# Patient Record
Sex: Female | Born: 1955 | Race: Black or African American | Hispanic: No | Marital: Married | Smoking: Current some day smoker
Health system: Southern US, Community
[De-identification: ages and names within clinical notes are randomized; demographics above are authoritative.]

## PROBLEM LIST (undated history)

## (undated) DIAGNOSIS — C801 Malignant (primary) neoplasm, unspecified: Secondary | ICD-10-CM

## (undated) DIAGNOSIS — C3490 Malignant neoplasm of unspecified part of unspecified bronchus or lung: Secondary | ICD-10-CM

## (undated) HISTORY — PX: ECTOPIC PREGNANCY SURGERY: SHX613

---

## 2004-07-10 ENCOUNTER — Emergency Department (HOSPITAL_COMMUNITY): Admission: EM | Admit: 2004-07-10 | Discharge: 2004-07-10 | Payer: Self-pay | Admitting: Emergency Medicine
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
## 2008-10-11 ENCOUNTER — Emergency Department (HOSPITAL_COMMUNITY): Admission: EM | Admit: 2008-10-11 | Discharge: 2008-10-11 | Payer: Self-pay | Admitting: Emergency Medicine

## 2008-10-13 ENCOUNTER — Emergency Department (HOSPITAL_COMMUNITY): Admission: EM | Admit: 2008-10-13 | Discharge: 2008-10-13 | Payer: Self-pay | Admitting: Emergency Medicine

## 2008-10-14 ENCOUNTER — Emergency Department (HOSPITAL_COMMUNITY): Admission: EM | Admit: 2008-10-14 | Discharge: 2008-10-14 | Payer: Self-pay | Admitting: Emergency Medicine

## 2010-03-28 ENCOUNTER — Emergency Department (HOSPITAL_COMMUNITY)
Admission: EM | Admit: 2010-03-28 | Discharge: 2010-03-28 | Payer: Self-pay | Source: Home / Self Care | Admitting: Emergency Medicine

## 2010-03-28 IMAGING — CR DG SHOULDER 2+V*L*
3 series · 3 of 3 positions shown · non-contrast
Comparison: None.

CLINICAL DATA: Left shoulder pain, fall

LEFT SHOULDER - 2+ VIEW

[view not recorded (1 of 3)]
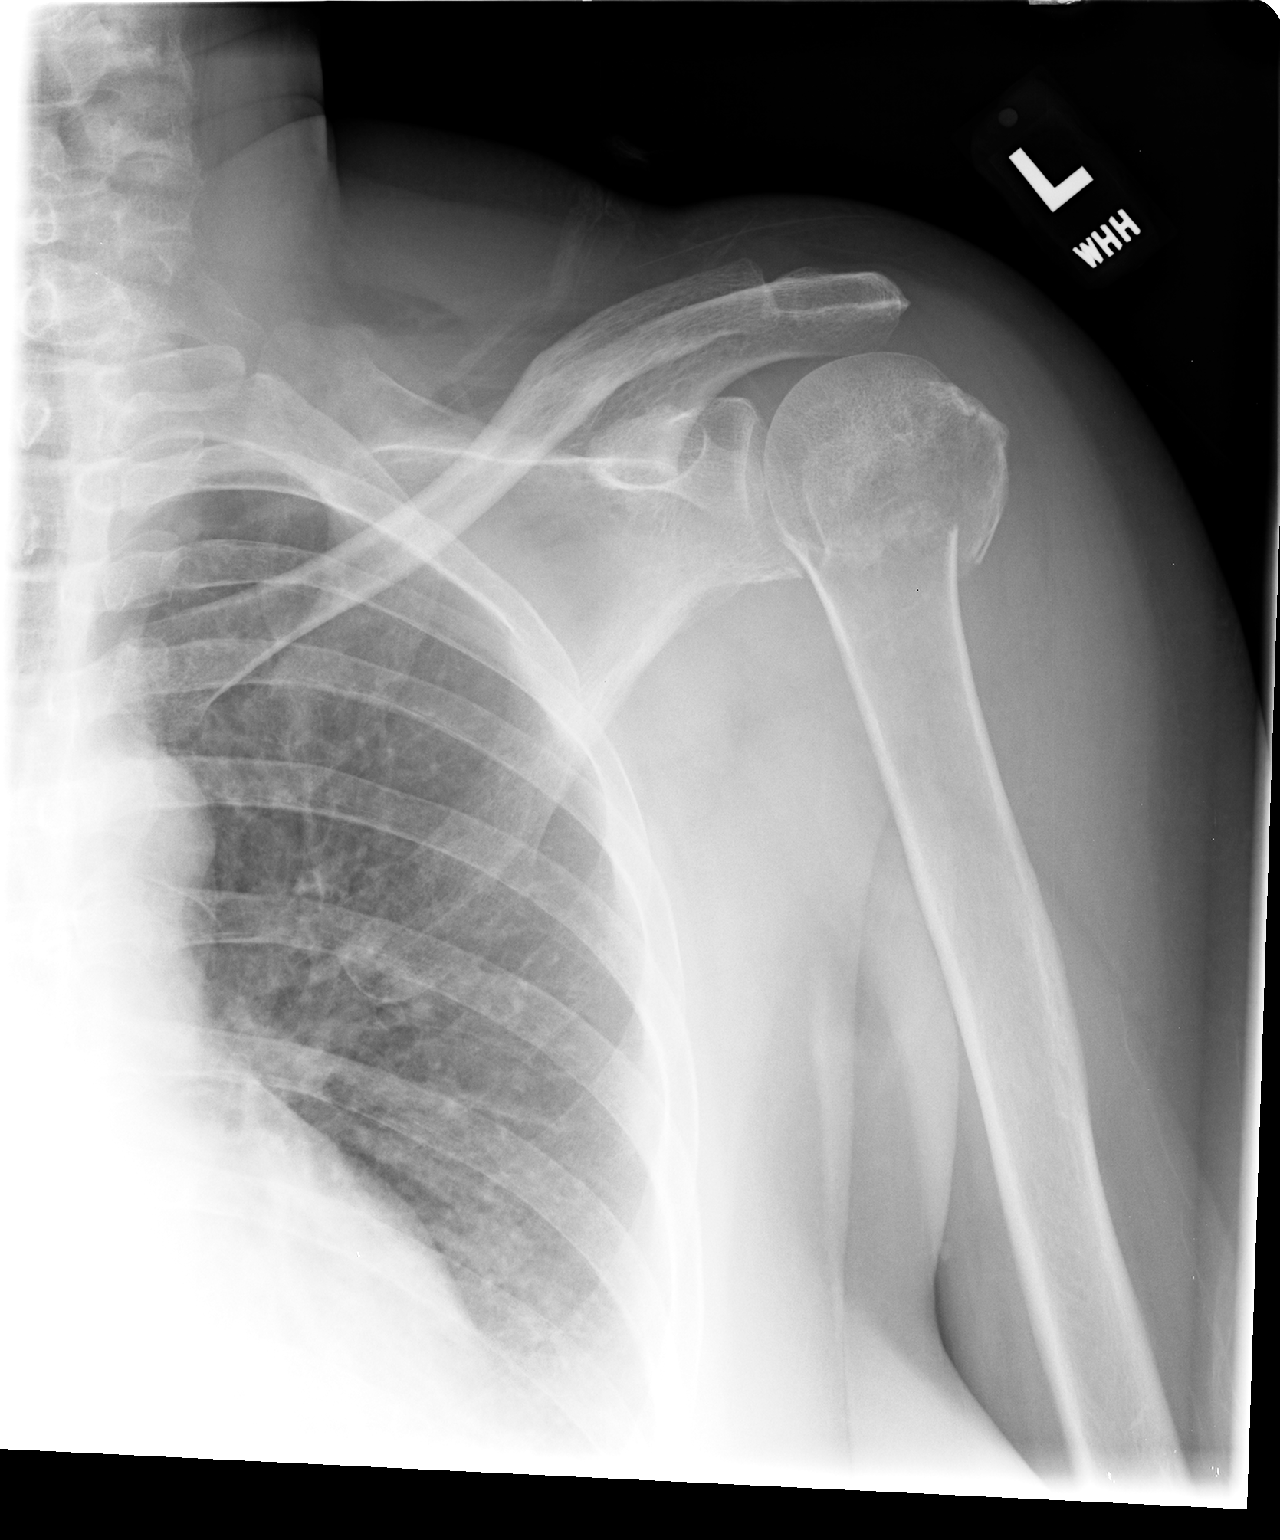

[view not recorded (2 of 3)]
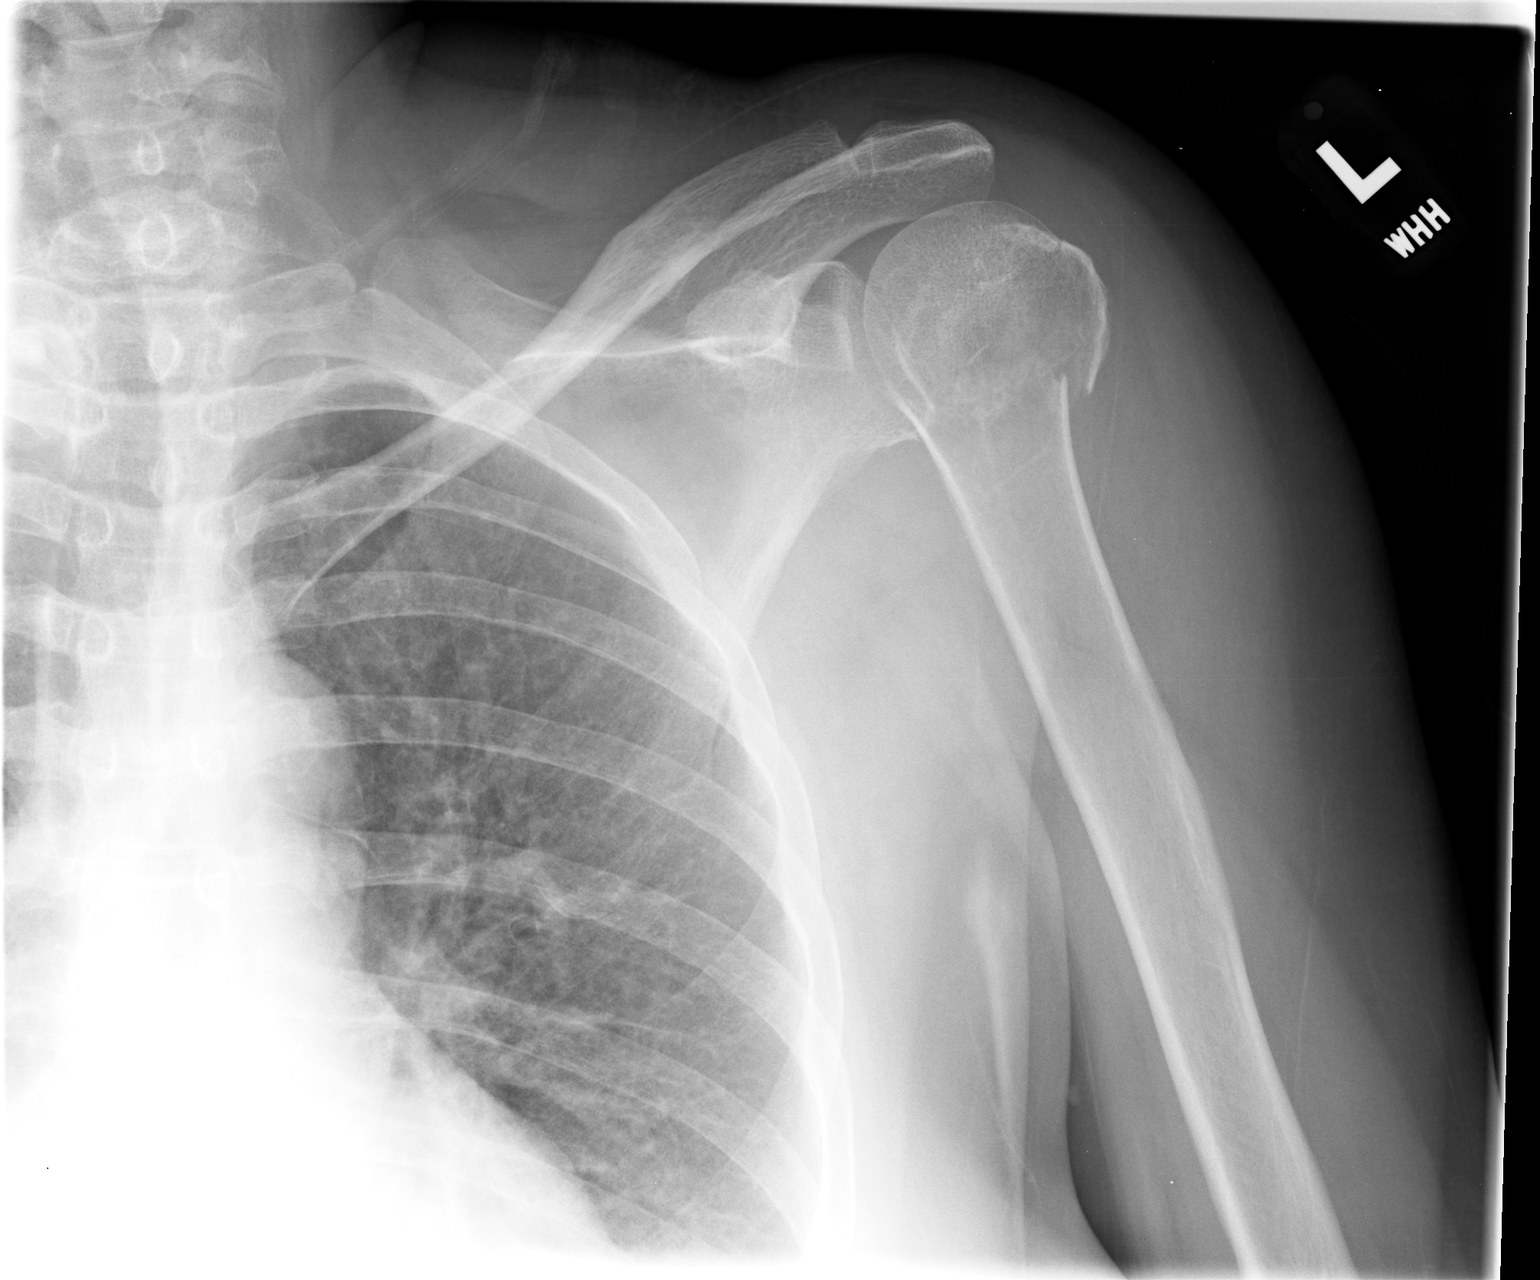

[view not recorded (3 of 3)]
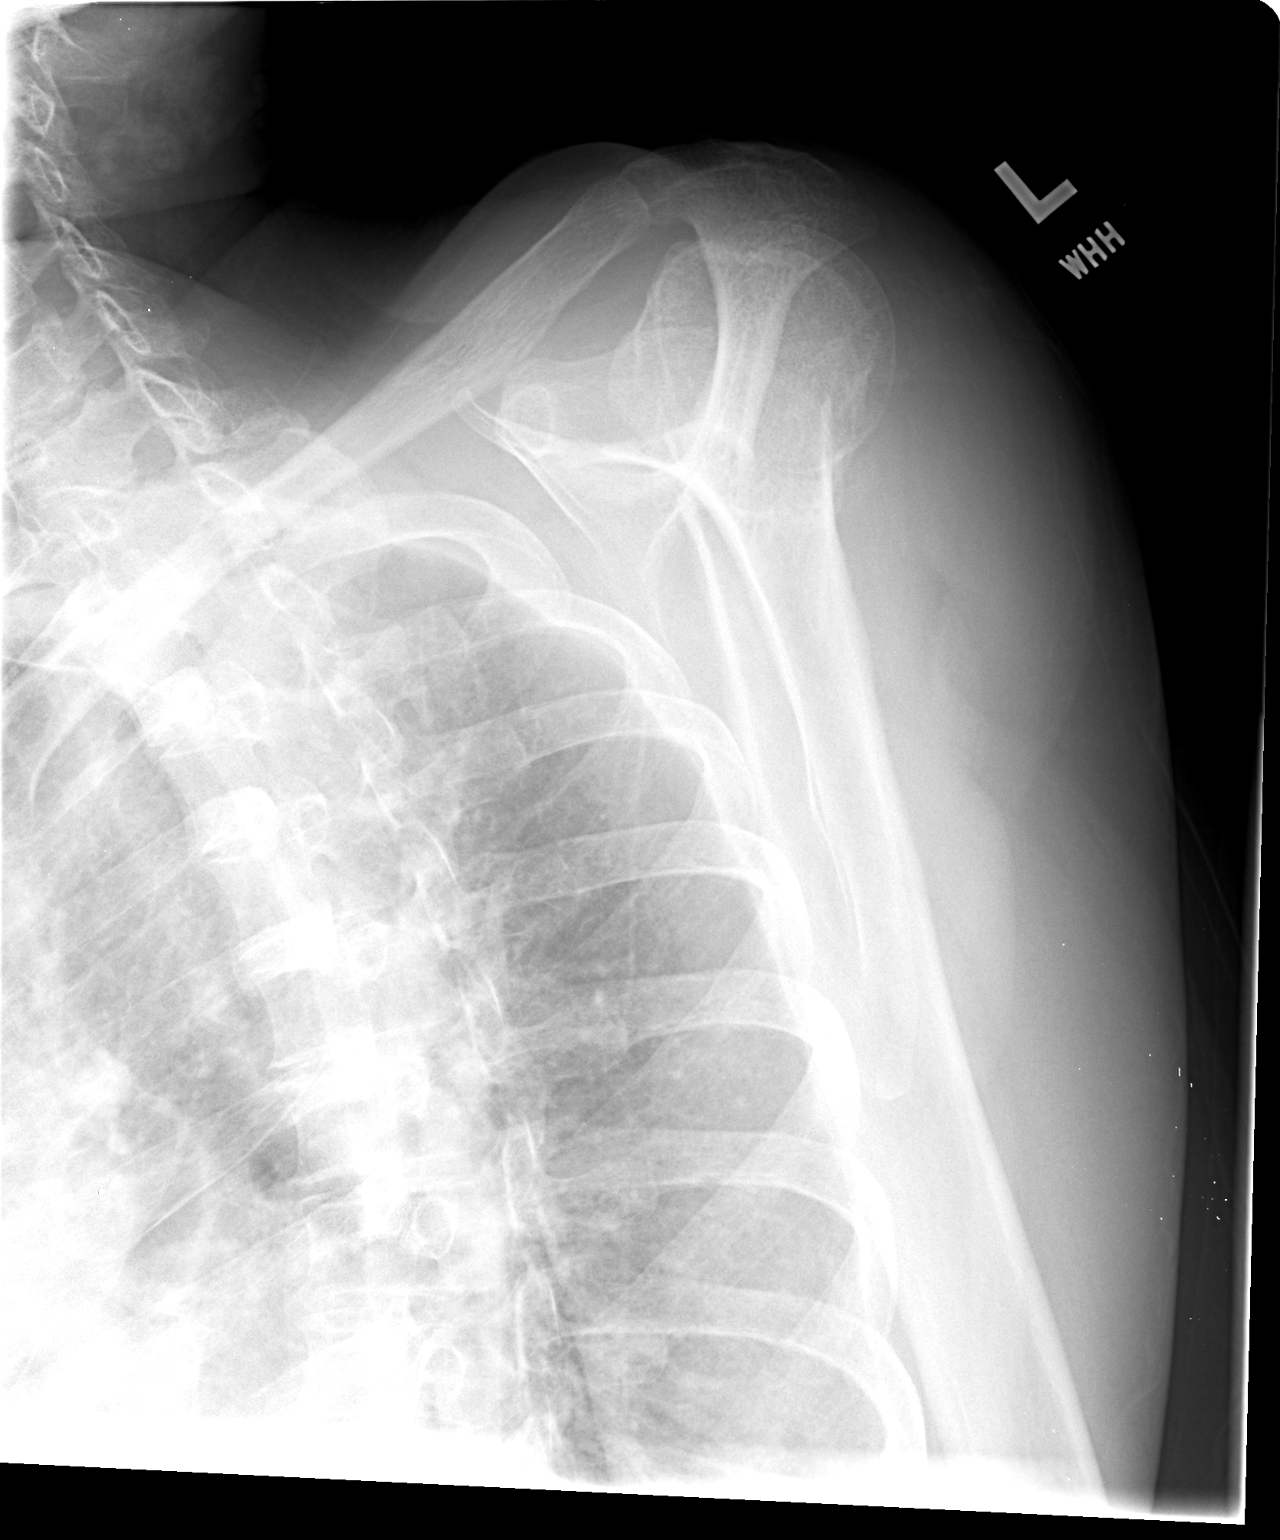

[3 of 3 positions shown; findings below may reference images not displayed]

FINDINGS: Left humeral neck fracture is noted without dislocation
or appreciable angulation.  Left AC joint distance is normal.
IMPRESSION: Left humeral neck fracture.

## 2010-03-30 ENCOUNTER — Emergency Department (HOSPITAL_COMMUNITY)
Admission: EM | Admit: 2010-03-30 | Discharge: 2010-03-30 | Payer: Self-pay | Source: Home / Self Care | Admitting: Emergency Medicine

## 2010-04-01 ENCOUNTER — Encounter: Payer: Self-pay | Admitting: Orthopedic Surgery

## 2010-04-01 ENCOUNTER — Ambulatory Visit
Admission: RE | Admit: 2010-04-01 | Discharge: 2010-04-01 | Payer: Self-pay | Source: Home / Self Care | Attending: Orthopedic Surgery | Admitting: Orthopedic Surgery

## 2010-04-01 DIAGNOSIS — S42309A Unspecified fracture of shaft of humerus, unspecified arm, initial encounter for closed fracture: Secondary | ICD-10-CM | POA: Insufficient documentation

## 2010-04-07 ENCOUNTER — Telehealth: Payer: Self-pay | Admitting: Orthopedic Surgery

## 2010-04-21 ENCOUNTER — Ambulatory Visit: Admit: 2010-04-21 | Payer: Self-pay | Admitting: Orthopedic Surgery

## 2010-04-28 ENCOUNTER — Telehealth: Payer: Self-pay | Admitting: Orthopedic Surgery

## 2010-04-29 ENCOUNTER — Other Ambulatory Visit: Payer: Self-pay | Admitting: Orthopedic Surgery

## 2010-04-29 ENCOUNTER — Encounter: Payer: Self-pay | Admitting: Orthopedic Surgery

## 2010-04-29 DIAGNOSIS — S42309A Unspecified fracture of shaft of humerus, unspecified arm, initial encounter for closed fracture: Secondary | ICD-10-CM

## 2010-04-30 NOTE — Progress Notes (Signed)
Summary: wants stronger pain med  Phone Note Call from Patient   Summary of Call: Sandra Horne ([id number]) says the Oxycodone 5/325 is not helping her pain.  She sometimes takes 2-3  and it relaxes her but does not completely stop the pain.  Wants you to prescribe something stronger.  Uses [facility] in [place] Her # [phone] Initial call taken by: Jacklynn Ganong,  April 07, 2010 11:46 AM  Follow-up for Phone Call        it can be changed but not increased  Follow-up by: Fuller Canada MD,  April 07, 2010 12:05 PM  Additional Follow-up for Phone Call Additional follow up Details #1::        She said she would continue taking what she has  if she cannot get something stronger Additional Follow-up by: Jacklynn Ganong,  April 07, 2010 12:23 PM
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
## 2010-04-30 NOTE — Letter (Signed)
Summary: History form  History form   Imported By: Jacklynn Ganong 04/02/2010 16:28:35  _____________________________________________________________________  External Attachment:    Type:   Image     Comment:   External Document

## 2010-05-06 NOTE — Miscellaneous (Signed)
Summary: PT order  Clinical Lists Changes  Orders: Added new Referral order of Physical Therapy Referral (PT) - Signed

## 2010-05-06 NOTE — Miscellaneous (Signed)
  Clinical Lists Changes  post frx week 4   left prox hum fracture   xrays 2 views left shoulder   Separate and Identifiable X-Ray report       healed proximal humerus fracture    Start PT   return in a month    [id number] value> Orders: Added new Service order of Post-Op Check [phone]) - Signed Added new Service order of Shoulder x-ray,  minimum 2 views (19147) - Signed

## 2010-05-06 NOTE — Progress Notes (Signed)
Summary: patient is re-scheduled for appt+approved for [facility] discount   Phone Note Call from Patient   Caller: Patient Summary of Call: Patient called and stopped in with letter from [facility] stating that she has been issued the 100% [place] discount per [facility] counseling dept, per Marisa Hua.  She requests re-schedule of her 04/01/10 appt, for which she did not come due to financial and transportation issues; had been advised to re-schedule asap as needs to fol/up and have new Xrays. She is re-scheduled for appointment;  letter scanned into chart. Initial call taken by: Cammie Sickle,  April 28, 2010 3:01 PM

## 2010-06-02 ENCOUNTER — Other Ambulatory Visit: Payer: Self-pay | Admitting: Orthopedic Surgery

## 2010-07-07 ENCOUNTER — Ambulatory Visit: Payer: Self-pay | Admitting: Orthopedic Surgery

## 2010-07-07 ENCOUNTER — Encounter: Payer: Self-pay | Admitting: Orthopedic Surgery

## 2010-08-03 ENCOUNTER — Emergency Department (HOSPITAL_COMMUNITY)
Admission: EM | Admit: 2010-08-03 | Discharge: 2010-08-03 | Disposition: A | Discharge status: Home / Self Care | Payer: Self-pay | Attending: Emergency Medicine | Admitting: Emergency Medicine

## 2010-08-03 ENCOUNTER — Emergency Department (HOSPITAL_COMMUNITY): Payer: Self-pay

## 2010-08-03 DIAGNOSIS — S42253A Displaced fracture of greater tuberosity of unspecified humerus, initial encounter for closed fracture: Secondary | ICD-10-CM | POA: Insufficient documentation

## 2010-08-03 DIAGNOSIS — Y93K1 Activity, walking an animal: Secondary | ICD-10-CM | POA: Insufficient documentation

## 2010-08-03 DIAGNOSIS — W010XXA Fall on same level from slipping, tripping and stumbling without subsequent striking against object, initial encounter: Secondary | ICD-10-CM | POA: Insufficient documentation

## 2012-04-23 ENCOUNTER — Emergency Department (HOSPITAL_COMMUNITY): Payer: No Typology Code available for payment source

## 2012-04-23 ENCOUNTER — Encounter (HOSPITAL_COMMUNITY): Payer: Self-pay | Admitting: *Deleted

## 2012-04-23 ENCOUNTER — Emergency Department (HOSPITAL_COMMUNITY)
Admission: EM | Admit: 2012-04-23 | Discharge: 2012-04-23 | Disposition: A | Discharge status: Home / Self Care | Payer: No Typology Code available for payment source | Attending: Emergency Medicine | Admitting: Emergency Medicine

## 2012-04-23 DIAGNOSIS — R55 Syncope and collapse: Secondary | ICD-10-CM | POA: Insufficient documentation

## 2012-04-23 DIAGNOSIS — S0003XA Contusion of scalp, initial encounter: Secondary | ICD-10-CM | POA: Insufficient documentation

## 2012-04-23 DIAGNOSIS — S00531A Contusion of lip, initial encounter: Secondary | ICD-10-CM

## 2012-04-23 DIAGNOSIS — S058X9A Other injuries of unspecified eye and orbit, initial encounter: Secondary | ICD-10-CM | POA: Insufficient documentation

## 2012-04-23 DIAGNOSIS — S0500XA Injury of conjunctiva and corneal abrasion without foreign body, unspecified eye, initial encounter: Secondary | ICD-10-CM

## 2012-04-23 DIAGNOSIS — Y9389 Activity, other specified: Secondary | ICD-10-CM | POA: Insufficient documentation

## 2012-04-23 DIAGNOSIS — R079 Chest pain, unspecified: Secondary | ICD-10-CM | POA: Insufficient documentation

## 2012-04-23 LAB — BASIC METABOLIC PANEL
BUN: 10 mg/dL (ref 6–23)
Calcium: 9.4 mg/dL (ref 8.4–10.5)
GFR calc Af Amer: >90 mL/min (ref >90)
GFR calc non Af Amer: >90 mL/min (ref >90)
Glucose, Bld: 108 mg/dL — ABNORMAL HIGH (ref 70–99)
Potassium: 3.9 mEq/L (ref 3.5–5.1)
Sodium: 137 mEq/L (ref 135–145)

## 2012-04-23 LAB — CBC WITH DIFFERENTIAL/PLATELET
Basophils Relative: 1 % (ref 0–1)
Eosinophils Absolute: 0.2 10*3/uL (ref 0.0–0.7)
Eosinophils Relative: 3 % (ref 0–5)
MCH: 30.2 pg (ref 26.0–34.0)
MCHC: 33.6 g/dL (ref 30.0–36.0)
MCV: 90 fL (ref 78.0–100.0)
Neutrophils Relative %: 48 % (ref 43–77)
Platelets: 372 10*3/uL (ref 150–400)
RDW: 14.5 % (ref 11.5–15.5)

## 2012-04-23 LAB — GLUCOSE, CAPILLARY: Glucose-Capillary: 105 mg/dL — ABNORMAL HIGH (ref 70–99)
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
## 2012-04-23 MED ORDER — FLUORESCEIN SODIUM 1 MG OP STRP
ORAL_STRIP | OPHTHALMIC | Status: AC
  Administered 2012-04-23: 20:00:00
  Filled 2012-04-23: qty 1

## 2012-04-23 MED ORDER — CIPROFLOXACIN HCL 0.3 % OP SOLN
2.0000 [drp] | OPHTHALMIC | Status: DC
  Filled 2012-04-23: qty 2.5

## 2012-04-23 MED ORDER — SODIUM CHLORIDE 0.9 % IV BOLUS (SEPSIS)
1000.0000 mL | Freq: Once | INTRAVENOUS | Status: AC
  Administered 2012-04-23: 1000 mL via INTRAVENOUS

## 2012-04-23 MED ORDER — TETRACAINE HCL 0.5 % OP SOLN
OPHTHALMIC | Status: AC
  Administered 2012-04-23: 2 [drp] via OPHTHALMIC
  Filled 2012-04-23: qty 2
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
## 2012-04-23 NOTE — ED Notes (Addendum)
Dr. Bernette Mayers walked to patient's room to talk to patient about ct results, patient not in room and no belongings in room. Patient had disconnected iv fluid tubing, but not iv was found in the room. Also looked in trash can in bathroom beside room and did not find iv. [place] PD notified per department policy, given patient's address and other necessary information on patient.

## 2012-04-23 NOTE — ED Notes (Signed)
Patient removing hospital gown and putting clothes on. Informed patient that tests results were not back yet, and that patient needed to give urine sample as ordered by doctor. Patient stated she had a sister at home to take care of and needed to leave soon. Verbalized understanding of tests results not back yet.

## 2012-08-09 ENCOUNTER — Emergency Department (HOSPITAL_COMMUNITY)
Admission: EM | Admit: 2012-08-09 | Discharge: 2012-08-09 | Disposition: A | Discharge status: Home / Self Care | Payer: Self-pay | Attending: Emergency Medicine | Admitting: Emergency Medicine

## 2012-08-09 ENCOUNTER — Encounter (HOSPITAL_COMMUNITY): Payer: Self-pay

## 2012-08-09 DIAGNOSIS — K089 Disorder of teeth and supporting structures, unspecified: Secondary | ICD-10-CM | POA: Insufficient documentation

## 2012-08-09 DIAGNOSIS — F17200 Nicotine dependence, unspecified, uncomplicated: Secondary | ICD-10-CM | POA: Insufficient documentation

## 2012-08-09 DIAGNOSIS — K029 Dental caries, unspecified: Secondary | ICD-10-CM | POA: Insufficient documentation

## 2012-08-09 MED ORDER — IBUPROFEN 800 MG PO TABS
800.0000 mg | ORAL_TABLET | Freq: Once | ORAL | Status: AC
  Administered 2012-08-09: 800 mg via ORAL
  Filled 2012-08-09: qty 1

## 2012-08-09 MED ORDER — ONDANSETRON HCL 4 MG PO TABS
4.0000 mg | ORAL_TABLET | Freq: Once | ORAL | Status: AC
  Administered 2012-08-09: 4 mg via ORAL
  Filled 2012-08-09: qty 1

## 2012-08-09 MED ORDER — ACETAMINOPHEN-CODEINE #3 300-30 MG PO TABS: 1.0000 | ORAL_TABLET | Freq: Four times a day (QID) | ORAL | Status: DC | PRN

## 2012-08-09 MED ORDER — AMOXICILLIN 500 MG PO CAPS: 500.0000 mg | ORAL_CAPSULE | Freq: Three times a day (TID) | ORAL | Status: DC

## 2012-08-09 MED ORDER — CLINDAMYCIN HCL 150 MG PO CAPS
300.0000 mg | ORAL_CAPSULE | Freq: Once | ORAL | Status: AC
  Administered 2012-08-09: 300 mg via ORAL
  Filled 2012-08-09: qty 2

## 2012-08-09 NOTE — ED Provider Notes (Signed)
History     CSN: [id number]  Arrival date & time 08/09/12  1429   First MD Initiated Contact with Patient 08/09/12 1519      Chief Complaint  Patient presents with  . Dental Pain    (Consider location/radiation/quality/duration/timing/severity/associated sxs/prior treatment) Patient is a 57 y.o. female presenting with tooth pain. The history is provided by the patient.  Dental PainThe primary symptoms include mouth pain. Primary symptoms do not include shortness of breath or cough. The symptoms began 3 to 5 days ago. The symptoms are worsening. The symptoms occur constantly.  Additional symptoms include: gum swelling and jaw pain. Additional symptoms do not include: trouble swallowing and nosebleeds. Medical issues include: smoking.    History reviewed. No pertinent past medical history.  Past Surgical History  Procedure Laterality Date  . Ectopic pregnancy surgery      Family History  Problem Relation Age of Onset  . Cancer      FH     History  Substance Use Topics  . Smoking status: Current Every Day Smoker -- 1.00 packs/day    Types: Cigarettes  . Smokeless tobacco: Not on file  . Alcohol Use: No    OB History   Grav Para Term Preterm Abortions TAB SAB Ect Mult Living                  Review of Systems  Constitutional: Negative for activity change.       All ROS Neg except as noted in HPI  HENT: Positive for dental problem. Negative for nosebleeds, trouble swallowing and neck pain.   Eyes: Negative for photophobia and discharge.  Respiratory: Negative for cough, shortness of breath and wheezing.   Cardiovascular: Negative for chest pain and palpitations.  Gastrointestinal: Negative for abdominal pain and blood in stool.  Genitourinary: Negative for dysuria, frequency and hematuria.  Musculoskeletal: Negative for back pain and arthralgias.  Skin: Negative.   Neurological: Negative for dizziness, seizures and speech difficulty.  Psychiatric/Behavioral:  Negative for hallucinations and confusion.    Allergies  Review of patient's allergies indicates no known allergies.  Home Medications   Current Outpatient Rx  Name  Route  Sig  Dispense  Refill  . acetaminophen (TYLENOL) 500 MG tablet   Oral   Take 1,000 mg by mouth every 6 (six) hours as needed for pain (toothiache).         . Ibuprofen (ADVIL PO)   Oral   Take 2 tablets by mouth daily as needed (toothache).         Marland Kitchen acetaminophen-codeine (TYLENOL #3) 300-30 MG per tablet   Oral   Take 1-2 tablets by mouth every 6 (six) hours as needed.   24 tablet   0   . amoxicillin (AMOXIL) 500 MG capsule   Oral   Take 1 capsule (500 mg total) by mouth 3 (three) times daily.   21 capsule   0     BP 119/76  Pulse 87  Temp(Src) 98.1 F (36.7 C) (Oral)  Resp 18  Ht 5\' 4"  (1.626 m)  Wt 176 lb (79.833 kg)  BMI 30.2 kg/m2  SpO2 99%  Physical Exam  Nursing note and vitals reviewed. Constitutional: She is oriented to person, place, and time. She appears well-developed and well-nourished.  Non-toxic appearance.  HENT:  Head: Normocephalic.  Right Ear: Tympanic membrane and external ear normal.  Left Ear: Tympanic membrane and external ear normal.  Patient has multiple dental caries on the right and left jaws.  There are some of the decaying areas that have decayed down to the gum line. There is mild-to-moderate swelling of the gums. The airway is patent. There is no swelling under the tongue.  Eyes: EOM and lids are normal. Pupils are equal, round, and reactive to light.  Neck: Normal range of motion. Neck supple. Carotid bruit is not present.  Cardiovascular: Normal rate, regular rhythm, normal heart sounds, intact distal pulses and normal pulses.   Pulmonary/Chest: Breath sounds normal. No respiratory distress.  Abdominal: Soft. Bowel sounds are normal. There is no tenderness. There is no guarding.  Musculoskeletal: Normal range of motion.  Lymphadenopathy:       Head (right  side): No submandibular adenopathy present.       Head (left side): No submandibular adenopathy present.    She has no cervical adenopathy.  Neurological: She is alert and oriented to person, place, and time. She has normal strength. No cranial nerve deficit or sensory deficit.  Skin: Skin is warm and dry.  Psychiatric: She has a normal mood and affect. Her speech is normal.    ED Course  Procedures (including critical care time)  Labs Reviewed - No data to display No results found.   1. Pain due to dental caries       MDM  I have reviewed nursing notes, vital signs, and all appropriate lab and imaging results for this patient. Patient has multiple dental caries. She has pain on the right lower jaw with significant swelling around the gum. There is no visible abscess noted. Vital signs are essentially stable. The plan at this time is for the patient to see a dentist systems possible. Prescription for Tylenol with codeine #3 one or 2 every 6 hours as needed for pain and 500 mg 3 times daily been given to the patient.       Kathie Dike, PA-C 08/09/12 1623

## 2012-08-09 NOTE — ED Notes (Signed)
Pt reports right lower toothache for 3 days, now having swelling

## 2012-08-10 NOTE — ED Provider Notes (Signed)
Medical screening examination/treatment/procedure(s) were performed by non-physician practitioner and as supervising physician I was immediately available for consultation/collaboration.  Mariko Nowakowski, MD 08/10/12 1714 

## 2013-07-11 ENCOUNTER — Emergency Department (HOSPITAL_COMMUNITY)
Admission: EM | Admit: 2013-07-11 | Discharge: 2013-07-11 | Disposition: A | Discharge status: Home / Self Care | Payer: Self-pay | Attending: Emergency Medicine | Admitting: Emergency Medicine

## 2013-07-11 ENCOUNTER — Emergency Department (HOSPITAL_COMMUNITY): Payer: Self-pay

## 2013-07-11 ENCOUNTER — Encounter (HOSPITAL_COMMUNITY): Payer: Self-pay | Admitting: Emergency Medicine

## 2013-07-11 DIAGNOSIS — M1712 Unilateral primary osteoarthritis, left knee: Secondary | ICD-10-CM

## 2013-07-11 DIAGNOSIS — IMO0002 Reserved for concepts with insufficient information to code with codable children: Secondary | ICD-10-CM | POA: Insufficient documentation

## 2013-07-11 DIAGNOSIS — F17200 Nicotine dependence, unspecified, uncomplicated: Secondary | ICD-10-CM | POA: Insufficient documentation

## 2013-07-11 DIAGNOSIS — M1710 Unilateral primary osteoarthritis, unspecified knee: Secondary | ICD-10-CM | POA: Insufficient documentation

## 2013-07-11 IMAGING — CR DG KNEE COMPLETE 4+V*L*
4 series · 4 of 4 positions shown · non-contrast
Comparison: None.

CLINICAL DATA: Pain

EXAM:
LEFT KNEE - COMPLETE 4+ VIEW

[view not recorded (1 of 4)]
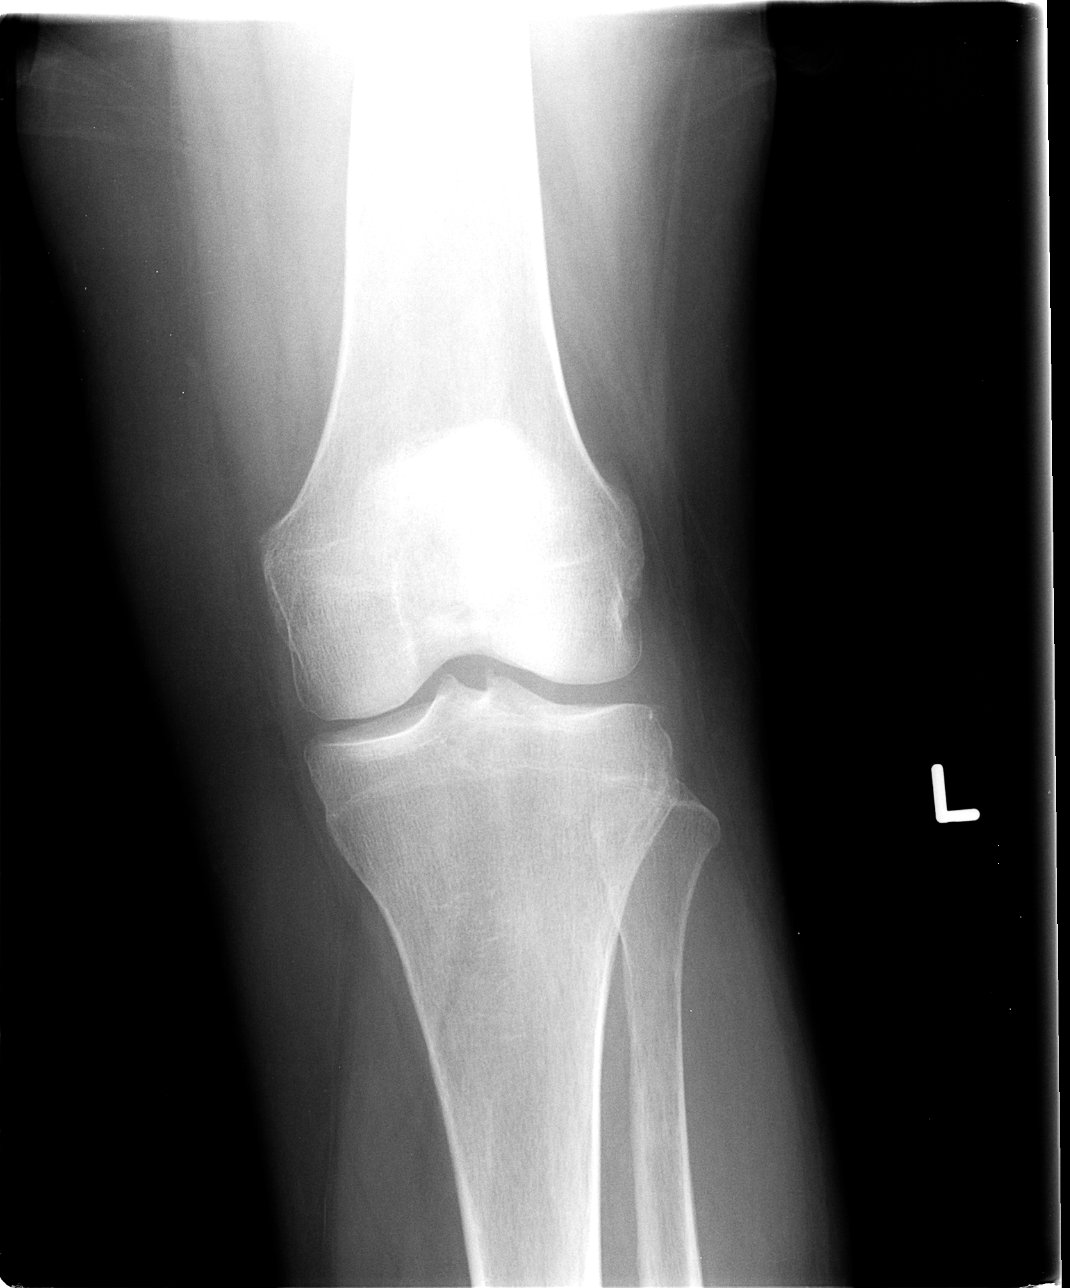

[view not recorded (2 of 4)]
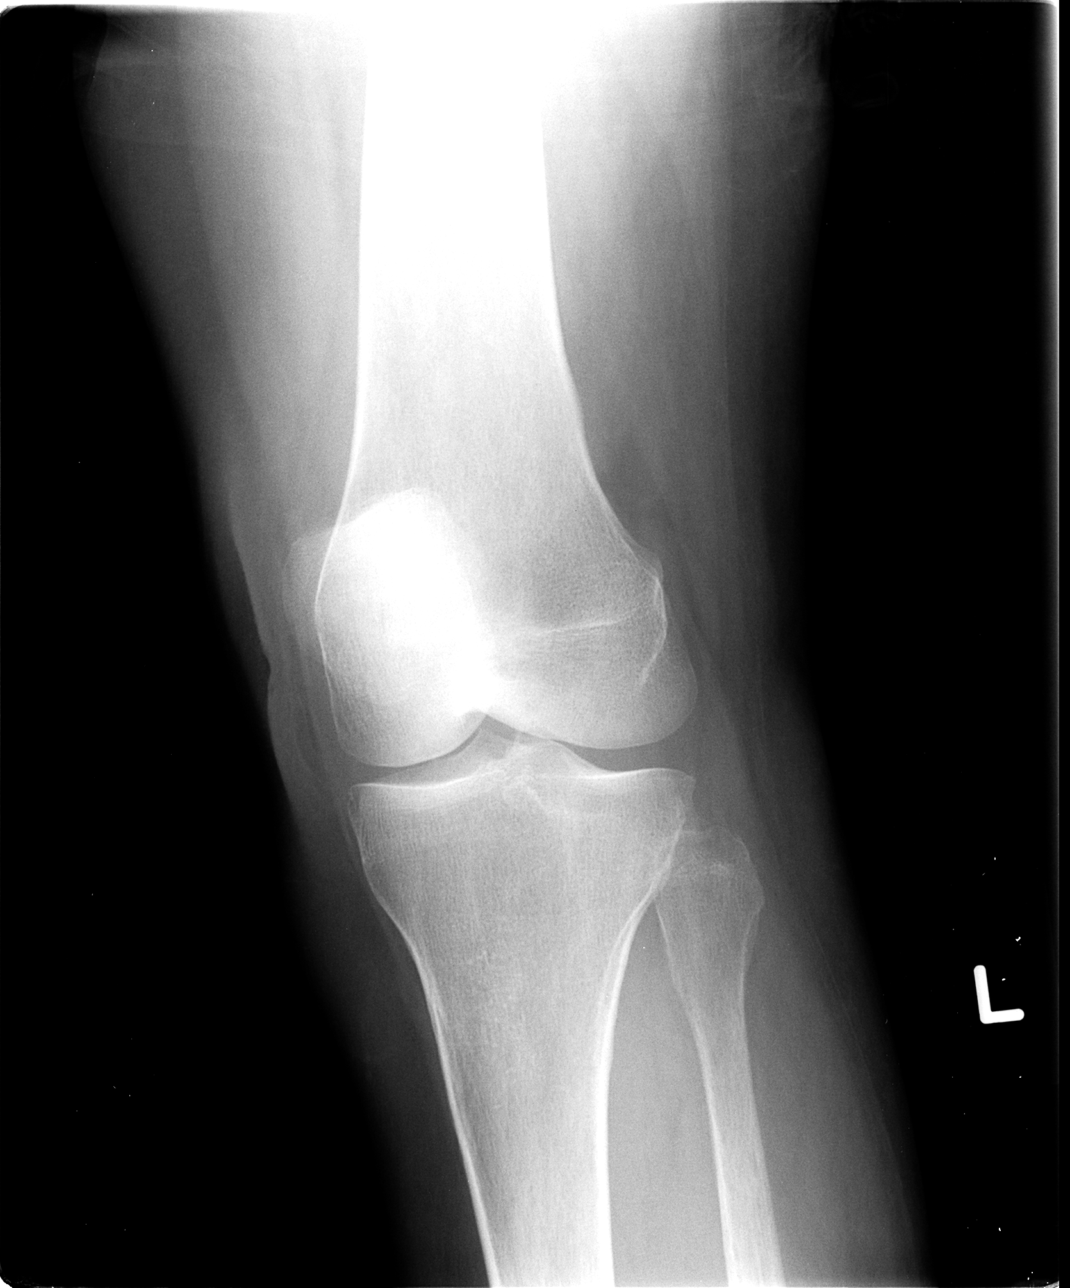

[view not recorded (3 of 4)]
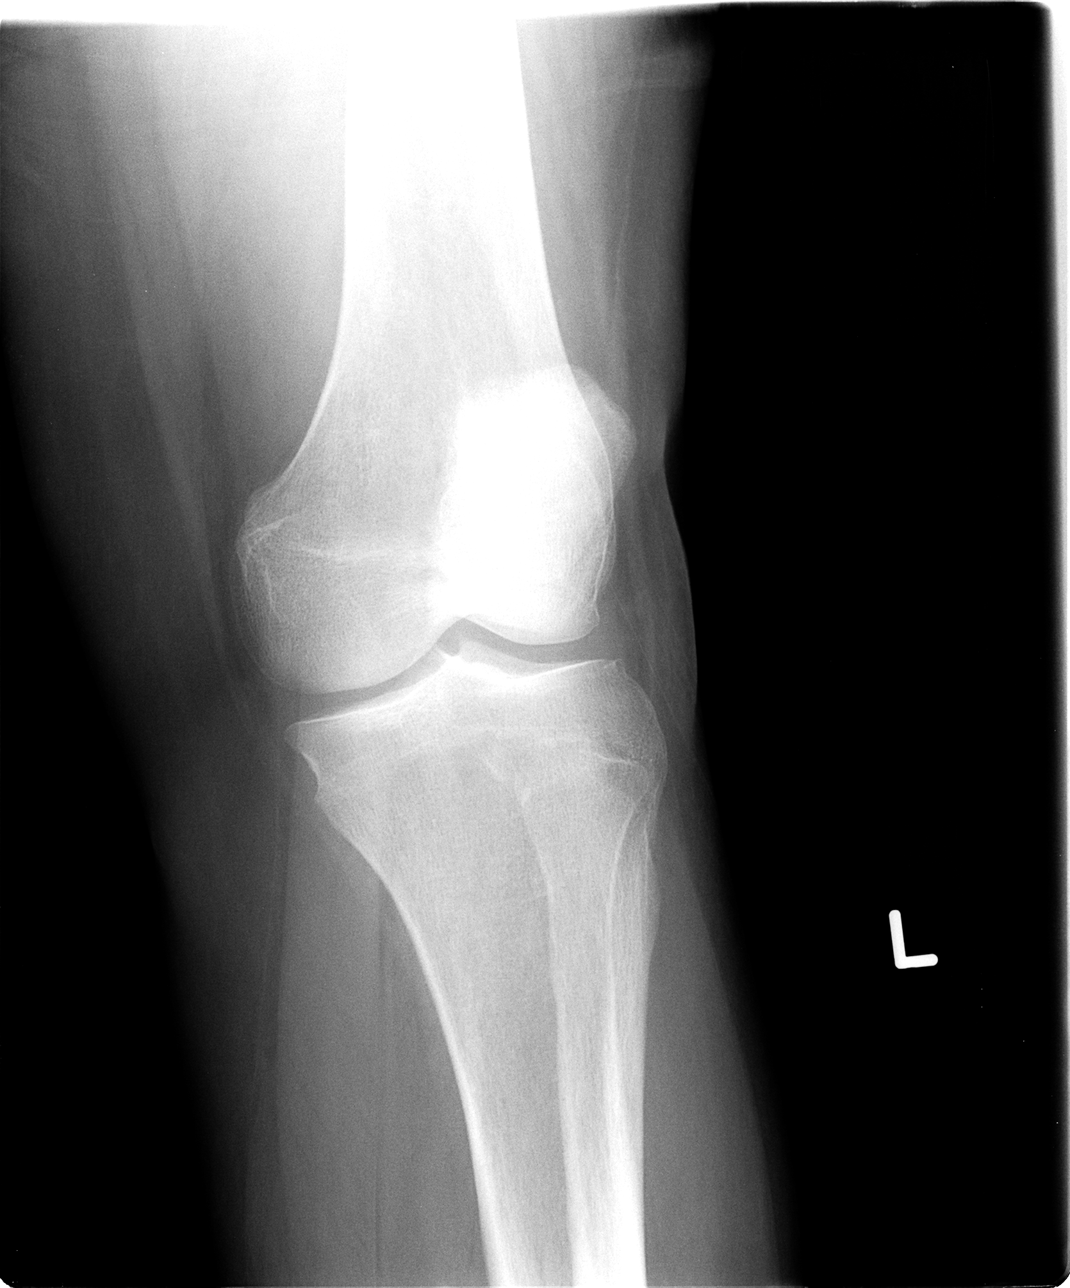

[view not recorded (4 of 4)]
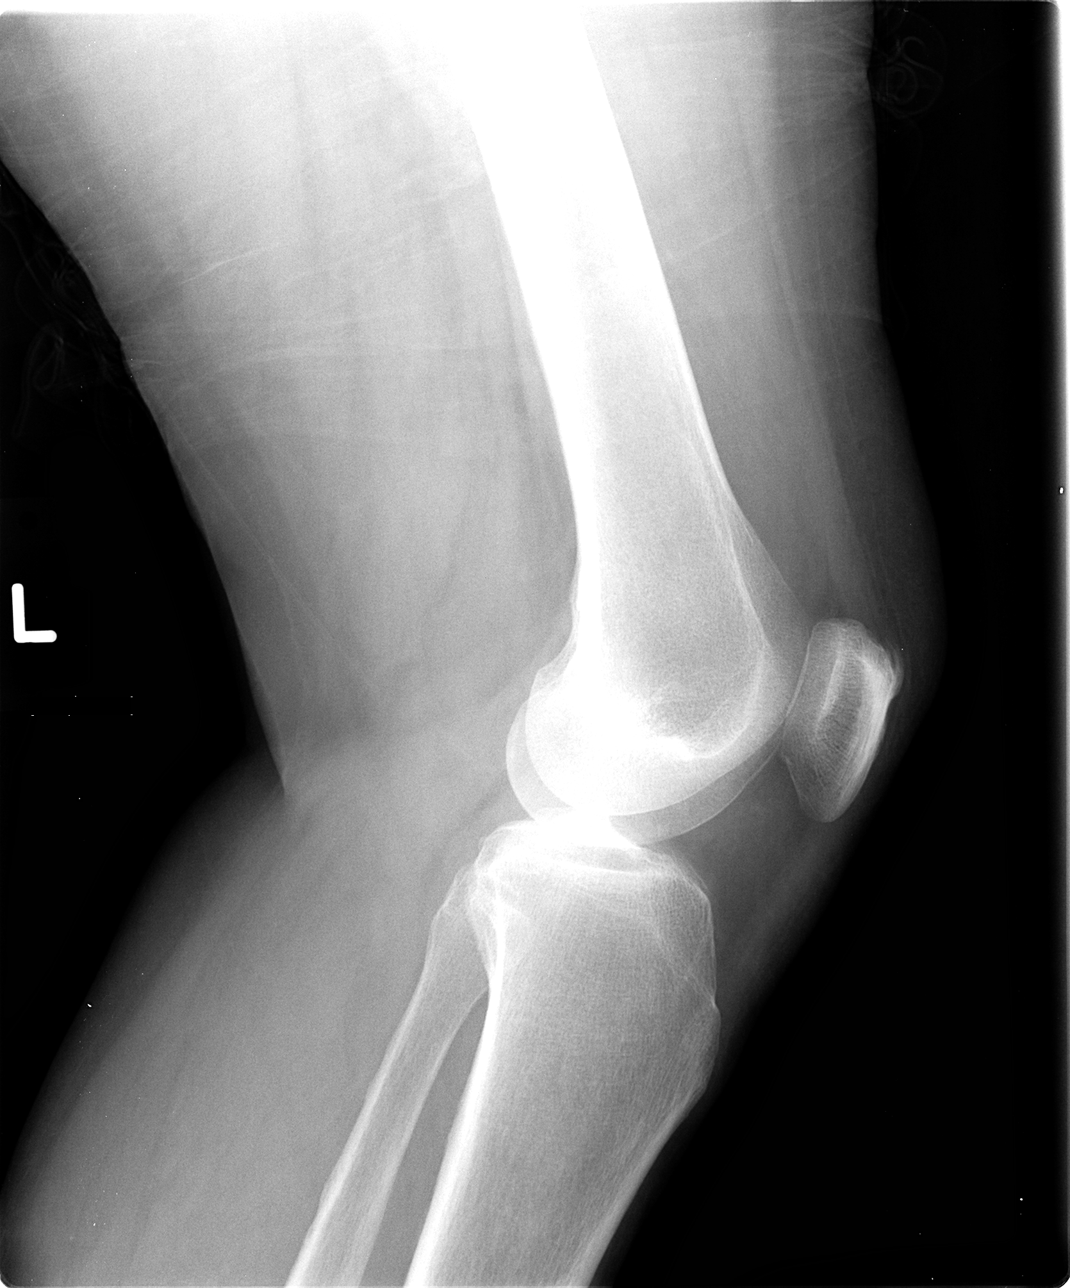

[4 of 4 positions shown; findings below may reference images not displayed]

FINDINGS: Frontal, lateral, and bilateral oblique views were obtained. There
is no fracture, dislocation, or effusion. Joint spaces appear
intact. There is a small spur arising from the anterior superior
patella. No erosive change.
IMPRESSION: Small anterior superior patellar spur.  No fracture or effusion.

## 2013-07-11 MED ORDER — PREDNISONE 10 MG PO TABS
60.0000 mg | ORAL_TABLET | Freq: Once | ORAL | Status: AC
  Administered 2013-07-11: 60 mg via ORAL
  Filled 2013-07-11 (×2): qty 1

## 2013-07-11 MED ORDER — ONDANSETRON HCL 4 MG PO TABS
4.0000 mg | ORAL_TABLET | Freq: Once | ORAL | Status: AC
  Administered 2013-07-11: 4 mg via ORAL
  Filled 2013-07-11: qty 1

## 2013-07-11 MED ORDER — DICLOFENAC SODIUM 75 MG PO TBEC: 75.0000 mg | DELAYED_RELEASE_TABLET | Freq: Two times a day (BID) | ORAL | Status: DC

## 2013-07-11 MED ORDER — DEXAMETHASONE 4 MG PO TABS: ORAL_TABLET | ORAL | Status: DC

## 2013-07-11 MED ORDER — KETOROLAC TROMETHAMINE 10 MG PO TABS
10.0000 mg | ORAL_TABLET | Freq: Once | ORAL | Status: AC
  Administered 2013-07-11: 10 mg via ORAL
  Filled 2013-07-11: qty 1

## 2013-07-11 NOTE — ED Provider Notes (Signed)
Medical screening examination/treatment/procedure(s) were conducted as a shared visit with non-physician practitioner(s) and myself.  I personally evaluated the patient during the encounter.   EKG Interpretation None     X-ray left knee shows no acute findings.  Nat Christen, MD 07/11/13 [phone]

## 2013-07-11 NOTE — Discharge Instructions (Signed)
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 Please use the knee immobilizer for the next 7 or 8 days. You do not have to sleep in this device. Please see the orthopedic specialist listed above, or the orthopedist of your choice for additional followup and management of this problem. Please use Decadron and diclofenac 2 times daily with food.  Osteoarthritis Osteoarthritis is a disease that causes soreness and swelling (inflammation) of a joint. It occurs when the cartilage at the affected joint wears down. Cartilage acts as a cushion, covering the ends of bones where they meet to form a joint. Osteoarthritis is the most common form of arthritis. It often occurs in older people. The joints affected most often by this condition include those in the:  Ends of the fingers.  Thumbs.  Neck.  Lower back.  Knees.  Hips. CAUSES  Over time, the cartilage that covers the ends of bones begins to wear away. This causes bone to rub on bone, producing pain and stiffness in the affected joints.  RISK FACTORS Certain factors can increase your chances of having osteoarthritis, including:  Older age.  Excessive body weight.  Overuse of joints. SIGNS AND SYMPTOMS   Pain, swelling, and stiffness in the joint.  Over time, the joint may lose its normal shape.  Small deposits of bone (osteophytes) may grow on the edges of the joint.  Bits of bone or cartilage can break off and float inside the joint space. This may cause more pain and damage. DIAGNOSIS  Your health care provider will do a physical exam and ask about your symptoms. Various tests may be ordered, such as:  X-rays of the affected joint.  An MRI scan.  Blood tests to rule out other types of arthritis.  Joint fluid tests. This involves using a needle to draw fluid from the joint and examining the fluid under a microscope. TREATMENT  Goals of treatment are to control pain and improve joint function. Treatment plans may  include:  A prescribed exercise program that allows for rest and joint relief.  A weight control plan.  Pain relief techniques, such as:  Properly applied heat and cold.  Electric pulses delivered to nerve endings under the skin (transcutaneous electrical nerve stimulation, TENS).  Massage.  Certain nutritional supplements.  Medicines to control pain, such as:  Acetaminophen.  Nonsteroidal anti-inflammatory drugs (NSAIDs), such as naproxen.  Narcotic or central-acting agents, such as tramadol.  Corticosteroids. These can be given orally or as an injection.  Surgery to reposition the bones and relieve pain (osteotomy) or to remove loose pieces of bone and cartilage. Joint replacement may be needed in advanced states of osteoarthritis. HOME CARE INSTRUCTIONS   Only take over-the-counter or prescription medicines as directed by your health care provider. Take all medicines exactly as instructed.  Maintain a healthy weight. Follow your health care provider's instructions for weight control. This may include dietary instructions.  Exercise as directed. Your health care provider can recommend specific types of exercise. These may include:  Strengthening exercises These are done to strengthen the muscles that support joints affected by arthritis. They can be performed with weights or with exercise bands to add resistance.  Aerobic activities These are exercises, such as brisk walking or low-impact aerobics, that get your heart pumping.  Range-of-motion activities These keep your joints limber.  Balance and agility exercises These help you maintain daily living skills.  Rest your affected joints as directed by your health care provider.  Follow up with your  health care provider as directed. SEEK MEDICAL CARE IF:   Your skin turns red.  You develop a rash in addition to your joint pain.  You have worsening joint pain. SEEK IMMEDIATE MEDICAL CARE IF:  You have a  significant loss of weight or appetite.  You have a fever along with joint or muscle aches.  You have night sweats. [place] of Arthritis and Musculoskeletal and Skin Diseases: www.niams.SouthExposed.es [employer] on Aging: http://kim-miller.com/ American College of Rheumatology: www.rheumatology.org Document Released: 03/15/2005 Document Revised: 01/03/2013 Document Reviewed: 11/20/2012 [facility] Patient Information 2014 [place], [place].

## 2013-07-11 NOTE — ED Notes (Signed)
Pt states lt knee just started hurting at 0100, denies injury, unable to tolerate weight per pt.

## 2013-07-11 NOTE — ED Provider Notes (Signed)
CSN: [id number]     Arrival date & time 07/11/13  1025 History   First MD Initiated Contact with Patient 07/11/13 0845     Chief Complaint  Patient presents with  . Knee Pain    left     (Consider location/radiation/quality/duration/timing/severity/associated sxs/prior Treatment) HPI Comments: Patient is a 58 year old female who presents to the emergency department with left knee pain. This problem started early this morning, about 1 AM. 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 She also does a lot of lifting pushing and pulling. She has not had previous injury or operation or procedure involving the knees. She has not noted a great deal of swelling, but did notice pain when applying weight to her left knee. She tried Tylenol to assist with her discomfort, but this was unsuccessful.  The history is provided by the patient.    History reviewed. No pertinent past medical history. Past Surgical History  Procedure Laterality Date  . Ectopic pregnancy surgery     Family History  Problem Relation Age of Onset  . Cancer      FH    History  Substance Use Topics  . Smoking status: Current Every Day Smoker -- 1.00 packs/day    Types: Cigarettes  . Smokeless tobacco: Not on file  . Alcohol Use: No   OB History   Grav Para Term Preterm Abortions TAB SAB Ect Mult Living                 Review of Systems  Constitutional: Negative for activity change.       All ROS Neg except as noted in HPI  HENT: Negative for nosebleeds.   Eyes: Negative for photophobia and discharge.  Respiratory: Negative for cough, shortness of breath and wheezing.   Cardiovascular: Negative for chest pain and palpitations.  Gastrointestinal: Negative for abdominal pain and blood in stool.  Genitourinary: Negative for dysuria, frequency and hematuria.  Musculoskeletal: Positive for arthralgias. Negative for back pain and neck pain.  Skin: Negative.    Neurological: Negative for dizziness, seizures and speech difficulty.  Psychiatric/Behavioral: Negative for hallucinations and confusion.      Allergies  Review of patient's allergies indicates no known allergies.  Home Medications   Prior to Admission medications   Medication Sig Start Date End Date Taking? Authorizing Provider  acetaminophen (TYLENOL) 500 MG tablet Take 1,000 mg by mouth every 6 (six) hours as needed. pain   Yes Historical Provider, MD   BP 138/74  Pulse 88  Temp(Src) 97.9 F (36.6 C) (Oral)  Resp 18  SpO2 99% Physical Exam  Nursing note and vitals reviewed. Constitutional: She is oriented to person, place, and time. She appears well-developed and well-nourished.  Non-toxic appearance.  HENT:  Head: Normocephalic.  Right Ear: Tympanic membrane and external ear normal.  Left Ear: Tympanic membrane and external ear normal.  Eyes: EOM and lids are normal. Pupils are equal, round, and reactive to light.  Neck: Normal range of motion. Neck supple. Carotid bruit is not present.  Cardiovascular: Normal rate, regular rhythm, normal heart sounds, intact distal pulses and normal pulses.   Pulmonary/Chest: Breath sounds normal. No respiratory distress.  Abdominal: Soft. Bowel sounds are normal. There is no tenderness. There is no guarding.  Musculoskeletal: Normal range of motion.  There is full range of motion of the left hip. There is mild crepitus with range of motion of the left knee. There is no  effusion present. No posterior mass appreciated. There is no pain in the left calf, or swelling in the left calf. The Achilles tendon is intact. Dorsalis pedis and posterior tibial pulses are 2+ bilaterally.  Lymphadenopathy:       Head (right side): No submandibular adenopathy present.       Head (left side): No submandibular adenopathy present.    She has no cervical adenopathy.  Neurological: She is alert and oriented to person, place, and time. She has normal  strength. No cranial nerve deficit or sensory deficit.  Skin: Skin is warm and dry.  Psychiatric: She has a normal mood and affect. Her speech is normal.    ED Course  Procedures (including critical care time) Labs Review Labs Reviewed - No data to display  Imaging Review Dg Knee Complete 4 Views Left  07/11/2013   CLINICAL DATA:  Pain  EXAM: LEFT KNEE - COMPLETE 4+ VIEW  COMPARISON:  None.  FINDINGS: Frontal, lateral, and bilateral oblique views were obtained. There is no fracture, dislocation, or effusion. Joint spaces appear intact. There is a small spur arising from the anterior superior patella. No erosive change.  IMPRESSION: Small anterior superior patellar spur.  No fracture or effusion.   Electronically Signed   By: Lowella Grip M.D.   On: 07/11/2013 10:35     EKG Interpretation None      MDM X-ray of the left knee is negative for fracture, or dislocation. There is evidence of arthritis including an anterior superior patella spur being present. These findings have been presented to the patient in terms which he understands.  The patient is fitted with a knee immobilizer. Prescription for Decadron and an ALT parent given to the patient. Patient is referred to orthopedics for additional evaluation and management.    Final diagnoses:  None    **I have reviewed nursing notes, vital signs, and all appropriate lab and imaging results for this patient.    Lenox Ahr, PA-C 07/11/13 1119

## 2019-12-13 ENCOUNTER — Other Ambulatory Visit: Payer: Self-pay

## 2019-12-13 ENCOUNTER — Emergency Department (HOSPITAL_COMMUNITY)
Admission: EM | Admit: 2019-12-13 | Discharge: 2019-12-13 | Disposition: A | Discharge status: Home / Self Care | Payer: Self-pay | Attending: Emergency Medicine | Admitting: Emergency Medicine

## 2019-12-13 ENCOUNTER — Encounter (HOSPITAL_COMMUNITY): Payer: Self-pay | Admitting: Emergency Medicine

## 2019-12-13 ENCOUNTER — Emergency Department (HOSPITAL_COMMUNITY): Payer: Self-pay

## 2019-12-13 DIAGNOSIS — F17210 Nicotine dependence, cigarettes, uncomplicated: Secondary | ICD-10-CM | POA: Insufficient documentation

## 2019-12-13 DIAGNOSIS — C3490 Malignant neoplasm of unspecified part of unspecified bronchus or lung: Secondary | ICD-10-CM | POA: Insufficient documentation

## 2019-12-13 DIAGNOSIS — M545 Low back pain: Secondary | ICD-10-CM | POA: Insufficient documentation

## 2019-12-13 DIAGNOSIS — Z720 Tobacco use: Secondary | ICD-10-CM

## 2019-12-13 DIAGNOSIS — M549 Dorsalgia, unspecified: Secondary | ICD-10-CM

## 2019-12-13 LAB — CBC WITH DIFFERENTIAL/PLATELET
Abs Immature Granulocytes: 0.05 10*3/uL (ref 0.00–0.07)
Basophils Absolute: 0.1 10*3/uL (ref 0.0–0.1)
Basophils Relative: 1 %
Eosinophils Absolute: 0.3 10*3/uL (ref 0.0–0.5)
Eosinophils Relative: 3 %
HCT: 40.3 % (ref 36.0–46.0)
Hemoglobin: 12.5 g/dL (ref 12.0–15.0)
Immature Granulocytes: 1 %
Lymphocytes Relative: 21 %
Lymphs Abs: 1.9 10*3/uL (ref 0.7–4.0)
MCH: 28.7 pg (ref 26.0–34.0)
MCHC: 31 g/dL (ref 30.0–36.0)
MCV: 92.6 fL (ref 80.0–100.0)
Monocytes Absolute: 1 10*3/uL (ref 0.1–1.0)
Monocytes Relative: 10 %
Neutro Abs: 6 10*3/uL (ref 1.7–7.7)
Neutrophils Relative %: 64 %
Platelets: 394 10*3/uL (ref 150–400)
RBC: 4.35 MIL/uL (ref 3.87–5.11)
RDW: 14.7 % (ref 11.5–15.5)
WBC: 9.3 10*3/uL (ref 4.0–10.5)
nRBC: 0 % (ref 0.0–0.2)

## 2019-12-13 LAB — URINALYSIS, ROUTINE W REFLEX MICROSCOPIC
Bilirubin Urine: NEGATIVE
Glucose, UA: NEGATIVE mg/dL
Ketones, ur: NEGATIVE mg/dL
Leukocytes,Ua: NEGATIVE
Nitrite: NEGATIVE
Protein, ur: NEGATIVE mg/dL
Specific Gravity, Urine: 1.008 (ref 1.005–1.030)
pH: 6 (ref 5.0–8.0)

## 2019-12-13 LAB — COMPREHENSIVE METABOLIC PANEL
ALT: 22 U/L (ref 0–44)
AST: 19 U/L (ref 15–41)
Albumin: 3.8 g/dL (ref 3.5–5.0)
Alkaline Phosphatase: 86 U/L (ref 38–126)
Anion gap: 8 (ref 5–15)
BUN: 9 mg/dL (ref 8–23)
CO2: 25 mmol/L (ref 22–32)
Calcium: 9 mg/dL (ref 8.9–10.3)
Chloride: 106 mmol/L (ref 98–111)
Creatinine, Ser: 0.6 mg/dL (ref 0.44–1.00)
GFR calc Af Amer: >60 mL/min (ref >60)
GFR calc non Af Amer: >60 mL/min (ref >60)
Glucose, Bld: 117 mg/dL — ABNORMAL HIGH (ref 70–99)
Potassium: 3.8 mmol/L (ref 3.5–5.1)
Sodium: 139 mmol/L (ref 135–145)
Total Bilirubin: 0.9 mg/dL (ref 0.3–1.2)
Total Protein: 8.2 g/dL — ABNORMAL HIGH (ref 6.5–8.1)

## 2019-12-13 LAB — LIPASE, BLOOD: Lipase: 24 U/L (ref 11–51)
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
## 2019-12-13 MED ORDER — ONDANSETRON 8 MG PO TBDP
8.0000 mg | ORAL_TABLET | Freq: Once | ORAL | Status: AC
  Administered 2019-12-13: 8 mg via ORAL
  Filled 2019-12-13: qty 1

## 2019-12-13 MED ORDER — FENTANYL CITRATE (PF) 100 MCG/2ML IJ SOLN
50.0000 ug | Freq: Once | INTRAMUSCULAR | Status: AC
  Administered 2019-12-13: 50 ug via INTRAMUSCULAR
  Filled 2019-12-13: qty 2

## 2019-12-13 MED ORDER — KETOROLAC TROMETHAMINE 60 MG/2ML IM SOLN
30.0000 mg | Freq: Once | INTRAMUSCULAR | Status: AC
  Administered 2019-12-13: 30 mg via INTRAMUSCULAR
  Filled 2019-12-13: qty 2

## 2019-12-13 MED ORDER — IOHEXOL 300 MG/ML  SOLN
75.0000 mL | Freq: Once | INTRAMUSCULAR | Status: AC | PRN
  Administered 2019-12-13: 75 mL via INTRAVENOUS

## 2019-12-13 NOTE — ED Notes (Signed)
Dr Eulis Foster in to see pt.

## 2019-12-13 NOTE — ED Provider Notes (Signed)
7:30 AM-checkout from Dr. Lowella Dell to evaluate chest CT for evaluation of infrahilar mass, suspected to be cancer.  He has already told the patient that she may have a lung cancer.  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  At this time the patient's vital signs are normal.  There is no indication for further evaluation and treatment in ED setting.  Patient understands the findings and the importance of follow-up with oncology for initiation of further diagnostic evaluations and ultimately treatment.  She is encouraged to stop smoking.   Daleen Bo, MD 12/13/19 [phone]

## 2019-12-13 NOTE — Discharge Instructions (Addendum)
The cause of your back pain is not clear but it has improved without specific treatment.  It is okay to take things like Tylenol or Motrin for ongoing pain.  Try to stop smoking.  Follow-up with the oncologist for further evaluation and treatment, for suspected lung cancer.

## 2019-12-13 NOTE — ED Triage Notes (Signed)
Pt reports bilateral lower back pain x 2 days. Denies urinary symptoms. Denies fevers.
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
## 2020-01-10 ENCOUNTER — Ambulatory Visit: Payer: Self-pay | Admitting: Physician Assistant

## 2020-01-10 ENCOUNTER — Encounter: Payer: Self-pay | Admitting: Physician Assistant

## 2020-01-10 VITALS — BP 130/72 | HR 92 | Temp 97.5°F | Ht 65.5 in | Wt 159.5 lb

## 2020-01-10 DIAGNOSIS — R918 Other nonspecific abnormal finding of lung field: Secondary | ICD-10-CM

## 2020-01-10 DIAGNOSIS — Z7689 Persons encountering health services in other specified circumstances: Secondary | ICD-10-CM

## 2020-01-10 DIAGNOSIS — E278 Other specified disorders of adrenal gland: Secondary | ICD-10-CM

## 2020-01-10 DIAGNOSIS — E041 Nontoxic single thyroid nodule: Secondary | ICD-10-CM

## 2020-01-10 DIAGNOSIS — F17200 Nicotine dependence, unspecified, uncomplicated: Secondary | ICD-10-CM

## 2020-01-10 NOTE — Patient Instructions (Addendum)
[place] - 4th Floor Monday Jan 14, 2020 8AM Can bring one visitor 22 year or older Call Amy if you need to cancel or reschedule [phone]

## 2020-01-10 NOTE — Progress Notes (Signed)
BP 130/72   Pulse 92   Temp (!) 97.5 F (36.4 C)   Ht 5' 5.5" (1.664 m)   Wt 159 lb 8 oz (72.3 kg)   SpO2 95%   BMI 26.14 kg/m    Subjective:    Patient ID: Sandra Horne, female    DOB: [birth year], 64 y.o.   MRN: [id number]  HPI: DALEISA HALPERIN is a 64 y.o. female presenting on 01/10/2020 for New Patient (Initial Visit)   HPI   Pt had a negative covid 19 screening questionnaire.    Pt is a 51yoF who presents to establish care.    Pt went to ER 1 month ago and got CT for suspected kidney stone and lung cancer was diagnosed with suspected metastases.  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  Pt went to [facility] for PAP just prior to covid.  She has never had a mammogram.  She says she Feels pretty good.  She has good supportive family and friends.  She is understandably worried about her health.  She did not make appointment with oncology as recommended by ER because she was working on trying to get the financial aspect in hand first.    She is trying to stop smoking and has cut back.     Relevant past medical, surgical, family and social history reviewed and updated as indicated. Interim medical history since our last visit reviewed. Allergies and medications reviewed and updated.  No current outpatient medications on file.    Review of Systems  Per HPI unless specifically indicated above     Objective:    BP 130/72   Pulse 92   Temp (!) 97.5 F (36.4 C)   Ht 5' 5.5" (1.664 m)   Wt 159 lb 8 oz (72.3 kg)   SpO2 95%   BMI 26.14 kg/m   Wt Readings from Last 3 Encounters:  01/10/20 159 lb 8 oz (72.3 kg)  12/13/19 168 lb 11.2 oz (76.5 kg)  08/09/12 176 lb (79.8 kg)    Physical Exam Vitals reviewed.  Constitutional:      General: She is not in acute distress.    Appearance: Normal appearance. She is well-developed. She is not ill-appearing.  HENT:     Head: Normocephalic and atraumatic.  Eyes:      Conjunctiva/sclera: Conjunctivae normal.     Pupils: Pupils are equal, round, and reactive to light.  Neck:     Thyroid: No thyromegaly.  Cardiovascular:     Rate and Rhythm: Normal rate and regular rhythm.  Pulmonary:     Effort: Pulmonary effort is normal.     Breath sounds: Normal breath sounds.  Abdominal:     General: Bowel sounds are normal.     Palpations: Abdomen is soft. There is no mass.     Tenderness: There is no abdominal tenderness.  Musculoskeletal:     Cervical back: Neck supple.     Right lower leg: No edema.     Left lower leg: No edema.  Lymphadenopathy:     Cervical: No cervical adenopathy.  Skin:    General: Skin is warm and dry.  Neurological:     Mental Status: She is alert and oriented to person, place, and time.     Gait: Gait normal.  Psychiatric:        Attention and Perception: Attention normal.        Mood and Affect: Affect is not inappropriate.  Speech: Speech normal.        Behavior: Behavior normal. Behavior is cooperative.          Assessment & Plan:    Encounter Diagnoses  Name Primary?  . Encounter to establish care Yes  . Hilar mass   . Thyroid nodule   . Adrenal mass ([place])   . Tobacco use disorder      Appointment is arranged for pt with oncologist for Monday morning.  Discussed with pt that she needs to get started with appointments and not wait until all the financial things are arranged.    Record request sent to [facility] for most recent PAP.  Discussed with pt that we can arrange a screening mammogram for her but she can discuss with oncologist to see if that is recommended or not before we schedule that for her.  Pt will have virtual follow up in 1 month to make sure things are moving along.  She is to contact office sooner if she needs anything prior to that time.

## 2020-01-11 ENCOUNTER — Encounter (HOSPITAL_COMMUNITY): Payer: Self-pay

## 2020-01-11 NOTE — Progress Notes (Signed)
I have attempted to place an introductory phone call to this patient this morning. Unable to reach patient at this time and the voicemail box is full and I was unable to leave a message at this time.

## 2020-01-14 ENCOUNTER — Inpatient Hospital Stay (HOSPITAL_COMMUNITY): Payer: Self-pay | Attending: Hematology | Admitting: Hematology

## 2020-01-14 ENCOUNTER — Other Ambulatory Visit: Payer: Self-pay

## 2020-01-14 VITALS — BP 124/67 | HR 79 | Resp 16 | Ht 66.0 in | Wt 163.4 lb

## 2020-01-14 DIAGNOSIS — Z809 Family history of malignant neoplasm, unspecified: Secondary | ICD-10-CM | POA: Insufficient documentation

## 2020-01-14 DIAGNOSIS — F17210 Nicotine dependence, cigarettes, uncomplicated: Secondary | ICD-10-CM | POA: Insufficient documentation

## 2020-01-14 DIAGNOSIS — C3402 Malignant neoplasm of left main bronchus: Secondary | ICD-10-CM

## 2020-01-14 DIAGNOSIS — R918 Other nonspecific abnormal finding of lung field: Secondary | ICD-10-CM | POA: Insufficient documentation

## 2020-01-14 DIAGNOSIS — C3432 Malignant neoplasm of lower lobe, left bronchus or lung: Secondary | ICD-10-CM | POA: Insufficient documentation

## 2020-01-14 NOTE — Progress Notes (Signed)
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 64 y.o. 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 Daleen Bo from [facility]. She went to [facility] on 9/16 for bilateral lower back pain radiating to groin and a lung mass was discovered on CT scan.  Today she reports feeling good. Her lower back does not hurt any longer. She gets hot flashes but denies F/C, cough, hemoptysis, or unexplained weight loss. She did have 1 episode of hemoptysis when she came to [place], but no more. She denies CP, headaches, vision changes or leg swelling.  She continues smoking 1/2 PPD, peaking at 1 PPD, for 28 years; she is trying to cut down. She used to be a Glass blower/designer for 20 years. She lives with her father. Her sister had cancer; paternal uncle had cancer.  MEDICAL HISTORY:  No past medical history on file.  SURGICAL HISTORY: Past Surgical History:  Procedure Laterality Date  . ECTOPIC PREGNANCY SURGERY      SOCIAL HISTORY: Social History   Socioeconomic History  . Marital status: Widowed    Spouse name: Not on file  . Number of children: Not on file  . Years of education: Not on file  . Highest education level: Not on file  Occupational History  . Not on file  Tobacco Use  . Smoking status: Current Every Day Smoker    Packs/day: 0.25    Years: 40.00    Pack years: 10.00    Types: Cigarettes  . Smokeless tobacco: Never Used  Vaping Use  . Vaping Use: Never used  Substance and Sexual Activity  . Alcohol use: Yes    Comment: drinks beer rarely   . Drug use: Yes    Types: Marijuana    Comment: last marijuana use mid 11/19/2019  . Sexual activity: Yes    Birth  control/protection: None  Other Topics Concern  . Not on file  Social History Narrative  . Not on file   Social Determinants of Health   Financial Resource Strain: Low Risk   . Difficulty of Paying Living Expenses: Not hard at all  Food Insecurity: No Food Insecurity  . Worried About Charity fundraiser in the Last Year: Never true  . Ran Out of Food in the Last Year: Never true  Transportation Needs: No Transportation Needs  . Lack of Transportation (Medical): No  . Lack of Transportation (Non-Medical): No  Physical Activity: Inactive  . Days of Exercise per Week: 0 days  . Minutes of Exercise per Session: 0 min  Stress: No Stress Concern Present  . Feeling of Stress : Not at all  Social Connections: Socially Isolated  . Frequency of Communication with Friends and Family: More than three times a week  . Frequency of Social Gatherings with Friends and Family: Never  . Attends Religious Services: Never  . Active Member of Clubs or Organizations: No  . Attends Archivist Meetings: Never  . Marital Status: Widowed  Intimate Partner Violence: Not At Risk  . Fear of Current or Ex-Partner: No  . Emotionally Abused: No  . Physically Abused: No  . Sexually Abused: No    FAMILY HISTORY: Family  History  Problem Relation Age of Onset  . Hypertension Mother   . Stroke Mother   . Hypertension Father   . Cancer Sister   . Cancer Paternal Uncle     ALLERGIES:  has No Known Allergies.  MEDICATIONS:  No current outpatient medications on file.   No current facility-administered medications for this visit.    REVIEW OF SYSTEMS:   Review of Systems  Constitutional: Negative for appetite change, chills, fatigue, fever and unexpected weight change.  Eyes: Negative for eye problems.  Respiratory: Negative for cough and hemoptysis.   Cardiovascular: Negative for chest pain.  Endocrine: Positive for hot flashes.  Musculoskeletal: Negative for back pain.  Neurological:  Negative for headaches.      PHYSICAL EXAMINATION: ECOG PERFORMANCE STATUS: 1 - Symptomatic but completely ambulatory  Vitals:   01/14/20 0825  BP: 124/67  Pulse: 79  Resp: 16  SpO2: 100%   Filed Weights   01/14/20 0825  Weight: 163 lb 6.4 oz (74.1 kg)   Physical Exam Vitals reviewed.  Constitutional:      Appearance: Normal appearance.  Neck:     Thyroid: Thyromegaly (R side) present.  Cardiovascular:     Rate and Rhythm: Normal rate and regular rhythm.     Pulses: Normal pulses.     Heart sounds: Normal heart sounds.  Pulmonary:     Effort: Pulmonary effort is normal.     Breath sounds: Normal breath sounds.  Abdominal:     Palpations: Abdomen is soft. There is no hepatomegaly, splenomegaly or mass.     Tenderness: There is no abdominal tenderness.     Hernia: No hernia is present.  Musculoskeletal:     Right lower leg: No edema.     Left lower leg: No edema.     Comments: Nail clubbing bilaterally  Lymphadenopathy:     Upper Body:     Right upper body: No axillary or pectoral adenopathy.     Left upper body: No axillary or pectoral adenopathy.     Lower Body: No right inguinal adenopathy. No left inguinal adenopathy.  Neurological:     General: No focal deficit present.     Mental Status: She is alert and oriented to person, place, and time.  Psychiatric:        Mood and Affect: Mood normal.        Behavior: Behavior normal.      LABORATORY DATA:  I have reviewed the data as listed CBC Latest Ref Rng & Units 12/13/2019 04/23/2012  WBC 4.0 - 10.5 K/uL 9.3 5.9  Hemoglobin 12.0 - 15.0 g/dL 12.5 13.6  Hematocrit 36 - 46 % 40.3 40.5  Platelets 150 - 400 K/uL 394 372   CMP Latest Ref Rng & Units 12/13/2019 04/23/2012  Glucose 70 - 99 mg/dL 117(H) 108(H)  BUN 8 - 23 mg/dL 9 10  Creatinine 0.44 - 1.00 mg/dL 0.60 0.79  Sodium 135 - 145 mmol/L 139 137  Potassium 3.5 - 5.1 mmol/L 3.8 3.9  Chloride 98 - 111 mmol/L 106 101  CO2 22 - 32 mmol/L 25 27  Calcium 8.9  - 10.3 mg/dL 9.0 9.4  Total Protein 6.5 - 8.1 g/dL 8.2(H) -  Total Bilirubin 0.3 - 1.2 mg/dL 0.9 -  Alkaline Phos 38 - 126 U/L 86 -  AST 15 - 41 U/L 19 -  ALT 0 - 44 U/L 22 -    RADIOGRAPHIC STUDIES: I have personally reviewed the radiological images as listed and agreed with  the findings in the report.  ASSESSMENT:  1.  Left lung mass with adenopathy: -Current active smoker, half pack per day for 32 years. -Presentation to the ER on 12/13/2019 with low back pain. -CT renal study on 12/13/2019 showed left infrahilar mass.  No abdominal or pelvic adenopathy or masses. -CT chest on 12/13/2019 with contrast showed bulky left hilar and prevascular adenopathy with left infrahilar mass measuring 3.1 x 2.7 cm.  Large heterogeneous right thyroid nodule measuring 3 x 2.0 cm.  7 mm hyperenhancing nodule in the left adrenal gland is nonspecific. -1 episode of hemoptysis on 12/13/2019.  No headaches or vision changes.  2.  Social/family history: -Half pack per day for 32 years smoker.  Worked in Charity fundraiser for 20 years. -Lives with her father. -Sister and paternal uncle died of cancers.  Patient does not know the types.   PLAN:  1.  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 -I have recommended PET CT scan for further staging and locating area for biopsy. -We will also order MRI of the brain with and without contrast to complete staging work-up. -We will review PET scan and arrange biopsy.    All questions were answered. The patient knows to call the clinic with any problems, questions or concerns.  Derek Jack, MD, 01/14/20 8:59 AM  [place] [phone]   I, Milinda Antis, am acting as a scribe for Dr. Sanda Linger.  I, Derek Jack MD, have reviewed the above documentation for accuracy and completeness, and I agree with the above.

## 2020-01-14 NOTE — Patient Instructions (Addendum)
[facility] at [facility] Discharge Instructions  You were seen and examined today by Dr. Delton Coombes. Dr. Delton Coombes is a medical oncologist, meaning he specializes in the medical management of cancer. Dr. Delton Coombes discussed your past medical history and the events that led to you being here today.  On your recent CT scan, there is an mass located in your left lung concerning for primary lung cancer. There is also an enlarged lymph node in your chest, concerning for spread to the lymph node. Dr. Delton Coombes has recommended you get a PET scan. A PET scan is a scan that illuminates where cancer is present in your body. We will also do an MRI of your brain to ensure that there is no cancer present in your brain.  We will see you back after the scans, these scans will allow Dr. Delton Coombes to recommend the best biopsy.   Thank you for choosing [place] at [facility] to provide your oncology and hematology care.  To afford each patient quality time with our provider, please arrive at least 15 minutes before your scheduled appointment time.   If you have a lab appointment with the [facility] please come in thru the Main Entrance and check in at the main information desk.  You need to re-schedule your appointment should you arrive 10 or more minutes late.  We strive to give you quality time with our providers, and arriving late affects you and other patients whose appointments are after yours.  Also, if you no show three or more times for appointments you may be dismissed from the clinic at the providers discretion.     Again, thank you for choosing [facility].  Our hope is that these requests will decrease the amount of time that you wait before being seen by our physicians.       _____________________________________________________________  Should you have questions after your visit to [facility], please contact our office  at [phone] and follow the prompts.  Our office hours are 8:00 a.m. and 4:30 p.m. Monday - Friday.  Please note that voicemails left after 4:00 p.m. may not be returned until the following business day.  We are closed weekends and major holidays.  You do have access to a nurse 24-7, just call the main number to the clinic [phone] and do not press any options, hold on the line and a nurse will answer the phone.    For prescription refill requests, have your pharmacy contact our office and allow 72 hours.    Due to Covid, you will need to wear a mask upon entering the hospital. If you do not have a mask, a mask will be given to you at the Main Entrance upon arrival. For doctor visits, patients may have 1 support person age 24 or older with them. For treatment visits, patients can not have anyone with them due to social distancing guidelines and our immunocompromised population.

## 2020-01-17 ENCOUNTER — Ambulatory Visit (HOSPITAL_COMMUNITY)
Admission: RE | Admit: 2020-01-17 | Discharge: 2020-01-17 | Disposition: A | Discharge status: Home / Self Care | Payer: Self-pay | Source: Ambulatory Visit | Attending: Hematology | Admitting: Hematology

## 2020-01-17 ENCOUNTER — Other Ambulatory Visit: Payer: Self-pay

## 2020-01-17 DIAGNOSIS — R918 Other nonspecific abnormal finding of lung field: Secondary | ICD-10-CM | POA: Insufficient documentation

## 2020-01-17 DIAGNOSIS — C3402 Malignant neoplasm of left main bronchus: Secondary | ICD-10-CM | POA: Insufficient documentation

## 2020-01-17 MED ORDER — GADOBUTROL 1 MMOL/ML IV SOLN
7.0000 mL | Freq: Once | INTRAVENOUS | Status: AC | PRN
  Administered 2020-01-17: 7 mL via INTRAVENOUS

## 2020-01-21 ENCOUNTER — Ambulatory Visit (HOSPITAL_COMMUNITY): Admission: RE | Admit: 2020-01-21 | Payer: Self-pay | Source: Ambulatory Visit

## 2020-01-22 ENCOUNTER — Inpatient Hospital Stay (HOSPITAL_COMMUNITY): Payer: Self-pay | Admitting: Hematology

## 2020-02-04 ENCOUNTER — Ambulatory Visit (HOSPITAL_COMMUNITY)
Admission: RE | Admit: 2020-02-04 | Discharge: 2020-02-04 | Disposition: A | Discharge status: Home / Self Care | Payer: Self-pay | Source: Ambulatory Visit | Attending: Hematology | Admitting: Hematology

## 2020-02-04 ENCOUNTER — Other Ambulatory Visit: Payer: Self-pay

## 2020-02-04 DIAGNOSIS — C3402 Malignant neoplasm of left main bronchus: Secondary | ICD-10-CM | POA: Insufficient documentation

## 2020-02-04 DIAGNOSIS — R918 Other nonspecific abnormal finding of lung field: Secondary | ICD-10-CM | POA: Insufficient documentation

## 2020-02-04 IMAGING — PT NM PET TUM IMG INITIAL (PI) SKULL BASE T - THIGH
6 series · 25 of 25 positions shown · non-contrast
Comparison: None.

CLINICAL DATA: Initial treatment strategy for pulmonary nodule.

EXAM:
NUCLEAR MEDICINE PET SKULL BASE TO THIGH
TECHNIQUE: 9.8 mCi F-18 FDG was injected intravenously. Full-ring PET imaging
was performed from the skull base to thigh after the radiotracer. CT
data was obtained and used for attenuation correction and anatomic
localization.
Fasting blood glucose: 90 mg/dl

[Series 3: ct wb fusion · axial · 5.0mm · 0.98mm/px · z∈[-1482,-607]mm · 6 of 348 slices shown]
[im 1/348]
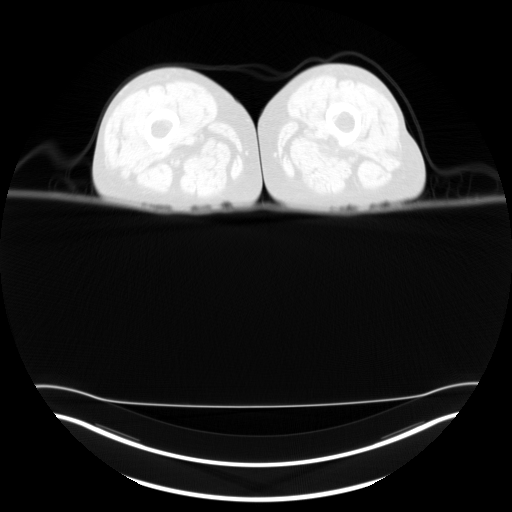
[im 70/348]
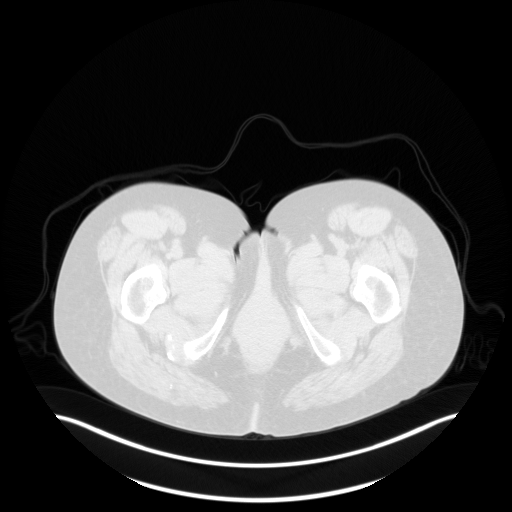
[im 139/348]
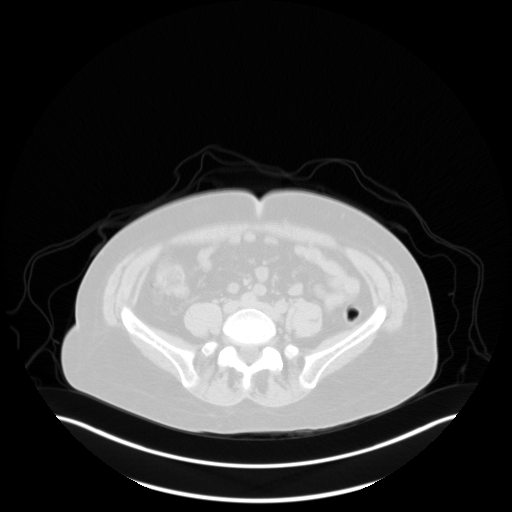
[im 209/348]
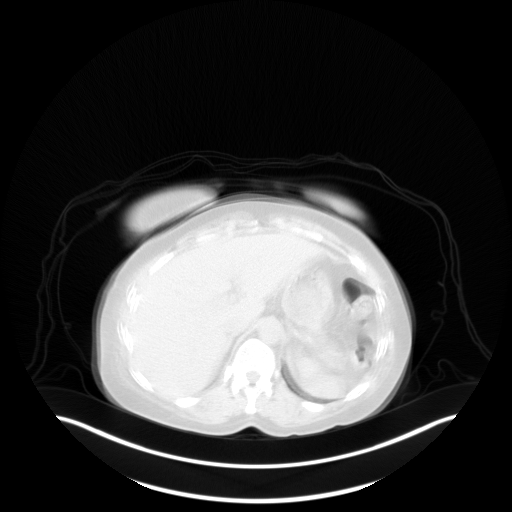
[im 278/348]
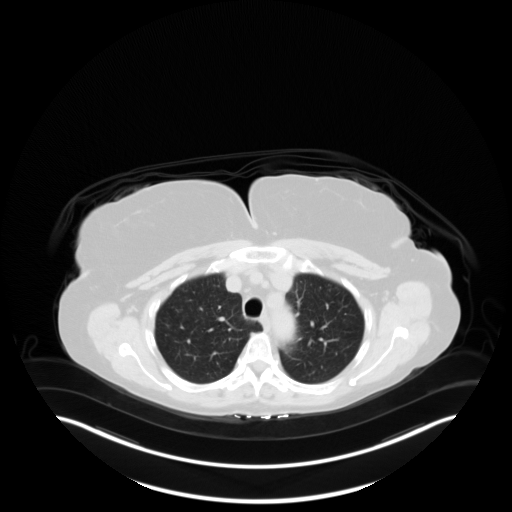
[im 348/348  brain]
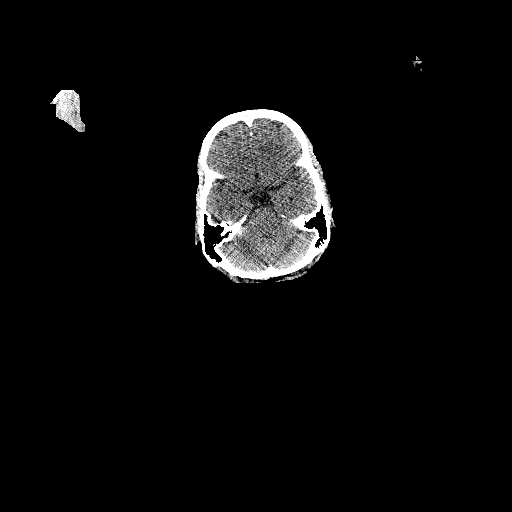

[Series 4: pet wb · axial · 5.0mm · 4.11mm/px · z∈[-1482,-607]mm · 7 of 351 slices shown]
[im 1/351]
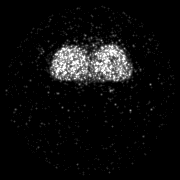
[im 59/351]
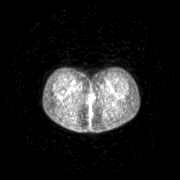
[im 117/351]
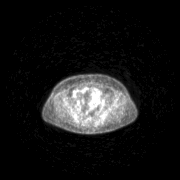
[im 176/351]
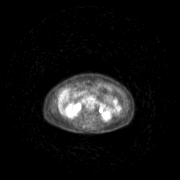
[im 234/351]
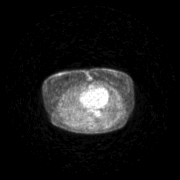
[im 292/351]
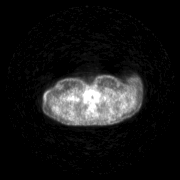
[im 351/351]
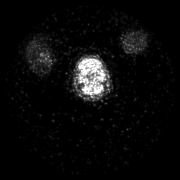

[Series 5: pet wb uncorrected · axial · 5.0mm · 4.11mm/px · z∈[-1482,-607]mm · 7 of 351 slices shown]
[im 1/351]
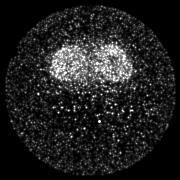
[im 59/351]
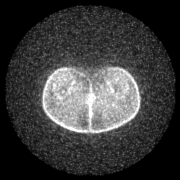
[im 117/351]
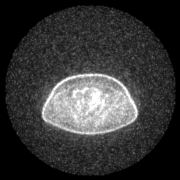
[im 176/351]
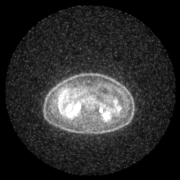
[im 234/351]
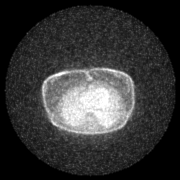
[im 292/351]
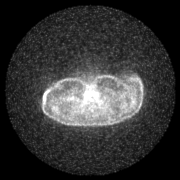
[im 351/351]
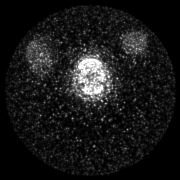

[axial ct wb fusion · 3 of 175 slices shown]
[im 1/175]
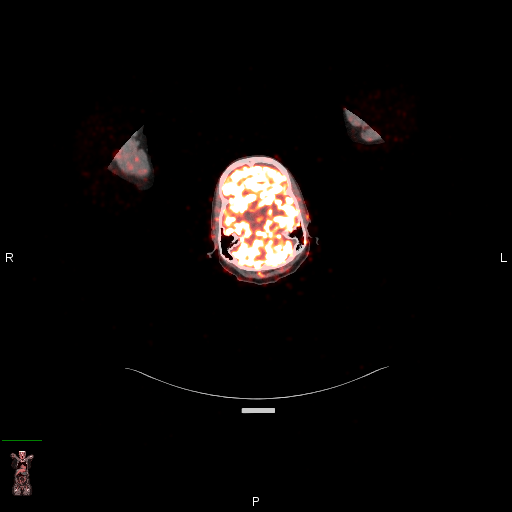
[im 88/175]
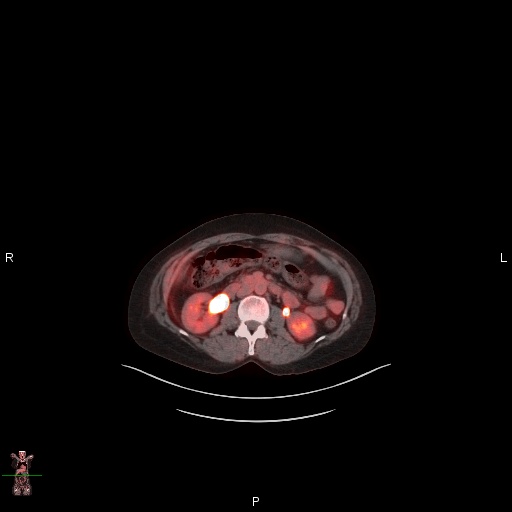
[im 175/175]
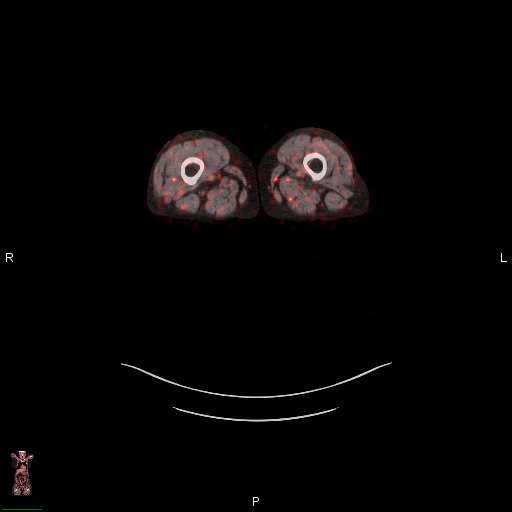

[coronal ct wb fusion · 1 of 42 slices shown]
[im 1/42]
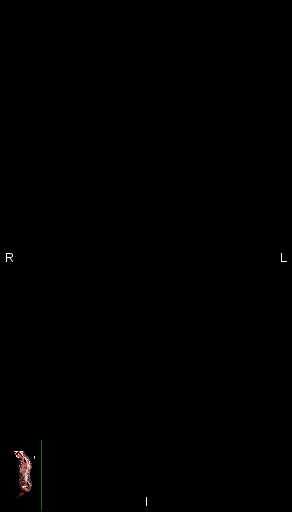

[mip · 1 of 48 slices shown]
[im 1/48]
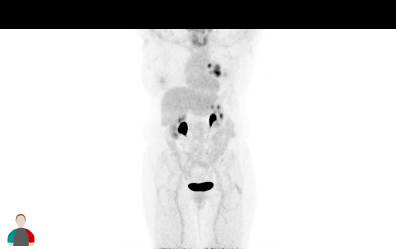

[25 of 25 positions shown; findings below may reference images not displayed]

FINDINGS: Mediastinal blood pool activity: SUV max

Liver activity: SUV max NA

NECK: There is enlargement of the RIGHT lobe of the thyroid gland.
There is uniform increased activity within the thyroid gland which
is typically suggest thyroiditis. No focality.

Incidental CT findings: none

CHEST: Multilobular hypermetabolic mass at the LEFT hilum with SUV
max equal 10.5. Approximately 3 discrete enlarged node like lesions
which are difficult to separate on noncontrast exam but measured 3
cm and 2 cm on the diagnostic CT [DATE]. In total LEFT hilar
enlargement measures 4.7 x 3.2 cm.

No contralateral hypermetabolic nodes. No supraclavicular
hypermetabolic nodes.

No enlarged or hypermetabolic pulmonary nodules.

Incidental CT findings: none

ABDOMEN/PELVIS: No abnormal metabolic activity in the liver. No
abnormal activity associated with the LEFT adrenal gland. Small
hyperenhancing nodule was present on comparison CT within LEFT
adrenal gland.

No hypermetabolic abdominopelvic lymph nodes.

Incidental CT findings: Normal GI tract.  Normal uterus and ovaries.

SKELETON: No focal hypermetabolic activity to suggest skeletal
metastasis.

Incidental CT findings: none
IMPRESSION: 1. Hypermetabolic LEFT hilar masslike nodal enlargement is
consistent with malignancy. Differential include primary malignancy
versus metastatic disease. Favor bronchogenic carcinoma. No lung
parenchymal primary identified. Recommend tissue sampling of the
LEFT hilum.
2. No evidence of metastatic disease in the abdomen pelvis.
3. No metabolic activity within the LEFT adrenal gland. See
comparison CT [DATE] for recommendations for future follow-up a
small enhancing nodule.
4. Uniform hypermetabolic activity within an enlarged thyroid gland
typically indicates a thyroiditis.

## 2020-02-04 MED ORDER — FLUDEOXYGLUCOSE F - 18 (FDG) INJECTION
9.8000 | Freq: Once | INTRAVENOUS | Status: AC | PRN
  Administered 2020-02-04: 9.8 via INTRAVENOUS

## 2020-02-05 ENCOUNTER — Encounter: Payer: Self-pay | Admitting: Physician Assistant

## 2020-02-05 ENCOUNTER — Ambulatory Visit: Payer: Self-pay | Admitting: Physician Assistant

## 2020-02-05 VITALS — HR 98 | Temp 97.9°F

## 2020-02-05 DIAGNOSIS — F17200 Nicotine dependence, unspecified, uncomplicated: Secondary | ICD-10-CM

## 2020-02-05 DIAGNOSIS — C3492 Malignant neoplasm of unspecified part of left bronchus or lung: Secondary | ICD-10-CM

## 2020-02-05 NOTE — Progress Notes (Signed)
Pt presented to office (she forgot she was scheduled for virtual appointment).  Nurse was starting her check-in process when pt got a phone call and had to leave right away.  She said she would call to reschedule.

## 2020-02-06 ENCOUNTER — Inpatient Hospital Stay (HOSPITAL_COMMUNITY): Payer: Self-pay | Attending: Hematology | Admitting: Hematology

## 2020-02-18 ENCOUNTER — Encounter (HOSPITAL_COMMUNITY): Payer: Self-pay

## 2020-02-18 NOTE — Progress Notes (Signed)
Unable to reach the patient to reschedule follow-up appt. VM box is full and unable to leave a message at this time.

## 2020-02-19 ENCOUNTER — Encounter (HOSPITAL_COMMUNITY): Payer: Self-pay

## 2020-02-19 NOTE — Progress Notes (Signed)
Attempted again to reach patient to schedule f/u. Unable to reach patient at this time and no VM available.

## 2020-02-20 ENCOUNTER — Encounter (HOSPITAL_COMMUNITY): Payer: Self-pay

## 2020-02-20 NOTE — Progress Notes (Signed)
Unable to reach patient regarding follow-up appt. Letter sent to patient's home address.

## 2021-03-05 ENCOUNTER — Encounter (HOSPITAL_COMMUNITY): Payer: Self-pay

## 2021-03-05 ENCOUNTER — Other Ambulatory Visit: Payer: Self-pay

## 2021-03-05 ENCOUNTER — Emergency Department (HOSPITAL_COMMUNITY): Payer: Medicaid Other

## 2021-03-05 ENCOUNTER — Emergency Department (HOSPITAL_COMMUNITY)
Admission: EM | Admit: 2021-03-05 | Discharge: 2021-03-05 | Disposition: A | Discharge status: Home / Self Care | Payer: Medicaid Other | Attending: Emergency Medicine | Admitting: Emergency Medicine

## 2021-03-05 DIAGNOSIS — U071 COVID-19: Secondary | ICD-10-CM | POA: Diagnosis not present

## 2021-03-05 DIAGNOSIS — F17210 Nicotine dependence, cigarettes, uncomplicated: Secondary | ICD-10-CM | POA: Diagnosis not present

## 2021-03-05 DIAGNOSIS — K5900 Constipation, unspecified: Secondary | ICD-10-CM | POA: Diagnosis present

## 2021-03-05 LAB — RESP PANEL BY RT-PCR (FLU A&B, COVID) ARPGX2
Influenza A by PCR: NEGATIVE
Influenza B by PCR: NEGATIVE
SARS Coronavirus 2 by RT PCR: POSITIVE — AB

## 2021-03-05 IMAGING — DX DG ABDOMEN 1V
1 series · 1 of 1 positions shown · non-contrast
Comparison: None.

CLINICAL DATA: Abdominal pain, constipation.

EXAM:
ABDOMEN - 1 VIEW

[abdomen supine]
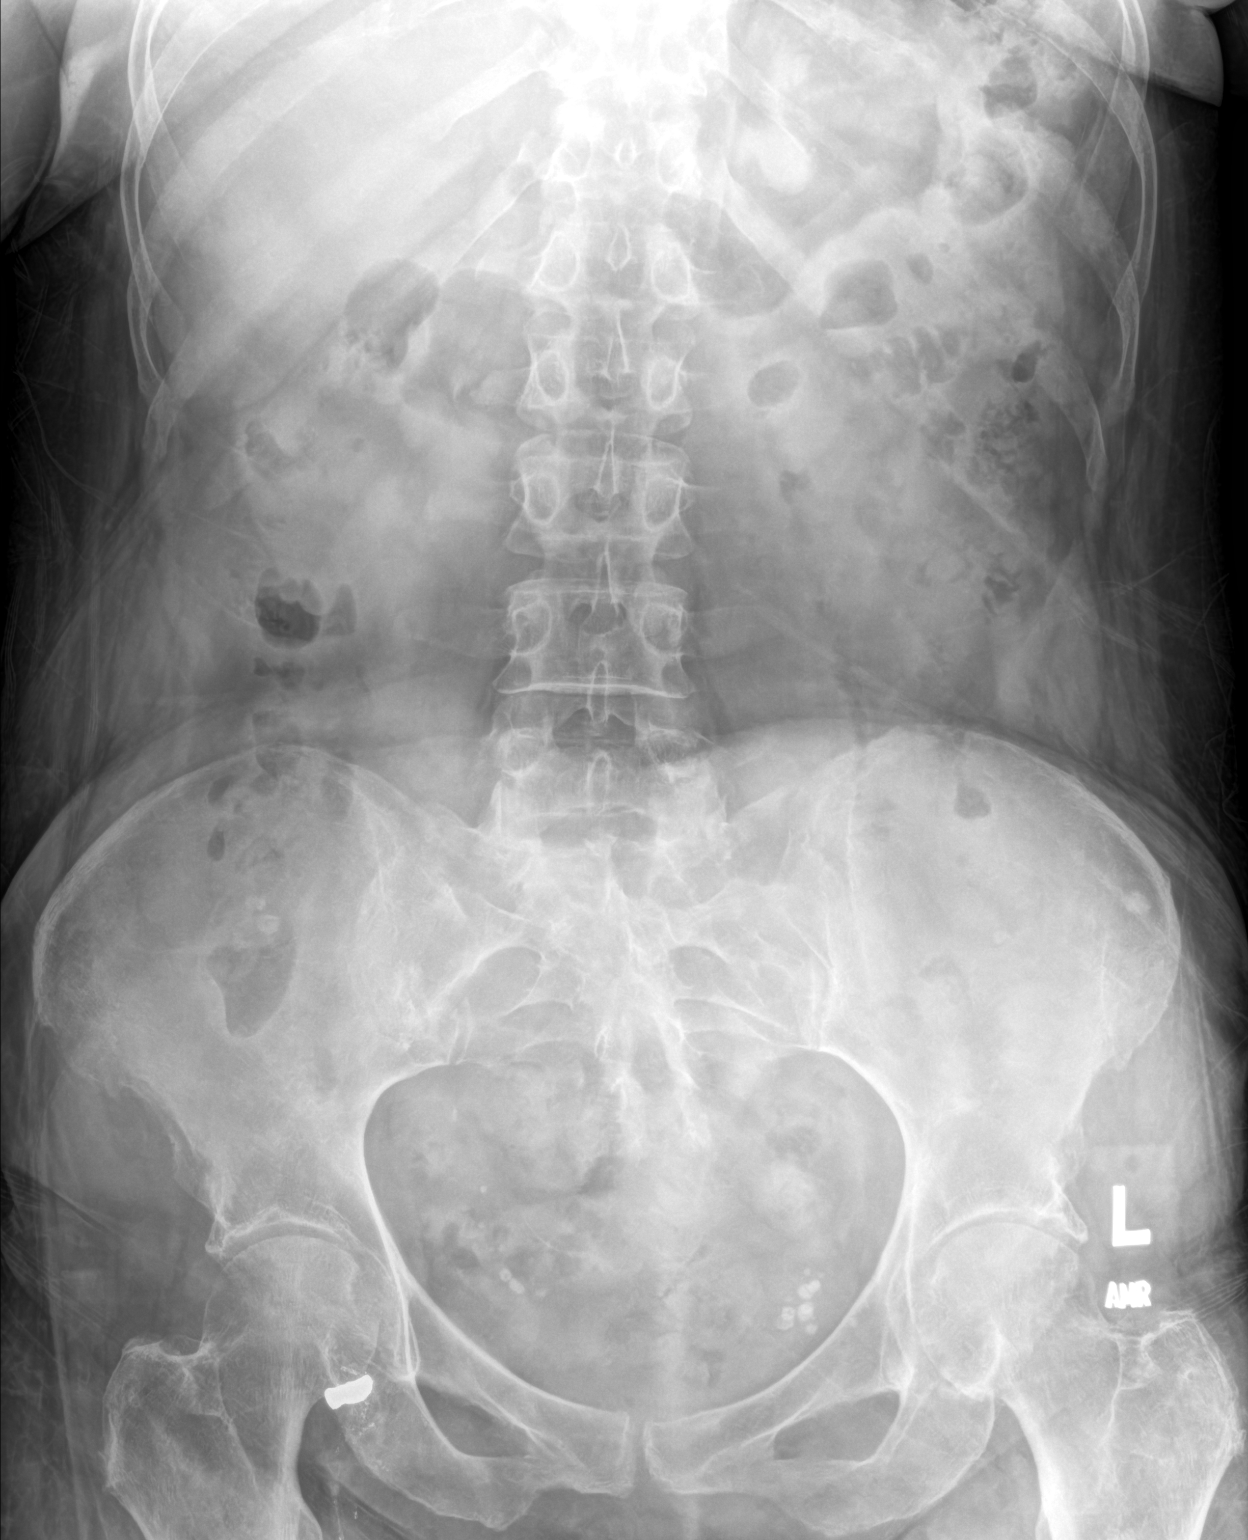

[1 of 1 positions shown; findings below may reference images not displayed]

FINDINGS: The bowel gas pattern is normal. No definite nephrolithiasis is
noted. Phleboliths are noted in the pelvis.
IMPRESSION: No abnormal bowel dilatation is noted.

## 2021-03-05 MED ORDER — NIRMATRELVIR/RITONAVIR (PAXLOVID) TABLET (RENAL DOSING): 2.0000 | ORAL_TABLET | Freq: Two times a day (BID) | ORAL | 0 refills | Status: AC

## 2021-03-05 MED ORDER — POLYETHYLENE GLYCOL 3350 17 G PO PACK
17.0000 g | PACK | Freq: Every day | ORAL | 0 refills | Status: DC
Start: 2021-03-05 — End: 2021-06-02

## 2021-03-05 MED ORDER — MAGNESIUM HYDROXIDE 400 MG/5ML PO SUSP
30.0000 mL | Freq: Once | ORAL | Status: AC
  Administered 2021-03-05: 30 mL via ORAL
  Filled 2021-03-05: qty 30

## 2021-03-05 NOTE — ED Provider Notes (Signed)
[facility] Provider Note   CSN: [id number] Arrival date & time: 03/05/21  0741     History Chief Complaint  Patient presents with   Constipation    Sandra Horne is a 65 y.o. female.  This is a 65 y.o. female with significant medical history as below, including prior ectopic pregnancy who presents to the ED with complaint of constipation.  Patient ports she is have been having reduced bowel movements over the past week.  She is still passing small amount of stool and flatus.  No vomiting.  No pain with defecation, no melena or bright red blood per rectum.  No change to urinary function.  No fevers or chills.  No acute abdominal pain.  No recent medication or dietary changes.  She has not attempted to take any laxatives or stool softeners over-the-counter.    No emesis or hematochezia, no hematemesis.  The history is provided by the patient. No language interpreter was used.  Constipation Associated symptoms: no abdominal pain, no back pain, no dysuria, no fever and no nausea       History reviewed. No pertinent past medical history.  Patient Active Problem List   Diagnosis Date Noted   Mass of left lung 01/14/2020   CLOSED FRACTURE OF UNSPECIFIED PART OF HUMERUS 04/01/2010    Past Surgical History:  Procedure Laterality Date   ECTOPIC PREGNANCY SURGERY       OB History   No obstetric history on file.     Family History  Problem Relation Age of Onset   Hypertension Mother    Stroke Mother    Hypertension Father    Cancer Sister    Cancer Paternal Uncle     Social History   Tobacco Use   Smoking status: Some Days    Packs/day: 0.25    Years: 40.00    Pack years: 10.00    Types: Cigarettes   Smokeless tobacco: Never  Vaping Use   Vaping Use: Never used  Substance Use Topics   Alcohol use: Yes    Comment: drinks beer rarely    Drug use: Not Currently    Types: Marijuana    Comment: last marijuana use mid 11/19/2019    Home  Medications Prior to Admission medications   Medication Sig Start Date End Date Taking? Authorizing Provider  nirmatrelvir/ritonavir EUA, renal dosing, (PAXLOVID) 10 x 150 MG & 10 x 100MG  TABS Take 2 tablets by mouth 2 (two) times daily for 5 days. Take nirmatrelvir (150 mg) one tablet twice daily for 5 days and ritonavir (100 mg) one tablet twice daily for 5 days. 03/05/21 03/10/21 Yes Wynona Dove A, DO  polyethylene glycol (MIRALAX) 17 g packet Take 17 g by mouth daily. 03/05/21  Yes Jeanell Sparrow, DO    Allergies    Patient has no known allergies.  Review of Systems   Review of Systems  Constitutional:  Negative for activity change and fever.  HENT:  Negative for facial swelling and trouble swallowing.   Eyes:  Negative for discharge and redness.  Respiratory:  Negative for cough and shortness of breath.   Cardiovascular:  Negative for chest pain and palpitations.  Gastrointestinal:  Positive for constipation. Negative for abdominal pain and nausea.  Genitourinary:  Negative for dysuria and flank pain.  Musculoskeletal:  Negative for back pain and gait problem.  Skin:  Negative for pallor and rash.  Neurological:  Negative for syncope and headaches.   Physical Exam Updated Vital Signs  BP (!) 142/68   Pulse 84   Temp 98.3 F (36.8 C) (Oral)   Resp 16   Ht 5\' 5"  (1.651 m)   Wt 65.8 kg   SpO2 94%   BMI 24.13 kg/m   Physical Exam Vitals and nursing note reviewed.  Constitutional:      General: She is not in acute distress.    Appearance: Normal appearance.  HENT:     Head: Normocephalic and atraumatic.     Right Ear: External ear normal.     Left Ear: External ear normal.     Nose: Nose normal.     Mouth/Throat:     Mouth: Mucous membranes are moist.  Eyes:     General: No scleral icterus.       Right eye: No discharge.        Left eye: No discharge.  Cardiovascular:     Rate and Rhythm: Normal rate and regular rhythm.     Pulses: Normal pulses.     Heart sounds:  Normal heart sounds.  Pulmonary:     Effort: Pulmonary effort is normal. No respiratory distress.     Breath sounds: Normal breath sounds.  Abdominal:     General: Abdomen is flat. There is no distension.     Palpations: Abdomen is soft.     Tenderness: There is no abdominal tenderness.  Musculoskeletal:        General: Normal range of motion.     Cervical back: Normal range of motion.     Right lower leg: No edema.     Left lower leg: No edema.  Skin:    General: Skin is warm and dry.     Capillary Refill: Capillary refill takes less than 2 seconds.  Neurological:     Mental Status: She is alert.  Psychiatric:        Mood and Affect: Mood normal.        Behavior: Behavior normal.    ED Results / Procedures / Treatments   Labs (all labs ordered are listed, but only abnormal results are displayed) Labs Reviewed  RESP PANEL BY RT-PCR (FLU A&B, COVID) ARPGX2 - Abnormal; Notable for the following components:      Result Value   SARS Coronavirus 2 by RT PCR POSITIVE (*)    All other components within normal limits    EKG None  Radiology DG Abdomen 1 View  Result Date: 03/05/2021 CLINICAL DATA:  Abdominal pain, constipation. EXAM: ABDOMEN - 1 VIEW COMPARISON:  None. FINDINGS: The bowel gas pattern is normal. No definite nephrolithiasis is noted. Phleboliths are noted in the pelvis. IMPRESSION: No abnormal bowel dilatation is noted. Electronically Signed   By: Marijo Conception M.D.   On: 03/05/2021 08:59    Procedures Procedures   Medications Ordered in ED Medications  magnesium hydroxide (MILK OF MAGNESIA) suspension 30 mL (has no administration in time range)    ED Course  I have reviewed the triage vital signs and the nursing notes.  Pertinent labs & imaging results that were available during my care of the patient were reviewed by me and considered in my medical decision making (see chart for details).    MDM Rules/Calculators/A&P                           CC:  constipation  This patient complains of constipation; this involves an extensive number of treatment options and is a complaint that carries  with it a high risk of complications and morbidity. Vital signs were reviewed. Serious etiologies considered.  Suspicion for obstruction is low however will obtain KUB  Record review:  Previous records obtained and reviewed    Work up as above, notable for:   imaging results that were available during my care of the patient were reviewed by me and considered in my medical decision making.   I ordered imaging studies which included kub and I independently visualized and interpreted imaging which showed no abnormal dilation  Management: Patient given milk of magnesia to take home as mag citrate is recalled  Patient found to be positive COVID-19, she is not vaccinated.  Discussed Paxlovid, she is willing to take Paxlovid at this time.  Prior renal function is stable.  Also discussed supportive care regarding COVID-19 symptoms.  Strict return precautions.  She has no respiratory complaints at this time.  Patient also with constipation, discussed abortive care regarding constipation at home, dietary changes, advised to follow PCP regarding constipation  The patient improved significantly and was discharged in stable condition. Detailed discussions were had with the patient regarding current findings, and need for close f/u with PCP or on call doctor. The patient has been instructed to return immediately if the symptoms worsen in any way for re-evaluation. Patient verbalized understanding and is in agreement with current care plan. All questions answered prior to discharge.          This chart was dictated using voice recognition software.  Despite best efforts to proofread,  errors can occur which can change the documentation meaning.  Final Clinical Impression(s) / ED Diagnoses Final diagnoses:  COVID-19  Constipation, unspecified constipation  type    Rx / DC Orders ED Discharge Orders          Ordered    nirmatrelvir/ritonavir EUA, renal dosing, (PAXLOVID) 10 x 150 MG & 10 x 100MG  TABS  2 times daily        03/05/21 1032    polyethylene glycol (MIRALAX) 17 g packet  Daily        03/05/21 1034             Wynona Dove A, DO 03/05/21 1038

## 2021-03-05 NOTE — Discharge Instructions (Addendum)
Please return to ED if you develop vomiting, unable to tolerate oral medications. Severe abdominal pain.   Follow up with your pcp regarding constipation.

## 2021-03-05 NOTE — ED Triage Notes (Signed)
Patient is a little confusing due to reporting diarrhea like a week or two ago and now not having BM x 1 week but hasnt taken anything otc which was recommended to her.  Also complaining of intermittent headache. Denies abd pain n/v

## 2021-03-15 ENCOUNTER — Emergency Department (HOSPITAL_COMMUNITY)
Admission: EM | Admit: 2021-03-15 | Discharge: 2021-03-15 | Disposition: A | Discharge status: Home / Self Care | Payer: Medicaid Other | Attending: Emergency Medicine | Admitting: Emergency Medicine

## 2021-03-15 ENCOUNTER — Emergency Department (HOSPITAL_COMMUNITY): Payer: Medicaid Other

## 2021-03-15 ENCOUNTER — Encounter (HOSPITAL_COMMUNITY): Payer: Self-pay | Admitting: *Deleted

## 2021-03-15 DIAGNOSIS — R630 Anorexia: Secondary | ICD-10-CM | POA: Insufficient documentation

## 2021-03-15 DIAGNOSIS — Z8616 Personal history of COVID-19: Secondary | ICD-10-CM | POA: Diagnosis not present

## 2021-03-15 DIAGNOSIS — R519 Headache, unspecified: Secondary | ICD-10-CM | POA: Insufficient documentation

## 2021-03-15 DIAGNOSIS — R0602 Shortness of breath: Secondary | ICD-10-CM | POA: Diagnosis not present

## 2021-03-15 DIAGNOSIS — F17210 Nicotine dependence, cigarettes, uncomplicated: Secondary | ICD-10-CM | POA: Insufficient documentation

## 2021-03-15 DIAGNOSIS — R531 Weakness: Secondary | ICD-10-CM | POA: Diagnosis not present

## 2021-03-15 DIAGNOSIS — G9389 Other specified disorders of brain: Secondary | ICD-10-CM

## 2021-03-15 DIAGNOSIS — N3001 Acute cystitis with hematuria: Secondary | ICD-10-CM

## 2021-03-15 DIAGNOSIS — R918 Other nonspecific abnormal finding of lung field: Secondary | ICD-10-CM

## 2021-03-15 LAB — URINALYSIS, ROUTINE W REFLEX MICROSCOPIC
Bilirubin Urine: NEGATIVE
Glucose, UA: NEGATIVE mg/dL
Ketones, ur: 5 mg/dL — AB
Leukocytes,Ua: NEGATIVE
Nitrite: NEGATIVE
Protein, ur: NEGATIVE mg/dL
Specific Gravity, Urine: 1.013 (ref 1.005–1.030)
pH: 6 (ref 5.0–8.0)

## 2021-03-15 LAB — CBC WITH DIFFERENTIAL/PLATELET
Abs Immature Granulocytes: 0.01 K/uL (ref 0.00–0.07)
Basophils Absolute: 0.1 K/uL (ref 0.0–0.1)
Basophils Relative: 1 %
Eosinophils Absolute: 0.1 K/uL (ref 0.0–0.5)
Eosinophils Relative: 2 %
HCT: 41.6 % (ref 36.0–46.0)
Hemoglobin: 13.4 g/dL (ref 12.0–15.0)
Immature Granulocytes: 0 %
Lymphocytes Relative: 27 %
Lymphs Abs: 1.7 K/uL (ref 0.7–4.0)
MCH: 28.5 pg (ref 26.0–34.0)
MCHC: 32.2 g/dL (ref 30.0–36.0)
MCV: 88.3 fL (ref 80.0–100.0)
Monocytes Absolute: 0.9 K/uL (ref 0.1–1.0)
Monocytes Relative: 14 %
Neutro Abs: 3.4 K/uL (ref 1.7–7.7)
Neutrophils Relative %: 56 %
Platelets: 459 K/uL — ABNORMAL HIGH (ref 150–400)
RBC: 4.71 MIL/uL (ref 3.87–5.11)
RDW: 13.5 % (ref 11.5–15.5)
WBC: 6.1 K/uL (ref 4.0–10.5)
nRBC: 0 % (ref 0.0–0.2)

## 2021-03-15 LAB — BASIC METABOLIC PANEL WITH GFR
Anion gap: 10 (ref 5–15)
BUN: 11 mg/dL (ref 8–23)
CO2: 26 mmol/L (ref 22–32)
Calcium: 9.4 mg/dL (ref 8.9–10.3)
Chloride: 97 mmol/L — ABNORMAL LOW (ref 98–111)
Creatinine, Ser: 0.73 mg/dL (ref 0.44–1.00)
GFR, Estimated: >60 mL/min
Glucose, Bld: 119 mg/dL — ABNORMAL HIGH (ref 70–99)
Potassium: 4.1 mmol/L (ref 3.5–5.1)
Sodium: 133 mmol/L — ABNORMAL LOW (ref 135–145)

## 2021-03-15 IMAGING — CT CT HEAD W/O CM
3 of 5 series · 15 of 47 positions shown, 18 images · non-contrast
Comparison: CT head [DATE].  MRI brain [DATE].

CLINICAL DATA: Headache. COVID 2 weeks ago. History of lung cancer.

EXAM:
CT HEAD WITHOUT CONTRAST
TECHNIQUE: Contiguous axial images were obtained from the base of the skull
through the vertex without intravenous contrast.

[Series 3: head w o · axial · 0.43mm/px · z∈[+222,+367]mm · 9 of 33 slices shown, 12 images]
[im 2/33  brain]
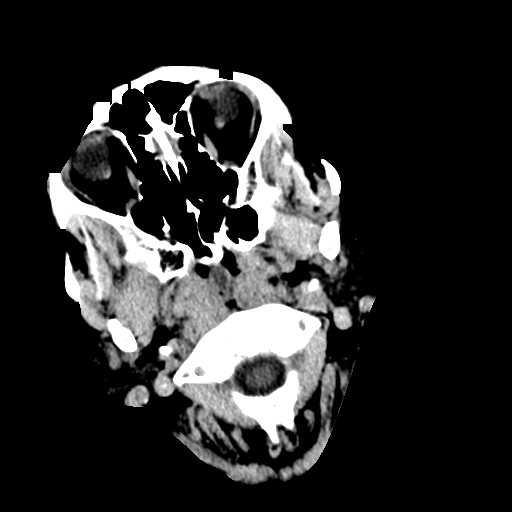
[im 2/33  bone]
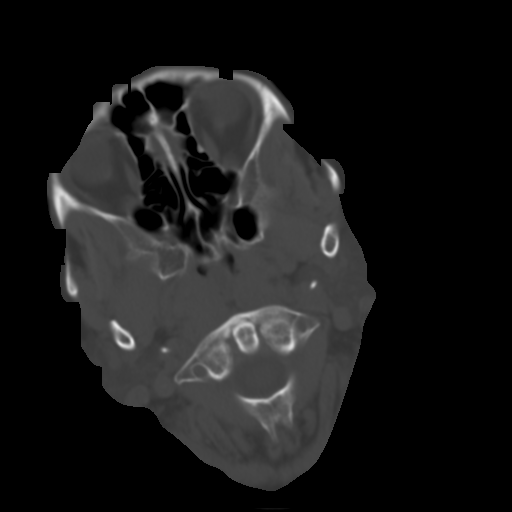
[im 6/33  brain]
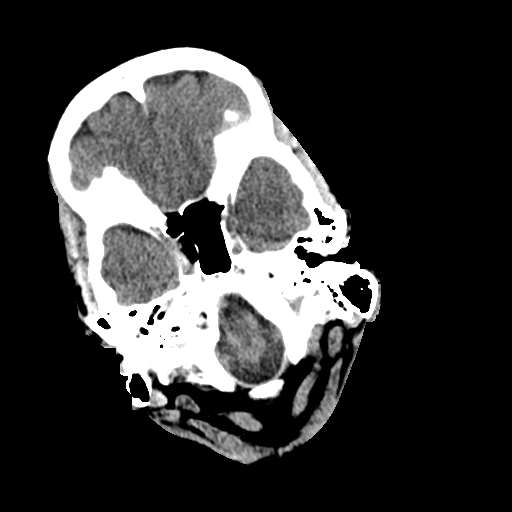
[im 9/33  brain]
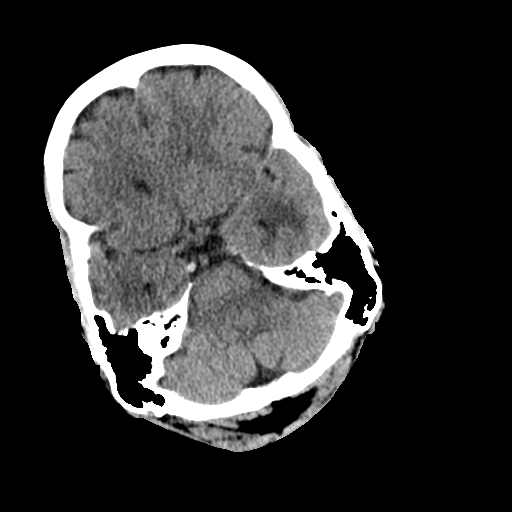
[im 13/33  brain]
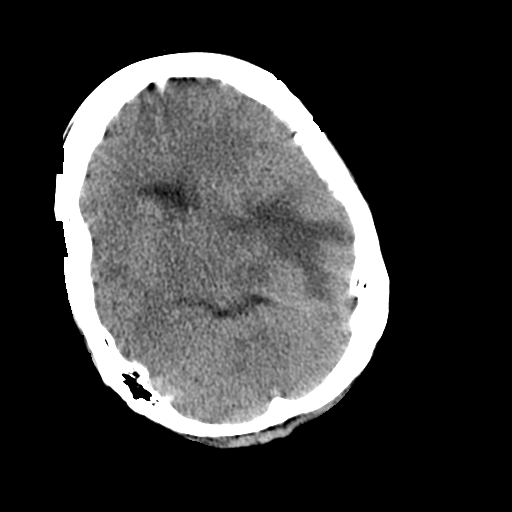
[im 17/33  brain]
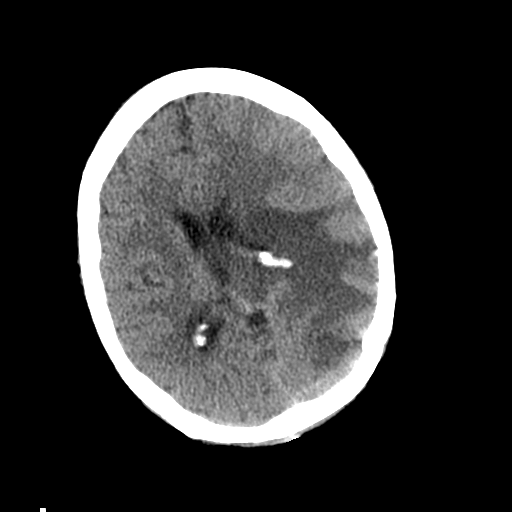
[im 17/33  bone]
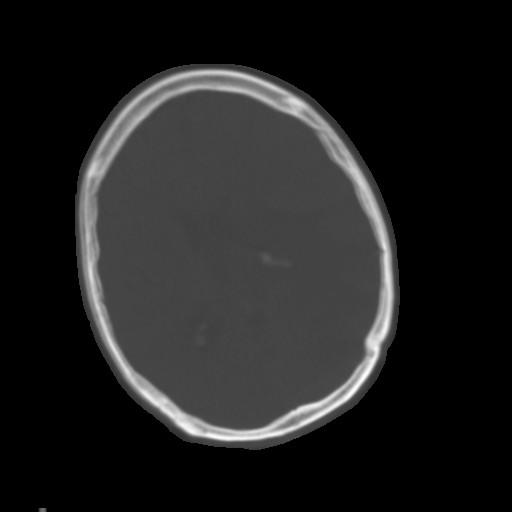
[im 20/33  brain]
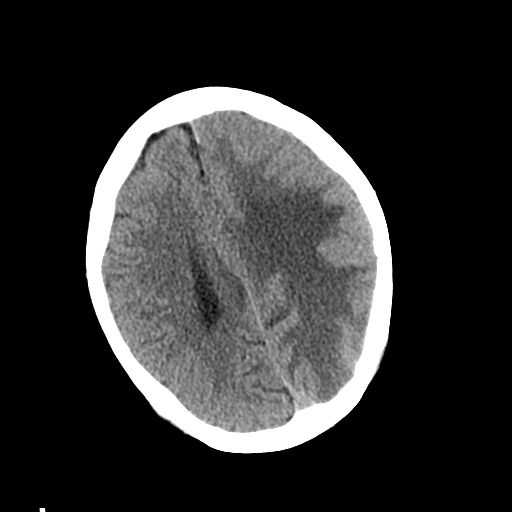
[im 24/33  brain]
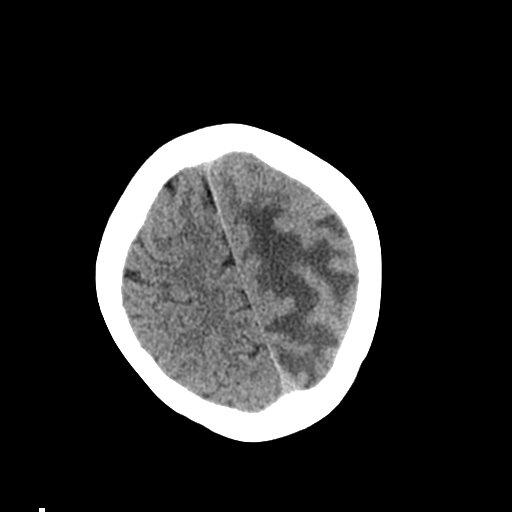
[im 27/33  brain]
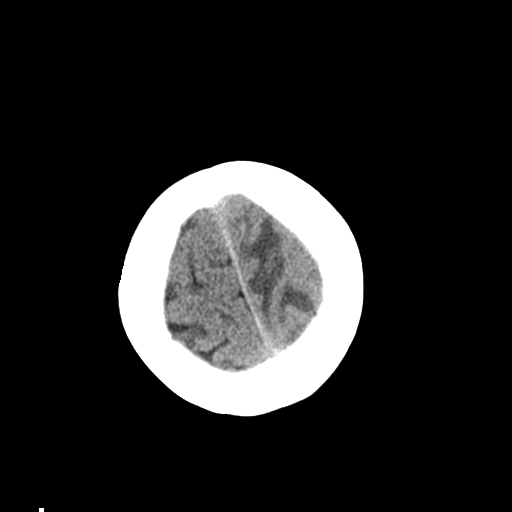
[im 31/33  brain]
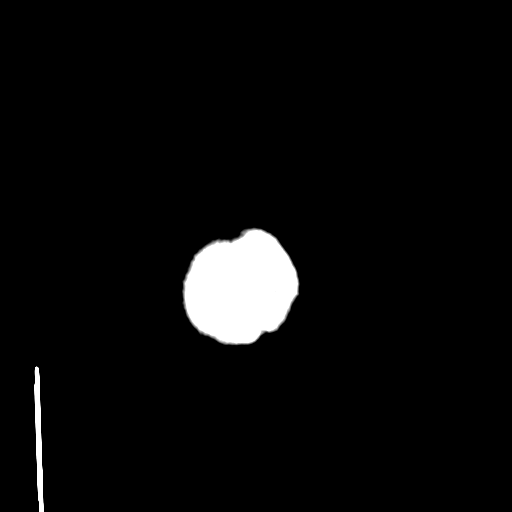
[im 31/33  bone]
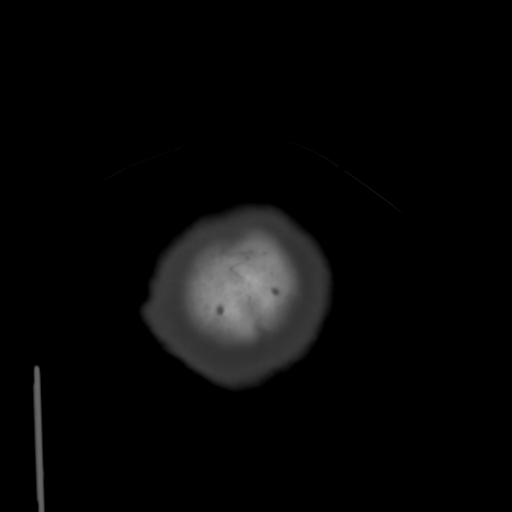

[Series 9: coronal soft · coronal · 0.30mm/px · 3 of 73 slices shown]
[im 25/73  brain]
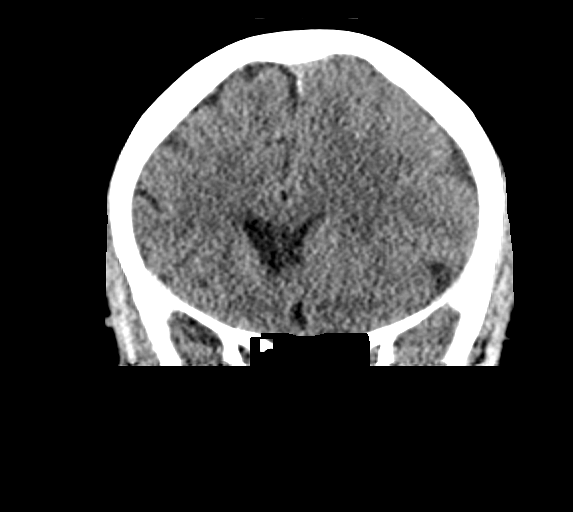
[im 33/73  brain]
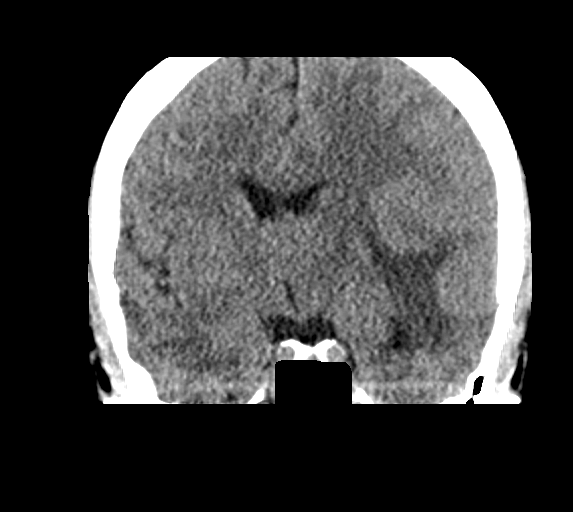
[im 41/73  brain]
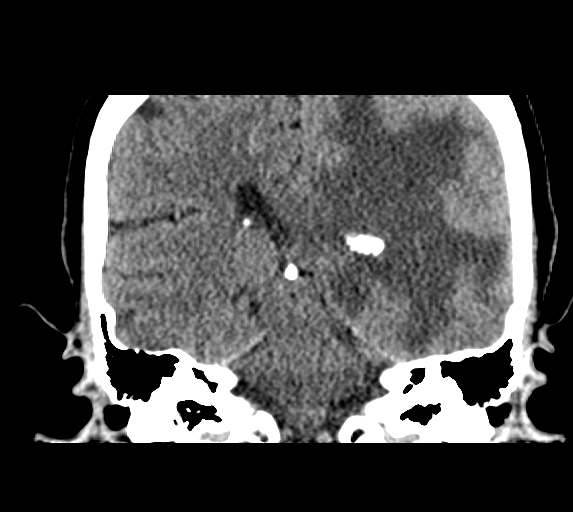

[Series 10: sagittal soft · sagittal · 0.32mm/px · 3 of 62 slices shown]
[im 16/62  brain]
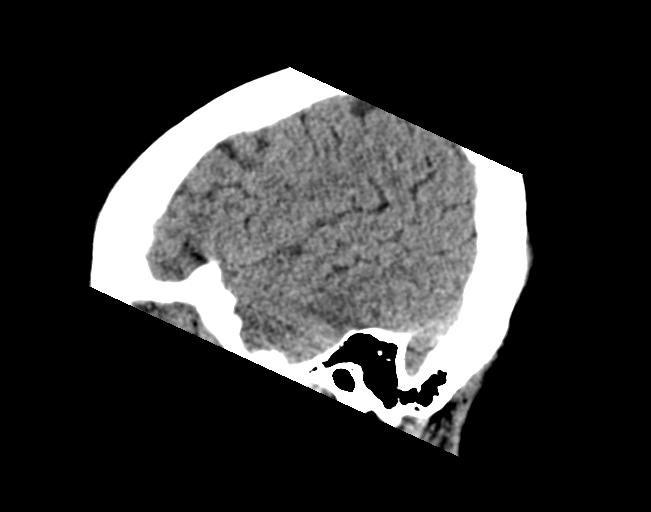
[im 31/62  brain]
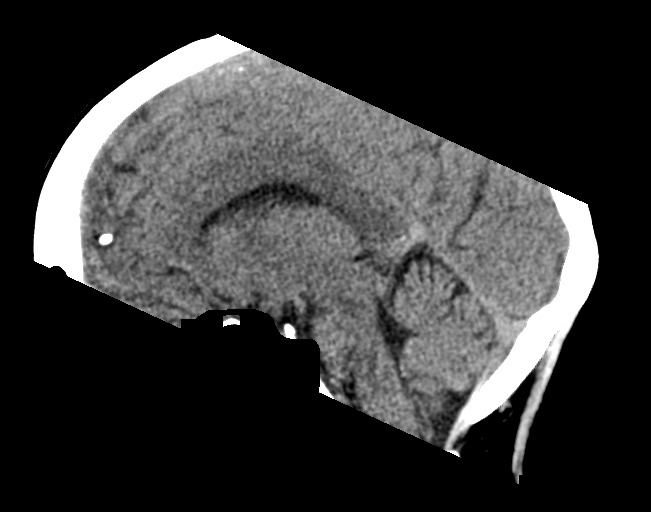
[im 46/62  brain]
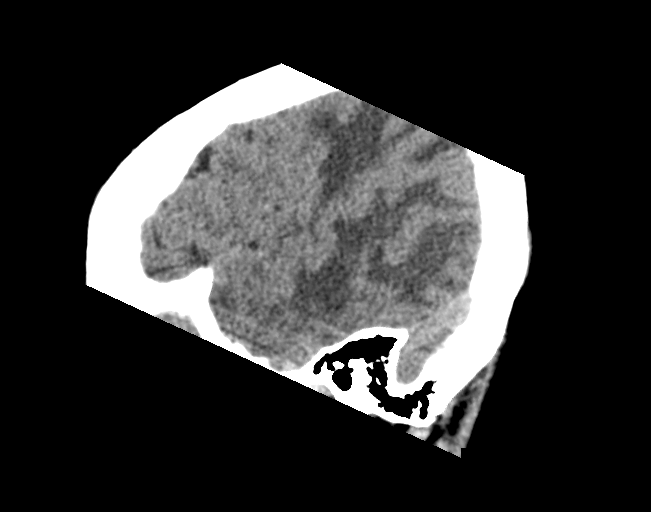

[15 of 47 positions shown; findings below may reference images not displayed]

FINDINGS: Brain: There is white matter hypodensity throughout the left frontal
parietal and temporal lobes with preservation of gray-white matter
distinction concerning for vasogenic edema. There is associated
sulcal effacement and mass effect with 6 mm of midline shift to the
right. There is mild compression of the left lateral ventricle. The
ventricles are normal in size. There is no acute intracranial
hemorrhage or extra-axial fluid collection.

Vascular: No hyperdense vessel or unexpected calcification.

Skull: Normal. Negative for fracture or focal lesion.

Sinuses/Orbits: No acute finding.

Other: None.
IMPRESSION: 1. Large area of hypodensity in the left frontal, parietal and
temporal lobe worrisome for vasogenic edema. There is mass effect
with 6 mm of midline shift to the right. Recommend further
evaluation with MRI with and without contrast to evaluate for
metastatic disease or other underlying brain lesion.
2. No acute intracranial hemorrhage.

## 2021-03-15 IMAGING — DX DG CHEST 1V PORT
1 series · 1 of 1 positions shown · non-contrast
Comparison: Chest x-ray [DATE].

CLINICAL DATA: Cough and weakness.

EXAM:
PORTABLE CHEST 1 VIEW

[chest ap]
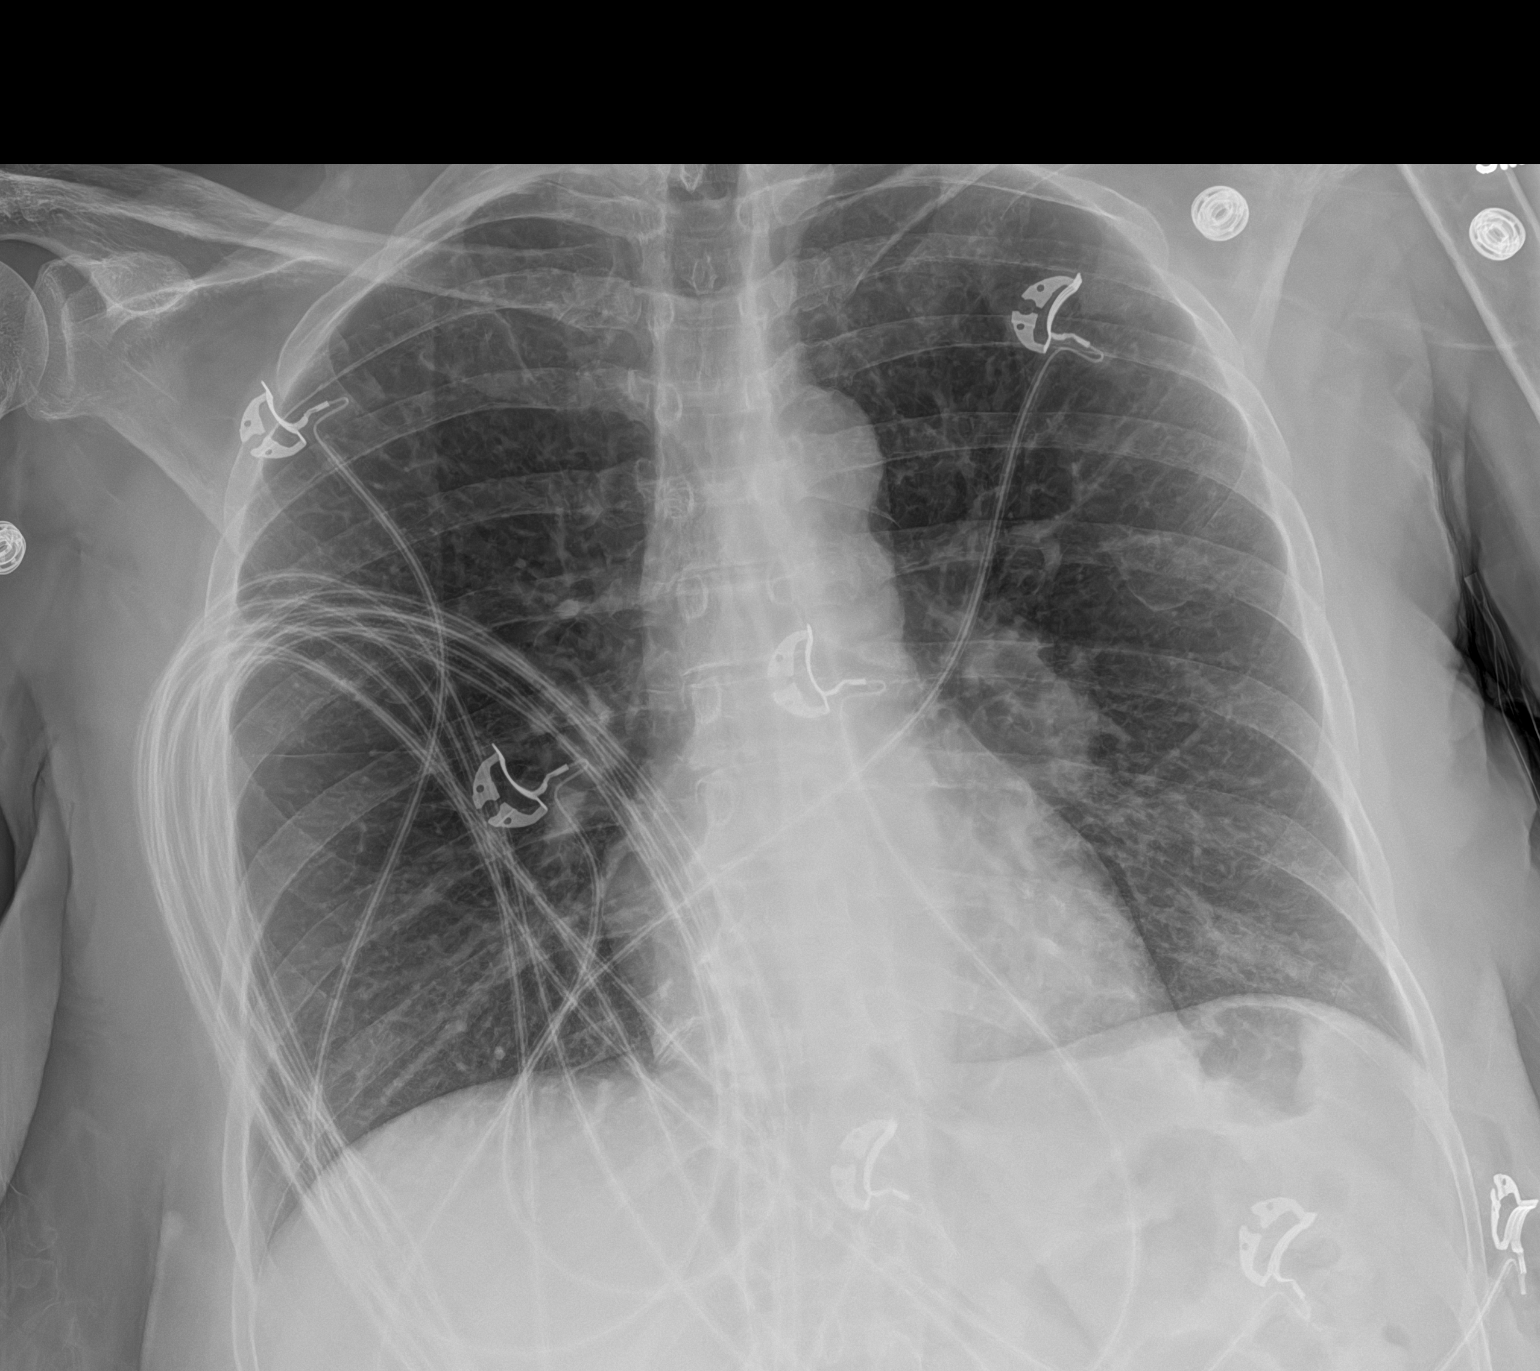

[1 of 1 positions shown; findings below may reference images not displayed]

FINDINGS: There are minimal strandy opacities in the left lung base favored is
atelectasis. The lungs are otherwise clear. There is no pleural
effusion or pneumothorax. The cardiomediastinal silhouette is within
normal limits. No acute fractures.
IMPRESSION: 1. Left basilar atelectasis.

## 2021-03-15 IMAGING — CT CT CHEST W/ CM
2 of 3 series · 15 of 36 positions shown, 18 images · IV contrast (omnipaque)
Comparison: Chest x-ray from earlier in the same day, CT from
[DATE].

CLINICAL DATA: History of recent COVID diagnosis 2 weeks ago with
increased weakness

EXAM:
CT CHEST WITH CONTRAST
TECHNIQUE: Multidetector CT imaging of the chest was performed during
intravenous contrast administration.
CONTRAST:  75mL OMNIPAQUE IOHEXOL 300 MG/ML  SOLN

[Series 3: routine chest with · axial · 0.75mm/px · z∈[-156,+102]mm · 12 of 153 slices shown, 15 images]
[im 12/153  mediastinal]
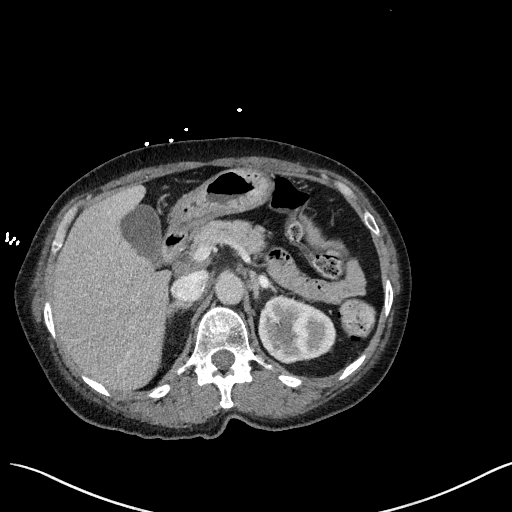
[im 12/153  lung]
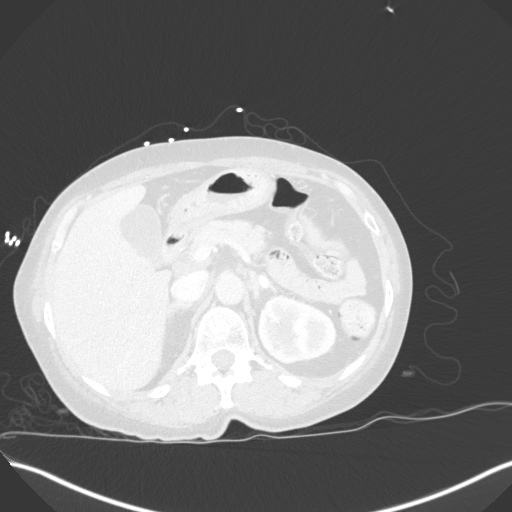
[im 23/153  lung]
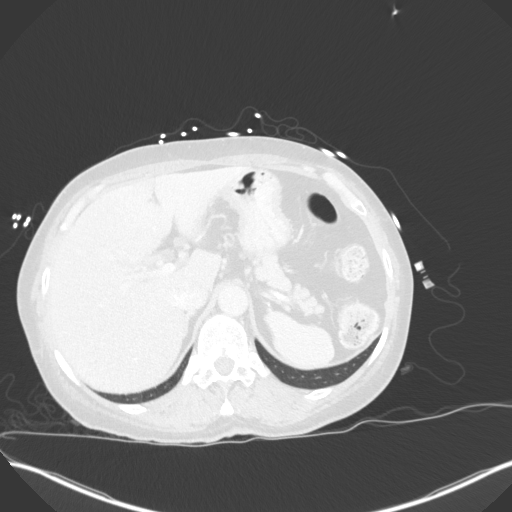
[im 34/153  lung]
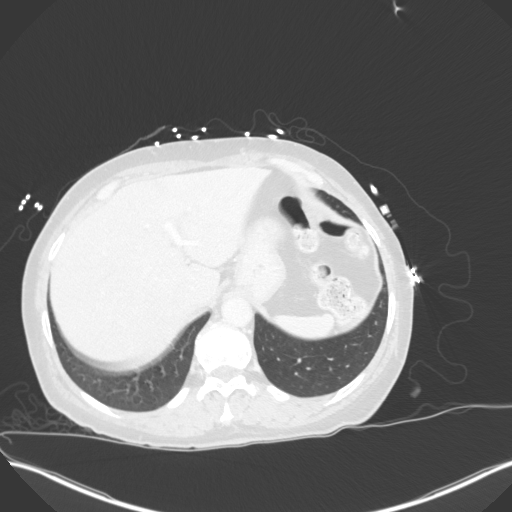
[im 46/153  lung]
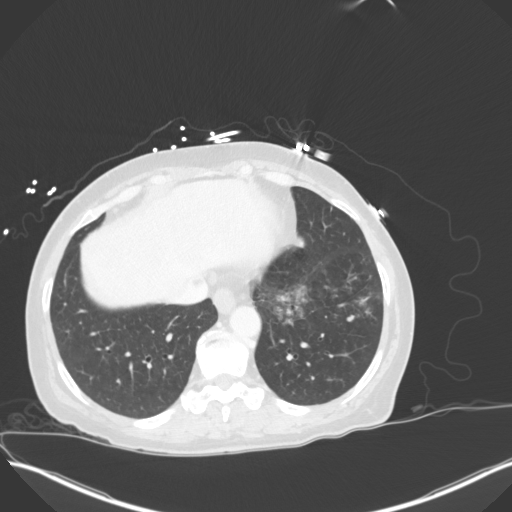
[im 57/153  mediastinal]
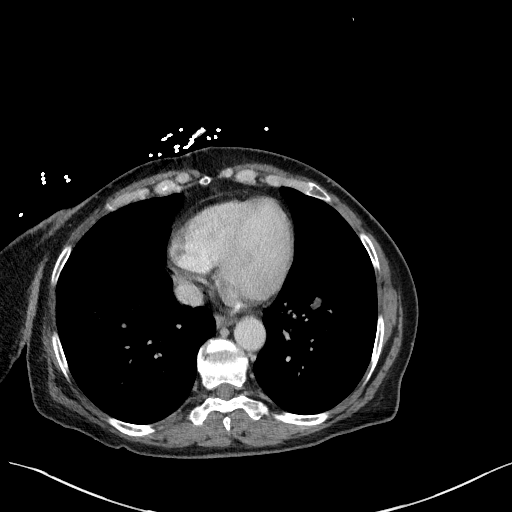
[im 57/153  lung]
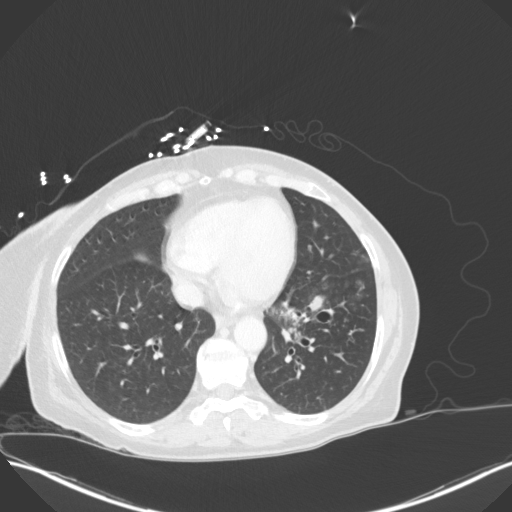
[im 68/153  lung]
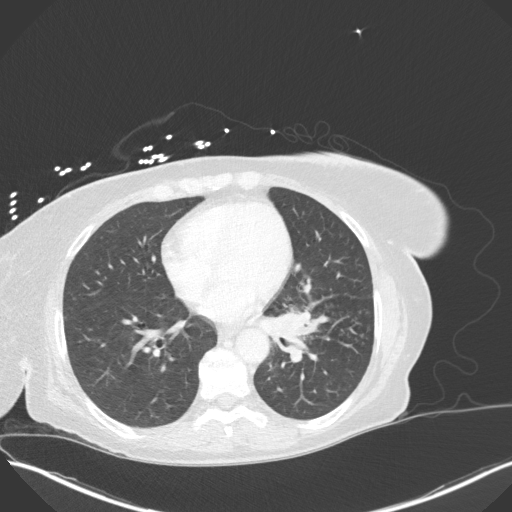
[im 85/153  lung]
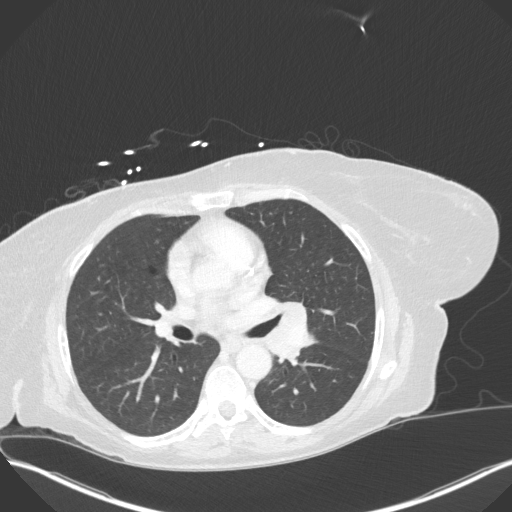
[im 96/153  lung]
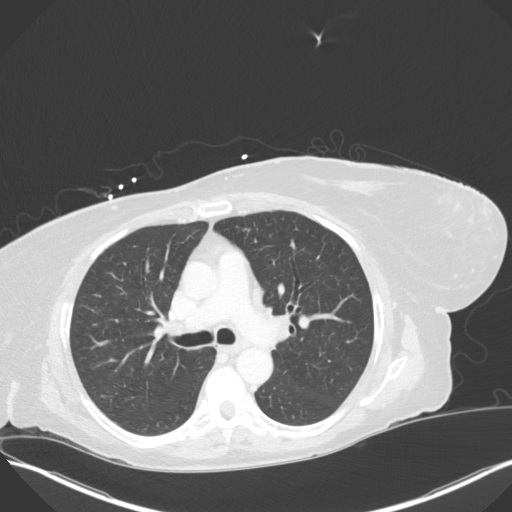
[im 107/153  mediastinal]
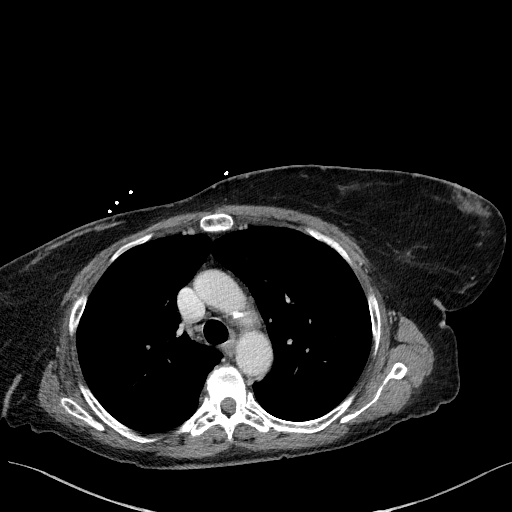
[im 107/153  lung]
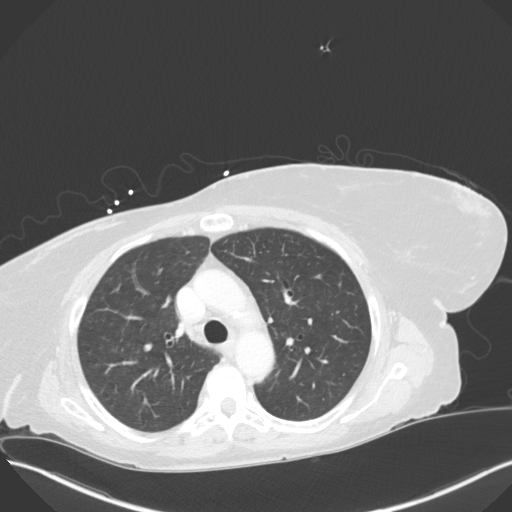
[im 119/153  lung]
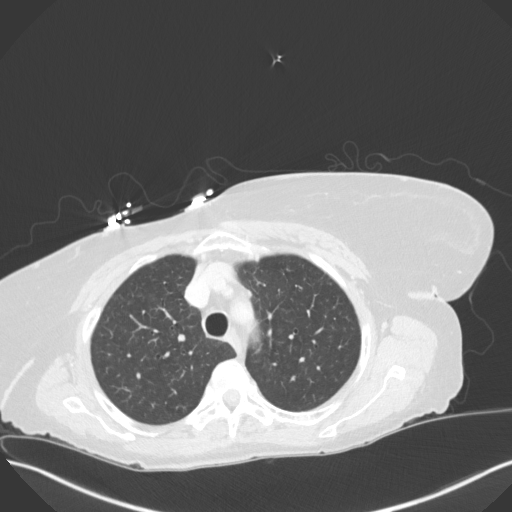
[im 130/153  lung]
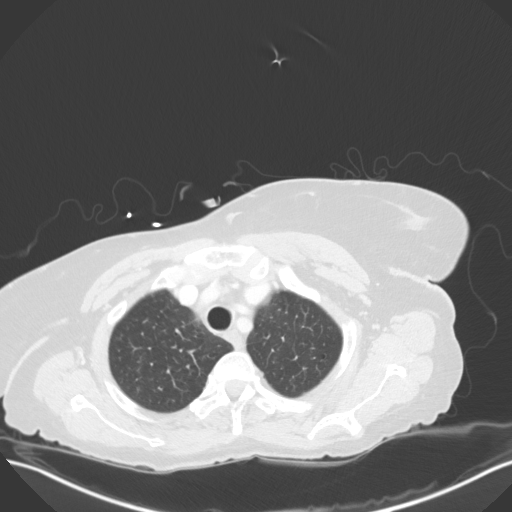
[im 141/153  lung]
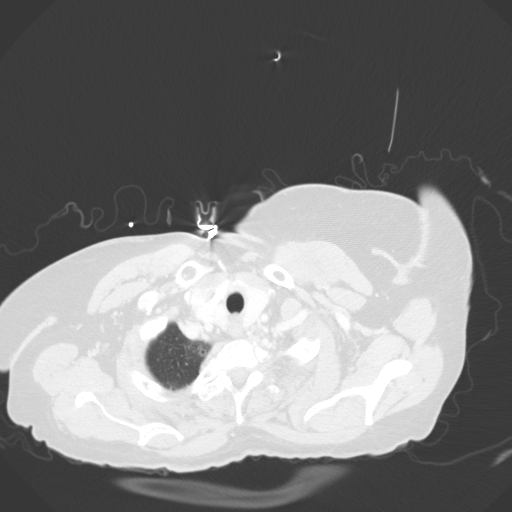

[Series 6: coronal · coronal · 0.68mm/px · 3 of 134 slices shown]
[im 27/134  lung]
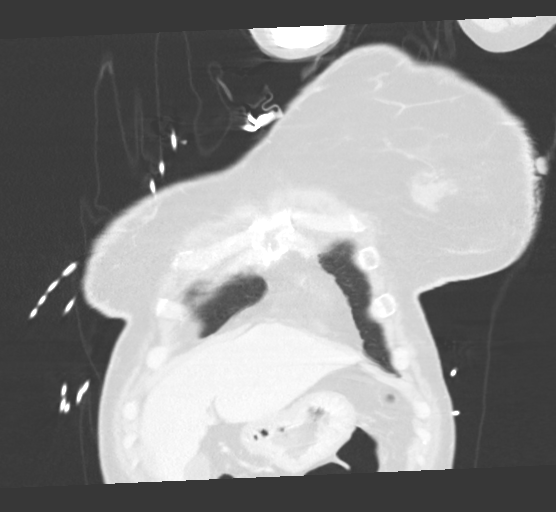
[im 54/134  lung]
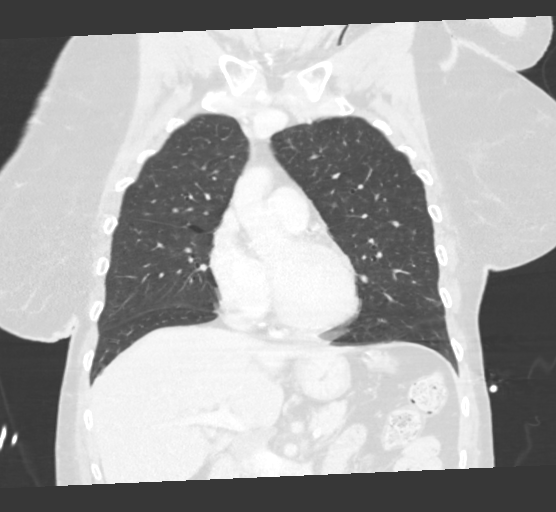
[im 80/134  lung]
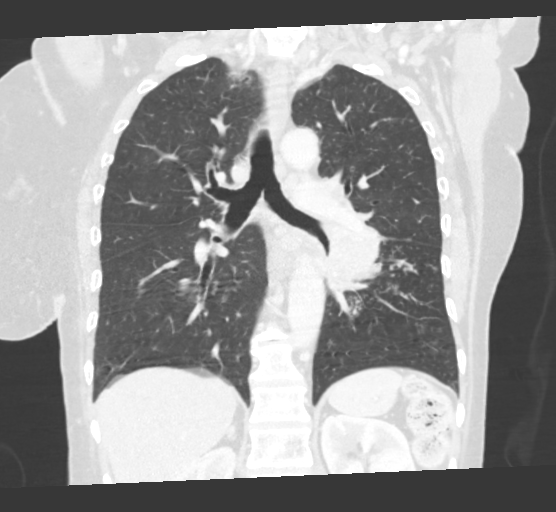

[15 of 36 positions shown; findings below may reference images not displayed]

FINDINGS: Cardiovascular: Thoracic aorta shows no aneurysmal dilatation or
dissection. Pulmonary artery as visualized is within normal limits.
Mild coronary calcifications are noted. No cardiac enlargement is
seen.

Mediastinum/Nodes: The thyroid gland demonstrates mild heterogeneity
with a 17 mm bilobed partially calcified nodule within the right
lobe of the thyroid. The overall appearance has decreased in the
interval from a prior exam from [8F]. Scattered small mediastinal
lymph nodes are noted which appears stable when compared with the
prior exam. The esophagus as visualized is within normal limits.
Prominent left hilar lymph node is noted along the anterior aspect
of the left pulmonary artery measuring 12 mm but previously shown to
be hypermetabolic on prior PET-CT.

Lungs/Pleura: Lungs are well aerated bilaterally. There is again a
mass lesion identified in the left hilum/infrahilar region which
measures 4.4 x 3.3 cm in greatest dimension. This is similar in
appearance to that seen on prior PET-CT from [8F] and again
suspicious for primary pulmonary neoplasm. Some mild patchy
reticulonodular changes are noted in the left lower lobe likely
related to some postobstructive change. These changes are new from
the prior PET-CT. No other focal nodule is seen.

Upper Abdomen: Mild heterogeneity of the liver is noted without
focal mass. Gallbladder is within normal limits. The remainder of
the upper abdomen is unremarkable.

Musculoskeletal: Degenerative changes of the thoracic spine are
seen. No acute rib abnormality is noted. No compression deformities
are noted.
IMPRESSION: Left hilar/infrahilar mass lesion measuring 4.4 x 3.3 cm. This is
again consistent with a primary pulmonary neoplasm. Associated hilar
and mediastinal lymph nodes are noted. Correlate with the clinical
history as to any prior workup following previous PET-CT in [8F].
Some new postobstructive inflammatory changes noted in the left
lower lobe when compared with the prior PET-CT.

Focal 17 mm nodule in the right lobe of the thyroid. This is
decreased in size when compared with prior CT of the chest from
[8F]. Recommend nonemergent thyroid US (ref: [HOSPITAL]. [8F]

Heterogeneity of the liver without focal mass.

## 2021-03-15 MED ORDER — ONDANSETRON HCL 4 MG/2ML IJ SOLN
4.0000 mg | Freq: Once | INTRAMUSCULAR | Status: AC
  Administered 2021-03-15: 17:00:00 4 mg via INTRAVENOUS
  Filled 2021-03-15: qty 2

## 2021-03-15 MED ORDER — IOHEXOL 300 MG/ML  SOLN
75.0000 mL | Freq: Once | INTRAMUSCULAR | Status: AC | PRN
  Administered 2021-03-15: 19:00:00 75 mL via INTRAVENOUS

## 2021-03-15 MED ORDER — CEPHALEXIN 500 MG PO CAPS: 500.0000 mg | ORAL_CAPSULE | Freq: Two times a day (BID) | ORAL | 0 refills | Status: AC

## 2021-03-15 MED ORDER — SODIUM CHLORIDE 0.9 % IV BOLUS
1000.0000 mL | Freq: Once | INTRAVENOUS | Status: AC
  Administered 2021-03-15: 17:00:00 1000 mL via INTRAVENOUS

## 2021-03-15 MED ORDER — ACETAMINOPHEN 325 MG PO TABS
650.0000 mg | ORAL_TABLET | Freq: Once | ORAL | Status: AC
  Administered 2021-03-15: 17:00:00 650 mg via ORAL
  Filled 2021-03-15: qty 2

## 2021-03-15 MED ORDER — CEPHALEXIN 500 MG PO CAPS
500.0000 mg | ORAL_CAPSULE | Freq: Once | ORAL | Status: AC
  Administered 2021-03-15: 20:00:00 500 mg via ORAL
  Filled 2021-03-15: qty 1

## 2021-03-15 MED ORDER — DEXAMETHASONE SODIUM PHOSPHATE 4 MG/ML IJ SOLN
4.0000 mg | Freq: Once | INTRAMUSCULAR | Status: AC
  Administered 2021-03-15: 21:00:00 4 mg via INTRAVENOUS
  Filled 2021-03-15: qty 1

## 2021-03-15 MED ORDER — DEXAMETHASONE 4 MG PO TABS: 4.0000 mg | ORAL_TABLET | Freq: Two times a day (BID) | ORAL | 0 refills | Status: DC

## 2021-03-15 NOTE — ED Provider Notes (Signed)
[facility] Provider Note   CSN: [id number] Arrival date & time: 03/15/21  1515     History Chief Complaint  Patient presents with   Headache    Sandra Horne is a 65 y.o. female with a history significant for lung mass which was diagnosed 1 year ago, also COVID-19 which was diagnosed 10 days ago here presenting for evaluation of persistent generalized weakness, headache across her forehead and reduced appetite.  She denies nausea or vomiting, has been afebrile, denies shortness of breath, coughing, no abdominal pain and has been urinating normally without dysuria.  She simply states she has a poor appetite.  Her last oral intake was orange juice around noon today.  She denies focal weakness, fevers or chills.  She has had no medications for her symptoms prior to arrival.  Review of the chart indicates that she was lost to follow-up care with Dr. Delton Coombes of oncology, when asked about this she states "I thought it would just go away".  The history is provided by the patient.      History reviewed. No pertinent past medical history.  Patient Active Problem List   Diagnosis Date Noted   Mass of left lung 01/14/2020   CLOSED FRACTURE OF UNSPECIFIED PART OF HUMERUS 04/01/2010    Past Surgical History:  Procedure Laterality Date   ECTOPIC PREGNANCY SURGERY       OB History   No obstetric history on file.     Family History  Problem Relation Age of Onset   Hypertension Mother    Stroke Mother    Hypertension Father    Cancer Sister    Cancer Paternal Uncle     Social History   Tobacco Use   Smoking status: Some Days    Packs/day: 0.25    Years: 40.00    Pack years: 10.00    Types: Cigarettes   Smokeless tobacco: Never  Vaping Use   Vaping Use: Never used  Substance Use Topics   Alcohol use: Yes    Comment: drinks beer rarely    Drug use: Not Currently    Types: Marijuana    Comment: last marijuana use mid 11/19/2019    Home  Medications Prior to Admission medications   Medication Sig Start Date End Date Taking? Authorizing Provider  polyethylene glycol (MIRALAX) 17 g packet Take 17 g by mouth daily. 03/05/21   Jeanell Sparrow, DO    Allergies    Patient has no known allergies.  Review of Systems   Review of Systems  Constitutional:  Positive for appetite change and fatigue. Negative for fever.  HENT:  Negative for congestion and sore throat.   Eyes: Negative.  Negative for visual disturbance.  Respiratory:  Negative for chest tightness and shortness of breath.   Cardiovascular:  Negative for chest pain, palpitations and leg swelling.  Gastrointestinal:  Negative for abdominal pain, nausea and vomiting.  Genitourinary: Negative.  Negative for decreased urine volume and dysuria.  Musculoskeletal:  Negative for arthralgias, joint swelling and neck pain.  Skin: Negative.  Negative for rash and wound.  Neurological:  Positive for headaches. Negative for dizziness, weakness, light-headedness and numbness.  Psychiatric/Behavioral: Negative.     Physical Exam Updated Vital Signs BP 128/69 (BP Location: Right Arm)    Pulse 80    Temp 98 F (36.7 C) (Oral)    Resp (!) 21    Wt 65.8 kg    SpO2 99%    BMI 24.13 kg/m  Physical Exam Vitals and nursing note reviewed.  Constitutional:      Appearance: She is well-developed.  HENT:     Head: Normocephalic and atraumatic.     Right Ear: Tympanic membrane normal.     Left Ear: Tympanic membrane normal.  Eyes:     Extraocular Movements: Extraocular movements intact.     Pupils: Pupils are equal, round, and reactive to light.  Cardiovascular:     Rate and Rhythm: Normal rate.     Heart sounds: Normal heart sounds.  Pulmonary:     Effort: Pulmonary effort is normal.  Abdominal:     Palpations: Abdomen is soft.     Tenderness: There is no abdominal tenderness.  Musculoskeletal:        General: Normal range of motion.     Cervical back: Normal range of motion  and neck supple.  Lymphadenopathy:     Cervical: No cervical adenopathy.  Skin:    General: Skin is warm and dry.     Findings: No rash.  Neurological:     General: No focal deficit present.     Mental Status: She is alert and oriented to person, place, and time.     GCS: GCS eye subscore is 4. GCS verbal subscore is 5. GCS motor subscore is 6.     Cranial Nerves: No cranial nerve deficit.     Sensory: No sensory deficit.     Coordination: Coordination normal.     Gait: Gait normal.     Deep Tendon Reflexes: Reflexes normal.     Comments: Normal heel-shin, normal rapid alternating movements. Cranial nerves III-XII intact.  No pronator drift.  Psychiatric:        Speech: Speech normal.        Behavior: Behavior normal.        Thought Content: Thought content normal.    ED Results / Procedures / Treatments   Labs (all labs ordered are listed, but only abnormal results are displayed) Labs Reviewed  CBC WITH DIFFERENTIAL/PLATELET - Abnormal; Notable for the following components:      Result Value   Platelets 459 (*)    All other components within normal limits  BASIC METABOLIC PANEL - Abnormal; Notable for the following components:   Sodium 133 (*)    Chloride 97 (*)    Glucose, Bld 119 (*)    All other components within normal limits  URINALYSIS, ROUTINE W REFLEX MICROSCOPIC - Abnormal; Notable for the following components:   APPearance HAZY (*)    Hgb urine dipstick SMALL (*)    Ketones, ur 5 (*)    Bacteria, UA MANY (*)    All other components within normal limits  URINE CULTURE    EKG None  Radiology DG Chest Port 1 View  Result Date: 03/15/2021 CLINICAL DATA:  Cough and weakness. EXAM: PORTABLE CHEST 1 VIEW COMPARISON:  Chest x-ray 04/23/2012. FINDINGS: There are minimal strandy opacities in the left lung base favored is atelectasis. The lungs are otherwise clear. There is no pleural effusion or pneumothorax. The cardiomediastinal silhouette is within normal  limits. No acute fractures. IMPRESSION: 1. Left basilar atelectasis. Electronically Signed   By: Ronney Asters M.D.   On: 03/15/2021 16:45    Procedures Procedures   Medications Ordered in ED Medications  cephALEXin (KEFLEX) capsule 500 mg (has no administration in time range)  sodium chloride 0.9 % bolus 1,000 mL (0 mLs Intravenous Stopped 03/15/21 1847)  ondansetron (ZOFRAN) injection 4 mg (4 mg Intravenous  Given 03/15/21 1642)  acetaminophen (TYLENOL) tablet 650 mg (650 mg Oral Given 03/15/21 1642)  iohexol (OMNIPAQUE) 300 MG/ML solution 75 mL (75 mLs Intravenous Contrast Given 03/15/21 1854)    ED Course  I have reviewed the triage vital signs and the nursing notes.  Pertinent labs & imaging results that were available during my care of the patient were reviewed by me and considered in my medical decision making (see chart for details).    MDM Rules/Calculators/A&P                         After labs resulted and IV fluids given, Tylenol given patient was reassessed.  Her headache had improved.  However at reexamination patient seemed mildly confused, she had difficulty expressing answers to my questions.  She does endorse that she has been having some episodes of confusion since she was diagnosed with COVID.  I repeated some of her neurologic tests including heel shin and pronator drift.  She was able to complete pronator drift without difficulty and without abnormality.  However she was unable to understand my instructions for heel-to-shin testing.  She has no facial droop, no focal weakness, moving all 4 extremities without deficit.  Call was placed to her husband who is currently at home.  When asked about confusion he states that she has been having some mild episodes of forgetfulness over the past several weeks.  Her urinalysis is now back and it does show she has a UTI.  She was given a dose of Keflex.  Given her history of lung mass CT imaging was ordered including head CT and a  chest CT scan.  Her confusion could also be due to her COVID infection.  Pending CT results at this time.  Pt discussed with Will Ileene Patrick PA-C who assumes care.    Final Clinical Impression(s) / ED Diagnoses Final diagnoses:  None    Rx / DC Orders ED Discharge Orders     None        Landis Martins 03/15/21 Guy Franco, MD 03/15/21 2154

## 2021-03-15 NOTE — ED Triage Notes (Signed)
Diagnosed with covid 2 weeks ago, c/o weakness, inability to eat and now has a headache

## 2021-03-15 NOTE — ED Provider Notes (Signed)
Received at shift change from Jerline Pain, PA-C please see her note for full detail  In short patient with medical history including possible lung cancer presents with complaints of generalized weakness and a headache, this is beyond since she was diagnosed COVID about 10 days ago, she has no other complaints at this time.  Per previous provider following up on CT scans, she was treated for UTI,   CT of chest reveals left hilar infrahilar mass lesion 4.4 x 3.3 consistent with primary pulmonary needed neoplasm associated ESI lymph nodes are noted.  CT of head reveals large area of hypodensity in the left frontal lobe parietal and temporal lobe worrisome for vasogenic edema there is a mass-effect with 6 mm of midline shift to the right recommend further evaluation with MRI without contrast to evaluate for metastatic disease or underlying lesions.  Spoke with Dr. Delton Coombes of oncology he will follow-up with the patient, he recommends starting patient on steroids.  Updated patient on lab work and imaging, patient states that she would rather go home, I find this acceptable as there is no focal deficit present on my exam, lab work is reassuring.  I stressed to her that she needs close follow-up with oncology, I have sent a secure message to Dr. Delton Coombes the oncologist who was following her in the past and also gave her a stat referral.  She will follow-up with them for further evaluation.  Gave her strict return precautions.  will also have her start Keflex for possible UTI.  Vital signs have remained stable, no indication for hospital admission.  Patient discussed with attending and they agreed with assessment and plan.  Patient given at home care as well strict return precautions.  Patient verbalized that they understood agreed to said plan.      Marcello Fennel, PA-C 03/15/21 2142    Daleen Bo, MD 03/15/21 2155

## 2021-03-15 NOTE — Discharge Instructions (Addendum)
Lab work shows that you have a UTI, started on antibiotics please take as prescribed.  Imaging reveals that you have a mass in your lungs concerning for lung cancer, also shows that you have a mass in your brain concerning for cancer as well.  I have started you on steroids please take as prescribed.  It is imperative that you follow-up with oncology for further evaluation given the contact number above please call  Come back to the emergency department if you develop chest pain, shortness of breath, severe abdominal pain, uncontrolled nausea, vomiting, diarrhea.

## 2021-03-17 ENCOUNTER — Encounter (HOSPITAL_COMMUNITY): Payer: Self-pay

## 2021-03-17 NOTE — Progress Notes (Signed)
ED Provider reached out to [facility] stating that patient needs follow-up appointment with Dr. Delton Coombes. Numerous attempts made by Navigator and Scheduling, unable to reach patient and VM box full at this time.

## 2021-03-17 NOTE — Progress Notes (Shared)
[place], [place] [address]   CLINIC:  Medical Oncology/Hematology  PCP:  Pcp, No None None   REASON FOR VISIT:  Follow-up for ***  PRIOR THERAPY: ***  NGS Results: ***  CURRENT THERAPY: ***  BRIEF ONCOLOGIC HISTORY:  Oncology History   No history exists.    CANCER STAGING: Cancer Staging  No matching staging information was found for the patient.  INTERVAL HISTORY:  [name], a 65 y.o. female, returns for routine follow-up of her ***. Nova was last seen on {XX/XX/XXXX}.    REVIEW OF SYSTEMS:  Review of Systems - Oncology  PAST MEDICAL/SURGICAL HISTORY:  No past medical history on file. Past Surgical History:  Procedure Laterality Date   ECTOPIC PREGNANCY SURGERY      SOCIAL HISTORY:  Social History   Socioeconomic History   Marital status: Widowed    Spouse name: Not on file   Number of children: Not on file   Years of education: Not on file   Highest education level: Not on file  Occupational History   Not on file  Tobacco Use   Smoking status: Some Days    Packs/day: 0.25    Years: 40.00    Pack years: 10.00    Types: Cigarettes   Smokeless tobacco: Never  Vaping Use   Vaping Use: Never used  Substance and Sexual Activity   Alcohol use: Yes    Comment: drinks beer rarely    Drug use: Not Currently    Types: Marijuana    Comment: last marijuana use mid 11/19/2019   Sexual activity: Yes    Birth control/protection: None  Other Topics Concern   Not on file  Social History Narrative   Not on file   Social Determinants of Health   Financial Resource Strain: Not on file  Food Insecurity: Not on file  Transportation Needs: Not on file  Physical Activity: Not on file  Stress: Not on file  Social Connections: Not on file  Intimate Partner Violence: Not on file    FAMILY HISTORY:  Family History  Problem Relation Age of Onset   Hypertension Mother    Stroke Mother     Hypertension Father    Cancer Sister    Cancer Paternal Uncle     CURRENT MEDICATIONS:  Current Outpatient Medications  Medication Sig Dispense Refill   cephALEXin (KEFLEX) 500 MG capsule Take 1 capsule (500 mg total) by mouth 2 (two) times daily for 7 days. 14 capsule 0   dexamethasone (DECADRON) 4 MG tablet Take 1 tablet (4 mg total) by mouth 2 (two) times daily with a meal. 60 tablet 0   polyethylene glycol (MIRALAX) 17 g packet Take 17 g by mouth daily. 14 each 0   No current facility-administered medications for this visit.    ALLERGIES:  No Known Allergies  PHYSICAL EXAM:  Performance status (ECOG): {CHL ONC OE:[id number]}  There were no vitals filed for this visit. Wt Readings from Last 3 Encounters:  03/15/21 145 lb (65.8 kg)  03/05/21 145 lb (65.8 kg)  01/14/20 163 lb 6.4 oz (74.1 kg)   Physical Exam   LABORATORY DATA:  I have reviewed the labs as listed.  CBC Latest Ref Rng & Units 03/15/2021 12/13/2019 04/23/2012  WBC 4.0 - 10.5 K/uL 6.1 9.3 5.9  Hemoglobin 12.0 - 15.0 g/dL 13.4 12.5 13.6  Hematocrit 36.0 - 46.0 % 41.6 40.3 40.5  Platelets 150 - 400 K/uL 459(H) 394  372   CMP Latest Ref Rng & Units 03/15/2021 12/13/2019 04/23/2012  Glucose 70 - 99 mg/dL 119(H) 117(H) 108(H)  BUN 8 - 23 mg/dL 11 9 10   Creatinine 0.44 - 1.00 mg/dL 0.73 0.60 0.79  Sodium 135 - 145 mmol/L 133(L) 139 137  Potassium 3.5 - 5.1 mmol/L 4.1 3.8 3.9  Chloride 98 - 111 mmol/L 97(L) 106 101  CO2 22 - 32 mmol/L 26 25 27   Calcium 8.9 - 10.3 mg/dL 9.4 9.0 9.4  Total Protein 6.5 - 8.1 g/dL - 8.2(H) -  Total Bilirubin 0.3 - 1.2 mg/dL - 0.9 -  Alkaline Phos 38 - 126 U/L - 86 -  AST 15 - 41 U/L - 19 -  ALT 0 - 44 U/L - 22 -    DIAGNOSTIC IMAGING:  I have independently reviewed the scans and discussed with the patient. DG Abdomen 1 View  Result Date: 03/05/2021 CLINICAL DATA:  Abdominal pain, constipation. EXAM: ABDOMEN - 1 VIEW COMPARISON:  None. FINDINGS: The bowel gas pattern is normal.  No definite nephrolithiasis is noted. Phleboliths are noted in the pelvis. IMPRESSION: No abnormal bowel dilatation is noted. Electronically Signed   By: Marijo Conception M.D.   On: 03/05/2021 08:59   CT Head Wo Contrast  Result Date: 03/15/2021 CLINICAL DATA:  Headache. COVID 2 weeks ago. History of lung cancer. EXAM: CT HEAD WITHOUT CONTRAST TECHNIQUE: Contiguous axial images were obtained from the base of the skull through the vertex without intravenous contrast. COMPARISON:  CT head 04/23/2012.  MRI brain 01/17/2020. FINDINGS: Brain: There is white matter hypodensity throughout the left frontal parietal and temporal lobes with preservation of gray-white matter distinction concerning for vasogenic edema. There is associated sulcal effacement and mass effect with 6 mm of midline shift to the right. There is mild compression of the left lateral ventricle. The ventricles are normal in size. There is no acute intracranial hemorrhage or extra-axial fluid collection. Vascular: No hyperdense vessel or unexpected calcification. Skull: Normal. Negative for fracture or focal lesion. Sinuses/Orbits: No acute finding. Other: None. IMPRESSION: 1. Large area of hypodensity in the left frontal, parietal and temporal lobe worrisome for vasogenic edema. There is mass effect with 6 mm of midline shift to the right. Recommend further evaluation with MRI with and without contrast to evaluate for metastatic disease or other underlying brain lesion. 2. No acute intracranial hemorrhage. Electronically Signed   By: Ronney Asters M.D.   On: 03/15/2021 19:42   CT Chest W Contrast  Result Date: 03/15/2021 CLINICAL DATA:  History of recent COVID diagnosis 2 weeks ago with increased weakness EXAM: CT CHEST WITH CONTRAST TECHNIQUE: Multidetector CT imaging of the chest was performed during intravenous contrast administration. CONTRAST:  28mL OMNIPAQUE IOHEXOL 300 MG/ML  SOLN COMPARISON:  Chest x-ray from earlier in the same day, CT  from 12/13/2019. FINDINGS: Cardiovascular: Thoracic aorta shows no aneurysmal dilatation or dissection. Pulmonary artery as visualized is within normal limits. Mild coronary calcifications are noted. No cardiac enlargement is seen. Mediastinum/Nodes: The thyroid gland demonstrates mild heterogeneity with a 17 mm bilobed partially calcified nodule within the right lobe of the thyroid. The overall appearance has decreased in the interval from a prior exam from 2021. Scattered small mediastinal lymph nodes are noted which appears stable when compared with the prior exam. The esophagus as visualized is within normal limits. Prominent left hilar lymph node is noted along the anterior aspect of the left pulmonary artery measuring 12 mm but previously shown to be  hypermetabolic on prior PET-CT. Lungs/Pleura: Lungs are well aerated bilaterally. There is again a mass lesion identified in the left hilum/infrahilar region which measures 4.4 x 3.3 cm in greatest dimension. This is similar in appearance to that seen on prior PET-CT from 2021 and again suspicious for primary pulmonary neoplasm. Some mild patchy reticulonodular changes are noted in the left lower lobe likely related to some postobstructive change. These changes are new from the prior PET-CT. No other focal nodule is seen. Upper Abdomen: Mild heterogeneity of the liver is noted without focal mass. Gallbladder is within normal limits. The remainder of the upper abdomen is unremarkable. Musculoskeletal: Degenerative changes of the thoracic spine are seen. No acute rib abnormality is noted. No compression deformities are noted. IMPRESSION: Left hilar/infrahilar mass lesion measuring 4.4 x 3.3 cm. 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 Associated hilar and mediastinal lymph nodes are noted. Correlate with the clinical history as to any prior workup following previous PET-CT in 2021. Some new postobstructive inflammatory changes noted in the left  lower lobe when compared with the prior PET-CT. Focal 17 mm nodule in the right lobe of the thyroid. This is decreased in size when compared with prior CT of the chest from 2021. Recommend nonemergent thyroid US (ref: J Am Coll Radiol. 2015 Feb;12(2): 143-50). Heterogeneity of the liver without focal mass. Electronically Signed   By: Inez Catalina M.D.   On: 03/15/2021 19:54   DG Chest Port 1 View  Result Date: 03/15/2021 CLINICAL DATA:  Cough and weakness. EXAM: PORTABLE CHEST 1 VIEW COMPARISON:  Chest x-ray 04/23/2012. FINDINGS: There are minimal strandy opacities in the left lung base favored is atelectasis. The lungs are otherwise clear. There is no pleural effusion or pneumothorax. The cardiomediastinal silhouette is within normal limits. No acute fractures. IMPRESSION: 1. Left basilar atelectasis. Electronically Signed   By: Ronney Asters M.D.   On: 03/15/2021 16:45     ASSESSMENT:  ***   PLAN:  ***   Orders placed this encounter:  No orders of the defined types were placed in this encounter.    Derek Jack, MD [facility] [phone]   I, ***, am acting as a scribe for Dr. Derek Jack.  {Add Barista Statement}

## 2021-03-18 ENCOUNTER — Ambulatory Visit (HOSPITAL_COMMUNITY): Payer: Self-pay | Admitting: Hematology

## 2021-03-18 LAB — URINE CULTURE: Culture: >=100000 — AB

## 2021-03-19 ENCOUNTER — Telehealth: Payer: Self-pay | Admitting: *Deleted

## 2021-03-19 NOTE — Progress Notes (Signed)
ED Antimicrobial Stewardship Positive Culture Follow Up   Sandra Horne is an 65 y.o. female who presented to [facility] on 03/15/2021 with a chief complaint of  Chief Complaint  Patient presents with   Headache    Recent Results (from the past 720 hour(s))  Resp Panel by RT-PCR (Flu A&B, Covid) Nasopharyngeal Swab     Status: Abnormal   Collection Time: 03/05/21  8:39 AM   Specimen: Nasopharyngeal Swab; Nasopharyngeal(NP) swabs in vial transport medium  Result Value Ref Range Status   SARS Coronavirus 2 by RT PCR POSITIVE (A) NEGATIVE Final    Comment: RESULT CALLED TO, READ BACK BY AND VERIFIED WITH: OAKLEY, B. AT 6644 ON 03/05/21 BY BROWNING, D. (NOTE) SARS-CoV-2 target nucleic acids are DETECTED.  The SARS-CoV-2 RNA is generally detectable in upper respiratory specimens during the acute phase of infection. Positive results are indicative of the presence of the identified virus, but do not rule out bacterial infection or co-infection with other pathogens not detected by the test. Clinical correlation with patient history and other diagnostic information is necessary to determine patient infection status. The expected result is Negative.  Fact Sheet for Patients: EntrepreneurPulse.com.au  Fact Sheet for Healthcare Providers: IncredibleEmployment.be  This test is not yet approved or cleared by the [place] FDA and  has been authorized for detection and/or diagnosis of SARS-CoV-2 by FDA under an Emergency Use Authorization (EUA).  This EUA will remain in effect (meaning this  test can be used) for the duration of  the COVID-19 declaration under Section 564(b)(1) of the Act, 21 U.S.C. section 360bbb-3(b)(1), unless the authorization is terminated or revoked sooner.     Influenza A by PCR NEGATIVE NEGATIVE Final   Influenza B by PCR NEGATIVE NEGATIVE Final    Comment: (NOTE) The Xpert Xpress SARS-CoV-2/FLU/RSV plus assay is  intended as an aid in the diagnosis of influenza from Nasopharyngeal swab specimens and should not be used as a sole basis for treatment. Nasal washings and aspirates are unacceptable for Xpert Xpress SARS-CoV-2/FLU/RSV testing.  Fact Sheet for Patients: EntrepreneurPulse.com.au  Fact Sheet for Healthcare Providers: IncredibleEmployment.be  This test is not yet approved or cleared by the [place] FDA and has been authorized for detection and/or diagnosis of SARS-CoV-2 by FDA under an Emergency Use Authorization (EUA). This EUA will remain in effect (meaning this test can be used) for the duration of the COVID-19 declaration under Section 564(b)(1) of the Act, 21 U.S.C. section 360bbb-3(b)(1), unless the authorization is terminated or revoked.  Performed at [facility], [address] [place], [place] [address]   Urine Culture     Status: Abnormal   Collection Time: 03/15/21  4:33 PM   Specimen: Urine, Clean Catch  Result Value Ref Range Status   Specimen Description   Final    URINE, CLEAN CATCH Performed at [facility], [address] [place], [place] [address]    Special Requests   Final    NONE Performed at [facility], [address], [place], [place] [address]    Culture (A)  Final    >=100,000 COLONIES/mL ENTEROCOCCUS FAECALIS >=100,000 COLONIES/mL LACTOBACILLUS SPECIES Standardized susceptibility testing for this organism is not available. Performed at [facility], [place] [address], [place], [place] [address]    Report Status 03/18/2021 FINAL  Final   Organism ID, Bacteria ENTEROCOCCUS FAECALIS (A)  Final      Susceptibility   Enterococcus faecalis - MIC*    AMPICILLIN <=2 SENSITIVE Sensitive  NITROFURANTOIN <=16 SENSITIVE Sensitive     VANCOMYCIN <=0.5 SENSITIVE Sensitive     * >=100,000 COLONIES/mL ENTEROCOCCUS FAECALIS    [x]  Treated with Keflex, organism resistant to prescribed antimicrobial   New  antibiotic prescription: Amoxil  ED Provider: Margarita Mail, PA-C   Sandra Horne 03/19/2021, 12:31 PM Clinical Pharmacist Monday - Friday phone -  [phone] Saturday - Sunday phone - [phone]

## 2021-03-19 NOTE — Telephone Encounter (Signed)
Post ED Visit - Positive Culture Follow-up: Unsuccessful Patient Follow-up  Culture assessed and recommendations reviewed by:  []  Elenor Quinones, Pharm.D. []  Heide Guile, Pharm.D., BCPS AQ-ID []  Parks Neptune, Pharm.D., BCPS []  Alycia Rossetti, Pharm.D., BCPS []  [place], [place].D., BCPS, AAHIVP []  Legrand Como, Pharm.D., BCPS, AAHIVP []  Wynell Balloon, PharmD []  Vincenza Hews, PharmD, BCPS  Positive urine culture  []  Patient discharged without antimicrobial prescription and treatment is now indicated []  Organism is resistant to prescribed ED discharge antimicrobial []  Patient with positive blood cultures  Plan:  D/ C Keflex.  Take Amoxicillin 500mg  PO q8hrs x 5 days.  Unable to contact patient after 3 attempts, letter will be sent to address on file  Ardeen Fillers 03/19/2021, 12:28 PM

## 2021-03-25 ENCOUNTER — Inpatient Hospital Stay (HOSPITAL_COMMUNITY): Payer: Medicaid Other

## 2021-03-25 ENCOUNTER — Inpatient Hospital Stay (HOSPITAL_COMMUNITY)
Admission: EM | Admit: 2021-03-25 | Discharge: 2021-03-28 | DRG: 180 | Disposition: A | Discharge status: Home / Self Care | Payer: Medicaid Other | Attending: Internal Medicine | Admitting: Internal Medicine

## 2021-03-25 ENCOUNTER — Other Ambulatory Visit: Payer: Self-pay

## 2021-03-25 ENCOUNTER — Emergency Department (HOSPITAL_COMMUNITY): Payer: Medicaid Other

## 2021-03-25 ENCOUNTER — Encounter (HOSPITAL_COMMUNITY): Payer: Self-pay | Admitting: Emergency Medicine

## 2021-03-25 DIAGNOSIS — R739 Hyperglycemia, unspecified: Secondary | ICD-10-CM | POA: Diagnosis present

## 2021-03-25 DIAGNOSIS — D509 Iron deficiency anemia, unspecified: Secondary | ICD-10-CM | POA: Diagnosis present

## 2021-03-25 DIAGNOSIS — R918 Other nonspecific abnormal finding of lung field: Secondary | ICD-10-CM

## 2021-03-25 DIAGNOSIS — Z515 Encounter for palliative care: Secondary | ICD-10-CM | POA: Diagnosis not present

## 2021-03-25 DIAGNOSIS — C7931 Secondary malignant neoplasm of brain: Secondary | ICD-10-CM | POA: Diagnosis present

## 2021-03-25 DIAGNOSIS — Z809 Family history of malignant neoplasm, unspecified: Secondary | ICD-10-CM | POA: Diagnosis not present

## 2021-03-25 DIAGNOSIS — T380X5A Adverse effect of glucocorticoids and synthetic analogues, initial encounter: Secondary | ICD-10-CM | POA: Diagnosis present

## 2021-03-25 DIAGNOSIS — Z20822 Contact with and (suspected) exposure to covid-19: Secondary | ICD-10-CM | POA: Diagnosis present

## 2021-03-25 DIAGNOSIS — G9340 Encephalopathy, unspecified: Secondary | ICD-10-CM | POA: Diagnosis not present

## 2021-03-25 DIAGNOSIS — D72829 Elevated white blood cell count, unspecified: Secondary | ICD-10-CM | POA: Diagnosis present

## 2021-03-25 DIAGNOSIS — G9349 Other encephalopathy: Secondary | ICD-10-CM | POA: Diagnosis present

## 2021-03-25 DIAGNOSIS — Z91199 Patient's noncompliance with other medical treatment and regimen due to unspecified reason: Secondary | ICD-10-CM

## 2021-03-25 DIAGNOSIS — Z8249 Family history of ischemic heart disease and other diseases of the circulatory system: Secondary | ICD-10-CM | POA: Diagnosis not present

## 2021-03-25 DIAGNOSIS — Z7189 Other specified counseling: Secondary | ICD-10-CM | POA: Diagnosis not present

## 2021-03-25 DIAGNOSIS — C3402 Malignant neoplasm of left main bronchus: Principal | ICD-10-CM | POA: Diagnosis present

## 2021-03-25 DIAGNOSIS — G936 Cerebral edema: Secondary | ICD-10-CM | POA: Diagnosis present

## 2021-03-25 DIAGNOSIS — Z9889 Other specified postprocedural states: Secondary | ICD-10-CM

## 2021-03-25 DIAGNOSIS — Z823 Family history of stroke: Secondary | ICD-10-CM

## 2021-03-25 DIAGNOSIS — C3490 Malignant neoplasm of unspecified part of unspecified bronchus or lung: Secondary | ICD-10-CM

## 2021-03-25 DIAGNOSIS — C3432 Malignant neoplasm of lower lobe, left bronchus or lung: Secondary | ICD-10-CM

## 2021-03-25 DIAGNOSIS — F17210 Nicotine dependence, cigarettes, uncomplicated: Secondary | ICD-10-CM | POA: Diagnosis present

## 2021-03-25 LAB — CBC WITH DIFFERENTIAL/PLATELET
Abs Immature Granulocytes: 0.23 10*3/uL — ABNORMAL HIGH (ref 0.00–0.07)
Basophils Absolute: 0 10*3/uL (ref 0.0–0.1)
Basophils Relative: 0 %
Eosinophils Absolute: 0.2 10*3/uL (ref 0.0–0.5)
Eosinophils Relative: 2 %
HCT: 35.2 % — ABNORMAL LOW (ref 36.0–46.0)
Hemoglobin: 11.5 g/dL — ABNORMAL LOW (ref 12.0–15.0)
Immature Granulocytes: 2 %
Lymphocytes Relative: 25 %
Lymphs Abs: 3.1 10*3/uL (ref 0.7–4.0)
MCH: 29 pg (ref 26.0–34.0)
MCHC: 32.7 g/dL (ref 30.0–36.0)
MCV: 88.9 fL (ref 80.0–100.0)
Monocytes Absolute: 1.9 10*3/uL — ABNORMAL HIGH (ref 0.1–1.0)
Monocytes Relative: 16 %
Neutro Abs: 6.8 10*3/uL (ref 1.7–7.7)
Neutrophils Relative %: 55 %
Platelets: 413 10*3/uL — ABNORMAL HIGH (ref 150–400)
RBC: 3.96 MIL/uL (ref 3.87–5.11)
RDW: 14.6 % (ref 11.5–15.5)
WBC: 12.3 10*3/uL — ABNORMAL HIGH (ref 4.0–10.5)
nRBC: 0 % (ref 0.0–0.2)

## 2021-03-25 LAB — COMPREHENSIVE METABOLIC PANEL
ALT: 39 U/L (ref 0–44)
AST: 24 U/L (ref 15–41)
Albumin: 2.9 g/dL — ABNORMAL LOW (ref 3.5–5.0)
Alkaline Phosphatase: 70 U/L (ref 38–126)
Anion gap: 5 (ref 5–15)
BUN: 8 mg/dL (ref 8–23)
CO2: 26 mmol/L (ref 22–32)
Calcium: 8.4 mg/dL — ABNORMAL LOW (ref 8.9–10.3)
Chloride: 106 mmol/L (ref 98–111)
Creatinine, Ser: 0.62 mg/dL (ref 0.44–1.00)
GFR, Estimated: >60 mL/min (ref >60)
Glucose, Bld: 127 mg/dL — ABNORMAL HIGH (ref 70–99)
Potassium: 3.9 mmol/L (ref 3.5–5.1)
Sodium: 137 mmol/L (ref 135–145)
Total Bilirubin: 0.5 mg/dL (ref 0.3–1.2)
Total Protein: 6.1 g/dL — ABNORMAL LOW (ref 6.5–8.1)

## 2021-03-25 LAB — RETICULOCYTES
Immature Retic Fract: 12.4 % (ref 2.3–15.9)
RBC.: 3.92 MIL/uL (ref 3.87–5.11)
Retic Count, Absolute: 61.2 10*3/uL (ref 19.0–186.0)
Retic Ct Pct: 1.6 % (ref 0.4–3.1)

## 2021-03-25 LAB — RESP PANEL BY RT-PCR (FLU A&B, COVID) ARPGX2
Influenza A by PCR: NEGATIVE
Influenza B by PCR: NEGATIVE
SARS Coronavirus 2 by RT PCR: NEGATIVE

## 2021-03-25 LAB — HEMOGLOBIN A1C
Hgb A1c MFr Bld: 6.5 % — ABNORMAL HIGH (ref 4.8–5.6)
Mean Plasma Glucose: 139.85 mg/dL

## 2021-03-25 LAB — FOLATE: Folate: 6.6 ng/mL (ref >5.9)

## 2021-03-25 LAB — IRON AND TIBC
Iron: 19 ug/dL — ABNORMAL LOW (ref 28–170)
Saturation Ratios: 8 % — ABNORMAL LOW (ref 10.4–31.8)
TIBC: 246 ug/dL — ABNORMAL LOW (ref 250–450)
UIBC: 227 ug/dL

## 2021-03-25 LAB — VITAMIN B12: Vitamin B-12: 537 pg/mL (ref 180–914)

## 2021-03-25 LAB — FERRITIN: Ferritin: 127 ng/mL (ref 11–307)

## 2021-03-25 LAB — PROTIME-INR
INR: 1 (ref 0.8–1.2)
Prothrombin Time: 13.4 seconds (ref 11.4–15.2)

## 2021-03-25 LAB — HIV ANTIBODY (ROUTINE TESTING W REFLEX): HIV Screen 4th Generation wRfx: NONREACTIVE

## 2021-03-25 IMAGING — CT CT HEAD W/O CM
3 series · 16 of 47 positions shown, 19 images · non-contrast
Comparison: Ten days ago

CLINICAL DATA: Left-sided headache for 3 days

EXAM:
CT HEAD WITHOUT CONTRAST
TECHNIQUE: Contiguous axial images were obtained from the base of the skull
through the vertex without intravenous contrast.

[Series 2: head w o · axial · 0.42mm/px · z∈[-61,+64]mm · 10 of 31 slices shown, 13 images]
[im 3/31  brain]
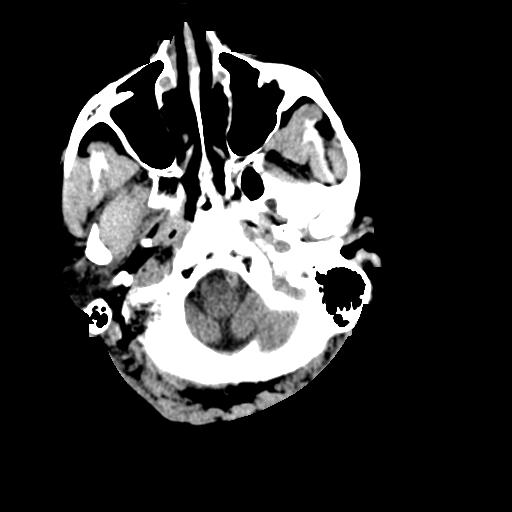
[im 3/31  bone]
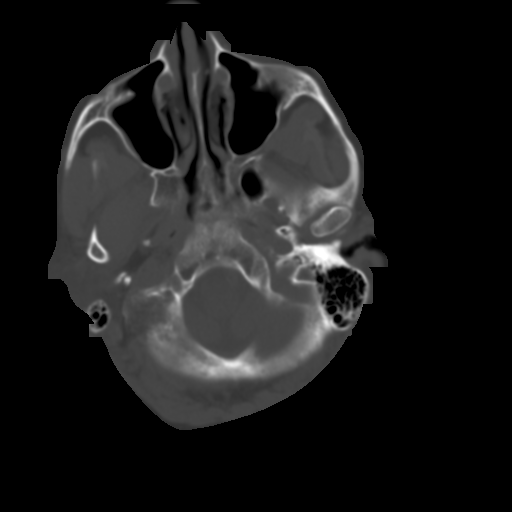
[im 6/31  brain]
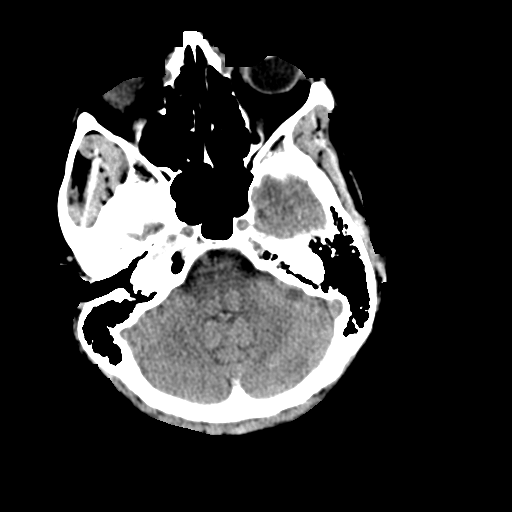
[im 9/31  brain]
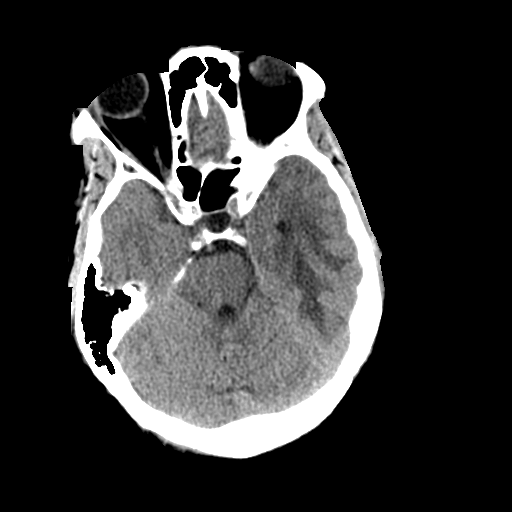
[im 11/31  brain]
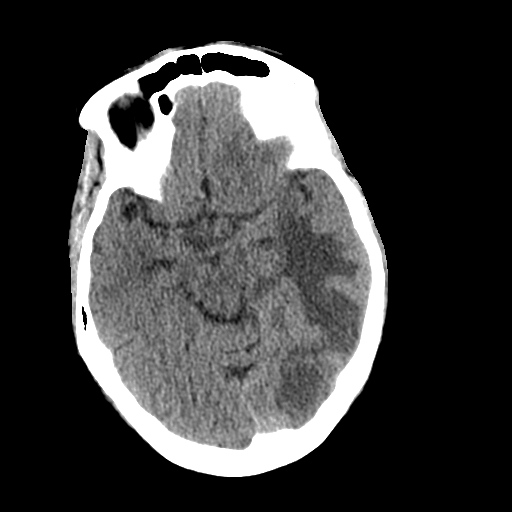
[im 14/31  brain]
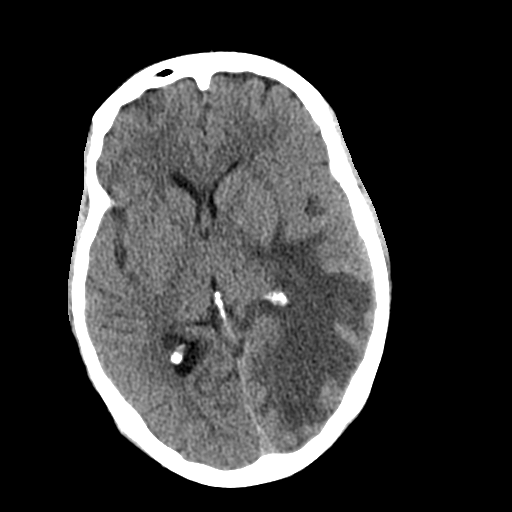
[im 14/31  bone]
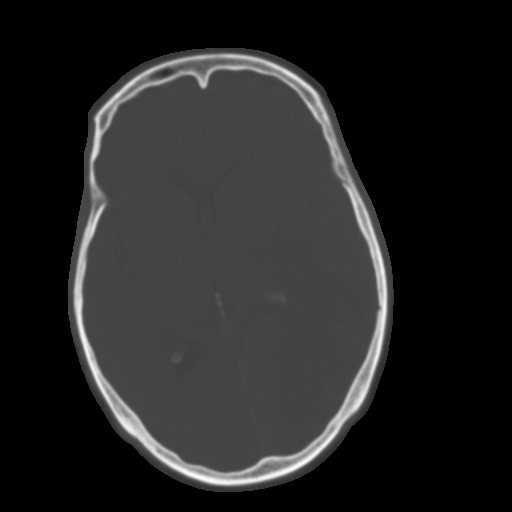
[im 17/31  brain]
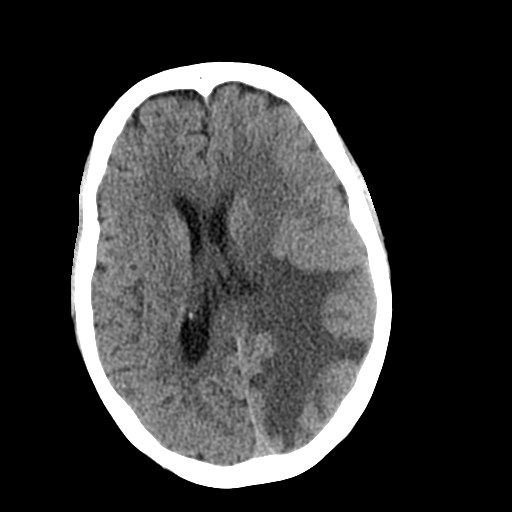
[im 20/31  brain]
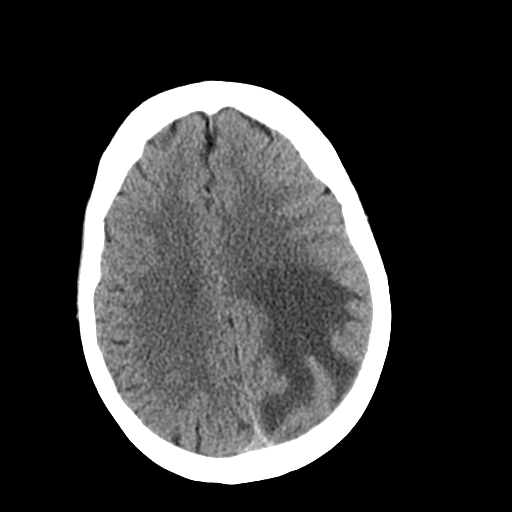
[im 23/31  brain]
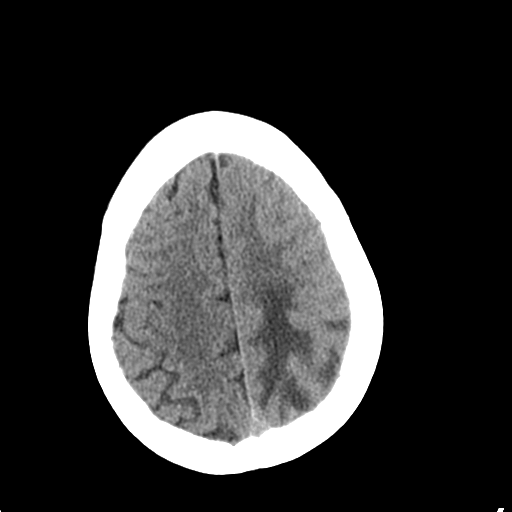
[im 25/31  brain]
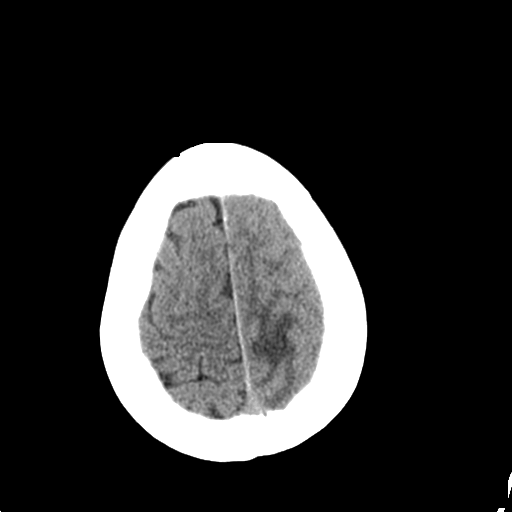
[im 25/31  bone]
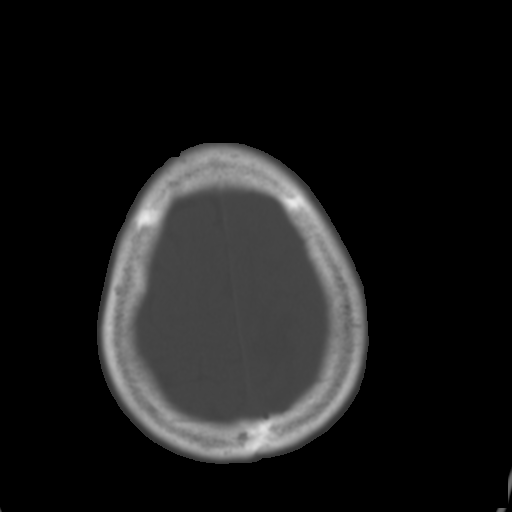
[im 28/31  brain]
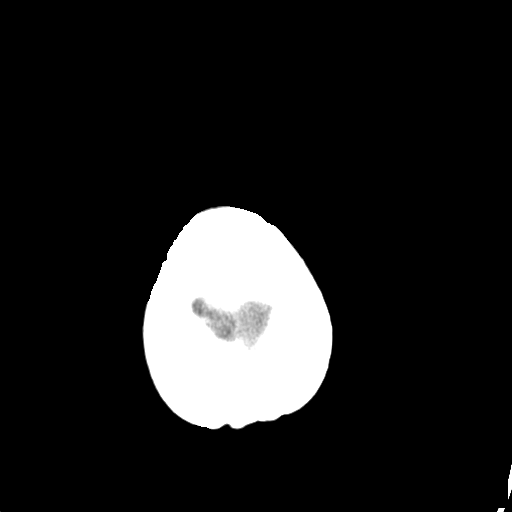

[Series 4: coronal soft · coronal · 0.31mm/px · 3 of 68 slices shown]
[im 23/68  brain]
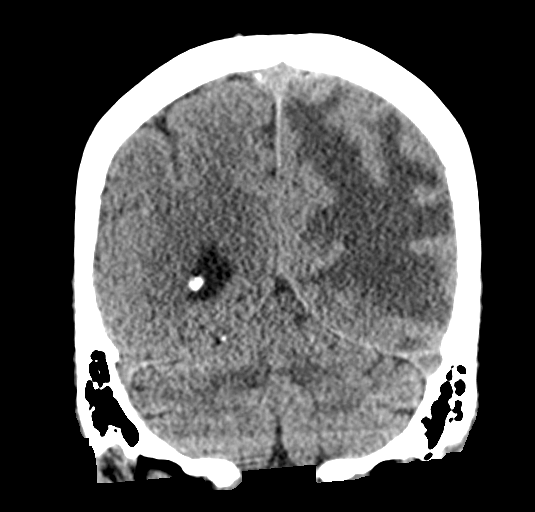
[im 30/68  brain]
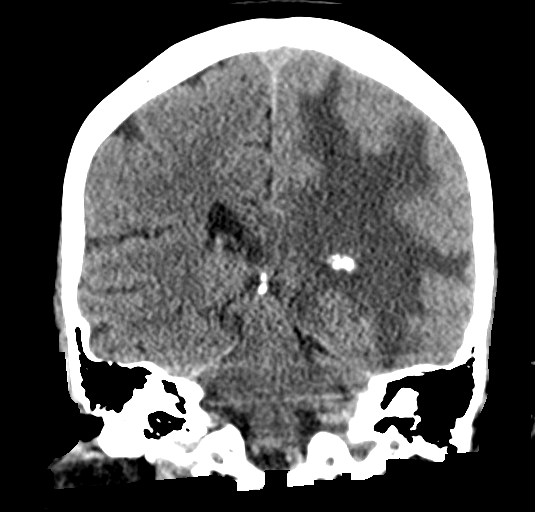
[im 38/68  brain]
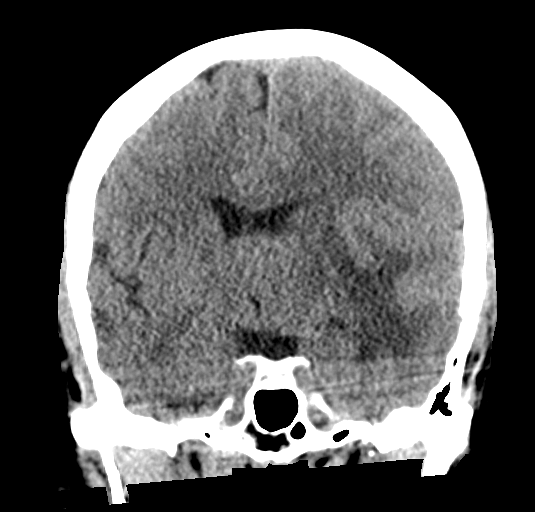

[Series 5: sagittal soft · sagittal · 0.31mm/px · 3 of 53 slices shown]
[im 18/53  brain]
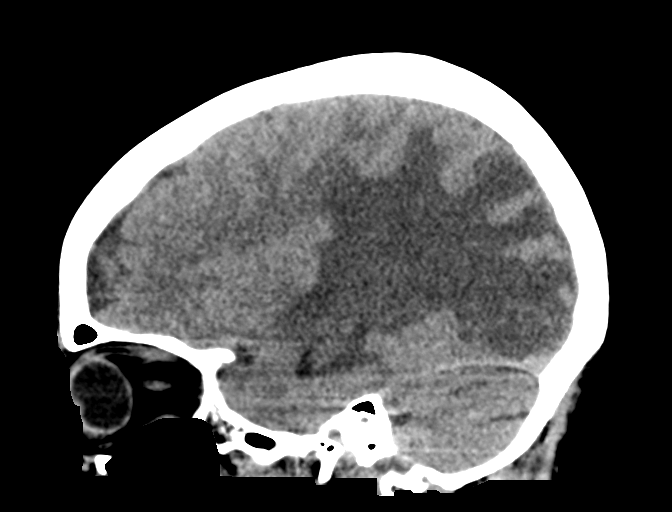
[im 27/53  brain]
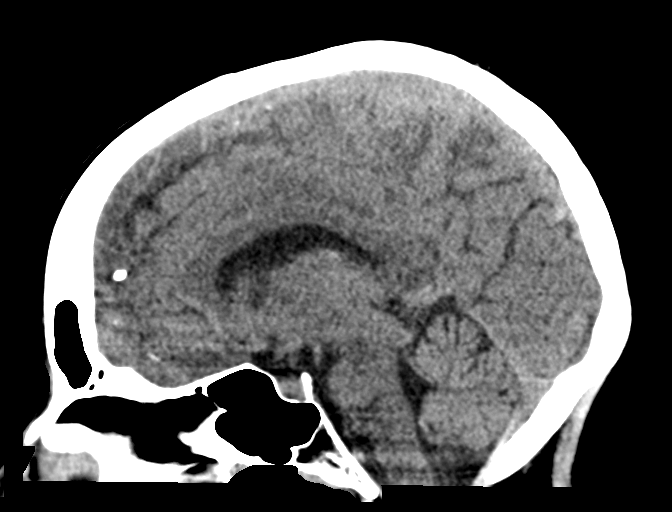
[im 35/53  brain]
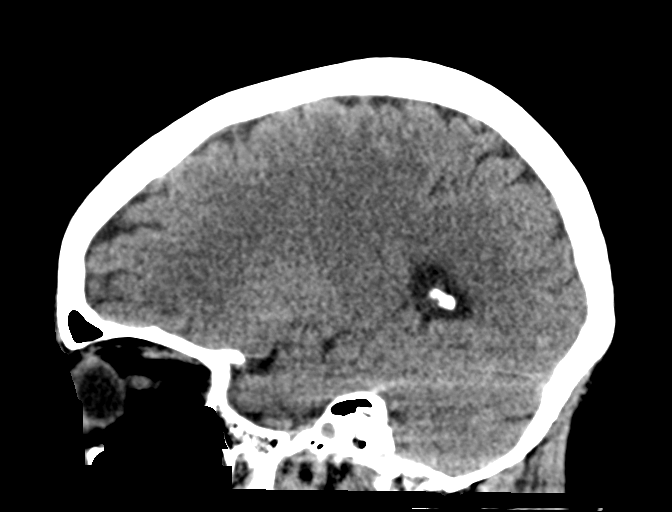

[16 of 47 positions shown; findings below may reference images not displayed]

FINDINGS: Brain: Extensive vasogenic type edema in the left cerebral
hemisphere with local mass effect. Suspect a lesion at the left
occipital lobe where there faint crescentic isodense edema crossing
the low-density white matter, likely the margin of the mass. No
evidence of superimposed infarction or hemorrhage. No hydrocephalus.

Vascular: No hyperdense vessel or unexpected calcification.

Skull: Normal. Negative for fracture or focal lesion.

Sinuses/Orbits: No acute finding.
IMPRESSION: Continued extensive vasogenic edema in the left cerebrum with
probable underlying left occipital lobe mass. Metastatic disease is
most likely given the recent chest CT findings.

## 2021-03-25 IMAGING — MR MR HEAD WO/W CM
15 series · 48 of 48 positions shown · IV contrast (GADAVIST)
Comparison: [DATE]

CLINICAL DATA: Primary bronchogenic carcinoma. Metastatic disease
evaluation.

EXAM:
MRI HEAD WITHOUT AND WITH CONTRAST
TECHNIQUE: Multiplanar, multiecho pulse sequences of the brain and surrounding
structures were obtained without and with intravenous contrast.
CONTRAST:  7mL GADAVIST GADOBUTROL 1 MMOL/ML IV SOLN

[Series 7: T1 · sagittal · 5.0mm · 0.75mm/px · 2 of 24 slices shown (1 of 2)]
[im 1/24]
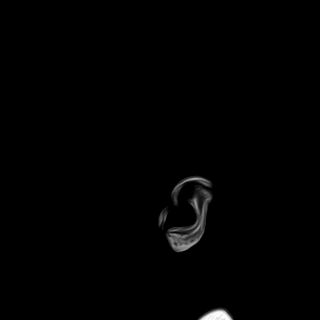
[im 24/24]
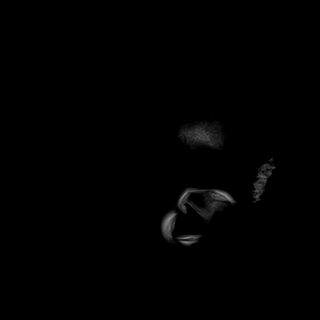

[Series 8: DWI · axial · 3.5mm · 2.09mm/px · z∈[-76,+74]mm · 4 of 88 slices shown (1 of 2)]
[im 1/88]
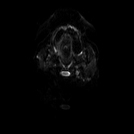
[im 30/88]
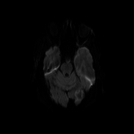
[im 59/88]
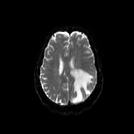
[im 88/88]
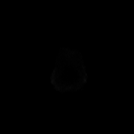

[Series 9: DWI · axial · 3.5mm · 2.09mm/px · z∈[-76,+74]mm · 2 of 44 slices shown (2 of 2)]
[im 1/44]
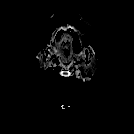
[im 44/44]
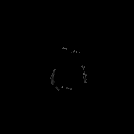

[Series 10: swi_images · axial · 3.0mm · 0.94mm/px · z∈[-80,+85]mm · 3 of 56 slices shown]
[im 1/56]
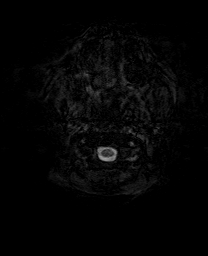
[im 28/56]
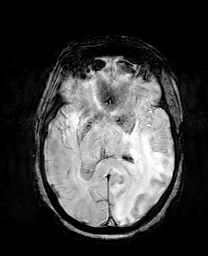
[im 56/56]
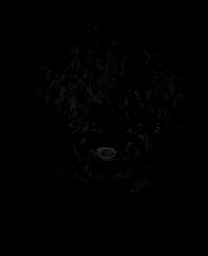

[Series 12: T2 · axial · 5.0mm · 0.75mm/px · 1 of 29 slices shown (1 of 2)]
[im 1/29]
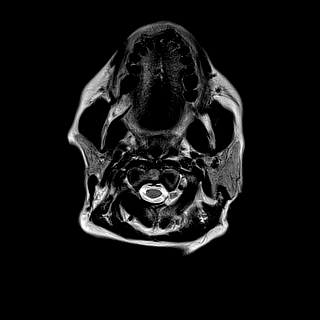

[Series 13: FLAIR · axial · 3.5mm · 0.81mm/px · z∈[-92,+79]mm · 2 of 50 slices shown (1 of 2)]
[im 1/50]
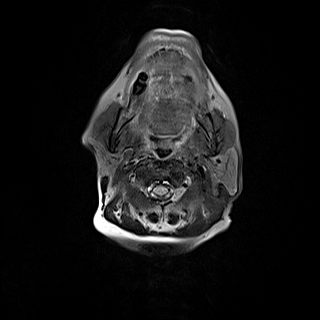
[im 50/50]
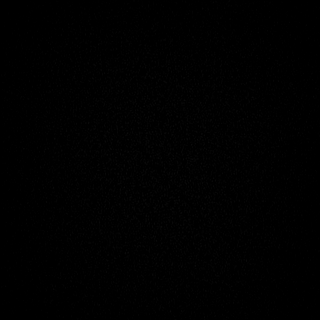

[Series 14: T1 · axial · 1.0mm · 1.02mm/px · z∈[-97,+78]mm · 8 of 176 slices shown (2 of 2)]
[im 1/176]
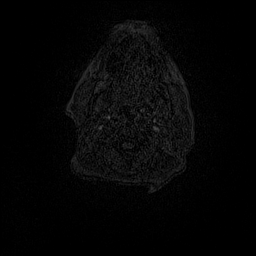
[im 26/176]
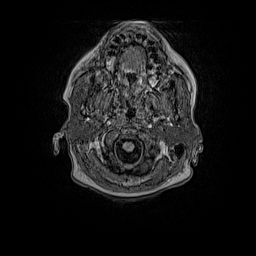
[im 51/176]
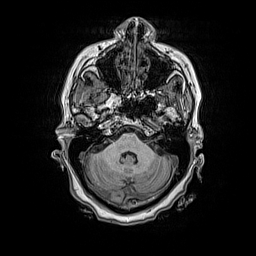
[im 76/176]
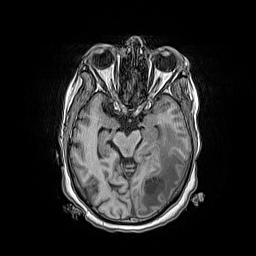
[im 101/176]
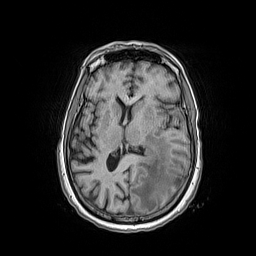
[im 126/176]
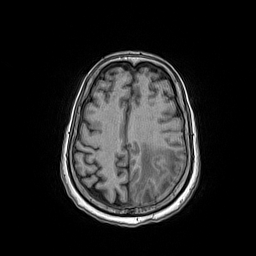
[im 151/176]
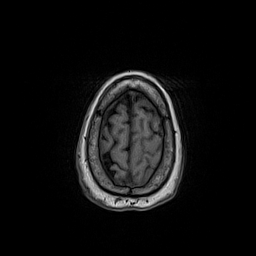
[im 176/176]
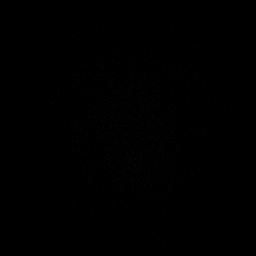

[Series 15: FLAIR · sagittal · 1.0mm · 0.51mm/px · 8 of 160 slices shown (2 of 2)]
[im 1/160]
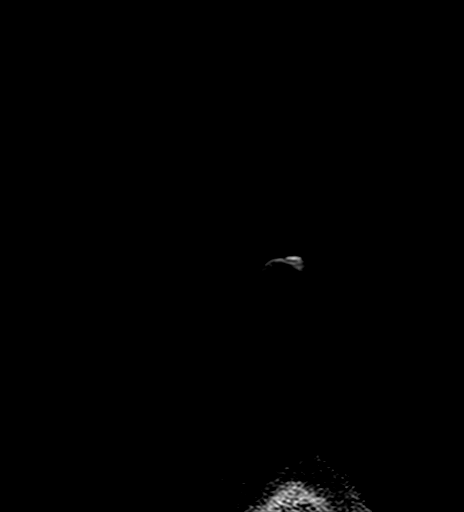
[im 23/160]
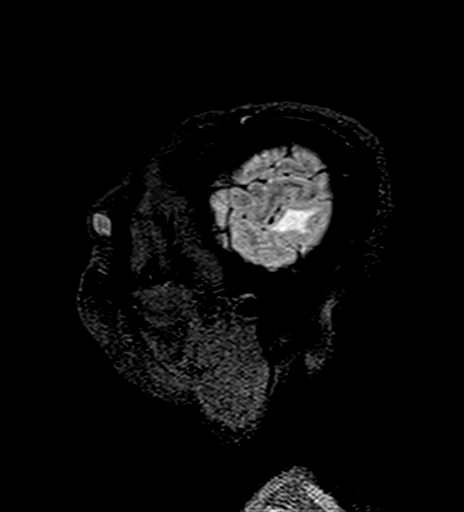
[im 46/160]
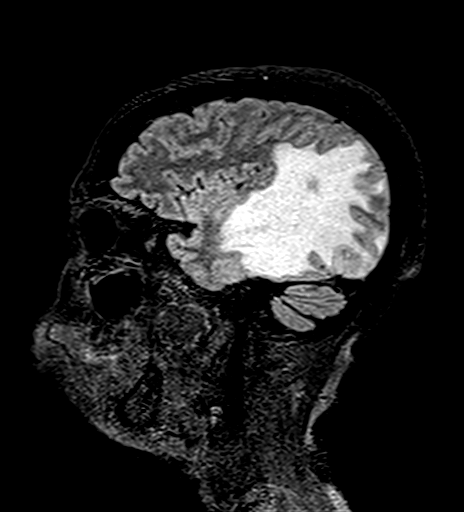
[im 69/160]
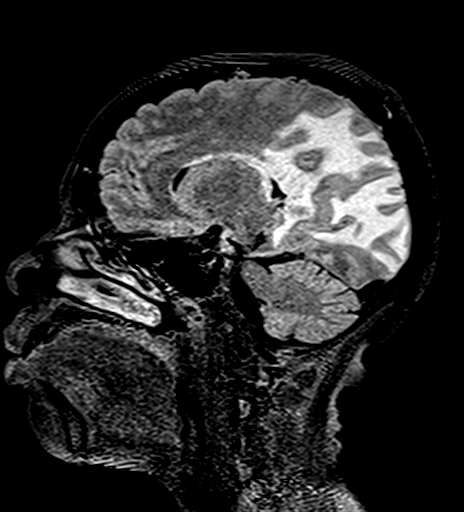
[im 91/160]
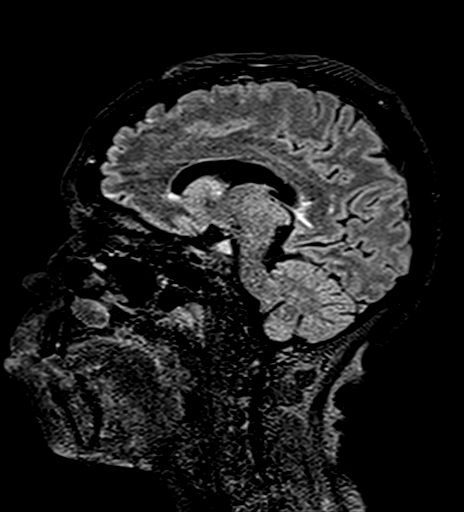
[im 114/160]
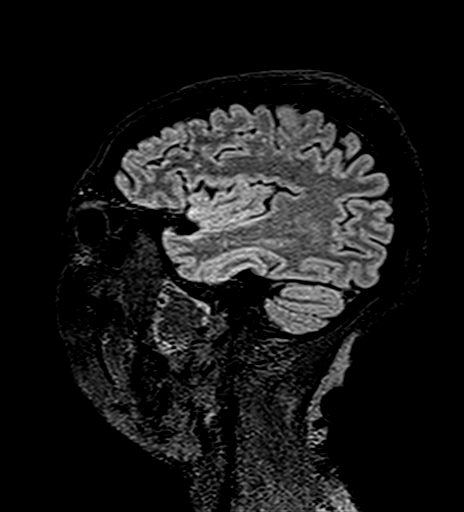
[im 137/160]
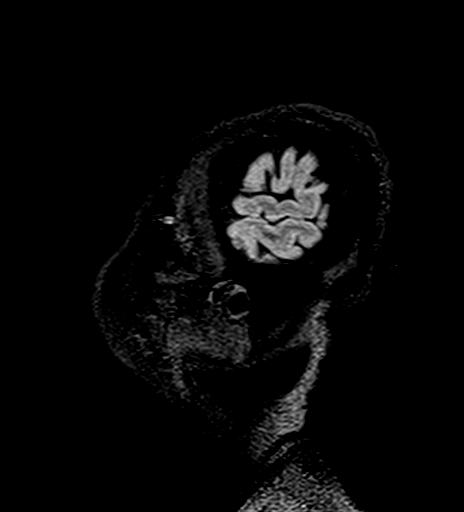
[im 160/160]
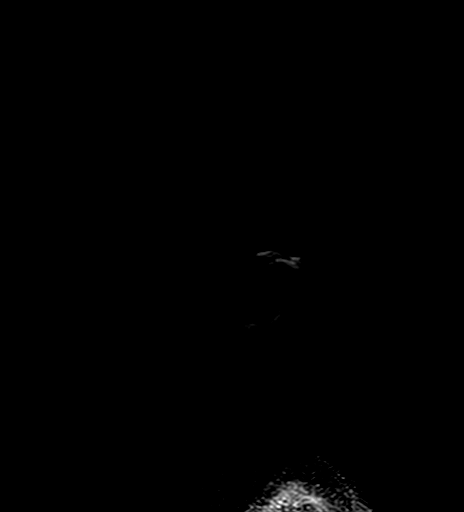

[Series 16: cor dwi_tracew · coronal · 5.0mm · 1.53mm/px · 3 of 56 slices shown]
[im 1/56]
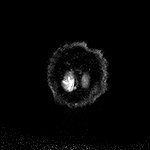
[im 28/56]
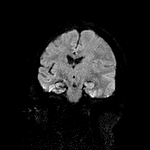
[im 56/56]
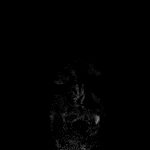

[Series 17: cor dwi_adc · coronal · 5.0mm · 1.53mm/px · 1 of 28 slices shown]
[im 1/28]
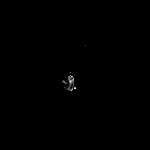

[Series 23: T1 post-contrast · axial · 1.0mm · 1.02mm/px · z∈[-75,+100]mm · 8 of 176 slices shown (1 of 3)]
[im 1/176]
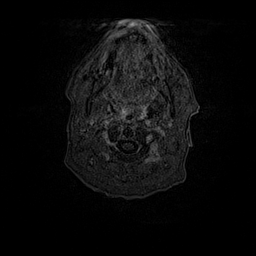
[im 26/176]
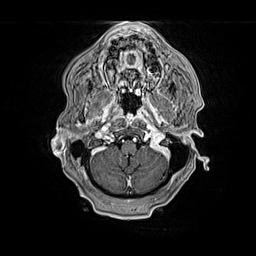
[im 51/176]
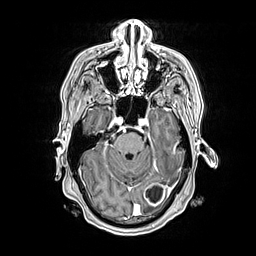
[im 76/176]
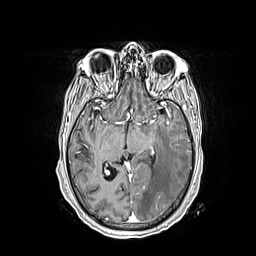
[im 101/176]
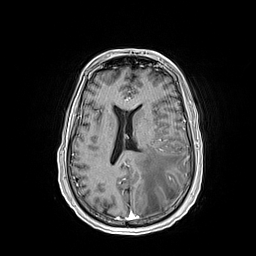
[im 126/176]
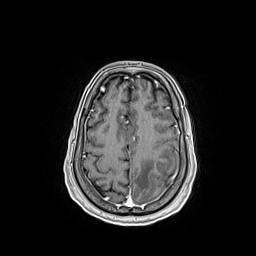
[im 151/176]
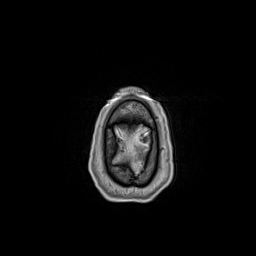
[im 176/176]
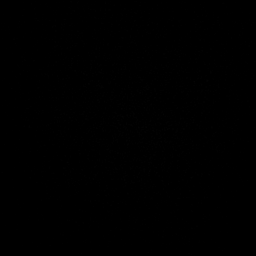

[Series 24: T1 post-contrast · coronal · 4.0mm · 0.57mm/px · 2 of 40 slices shown (2 of 3)]
[im 1/40]
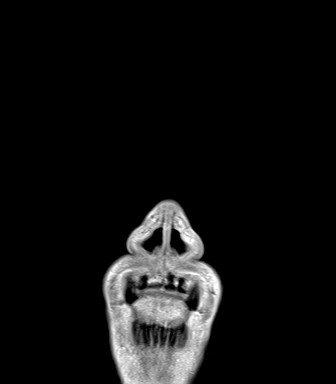
[im 40/40]
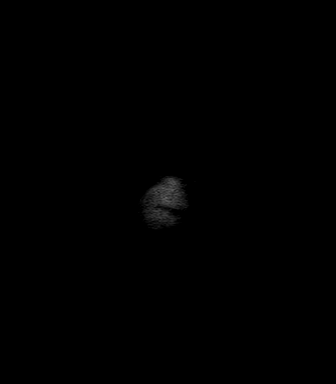

[Series 25: T1 post-contrast · sagittal · 5.0mm · 0.75mm/px · 1 of 24 slices shown (3 of 3)]
[im 1/24]
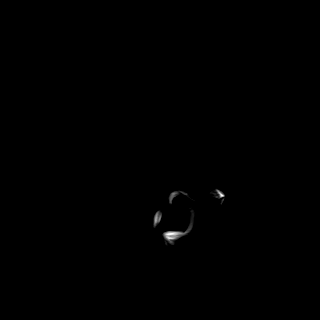

[Series 26: T2 post-contrast · coronal · 4.0mm · 0.69mm/px · 2 of 40 slices shown]
[im 1/40]
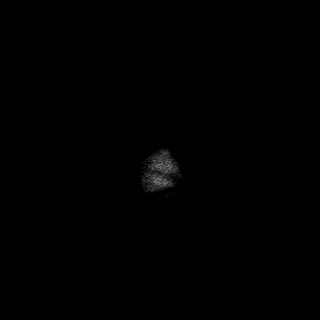
[im 40/40]
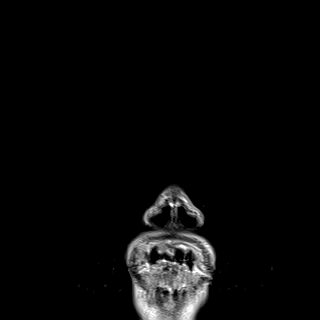

[Series 27: T2 · axial · 5.0mm · 0.75mm/px · 1 of 29 slices shown (2 of 2)]
[im 1/29]
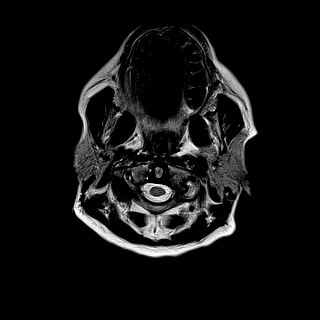

[48 of 48 positions shown; findings below may reference images not displayed]

FINDINGS: Brain: No acute infarct, mass effect or extra-axial collection. No
acute or chronic hemorrhage. Peripherally contrast-enhancing lesion
of the left occipital lobe measuring 2.2 x 2.0 x 2.6 cm. There is a
large amount of surrounding edema. There are no other
contrast-enhancing lesions. The midline structures are normal. There
is 8 mm of rightward midline shift.

Vascular: Major flow voids are preserved.

Skull and upper cervical spine: Normal calvarium and skull base.
Visualized upper cervical spine and soft tissues are normal.

Sinuses/Orbits:No paranasal sinus fluid levels or advanced mucosal
thickening. No mastoid or middle ear effusion. Normal orbits.
Tornwaldt cyst incidentally noted.
IMPRESSION: 1. Peripherally contrast-enhancing lesion of the left occipital lobe
with large amount of surrounding edema. This could be a solitary
metastasis or a primary brain neoplasm.
2. Rightward midline shift of 8 mm.

## 2021-03-25 MED ORDER — ACETAMINOPHEN 500 MG PO TABS
1000.0000 mg | ORAL_TABLET | Freq: Once | ORAL | Status: AC
  Administered 2021-03-25: 09:00:00 1000 mg via ORAL
  Filled 2021-03-25: qty 2

## 2021-03-25 MED ORDER — GADOBUTROL 1 MMOL/ML IV SOLN
7.0000 mL | Freq: Once | INTRAVENOUS | Status: AC | PRN
  Administered 2021-03-25: 21:00:00 7 mL via INTRAVENOUS

## 2021-03-25 MED ORDER — ACETAMINOPHEN 325 MG PO TABS: 650.0000 mg | ORAL_TABLET | Freq: Four times a day (QID) | ORAL | Status: DC | PRN

## 2021-03-25 MED ORDER — DEXAMETHASONE SODIUM PHOSPHATE 4 MG/ML IJ SOLN
4.0000 mg | Freq: Four times a day (QID) | INTRAMUSCULAR | Status: DC
  Administered 2021-03-25 – 2021-03-28 (×12): 4 mg via INTRAVENOUS
  Filled 2021-03-25 (×12): qty 1

## 2021-03-25 MED ORDER — ONDANSETRON HCL 4 MG PO TABS: 4.0000 mg | ORAL_TABLET | Freq: Four times a day (QID) | ORAL | Status: DC | PRN

## 2021-03-25 MED ORDER — ONDANSETRON HCL 4 MG/2ML IJ SOLN: 4.0000 mg | Freq: Four times a day (QID) | INTRAMUSCULAR | Status: DC | PRN

## 2021-03-25 MED ORDER — ENOXAPARIN SODIUM 40 MG/0.4ML IJ SOSY: 40.0000 mg | PREFILLED_SYRINGE | INTRAMUSCULAR | Status: DC

## 2021-03-25 MED ORDER — OXYCODONE HCL 5 MG PO TABS: 5.0000 mg | ORAL_TABLET | ORAL | Status: DC | PRN

## 2021-03-25 MED ORDER — DEXAMETHASONE SODIUM PHOSPHATE 10 MG/ML IJ SOLN
10.0000 mg | Freq: Once | INTRAMUSCULAR | Status: AC
  Administered 2021-03-25: 09:00:00 10 mg via INTRAVENOUS
  Filled 2021-03-25: qty 1

## 2021-03-25 NOTE — H&P (View-Only) (Signed)
NAME:  Sandra Horne, MRN:  [id number], DOB:  1955/09/24, LOS: 0 ADMISSION DATE:  03/25/2021, CONSULTATION DATE:  03/25/21 REFERRING MD:  Sabino Gasser CHIEF COMPLAINT:  Lung Mass   History of Present Illness:  Sandra Horne is a 65 y.o. female with no known significant PMH.  She had back ache Sept 2021 and was seen in ED.  CT at the time demonstrated left lung mass (bulky left hilar adenopathy with left infrahilar mass 3.1 x 2.7cm).  She was seen by Dr. Delton Coombes and had PET which confirmed hypermetabolic left hilar mass.  MRI brain at the time was negative.  She was unfortunately lost to follow up.  03/15/21, she returned to ED for headache.  When asked why she didn't follow up, she informed staff that she felt that "it would just go away".CT head demonstrated large area of left sided vasogenic edema with mass and 59mm MLS.  CT chest redemonstrated known mass, now 4.4 x 3.3cm.  Dr. Delton Coombes recommended steroids and outpatient follow up.  12/28, she returned to ED for headache and confusion.  Since prior visit, she had not followed up with oncology because she lost her phone and could not get in touch with anybody.  In ED, CT head again demonstrated left sided continued extensive vasogenic edema with probable underlying left occipital lobe mass.  She was subsequently transferred to [facility] for oncology input and further workup.  She is a long time smoker, roughly 1/2 to 1ppd for at least 25 - 30 years.  She apparently cut down to 2 - 4 cigarettes per day roughly 1 year ago.  She denies any recent weight loss.  She has had some confusion over the past few days (this along with headache is what prompted return ED visit).  No syncope, LOC, gait disturbance, seizures.  No cough, fevers/chills/sweats, hemoptysis.  Given her lung mass, PCCM asked to see in consultation for consideration biopsy.  At the time of my evaluation, it appears pt unfortunately has significant component of denial.  She also tells me  that she is interested in a biopsy to confirm things; however, she is adamant that she would not want chemotherapy or radiation as she had a bad experience with a family member in the past. I encouraged her to discuss her concerns with oncology and hear the options available.  Pertinent  Medical History:  has CLOSED FRACTURE OF UNSPECIFIED PART OF HUMERUS; Mass of left lung; and Acute encephalopathy on their problem list.  Significant Hospital Events: Including procedures, antibiotic start and stop dates in addition to other pertinent events   12/28 admit.  Interim History / Subjective:  Comfortable. Has some denial but coming to terms with things.  Interested in biopsy but adamant against chemo or XRT.  Objective:  Blood pressure 107/70, pulse 76, temperature 98.4 F (36.9 C), temperature source Oral, resp. rate 18, height 5\' 4"  (1.626 m), weight 62.5 kg, SpO2 97 %.       No intake or output data in the 24 hours ending 03/25/21 1513 Filed Weights   03/25/21 0615 03/25/21 1331  Weight: 65.8 kg 62.5 kg    Examination: General: Adult female, resting in bed, in NAD. Neuro: A&O x 3, no deficits. HEENT: [place]/AT. Sclerae anicteric. EOMI.  MMM. Cardiovascular: RRR, no M/R/G.  Lungs: Respirations even and unlabored.  CTA bilaterally, No W/R/R.  Abdomen: BS x 4, soft, NT/ND.  Musculoskeletal: No gross deformities, no edema.  Skin: Intact, warm, no rashes.  Labs/imaging personally reviewed:  CT chest 12/18 > left infrahilar mass. CT Head 12/28 > increased vasogenic edema with probable left occipital mass.  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  Brain MRI and PET were negative for metastasis at the time.  Unfortunately her head CT now shows probable brain mets with vasogenic edema.  Of note, pt is interested in biopsy to know / confirm cancer diagnosis; however, she is  adamant against chemo or radiation.  I encouraged her to discuss options with oncology. - Lung mass can be biopsied via EBUS.  Dr. Chase Caller to follow and decide on timing. - Oncology aware of pt admission. - After we have tissue sampling / confirmation, further discussions for treatment options per oncology. - Defer to oncology regarding repeat PET / further imaging. - Continue empiric Decadron.  Ongoing tobacco dependence. - Tobacco cessation counseling strongly encouraged.  Best practice (evaluated daily):  Per primary team.  Labs   CBC: Recent Labs  Lab 03/25/21 0709  WBC 12.3*  NEUTROABS 6.8  HGB 11.5*  HCT 35.2*  MCV 88.9  PLT 413*    Basic Metabolic Panel: Recent Labs  Lab 03/25/21 0709  NA 137  K 3.9  CL 106  CO2 26  GLUCOSE 127*  BUN 8  CREATININE 0.62  CALCIUM 8.4*   GFR: Estimated Creatinine Clearance: 61.3 mL/min (by C-G formula based on SCr of 0.62 mg/dL). Recent Labs  Lab 03/25/21 0709  WBC 12.3*    Liver Function Tests: Recent Labs  Lab 03/25/21 0709  AST 24  ALT 39  ALKPHOS 70  BILITOT 0.5  PROT 6.1*  ALBUMIN 2.9*   No results for input(s): LIPASE, AMYLASE in the last 168 hours. No results for input(s): AMMONIA in the last 168 hours.  ABG No results found for: PHART, PCO2ART, PO2ART, HCO3, TCO2, ACIDBASEDEF, O2SAT   Coagulation Profile: Recent Labs  Lab 03/25/21 0709  INR 1.0    Cardiac Enzymes: No results for input(s): CKTOTAL, CKMB, CKMBINDEX, TROPONINI in the last 168 hours.  HbA1C: No results found for: HGBA1C  CBG: No results for input(s): GLUCAP in the last 168 hours.  Review of Systems:   All negative; except for those that are bolded, which indicate positives.  Constitutional: weight loss, weight gain, night sweats, fevers, chills, fatigue, weakness.  HEENT: headaches, sore throat, sneezing, nasal congestion, post nasal drip, difficulty swallowing, tooth/dental problems, visual complaints, visual changes, ear  aches. Neuro: difficulty with speech, weakness, numbness, ataxia, confusion. CV:  chest pain, orthopnea, PND, swelling in lower extremities, dizziness, palpitations, syncope.  Resp: cough, hemoptysis, dyspnea, wheezing. GI: heartburn, indigestion, abdominal pain, nausea, vomiting, diarrhea, constipation, change in bowel habits, loss of appetite, hematemesis, melena, hematochezia.  GU: dysuria, change in color of urine, urgency or frequency, flank pain, hematuria. MSK: joint pain or swelling, decreased range of motion. Psych: change in mood or affect, depression, anxiety, suicidal ideations, homicidal ideations. Skin: rash, itching, bruising.   Past Medical History:  She,  has no past medical history on file.   Surgical History:   Past Surgical History:  Procedure Laterality Date   ECTOPIC PREGNANCY SURGERY       Social History:   reports that she has been smoking cigarettes. She has a 10.00 pack-year smoking history. She has never used smokeless tobacco. She reports current alcohol use. She reports that she does not currently use drugs after having used the following  drugs: Marijuana.   Family History:  Her family history includes Cancer in her paternal uncle and sister; Hypertension in her father and mother; Stroke in her mother.   Allergies No Known Allergies   Home Medications  Prior to Admission medications   Medication Sig Start Date End Date Taking? Authorizing Provider  dexamethasone (DECADRON) 4 MG tablet Take 1 tablet (4 mg total) by mouth 2 (two) times daily with a meal. 03/15/21 04/14/21 Yes Marcello Fennel, PA-C  polyethylene glycol (MIRALAX) 17 g packet Take 17 g by mouth daily. Patient not taking: Reported on 03/25/2021 03/05/21   Jeanell Sparrow, DO     [place], [place] For pager details, please see AMION or use Epic chat  After [phone], please call [facility] for cross coverage needs 03/25/2021, 3:13 PM

## 2021-03-25 NOTE — H&P (Signed)
History and Physical    Sandra Horne WIO:[id number] DOB: [birth year] DOA: 03/25/2021  PCP: Pcp, No   Patient coming from: Home  Chief Complaint: Headache, weakness, confusion  HPI: Sandra Horne is a 65 y.o. female with medical history significant for headache and large left sided brain mass along with hilar mass.  She does not see a PCP and claims to have no other medical problems and states that she was diagnosed with a hilar mass approximately 1 year ago, but was lost to follow-up.  She was seen on 12/18 with headaches and vasogenic edema related to the mass and was prescribed dexamethasone and discharged with plans to follow-up with Dr. Delton Coombes, but unfortunately she lost her phone and was unable to make the follow-up appointment.  She has been taking Decadron over the last few days which initially helped with the headache, but this is now worsening once again.  Husband at bedside states that she is now having some worsening confusion as well as difficulty walking and difficulty with balance.  She denies any fevers, chills, nausea, vomiting, diarrhea.  She does claim to have some mild diplopia, but no speech deficits.   ED Course: Vital signs stable and patient is afebrile.  Mild leukocytosis of 12,300 noted and hemoglobin 11.5.  She has been given Tylenol as well as IV Decadron for vasogenic edema that has been redemonstrated on CT head.  She denies any significant pain at this time and is otherwise hungry.  EDP has discussed case with oncology, Dr. Delton Coombes who recommends transfer to [facility] for further evaluation as well as consultation with radiation oncology.  Continue on Decadron as prescribed.  Review of Systems: Reviewed as noted above, otherwise negative.  History reviewed. No pertinent past medical history.  Past Surgical History:  Procedure Laterality Date   ECTOPIC PREGNANCY SURGERY       reports that she has been smoking cigarettes. She has a 10.00 pack-year  smoking history. She has never used smokeless tobacco. She reports current alcohol use. She reports that she does not currently use drugs after having used the following drugs: Marijuana.  No Known Allergies  Family History  Problem Relation Age of Onset   Hypertension Mother    Stroke Mother    Hypertension Father    Cancer Sister    Cancer Paternal Uncle     Prior to Admission medications   Medication Sig Start Date End Date Taking? Authorizing Provider  dexamethasone (DECADRON) 4 MG tablet Take 1 tablet (4 mg total) by mouth 2 (two) times daily with a meal. 03/15/21 04/14/21 Yes Marcello Fennel, PA-C  polyethylene glycol (MIRALAX) 17 g packet Take 17 g by mouth daily. Patient not taking: Reported on 03/25/2021 03/05/21   Jeanell Sparrow, DO    Physical Exam: Vitals:   03/25/21 1100 03/25/21 1142 03/25/21 1145 03/25/21 1146  BP:  101/71    Pulse: 79  76   Resp:      Temp:    98.4 F (36.9 C)  TempSrc:    Oral  SpO2: 97%  97%   Weight:      Height:        Constitutional: NAD, calm, comfortable Vitals:   03/25/21 1100 03/25/21 1142 03/25/21 1145 03/25/21 1146  BP:  101/71    Pulse: 79  76   Resp:      Temp:    98.4 F (36.9 C)  TempSrc:    Oral  SpO2: 97%  97%  Weight:      Height:       Eyes: lids and conjunctivae normal Neck: normal, supple Respiratory: clear to auscultation bilaterally. Normal respiratory effort. No accessory muscle use.  Cardiovascular: Regular rate and rhythm, no murmurs. Abdomen: no tenderness, no distention. Bowel sounds positive.  Musculoskeletal:  No edema. Skin: no rashes, lesions, ulcers.  Psychiatric: Flat affect  Labs on Admission: I have personally reviewed following labs and imaging studies  CBC: Recent Labs  Lab 03/25/21 0709  WBC 12.3*  NEUTROABS 6.8  HGB 11.5*  HCT 35.2*  MCV 88.9  PLT 024*   Basic Metabolic Panel: Recent Labs  Lab 03/25/21 0709  NA 137  K 3.9  CL 106  CO2 26  GLUCOSE 127*  BUN 8   CREATININE 0.62  CALCIUM 8.4*   GFR: Estimated Creatinine Clearance: 66.5 mL/min (by C-G formula based on SCr of 0.62 mg/dL). Liver Function Tests: Recent Labs  Lab 03/25/21 0709  AST 24  ALT 39  ALKPHOS 70  BILITOT 0.5  PROT 6.1*  ALBUMIN 2.9*   No results for input(s): LIPASE, AMYLASE in the last 168 hours. No results for input(s): AMMONIA in the last 168 hours. Coagulation Profile: Recent Labs  Lab 03/25/21 0709  INR 1.0   Cardiac Enzymes: No results for input(s): CKTOTAL, CKMB, CKMBINDEX, TROPONINI in the last 168 hours. BNP (last 3 results) No results for input(s): PROBNP in the last 8760 hours. HbA1C: No results for input(s): HGBA1C in the last 72 hours. CBG: No results for input(s): GLUCAP in the last 168 hours. Lipid Profile: No results for input(s): CHOL, HDL, LDLCALC, TRIG, CHOLHDL, LDLDIRECT in the last 72 hours. Thyroid Function Tests: No results for input(s): TSH, T4TOTAL, FREET4, T3FREE, THYROIDAB in the last 72 hours. Anemia Panel: No results for input(s): VITAMINB12, FOLATE, FERRITIN, TIBC, IRON, RETICCTPCT in the last 72 hours. Urine analysis:    Component Value Date/Time   COLORURINE YELLOW 03/15/2021 1633   APPEARANCEUR HAZY (A) 03/15/2021 1633   LABSPEC 1.013 03/15/2021 1633   PHURINE 6.0 03/15/2021 1633   GLUCOSEU NEGATIVE 03/15/2021 1633   HGBUR SMALL (A) 03/15/2021 1633   BILIRUBINUR NEGATIVE 03/15/2021 1633   KETONESUR 5 (A) 03/15/2021 1633   PROTEINUR NEGATIVE 03/15/2021 1633   NITRITE NEGATIVE 03/15/2021 1633   LEUKOCYTESUR NEGATIVE 03/15/2021 1633    Radiological Exams on Admission: CT Head Wo Contrast  Result Date: 03/25/2021 CLINICAL DATA:  Left-sided headache for 3 days EXAM: CT HEAD WITHOUT CONTRAST TECHNIQUE: Contiguous axial images were obtained from the base of the skull through the vertex without intravenous contrast. COMPARISON:  Ten days ago FINDINGS: Brain: Extensive vasogenic type edema in the left cerebral hemisphere  with local mass effect. Suspect a lesion at the left occipital lobe where there faint crescentic isodense edema crossing the low-density white matter, likely the margin of the mass. No evidence of superimposed infarction or hemorrhage. No hydrocephalus. Vascular: No hyperdense vessel or unexpected calcification. Skull: Normal. Negative for fracture or focal lesion. Sinuses/Orbits: No acute finding. IMPRESSION: Continued extensive vasogenic edema in the left cerebrum with probable underlying left occipital lobe mass. Metastatic disease is most likely given the recent chest CT findings. Electronically Signed   By: Jorje Guild M.D.   On: 03/25/2021 06:53     Assessment/Plan Principal Problem:   Acute encephalopathy    Acute encephalopathy/headache -In the setting of large left sided brain mass with extensive vasogenic edema -This is likely related to metastatic lung cancer which has not been previously  evaluated -Continue IV Decadron and transfer to [facility] for further evaluation per oncology and radiation oncology  Hilar mass -Likely suggestive of primary lung cancer -Further evaluation per oncology appreciated  Anemia -Check anemia panel  Mild leukocytosis -Continue to monitor, but likely related to recent steroid use  Mild hyperglycemia -Check A1c -Close monitoring during admission  Ongoing tobacco abuse -Counseled on cessation  DVT prophylaxis: Lovenox Code Status: Full Family Communication: Husband at bedside Disposition Plan:Admit for Oncology/Rad onc evaluation Consults called:Rad Onc, Oncology Admission status: Inpatient   Aldonia Keeven D Calirose Mccance DO [facility]  If 7PM-7AM, please contact night-coverage [email or web address]  03/25/2021, 11:49 AM

## 2021-03-25 NOTE — ED Provider Notes (Signed)
MSE was initiated and I personally evaluated the patient and placed orders (if any) at  6:30 AM on March 25, 2021.  The patient appears stable so that the remainder of the MSE may be completed by another provider.    Haji Delaine, Corene Cornea, MD 03/25/21 0630

## 2021-03-25 NOTE — ED Triage Notes (Signed)
Pt c/o headache 4-5 days.

## 2021-03-25 NOTE — Consult Note (Signed)
Reason for the request:    Advanced malignancy.  HPI: I was asked by Dr. Manuella Ghazi to evaluate Sandra Horne for evaluation of hilar mass and brain lesion.  She is a 65 year old woman who does not seek routine medical care who was found to have abnormal CT scan in September 2021.  At that time she was found to have bulky left hilar adenopathy and left infrahilar mass measuring 3.1 x 2.7 cm.  She was evaluated by Dr. Delton Coombes and recommended complete staging as well as MRI of the brain although does not appear to that she had a biopsy.  The PET scan showed left hilar mass consistent with malignancy without any evidence of metastatic disease at that time.  She appears to have lost to follow-up and had a repeat CT scan on 03/15/2021 showed enlarging left hilar mass measuring 4.4 x 3.3 cm consistent with her known primary lung neoplasm.  No additional metastatic lesion noted.  CT scan of the head obtained on 03/25/2021 showed extensive vasogenic edema on the left cerebrum probable underlying left occipital lobe mass likely consistent with metastatic disease.  Based on these findings, she was transferred from a [facility] to [place] to complete her work-up and evaluation.  She was started on IV Decadron in the interim.  Clinically, she appears comfortable without any distress.  She denies any headaches or blurry vision.  She denies any shortness of breath or difficulty breathing.   She does not report any syncope or seizures. Does not report any fevers, chills or sweats.  Does not report any cough, wheezing or hemoptysis.  Does not report any chest pain, palpitation, orthopnea or leg edema.  Does not report any nausea, vomiting or abdominal pain.  Does not report any constipation or diarrhea.  Does not report any skeletal complaints.    Does not report frequency, urgency or hematuria.  Does not report any skin rashes or lesions. Does not report any heat or cold intolerance.  Does not report any  lymphadenopathy or petechiae.  Does not report any anxiety or depression.  Remaining review of systems is negative.    History reviewed. No pertinent past medical history.:   Past Surgical History:  Procedure Laterality Date   ECTOPIC PREGNANCY SURGERY    :   Current Facility-Administered Medications:    acetaminophen (TYLENOL) tablet 650 mg, 650 mg, Oral, Q6H PRN, Manuella Ghazi, Pratik D, DO   dexamethasone (DECADRON) injection 4 mg, 4 mg, Intravenous, Q6H, Shah, Pratik D, DO, 4 mg at 03/25/21 1330   enoxaparin (LOVENOX) injection 40 mg, 40 mg, Subcutaneous, Q24H, Shah, Pratik D, DO   ondansetron (ZOFRAN) tablet 4 mg, 4 mg, Oral, Q6H PRN **OR** ondansetron (ZOFRAN) injection 4 mg, 4 mg, Intravenous, Q6H PRN, Manuella Ghazi, Pratik D, DO   oxyCODONE (Oxy IR/ROXICODONE) immediate release tablet 5 mg, 5 mg, Oral, Q4H PRN, Manuella Ghazi, Pratik D, DO:  No Known Allergies:   Family History  Problem Relation Age of Onset   Hypertension Mother    Stroke Mother    Hypertension Father    Cancer Sister    Cancer Paternal Uncle   :   Social History   Socioeconomic History   Marital status: Widowed    Spouse name: Not on file   Number of children: Not on file   Years of education: Not on file   Highest education level: Not on file  Occupational History   Not on file  Tobacco Use   Smoking status: Some Days  Packs/day: 0.25    Years: 40.00    Pack years: 10.00    Types: Cigarettes   Smokeless tobacco: Never  Vaping Use   Vaping Use: Never used  Substance and Sexual Activity   Alcohol use: Yes    Comment: drinks beer rarely    Drug use: Not Currently    Types: Marijuana    Comment: last marijuana use mid 11/19/2019   Sexual activity: Yes    Birth control/protection: None  Other Topics Concern   Not on file  Social History Narrative   Not on file   Social Determinants of Health   Financial Resource Strain: Not on file  Food Insecurity: Not on file  Transportation Needs: Not on file   Physical Activity: Not on file  Stress: Not on file  Social Connections: Not on file  Intimate Partner Violence: Not on file  :  Pertinent items are noted in HPI.  Exam: Blood pressure 107/70, pulse 76, temperature 98.4 F (36.9 C), temperature source Oral, resp. rate 18, height 5\' 4"  (1.626 m), weight 137 lb 12.6 oz (62.5 kg), SpO2 97 %. ECOG 1 General appearance: alert and cooperative appeared without distress. Head: atraumatic without any abnormalities. Eyes: conjunctivae/corneas clear. PERRL.  Sclera anicteric. Throat: lips, mucosa, and tongue normal; without oral thrush or ulcers. Resp: clear to auscultation bilaterally without rhonchi, wheezes or dullness to percussion. Cardio: regular rate and rhythm, S1, S2 normal, no murmur, click, rub or gallop GI: soft, non-tender; bowel sounds normal; no masses,  no organomegaly Skin: Skin color, texture, turgor normal. No rashes or lesions Lymph nodes: Cervical, supraclavicular, and axillary nodes normal. Neurologic: Grossly normal without any motor, sensory or deep tendon reflexes. Musculoskeletal: No joint deformity or effusion.   Recent Labs    03/25/21 0709  WBC 12.3*  HGB 11.5*  HCT 35.2*  PLT 413*    Recent Labs    03/25/21 0709  NA 137  K 3.9  CL 106  CO2 26  GLUCOSE 127*  BUN 8  CREATININE 0.62  CALCIUM 8.4*      DG Abdomen 1 View  Result Date: 03/05/2021 CLINICAL DATA:  Abdominal pain, constipation. EXAM: ABDOMEN - 1 VIEW COMPARISON:  None. FINDINGS: The bowel gas pattern is normal. No definite nephrolithiasis is noted. Phleboliths are noted in the pelvis. IMPRESSION: No abnormal bowel dilatation is noted. Electronically Signed   By: Marijo Conception M.D.   On: 03/05/2021 08:59   CT Head Wo Contrast  Result Date: 03/25/2021 CLINICAL DATA:  Left-sided headache for 3 days EXAM: CT HEAD WITHOUT CONTRAST TECHNIQUE: Contiguous axial images were obtained from the base of the skull through the vertex without  intravenous contrast. COMPARISON:  Ten days ago FINDINGS: Brain: Extensive vasogenic type edema in the left cerebral hemisphere with local mass effect. Suspect a lesion at the left occipital lobe where there faint crescentic isodense edema crossing the low-density white matter, likely the margin of the mass. No evidence of superimposed infarction or hemorrhage. No hydrocephalus. Vascular: No hyperdense vessel or unexpected calcification. Skull: Normal. Negative for fracture or focal lesion. Sinuses/Orbits: No acute finding. IMPRESSION: Continued extensive vasogenic edema in the left cerebrum with probable underlying left occipital lobe mass. Metastatic disease is most likely given the recent chest CT findings. Electronically Signed   By: Jorje Guild M.D.   On: 03/25/2021 06:53   CT Head Wo Contrast  Result Date: 03/15/2021 CLINICAL DATA:  Headache. COVID 2 weeks ago. History of lung cancer. EXAM: CT HEAD  WITHOUT CONTRAST TECHNIQUE: Contiguous axial images were obtained from the base of the skull through the vertex without intravenous contrast. COMPARISON:  CT head 04/23/2012.  MRI brain 01/17/2020. FINDINGS: Brain: There is white matter hypodensity throughout the left frontal parietal and temporal lobes with preservation of gray-white matter distinction concerning for vasogenic edema. There is associated sulcal effacement and mass effect with 6 mm of midline shift to the right. There is mild compression of the left lateral ventricle. The ventricles are normal in size. There is no acute intracranial hemorrhage or extra-axial fluid collection. Vascular: No hyperdense vessel or unexpected calcification. Skull: Normal. Negative for fracture or focal lesion. Sinuses/Orbits: No acute finding. Other: None. IMPRESSION: 1. Large area of hypodensity in the left frontal, parietal and temporal lobe worrisome for vasogenic edema. There is mass effect with 6 mm of midline shift to the right. Recommend further evaluation  with MRI with and without contrast to evaluate for metastatic disease or other underlying brain lesion. 2. No acute intracranial hemorrhage. Electronically Signed   By: Ronney Asters M.D.   On: 03/15/2021 19:42   CT Chest W Contrast  Result Date: 03/15/2021 CLINICAL DATA:  History of recent COVID diagnosis 2 weeks ago with increased weakness EXAM: CT CHEST WITH CONTRAST TECHNIQUE: Multidetector CT imaging of the chest was performed during intravenous contrast administration. CONTRAST:  78mL OMNIPAQUE IOHEXOL 300 MG/ML  SOLN COMPARISON:  Chest x-ray from earlier in the same day, CT from 12/13/2019. FINDINGS: Cardiovascular: Thoracic aorta shows no aneurysmal dilatation or dissection. Pulmonary artery as visualized is within normal limits. Mild coronary calcifications are noted. No cardiac enlargement is seen. Mediastinum/Nodes: The thyroid gland demonstrates mild heterogeneity with a 17 mm bilobed partially calcified nodule within the right lobe of the thyroid. The overall appearance has decreased in the interval from a prior exam from 2021. Scattered small mediastinal lymph nodes are noted which appears stable when compared with the prior exam. The esophagus as visualized is within normal limits. Prominent left hilar lymph node is noted along the anterior aspect of the left pulmonary artery measuring 12 mm but previously shown to be hypermetabolic on prior PET-CT. Lungs/Pleura: Lungs are well aerated bilaterally. There is again a mass lesion identified in the left hilum/infrahilar region which measures 4.4 x 3.3 cm in greatest dimension. This is similar in appearance to that seen on prior PET-CT from 2021 and again suspicious for primary pulmonary neoplasm. Some mild patchy reticulonodular changes are noted in the left lower lobe likely related to some postobstructive change. These changes are new from the prior PET-CT. No other focal nodule is seen. Upper Abdomen: Mild heterogeneity of the liver is noted  without focal mass. Gallbladder is within normal limits. The remainder of the upper abdomen is unremarkable. Musculoskeletal: Degenerative changes of the thoracic spine are seen. No acute rib abnormality is noted. No compression deformities are noted. IMPRESSION: Left hilar/infrahilar mass lesion measuring 4.4 x 3.3 cm. 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 Associated hilar and mediastinal lymph nodes are noted. Correlate with the clinical history as to any prior workup following previous PET-CT in 2021. Some new postobstructive inflammatory changes noted in the left lower lobe when compared with the prior PET-CT. Focal 17 mm nodule in the right lobe of the thyroid. This is decreased in size when compared with prior CT of the chest from 2021. Recommend nonemergent thyroid US (ref: J Am Coll Radiol. 2015 Feb;12(2): 143-50). Heterogeneity of the liver without focal mass. Electronically Signed  By: Inez Catalina M.D.   On: 03/15/2021 19:54   DG Chest Port 1 View  Result Date: 03/15/2021 CLINICAL DATA:  Cough and weakness. EXAM: PORTABLE CHEST 1 VIEW COMPARISON:  Chest x-ray 04/23/2012. FINDINGS: There are minimal strandy opacities in the left lung base favored is atelectasis. The lungs are otherwise clear. There is no pleural effusion or pneumothorax. The cardiomediastinal silhouette is within normal limits. No acute fractures. IMPRESSION: 1. Left basilar atelectasis. Electronically Signed   By: Ronney Asters M.D.   On: 03/15/2021 16:45    Assessment and Plan:   65 year old woman with:  1.  Left lung neoplasm noted with a left hilar mass in September 2021 that has progressed in the last year without any definitive treatment given.  She has not had any tissue biopsy either at this time.  The presentation is highly suspicious for advanced lung neoplasm.  Small cell lung cancer could be a possibility but given the chronicity and the slow progression might make it less likely.  Obtaining  tissue biopsy as a first step at this time.  Pulmonary medicine has evaluated the patient and biopsy hopefully is planned in the near future. She will require repeat PET imaging but this can be done as an outpatient at this time.  Once tissue biopsy is obtained, systemic treatment will be tailored accordingly once her treatment for her CNS disease is completed.  2.  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  Radiation oncology is involved and likely will proceed with radiation treatment to the brain before any systemic therapy has commenced.  3.  Disposition and follow-up: She will follow-up with [facility] upon discharge further management and treatment of her lung malignancy.  60  minutes were dedicated to this visit.  50% of the time was face-to-face with the patient and family.  The time was spent on reviewing laboratory data, imaging studies, discussing treatment options, discussing differential diagnosis and answering questions regarding future plan.

## 2021-03-25 NOTE — ED Provider Notes (Signed)
[facility] EMERGENCY DEPARTMENT Provider Note  CSN: [id number] Arrival date & time: 03/25/21 4401    History Chief Complaint  Patient presents with   Headache    Sandra Horne is a 65 y.o. female presents for re-evaluation of headache. She was seen in the ED on 12/18 for same and found to have an area of vasogenic edema, likely from underlying mass. Chest CT that day also revealed a hilar mass concerning for malignancy. She was referred to Dr. Delton Coombes but she lost her phone and was unable to make an appointment. She has been taking decadron which helped with the headache initially but it has gotten worse in the last few days. Husband states she has had periods of confusion and difficulty walking (walks slow, difficulty with balance). She has not had a fever, cough, N/V/D.    History reviewed. No pertinent past medical history.  Past Surgical History:  Procedure Laterality Date   ECTOPIC PREGNANCY SURGERY      Family History  Problem Relation Age of Onset   Hypertension Mother    Stroke Mother    Hypertension Father    Cancer Sister    Cancer Paternal Uncle     Social History   Tobacco Use   Smoking status: Some Days    Packs/day: 0.25    Years: 40.00    Pack years: 10.00    Types: Cigarettes   Smokeless tobacco: Never  Vaping Use   Vaping Use: Never used  Substance Use Topics   Alcohol use: Yes    Comment: drinks beer rarely    Drug use: Not Currently    Types: Marijuana    Comment: last marijuana use mid 11/19/2019     Home Medications Prior to Admission medications   Medication Sig Start Date End Date Taking? Authorizing Provider  dexamethasone (DECADRON) 4 MG tablet Take 1 tablet (4 mg total) by mouth 2 (two) times daily with a meal. 03/15/21 04/14/21 Yes Marcello Fennel, PA-C  polyethylene glycol (MIRALAX) 17 g packet Take 17 g by mouth daily. Patient not taking: Reported on 03/25/2021 03/05/21   Wynona Dove A, DO     Allergies    Patient  has no known allergies.   Review of Systems   Review of Systems A comprehensive review of systems was completed and negative except as noted in HPI.    Physical Exam BP 136/81    Pulse 76    Temp 98 F (36.7 C) (Oral)    Resp 14    Ht 5\' 4"  (1.626 m)    Wt 62.5 kg    SpO2 100%    BMI 23.65 kg/m   Physical Exam Vitals and nursing note reviewed.  Constitutional:      Appearance: Normal appearance.  HENT:     Head: Normocephalic and atraumatic.     Nose: Nose normal.     Mouth/Throat:     Mouth: Mucous membranes are moist.  Eyes:     Extraocular Movements: Extraocular movements intact.     Conjunctiva/sclera: Conjunctivae normal.  Cardiovascular:     Rate and Rhythm: Normal rate.  Pulmonary:     Effort: Pulmonary effort is normal.     Breath sounds: Normal breath sounds.  Abdominal:     General: Abdomen is flat.     Palpations: Abdomen is soft.     Tenderness: There is no abdominal tenderness.  Musculoskeletal:        General: No swelling. Normal range of motion.  Cervical back: Neck supple.  Skin:    General: Skin is warm and dry.  Neurological:     General: No focal deficit present.     Mental Status: She is alert.     Cranial Nerves: No cranial nerve deficit.     Sensory: No sensory deficit.     Motor: No weakness.  Psychiatric:        Mood and Affect: Mood normal.     ED Results / Procedures / Treatments   Labs (all labs ordered are listed, but only abnormal results are displayed) Labs Reviewed  CBC WITH DIFFERENTIAL/PLATELET - Abnormal; Notable for the following components:      Result Value   WBC 12.3 (*)    Hemoglobin 11.5 (*)    HCT 35.2 (*)    Platelets 413 (*)    Monocytes Absolute 1.9 (*)    Abs Immature Granulocytes 0.23 (*)    All other components within normal limits  COMPREHENSIVE METABOLIC PANEL - Abnormal; Notable for the following components:   Glucose, Bld 127 (*)    Calcium 8.4 (*)    Total Protein 6.1 (*)    Albumin 2.9 (*)     All other components within normal limits  IRON AND TIBC - Abnormal; Notable for the following components:   Iron 19 (*)    TIBC 246 (*)    Saturation Ratios 8 (*)    All other components within normal limits  HEMOGLOBIN A1C - Abnormal; Notable for the following components:   Hgb A1c MFr Bld 6.5 (*)    All other components within normal limits  RESP PANEL BY RT-PCR (FLU A&B, COVID) ARPGX2  PROTIME-INR  VITAMIN B12  FOLATE  FERRITIN  RETICULOCYTES  HIV ANTIBODY (ROUTINE TESTING W REFLEX)  URINALYSIS, ROUTINE W REFLEX MICROSCOPIC  MAGNESIUM  BASIC METABOLIC PANEL  CBC    EKG None  Radiology CT Head Wo Contrast  Result Date: 03/25/2021 CLINICAL DATA:  Left-sided headache for 3 days EXAM: CT HEAD WITHOUT CONTRAST TECHNIQUE: Contiguous axial images were obtained from the base of the skull through the vertex without intravenous contrast. COMPARISON:  Ten days ago FINDINGS: Brain: Extensive vasogenic type edema in the left cerebral hemisphere with local mass effect. Suspect a lesion at the left occipital lobe where there faint crescentic isodense edema crossing the low-density white matter, likely the margin of the mass. No evidence of superimposed infarction or hemorrhage. No hydrocephalus. Vascular: No hyperdense vessel or unexpected calcification. Skull: Normal. Negative for fracture or focal lesion. Sinuses/Orbits: No acute finding. IMPRESSION: Continued extensive vasogenic edema in the left cerebrum with probable underlying left occipital lobe mass. Metastatic disease is most likely given the recent chest CT findings. Electronically Signed   By: Jorje Guild M.D.   On: 03/25/2021 06:53   MR BRAIN W WO CONTRAST  Result Date: 03/25/2021 CLINICAL DATA:  Primary bronchogenic carcinoma. Metastatic disease evaluation. EXAM: MRI HEAD WITHOUT AND WITH CONTRAST TECHNIQUE: Multiplanar, multiecho pulse sequences of the brain and surrounding structures were obtained without and with  intravenous contrast. CONTRAST:  45mL GADAVIST GADOBUTROL 1 MMOL/ML IV SOLN COMPARISON:  10/17/2018 FINDINGS: Brain: No acute infarct, mass effect or extra-axial collection. No acute or chronic hemorrhage. Peripherally contrast-enhancing lesion of the left occipital lobe measuring 2.2 x 2.0 x 2.6 cm. There is a large amount of surrounding edema. There are no other contrast-enhancing lesions. The midline structures are normal. There is 8 mm of rightward midline shift. Vascular: Major flow voids are preserved. Skull and  upper cervical spine: Normal calvarium and skull base. Visualized upper cervical spine and soft tissues are normal. Sinuses/Orbits:No paranasal sinus fluid levels or advanced mucosal thickening. No mastoid or middle ear effusion. Normal orbits. Tornwaldt cyst incidentally noted. IMPRESSION: 1. Peripherally contrast-enhancing lesion of the left occipital lobe with large amount of surrounding edema. This could be a solitary metastasis or a primary brain neoplasm. 2. Rightward midline shift of 8 mm. Electronically Signed   By: Ulyses Jarred M.D.   On: 03/25/2021 21:45    Procedures Procedures  Medications Ordered in the ED Medications  dexamethasone (DECADRON) injection 4 mg (4 mg Intravenous Given 03/26/21 0529)  acetaminophen (TYLENOL) tablet 650 mg (has no administration in time range)  oxyCODONE (Oxy IR/ROXICODONE) immediate release tablet 5 mg (has no administration in time range)  ondansetron (ZOFRAN) tablet 4 mg (has no administration in time range)    Or  ondansetron (ZOFRAN) injection 4 mg (has no administration in time range)  acetaminophen (TYLENOL) tablet 1,000 mg (1,000 mg Oral Given 03/25/21 0900)  dexamethasone (DECADRON) injection 10 mg (10 mg Intravenous Given 03/25/21 0901)  gadobutrol (GADAVIST) 1 MMOL/ML injection 7 mL (7 mLs Intravenous Contrast Given 03/25/21 2051)     MDM Rules/Calculators/A&P MDM Patient with known lung cancer, appears to have been lost to  follow up for over a year, now with likely brain mets, headaches and difficulty walking. Her CT today is similar to last week. Will check basic labs, give IV Decadron and plan discussion again with Dr. Delton Coombes regarding best options for her.   ED Course  I have reviewed the triage vital signs and the nursing notes.  Pertinent labs & imaging results that were available during my care of the patient were reviewed by me and considered in my medical decision making (see chart for details).  Clinical Course as of 03/26/21 0654  Wed Mar 25, 2021  1443 CT images and results reviewed, vasogenic edema, similar to previous.  [CS]  1540 CBC with mild leukocytosis, likely from decadron use.  [CS]  0867 INR is normal.  [CS]  6195 CMP is unremarkable.  [CS]  [phone] Spoke with Dr. Delton Coombes, Oncology, who requests that the patient be admitted, preferably at [facility] where she can be evaluated by Rad Oncology but he also recommends discussion with Neurosurgery to ensure they do not anticipate any intervention that would require her to be at [facility].  [CS]  [phone] Spoke with Dr. Arnoldo Morale, Neurosurgery, who has reviewed CT images. States there is no emergent surgery need but if the admitting team would like a Neurosurgery consult she will need to be admitted to [facility]. Otherwise [facility] is fine. Hospitalist paged. Patient aware of plan.  [CS]  [phone] Spoke with Dr. Manuella Ghazi, Hospitalist, who will evaluate for admission.  [CS]    Clinical Course User Index [CS] Truddie Hidden, MD    Final Clinical Impression(s) / ED Diagnoses Final diagnoses:  Lung mass  Brain metastasis [facility])    Rx / DC Orders ED Discharge Orders     None        Truddie Hidden, MD 03/26/21 [phone]

## 2021-03-25 NOTE — Consult Note (Signed)
NAME:  Sandra Horne, MRN:  [id number], DOB:  10/04/1955, LOS: 0 ADMISSION DATE:  03/25/2021, CONSULTATION DATE:  03/25/21 REFERRING MD:  Sabino Gasser CHIEF COMPLAINT:  Lung Mass   History of Present Illness:  Sandra Horne is a 65 y.o. female with no known significant PMH.  She had back ache Sept 2021 and was seen in ED.  CT at the time demonstrated left lung mass (bulky left hilar adenopathy with left infrahilar mass 3.1 x 2.7cm).  She was seen by Dr. Delton Coombes and had PET which confirmed hypermetabolic left hilar mass.  MRI brain at the time was negative.  She was unfortunately lost to follow up.  03/15/21, she returned to ED for headache.  When asked why she didn't follow up, she informed staff that she felt that "it would just go away".CT head demonstrated large area of left sided vasogenic edema with mass and 93mm MLS.  CT chest redemonstrated known mass, now 4.4 x 3.3cm.  Dr. Delton Coombes recommended steroids and outpatient follow up.  12/28, she returned to ED for headache and confusion.  Since prior visit, she had not followed up with oncology because she lost her phone and could not get in touch with anybody.  In ED, CT head again demonstrated left sided continued extensive vasogenic edema with probable underlying left occipital lobe mass.  She was subsequently transferred to [facility] for oncology input and further workup.  She is a long time smoker, roughly 1/2 to 1ppd for at least 25 - 30 years.  She apparently cut down to 2 - 4 cigarettes per day roughly 1 year ago.  She denies any recent weight loss.  She has had some confusion over the past few days (this along with headache is what prompted return ED visit).  No syncope, LOC, gait disturbance, seizures.  No cough, fevers/chills/sweats, hemoptysis.  Given her lung mass, PCCM asked to see in consultation for consideration biopsy.  At the time of my evaluation, it appears pt unfortunately has significant component of denial.  She also tells me  that she is interested in a biopsy to confirm things; however, she is adamant that she would not want chemotherapy or radiation as she had a bad experience with a family member in the past. I encouraged her to discuss her concerns with oncology and hear the options available.  Pertinent  Medical History:  has CLOSED FRACTURE OF UNSPECIFIED PART OF HUMERUS; Mass of left lung; and Acute encephalopathy on their problem list.  Significant Hospital Events: Including procedures, antibiotic start and stop dates in addition to other pertinent events   12/28 admit.  Interim History / Subjective:  Comfortable. Has some denial but coming to terms with things.  Interested in biopsy but adamant against chemo or XRT.  Objective:  Blood pressure 107/70, pulse 76, temperature 98.4 F (36.9 C), temperature source Oral, resp. rate 18, height 5\' 4"  (1.626 m), weight 62.5 kg, SpO2 97 %.       No intake or output data in the 24 hours ending 03/25/21 1513 Filed Weights   03/25/21 0615 03/25/21 1331  Weight: 65.8 kg 62.5 kg    Examination: General: Adult female, resting in bed, in NAD. Neuro: A&O x 3, no deficits. HEENT: [place]/AT. Sclerae anicteric. EOMI.  MMM. Cardiovascular: RRR, no M/R/G.  Lungs: Respirations even and unlabored.  CTA bilaterally, No W/R/R.  Abdomen: BS x 4, soft, NT/ND.  Musculoskeletal: No gross deformities, no edema.  Skin: Intact, warm, no rashes.  Labs/imaging personally reviewed:  CT chest 12/18 > left infrahilar mass. CT Head 12/28 > increased vasogenic edema with probable left occipital mass.  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  Brain MRI and PET were negative for metastasis at the time.  Unfortunately her head CT now shows probable brain mets with vasogenic edema.  Of note, pt is interested in biopsy to know / confirm cancer diagnosis; however, she is  adamant against chemo or radiation.  I encouraged her to discuss options with oncology. - Lung mass can be biopsied via EBUS.  Dr. Chase Caller to follow and decide on timing. - Oncology aware of pt admission. - After we have tissue sampling / confirmation, further discussions for treatment options per oncology. - Defer to oncology regarding repeat PET / further imaging. - Continue empiric Decadron.  Ongoing tobacco dependence. - Tobacco cessation counseling strongly encouraged.  Best practice (evaluated daily):  Per primary team.  Labs   CBC: Recent Labs  Lab 03/25/21 0709  WBC 12.3*  NEUTROABS 6.8  HGB 11.5*  HCT 35.2*  MCV 88.9  PLT 413*    Basic Metabolic Panel: Recent Labs  Lab 03/25/21 0709  NA 137  K 3.9  CL 106  CO2 26  GLUCOSE 127*  BUN 8  CREATININE 0.62  CALCIUM 8.4*   GFR: Estimated Creatinine Clearance: 61.3 mL/min (by C-G formula based on SCr of 0.62 mg/dL). Recent Labs  Lab 03/25/21 0709  WBC 12.3*    Liver Function Tests: Recent Labs  Lab 03/25/21 0709  AST 24  ALT 39  ALKPHOS 70  BILITOT 0.5  PROT 6.1*  ALBUMIN 2.9*   No results for input(s): LIPASE, AMYLASE in the last 168 hours. No results for input(s): AMMONIA in the last 168 hours.  ABG No results found for: PHART, PCO2ART, PO2ART, HCO3, TCO2, ACIDBASEDEF, O2SAT   Coagulation Profile: Recent Labs  Lab 03/25/21 0709  INR 1.0    Cardiac Enzymes: No results for input(s): CKTOTAL, CKMB, CKMBINDEX, TROPONINI in the last 168 hours.  HbA1C: No results found for: HGBA1C  CBG: No results for input(s): GLUCAP in the last 168 hours.  Review of Systems:   All negative; except for those that are bolded, which indicate positives.  Constitutional: weight loss, weight gain, night sweats, fevers, chills, fatigue, weakness.  HEENT: headaches, sore throat, sneezing, nasal congestion, post nasal drip, difficulty swallowing, tooth/dental problems, visual complaints, visual changes, ear  aches. Neuro: difficulty with speech, weakness, numbness, ataxia, confusion. CV:  chest pain, orthopnea, PND, swelling in lower extremities, dizziness, palpitations, syncope.  Resp: cough, hemoptysis, dyspnea, wheezing. GI: heartburn, indigestion, abdominal pain, nausea, vomiting, diarrhea, constipation, change in bowel habits, loss of appetite, hematemesis, melena, hematochezia.  GU: dysuria, change in color of urine, urgency or frequency, flank pain, hematuria. MSK: joint pain or swelling, decreased range of motion. Psych: change in mood or affect, depression, anxiety, suicidal ideations, homicidal ideations. Skin: rash, itching, bruising.   Past Medical History:  She,  has no past medical history on file.   Surgical History:   Past Surgical History:  Procedure Laterality Date   ECTOPIC PREGNANCY SURGERY       Social History:   reports that she has been smoking cigarettes. She has a 10.00 pack-year smoking history. She has never used smokeless tobacco. She reports current alcohol use. She reports that she does not currently use drugs after having used the following  drugs: Marijuana.   Family History:  Her family history includes Cancer in her paternal uncle and sister; Hypertension in her father and mother; Stroke in her mother.   Allergies No Known Allergies   Home Medications  Prior to Admission medications   Medication Sig Start Date End Date Taking? Authorizing Provider  dexamethasone (DECADRON) 4 MG tablet Take 1 tablet (4 mg total) by mouth 2 (two) times daily with a meal. 03/15/21 04/14/21 Yes Marcello Fennel, PA-C  polyethylene glycol (MIRALAX) 17 g packet Take 17 g by mouth daily. Patient not taking: Reported on 03/25/2021 03/05/21   Jeanell Sparrow, DO     Montey Hora, [place] For pager details, please see AMION or use Epic chat  After [phone], please call [facility] for cross coverage needs 03/25/2021, 3:13 PM

## 2021-03-26 ENCOUNTER — Encounter (HOSPITAL_COMMUNITY): Payer: Self-pay | Admitting: Internal Medicine

## 2021-03-26 ENCOUNTER — Ambulatory Visit
Admit: 2021-03-26 | Discharge: 2021-03-26 | Disposition: A | Discharge status: Home / Self Care | Payer: Medicaid Other | Source: Ambulatory Visit | Attending: Radiation Oncology | Admitting: Radiation Oncology

## 2021-03-26 ENCOUNTER — Inpatient Hospital Stay (HOSPITAL_COMMUNITY): Payer: Medicaid Other | Admitting: Anesthesiology

## 2021-03-26 ENCOUNTER — Encounter (HOSPITAL_COMMUNITY)
Admission: EM | Disposition: A | Discharge status: Home / Self Care | Payer: Self-pay | Source: Home / Self Care | Attending: Internal Medicine

## 2021-03-26 DIAGNOSIS — C7931 Secondary malignant neoplasm of brain: Secondary | ICD-10-CM | POA: Insufficient documentation

## 2021-03-26 DIAGNOSIS — R918 Other nonspecific abnormal finding of lung field: Secondary | ICD-10-CM

## 2021-03-26 DIAGNOSIS — Z7189 Other specified counseling: Secondary | ICD-10-CM

## 2021-03-26 DIAGNOSIS — Z515 Encounter for palliative care: Secondary | ICD-10-CM

## 2021-03-26 HISTORY — PX: BRONCHIAL NEEDLE ASPIRATION BIOPSY: SHX5106

## 2021-03-26 HISTORY — PX: HEMOSTASIS CONTROL: SHX6838

## 2021-03-26 HISTORY — PX: BRONCHIAL WASHINGS: SHX5105

## 2021-03-26 HISTORY — PX: BRONCHIAL BRUSHINGS: SHX5108

## 2021-03-26 HISTORY — PX: ENDOBRONCHIAL ULTRASOUND: SHX5096

## 2021-03-26 LAB — CBC
HCT: 35.3 % — ABNORMAL LOW (ref 36.0–46.0)
Hemoglobin: 11.8 g/dL — ABNORMAL LOW (ref 12.0–15.0)
MCH: 29.1 pg (ref 26.0–34.0)
MCHC: 33.4 g/dL (ref 30.0–36.0)
MCV: 86.9 fL (ref 80.0–100.0)
Platelets: 425 10*3/uL — ABNORMAL HIGH (ref 150–400)
RBC: 4.06 MIL/uL (ref 3.87–5.11)
RDW: 14.3 % (ref 11.5–15.5)
WBC: 19.2 10*3/uL — ABNORMAL HIGH (ref 4.0–10.5)
nRBC: 0 % (ref 0.0–0.2)

## 2021-03-26 LAB — BASIC METABOLIC PANEL
Anion gap: 7 (ref 5–15)
BUN: 11 mg/dL (ref 8–23)
CO2: 26 mmol/L (ref 22–32)
Calcium: 8.6 mg/dL — ABNORMAL LOW (ref 8.9–10.3)
Chloride: 106 mmol/L (ref 98–111)
Creatinine, Ser: 0.64 mg/dL (ref 0.44–1.00)
GFR, Estimated: >60 mL/min (ref >60)
Glucose, Bld: 163 mg/dL — ABNORMAL HIGH (ref 70–99)
Potassium: 4.1 mmol/L (ref 3.5–5.1)
Sodium: 139 mmol/L (ref 135–145)

## 2021-03-26 LAB — MAGNESIUM: Magnesium: 2.3 mg/dL (ref 1.7–2.4)

## 2021-03-26 SURGERY — ENDOBRONCHIAL ULTRASOUND (EBUS)
Anesthesia: General | Laterality: Bilateral

## 2021-03-26 MED ORDER — PROPOFOL 10 MG/ML IV BOLUS
INTRAVENOUS | Status: AC
  Filled 2021-03-26: qty 20

## 2021-03-26 MED ORDER — FENTANYL CITRATE (PF) 100 MCG/2ML IJ SOLN
INTRAMUSCULAR | Status: AC
  Filled 2021-03-26: qty 2

## 2021-03-26 MED ORDER — FENTANYL CITRATE (PF) 100 MCG/2ML IJ SOLN
INTRAMUSCULAR | Status: DC | PRN
  Administered 2021-03-26: 50 ug via INTRAVENOUS
  Administered 2021-03-26: 100 ug via INTRAVENOUS
  Administered 2021-03-26: 50 ug via INTRAVENOUS

## 2021-03-26 MED ORDER — LIDOCAINE HCL (CARDIAC) PF 100 MG/5ML IV SOSY
PREFILLED_SYRINGE | INTRAVENOUS | Status: DC | PRN
  Administered 2021-03-26: 80 mg via INTRAVENOUS

## 2021-03-26 MED ORDER — EPINEPHRINE 1 MG/10ML IJ SOSY
PREFILLED_SYRINGE | INTRAMUSCULAR | Status: DC | PRN
  Administered 2021-03-26: 0.1 mg via INTRAVENOUS

## 2021-03-26 MED ORDER — SODIUM CHLORIDE 0.9 % IV SOLN
250.0000 mg | Freq: Every day | INTRAVENOUS | Status: AC
  Administered 2021-03-26 – 2021-03-27 (×2): 250 mg via INTRAVENOUS
  Filled 2021-03-26 (×2): qty 20

## 2021-03-26 MED ORDER — ROCURONIUM BROMIDE 100 MG/10ML IV SOLN
INTRAVENOUS | Status: DC | PRN
  Administered 2021-03-26: 10 mg via INTRAVENOUS
  Administered 2021-03-26: 50 mg via INTRAVENOUS

## 2021-03-26 MED ORDER — FERROUS SULFATE 325 (65 FE) MG PO TABS
325.0000 mg | ORAL_TABLET | Freq: Every day | ORAL | Status: DC
  Administered 2021-03-27 – 2021-03-28 (×2): 325 mg via ORAL
  Filled 2021-03-26 (×2): qty 1

## 2021-03-26 MED ORDER — LACTATED RINGERS IV SOLN
INTRAVENOUS | Status: DC | PRN
Start: 2021-03-26 — End: 2021-03-26

## 2021-03-26 MED ORDER — SUGAMMADEX SODIUM 200 MG/2ML IV SOLN
INTRAVENOUS | Status: DC | PRN
Start: 2021-03-26 — End: 2021-03-26
  Administered 2021-03-26: 200 mg via INTRAVENOUS

## 2021-03-26 MED ORDER — PROPOFOL 10 MG/ML IV BOLUS
INTRAVENOUS | Status: DC | PRN
  Administered 2021-03-26: 150 mg via INTRAVENOUS

## 2021-03-26 MED ORDER — POLYETHYLENE GLYCOL 3350 17 G PO PACK
17.0000 g | PACK | Freq: Every day | ORAL | Status: DC
  Administered 2021-03-26 – 2021-03-28 (×3): 17 g via ORAL
  Filled 2021-03-26 (×3): qty 1

## 2021-03-26 MED ORDER — ONDANSETRON HCL 4 MG/2ML IJ SOLN
INTRAMUSCULAR | Status: DC | PRN
  Administered 2021-03-26: 4 mg via INTRAVENOUS

## 2021-03-26 MED ORDER — PHENYLEPHRINE 40 MCG/ML (10ML) SYRINGE FOR IV PUSH (FOR BLOOD PRESSURE SUPPORT)
PREFILLED_SYRINGE | INTRAVENOUS | Status: DC | PRN
  Administered 2021-03-26: 80 ug via INTRAVENOUS
  Administered 2021-03-26: 120 ug via INTRAVENOUS

## 2021-03-26 NOTE — Anesthesia Procedure Notes (Signed)
Procedure Name: Intubation Date/Time: 03/26/2021 1:49 PM Performed by: [place] Yoshiye Kraft C, CRNA Pre-anesthesia Checklist: Patient identified, Emergency Drugs available, Suction available, Patient being monitored and Timeout performed Patient Re-evaluated:Patient Re-evaluated prior to induction Oxygen Delivery Method: Circle system utilized Preoxygenation: Pre-oxygenation with 100% oxygen Induction Type: IV induction Ventilation: Mask ventilation without difficulty Laryngoscope Size: Mac and 3 Grade View: Grade I Tube type: Oral Tube size: 8.5 mm Number of attempts: 1 Airway Equipment and Method: Stylet Placement Confirmation: ETT inserted through vocal cords under direct vision, positive ETCO2 and breath sounds checked- equal and bilateral Secured at: 22 cm Tube secured with: Tape Dental Injury: Teeth and Oropharynx as per pre-operative assessment

## 2021-03-26 NOTE — Progress Notes (Signed)
PROGRESS NOTE    Sandra Horne  FTD:[id number] DOB: [birth year] DOA: 03/25/2021 PCP: Pcp, No   Chief Complain: Headache, weakness, confusion  Brief Narrative: Patient is a 65 year old female with history of recently diagnosed left infrahilar lung mass on September 2021 but lost follow-up who presented to the emergency department at [facility] with complaints of headache, weakness, confusion.  She was seen in the emergency department on 12/18 for the same and was found to have vasogenic edema, CT chest showed hilar mass concerning for malignancy and was referred to Dr. Delton Coombes and was discharged from ED with Decadron but she lost her phone and was unable to make an appointment, her headache was worsening and thus presented to the emergency department.  Patient was then transferred to [facility] for consultation to radiation oncology.  PCCM also consulted, oncology following here.  Plan is for endobronchial ultrasound and biopsy of left infrahilar lung mass.  Assessment & Plan:   Principal Problem:   Acute encephalopathy Active Problems:   Lung mass   Metastatic lung cancer: She has history of recently diagnosed left infrahilar lung mass on September 2021 but she lost follow-up with Dr Delton Coombes.  Presented with headache, confusion this time. Imagings done in the emergency department showed enlarging left hilar mass measuring 4.4X 3.3 cm consistent with known primary lung neoplasm, extensive vasogenic edema of the left cerebrum, left occipital lobe mass likely consistent with metastatic disease.  Patient was then transferred to [facility] for consultation to radiation oncology.  PCCM also consulted, oncology following here.  Plan is for endobronchial ultrasound and biopsy of left infrahilar lung mass. Started on Decadron. PCCM consulted here and she is undergoing endobronchial ultrasound and biopsy. Oncology, PCCM following.  Radiation oncology is also  involved.  Plan is to proceed with additional treatment to the brain before any systemic therapy. She will follow-up with Dr. Delton Coombes as an outpatient.  Leukocytosis: This is most likely secondary to steroid therapy.  Continue to monitor  Normocytic anemia: Hemoglobin currently stable in the range of 11-12.  Iron studies showed low iron at 19.  Ordered IV iron.  Continue oral supplementation on discharge.  Tobacco use: Heavy smoker in the past, smokes few cigarettes daily.  Counseled for cessation  Uninsured status: Patient does not have a PCP.  Has history of noncompliance.  TOC consulted            DVT prophylaxis:SCD Code Status: Full Family Communication: Husband at bedside Patient status:Inpatient  Dispo: The patient is from: Home              Anticipated d/c is CW:CBJS               Anticipated d/c date is: In next 2 to 3 days.  Radiation oncology supposed to start radiation treatment here before discharge with plan to follow-up with oncology and radiation oncology as an outpatient  Consultants: Oncology, PCCM  Procedures: None yet  Antimicrobials:  Anti-infectives (From admission, onward)    None       Subjective: Patient seen and examined at the bedside this morning.  Hemodynamically stable and comfortable.  Denies any headache, nausea or vomiting.  Alert and oriented.  Objective: Vitals:   03/25/21 2117 03/26/21 0143 03/26/21 0438 03/26/21 1255  BP: 137/82 132/76 136/81 135/61  Pulse: 86 69 76 76  Resp: 20 14 14 14   Temp: 98.1 F (36.7 C) 98.4 F (36.9 C) 98 F (36.7 C) 98.4 F (36.9 C)  TempSrc: Oral Oral Oral Oral  SpO2: 97% 100% 100% 100%  Weight:    62.5 kg  Height:    5\' 4"  (1.626 m)    Intake/Output Summary (Last 24 hours) at 03/26/2021 1325 Last data filed at 03/26/2021 1225 Gross per 24 hour  Intake 0 ml  Output --  Net 0 ml   Filed Weights   03/25/21 0615 03/25/21 1331 03/26/21 1255  Weight: 65.8 kg 62.5 kg 62.5 kg     Examination:  General exam: Overall comfortable, not in distress HEENT: PERRL Respiratory system:  no wheezes or crackles  Cardiovascular system: S1 & S2 heard, RRR.  Gastrointestinal system: Abdomen is nondistended, soft and nontender. Central nervous system: Alert and oriented Extremities: No edema, no clubbing ,no cyanosis Skin: No rashes, no ulcers,no icterus      Data Reviewed: I have personally reviewed following labs and imaging studies  CBC: Recent Labs  Lab 03/25/21 0709 03/26/21 0649  WBC 12.3* 19.2*  NEUTROABS 6.8  --   HGB 11.5* 11.8*  HCT 35.2* 35.3*  MCV 88.9 86.9  PLT 413* 960*   Basic Metabolic Panel: Recent Labs  Lab 03/25/21 0709 03/26/21 0649  NA 137 139  K 3.9 4.1  CL 106 106  CO2 26 26  GLUCOSE 127* 163*  BUN 8 11  CREATININE 0.62 0.64  CALCIUM 8.4* 8.6*  MG  --  2.3   GFR: Estimated Creatinine Clearance: 61.3 mL/min (by C-G formula based on SCr of 0.64 mg/dL). Liver Function Tests: Recent Labs  Lab 03/25/21 0709  AST 24  ALT 39  ALKPHOS 70  BILITOT 0.5  PROT 6.1*  ALBUMIN 2.9*   No results for input(s): LIPASE, AMYLASE in the last 168 hours. No results for input(s): AMMONIA in the last 168 hours. Coagulation Profile: Recent Labs  Lab 03/25/21 0709  INR 1.0   Cardiac Enzymes: No results for input(s): CKTOTAL, CKMB, CKMBINDEX, TROPONINI in the last 168 hours. BNP (last 3 results) No results for input(s): PROBNP in the last 8760 hours. HbA1C: Recent Labs    03/25/21 0709  HGBA1C 6.5*   CBG: No results for input(s): GLUCAP in the last 168 hours. Lipid Profile: No results for input(s): CHOL, HDL, LDLCALC, TRIG, CHOLHDL, LDLDIRECT in the last 72 hours. Thyroid Function Tests: No results for input(s): TSH, T4TOTAL, FREET4, T3FREE, THYROIDAB in the last 72 hours. Anemia Panel: Recent Labs    03/25/21 0709  VITAMINB12 537  FOLATE 6.6  FERRITIN 127  TIBC 246*  IRON 19*  RETICCTPCT 1.6   Sepsis Labs: No  results for input(s): PROCALCITON, LATICACIDVEN in the last 168 hours.  Recent Results (from the past 240 hour(s))  Resp Panel by RT-PCR (Flu A&B, Covid) Nasopharyngeal Swab     Status: None   Collection Time: 03/25/21 10:44 AM   Specimen: Nasopharyngeal Swab; Nasopharyngeal(NP) swabs in vial transport medium  Result Value Ref Range Status   SARS Coronavirus 2 by RT PCR NEGATIVE NEGATIVE Final    Comment: (NOTE) SARS-CoV-2 target nucleic acids are NOT DETECTED.  The SARS-CoV-2 RNA is generally detectable in upper respiratory specimens during the acute phase of infection. The lowest concentration of SARS-CoV-2 viral copies this assay can detect is 138 copies/mL. A negative result does not preclude SARS-Cov-2 infection and should not be used as the sole basis for treatment or other patient management decisions. A negative result may occur with  improper specimen collection/handling, submission of specimen other than nasopharyngeal swab, presence of viral mutation(s) within the areas  targeted by this assay, and inadequate number of viral copies(<138 copies/mL). A negative result must be combined with clinical observations, patient history, and epidemiological information. The expected result is Negative.  Fact Sheet for Patients:  EntrepreneurPulse.com.au  Fact Sheet for Healthcare Providers:  IncredibleEmployment.be  This test is no t yet approved or cleared by the [place] FDA and  has been authorized for detection and/or diagnosis of SARS-CoV-2 by FDA under an Emergency Use Authorization (EUA). This EUA will remain  in effect (meaning this test can be used) for the duration of the COVID-19 declaration under Section 564(b)(1) of the Act, 21 U.S.C.section 360bbb-3(b)(1), unless the authorization is terminated  or revoked sooner.       Influenza A by PCR NEGATIVE NEGATIVE Final   Influenza B by PCR NEGATIVE NEGATIVE Final    Comment:  (NOTE) The Xpert Xpress SARS-CoV-2/FLU/RSV plus assay is intended as an aid in the diagnosis of influenza from Nasopharyngeal swab specimens and should not be used as a sole basis for treatment. Nasal washings and aspirates are unacceptable for Xpert Xpress SARS-CoV-2/FLU/RSV testing.  Fact Sheet for Patients: EntrepreneurPulse.com.au  Fact Sheet for Healthcare Providers: IncredibleEmployment.be  This test is not yet approved or cleared by the [place] FDA and has been authorized for detection and/or diagnosis of SARS-CoV-2 by FDA under an Emergency Use Authorization (EUA). This EUA will remain in effect (meaning this test can be used) for the duration of the COVID-19 declaration under Section 564(b)(1) of the Act, 21 U.S.C. section 360bbb-3(b)(1), unless the authorization is terminated or revoked.  Performed at [facility], [address], [place], [place] [address]          Radiology Studies: CT Head Wo Contrast  Result Date: 03/25/2021 CLINICAL DATA:  Left-sided headache for 3 days EXAM: CT HEAD WITHOUT CONTRAST TECHNIQUE: Contiguous axial images were obtained from the base of the skull through the vertex without intravenous contrast. COMPARISON:  Ten days ago FINDINGS: Brain: Extensive vasogenic type edema in the left cerebral hemisphere with local mass effect. Suspect a lesion at the left occipital lobe where there faint crescentic isodense edema crossing the low-density white matter, likely the margin of the mass. No evidence of superimposed infarction or hemorrhage. No hydrocephalus. Vascular: No hyperdense vessel or unexpected calcification. Skull: Normal. Negative for fracture or focal lesion. Sinuses/Orbits: No acute finding. IMPRESSION: Continued extensive vasogenic edema in the left cerebrum with probable underlying left occipital lobe mass. Metastatic disease is most likely given the recent chest CT findings. Electronically Signed    By: Jorje Guild M.D.   On: 03/25/2021 06:53   MR BRAIN W WO CONTRAST  Result Date: 03/25/2021 CLINICAL DATA:  Primary bronchogenic carcinoma. Metastatic disease evaluation. EXAM: MRI HEAD WITHOUT AND WITH CONTRAST TECHNIQUE: Multiplanar, multiecho pulse sequences of the brain and surrounding structures were obtained without and with intravenous contrast. CONTRAST:  69mL GADAVIST GADOBUTROL 1 MMOL/ML IV SOLN COMPARISON:  10/17/2018 FINDINGS: Brain: No acute infarct, mass effect or extra-axial collection. No acute or chronic hemorrhage. Peripherally contrast-enhancing lesion of the left occipital lobe measuring 2.2 x 2.0 x 2.6 cm. There is a large amount of surrounding edema. There are no other contrast-enhancing lesions. The midline structures are normal. There is 8 mm of rightward midline shift. Vascular: Major flow voids are preserved. Skull and upper cervical spine: Normal calvarium and skull base. Visualized upper cervical spine and soft tissues are normal. Sinuses/Orbits:No paranasal sinus fluid levels or advanced mucosal thickening. No mastoid or middle ear effusion. Normal  orbits. Tornwaldt cyst incidentally noted. IMPRESSION: 1. Peripherally contrast-enhancing lesion of the left occipital lobe with large amount of surrounding edema. This could be a solitary metastasis or a primary brain neoplasm. 2. Rightward midline shift of 8 mm. Electronically Signed   By: Ulyses Jarred M.D.   On: 03/25/2021 21:45        Scheduled Meds:  [MAR Hold] dexamethasone (DECADRON) injection  4 mg Intravenous Q6H   Continuous Infusions:   LOS: 1 day    Time spent: 35 mins.More than 50% of that time was spent in counseling and/or coordination of care.      Shelly Coss, MD [facility] P12/29/2022, 1:25 PM

## 2021-03-26 NOTE — Consult Note (Signed)
Radiation Oncology         [phone]) [phone] ________________________________  Initial Inpatient Consultation  Name: Sandra Horne MRN: [id number]  Date of Service: 03/25/2021 DOB: 1955-08-18  CC:Pcp, No  No ref. provider found   REFERRING PHYSICIAN: No ref. 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    ICD-10-CM   1. Lung mass  R91.8     2. Brain metastasis ([place])  C79.31     3. Acute encephalopathy  G93.40     4. S/P bronchoscopy  Z98.890 CANCELED: DG CHEST PORT 1 VIEW    CANCELED: DG CHEST PORT 1 VIEW      HISTORY OF PRESENT ILLNESS: Sandra Horne is a 65 y.o. female seen at the request of Dr. Manuella Ghazi.  She initially presented to [facility] emergency department on 12/13/2019 with complaints of bilateral lower back pain radiating into the groin and a lung mass was discovered incidentally on the CT scans performed for work-up.  The CT chest showed bulky left hilar and prevascular adenopathy with a left infrahilar mass measuring 3.1 x 2.7 cm, concerning for primary pulmonary neoplasm.  She was referred to Dr. Delton Coombes on 01/14/2020 for further evaluation and the recommendation was to proceed with PET scans and MRI brain for disease staging with plans to arrange for biopsy following PET scan.  The MRI brain scan was performed on 01/17/2020 and was without any evidence of acute intracranial abnormality or evidence of metastatic disease.  The PET scan was performed on 02/04/2020 confirming a hypermetabolic left hilar masslike nodal enlargement, consistent with malignancy but no evidence of metastatic disease in the abdomen or pelvis.  Unfortunately, she was lost to follow-up thereafter.  More recently, she presented to the emergency department at [facility] on 03/15/2021 with complaints of headache, generalized weakness and decreased appetite.  A CT head without contrast was performed and  demonstrated a large area of hypodensity in the left frontal, parietal and temporal lobe, worrisome for vasogenic edema with mass-effect and a 6 mm midline shift to the right.  The recommendation was for further evaluation with MRI brain to evaluate for metastatic disease or other underlying brain lesion.  The CT chest scan showed progression of the left hilar/infrahilar mass lesion, now measuring 4.4 x 3.3 cm with associated hilar and mediastinal lymph nodes.  She was started on Decadron 4 mg p.o. twice daily and discharged home with instruction to follow-up with Dr. Delton Coombes and medical oncology for further evaluation.  Unfortunately, she lost her phone and was unable to make a follow-up appointment.  She did continue taking the Decadron as prescribed which was initially helping her headaches but over the past few days, the headaches progressively worsened, prompting her to return to the emergency department at [facility] on 03/25/2021.  Her husband also reported that she has had periods of confusion and difficulty walking over the past week.  A CT head showed continued extensive vasogenic edema in the left cerebrum with probable underlying left occipital lobe mass felt most likely to be related to metastatic disease given her most recent CT chest findings.  An MRI brain was performed for further evaluation and demonstrated a solitary, peripherally enhancing lesion of the left occipital lobe measuring 2.2 x 2.0 x 2.6 cm with a large amount of surrounding edema resulting in a rightward midline shift of 8 mm.  She was started on IV Decadron and transferred to [facility] hospital  for further evaluation and expedited work-up.  She does report significant improvement in her headache and mental clarity since starting the IV steroids.  Dr. Chase Caller from pulmonology was consulted and the patient is scheduled for endobronchial ultrasound-guided bronchoscopy with Dr. Rodman Pickle this afternoon at 1 PM.  We have been  asked to consult the patient for consideration of radiotherapy in the management of her metastatic disease to the brain. We have been in discussion with Dr. Reatha Armour of neurosurgery and consensus recommendation, pending pathology, is that this solitary brain lesion could be managed with pre-op SRS followed by craniotomy for surgical resection versus SRS alone.  Unfortunately, up to this point, the patient has been very resistant to any consideration of systemic or radiation therapy.  PREVIOUS RADIATION THERAPY: No  PAST MEDICAL HISTORY: History reviewed. No pertinent past medical history.    PAST SURGICAL HISTORY: Past Surgical History:  Procedure Laterality Date   ECTOPIC PREGNANCY SURGERY      FAMILY HISTORY:  Family History  Problem Relation Age of Onset   Hypertension Mother    Stroke Mother    Hypertension Father    Cancer Sister    Cancer Paternal Uncle     SOCIAL HISTORY:  Social History   Socioeconomic History   Marital status: Widowed    Spouse name: Not on file   Number of children: Not on file   Years of education: Not on file   Highest education level: Not on file  Occupational History   Not on file  Tobacco Use   Smoking status: Some Days    Packs/day: 0.25    Years: 40.00    Pack years: 10.00    Types: Cigarettes   Smokeless tobacco: Never  Vaping Use   Vaping Use: Never used  Substance and Sexual Activity   Alcohol use: Yes    Comment: drinks beer rarely    Drug use: Not Currently    Types: Marijuana    Comment: last marijuana use mid 11/19/2019   Sexual activity: Yes    Birth control/protection: None  Other Topics Concern   Not on file  Social History Narrative   Not on file   Social Determinants of Health   Financial Resource Strain: Not on file  Food Insecurity: Not on file  Transportation Needs: Not on file  Physical Activity: Not on file  Stress: Not on file  Social Connections: Not on file  Intimate Partner Violence: Not on file     ALLERGIES: Patient has no known allergies.  MEDICATIONS:  Current Facility-Administered Medications  Medication Dose Route Frequency Provider Last Rate Last Admin   acetaminophen (TYLENOL) tablet 650 mg  650 mg Oral Q6H PRN Manuella Ghazi, Pratik D, DO       dexamethasone (DECADRON) injection 4 mg  4 mg Intravenous Q6H Shah, Pratik D, DO   4 mg at 03/26/21 2094   ferric gluconate (FERRLECIT) 250 mg in sodium chloride 0.9 % 250 mL IVPB  250 mg Intravenous Daily Shelly Coss, MD       Derrill Memo ON 03/27/2021] ferrous sulfate tablet 325 mg  325 mg Oral Q breakfast Shelly Coss, MD       ondansetron (ZOFRAN) tablet 4 mg  4 mg Oral Q6H PRN Manuella Ghazi, Pratik D, DO       Or   ondansetron (ZOFRAN) injection 4 mg  4 mg Intravenous Q6H PRN Manuella Ghazi, Pratik D, DO       oxyCODONE (Oxy IR/ROXICODONE) immediate release tablet 5 mg  5 mg  Oral Q4H PRN Heath Lark D, DO        REVIEW OF SYSTEMS:  On review of systems, the patient reports that she is doing well overall.  She reports significant improvement in the headache and mental clarity since starting IV steroids on admission.  She is currently without complaints and specifically denies any chest pain, shortness of breath, cough, fevers, chills, or night sweats.  She denies any bowel or bladder disturbances, and denies abdominal pain, nausea or vomiting.  She denies any new musculoskeletal or joint aches or pains. A complete review of systems is obtained and is otherwise negative.  PHYSICAL EXAM:  Wt Readings from Last 3 Encounters:  03/26/21 137 lb 12.6 oz (62.5 kg)  03/15/21 145 lb (65.8 kg)  03/05/21 145 lb (65.8 kg)   Temp Readings from Last 3 Encounters:  03/26/21 97.7 F (36.5 C) (Oral)  03/15/21 98 F (36.7 C) (Oral)  03/05/21 98.3 F (36.8 C) (Oral)   BP Readings from Last 3 Encounters:  03/26/21 (!) 134/59  03/15/21 125/78  03/05/21 125/74   Pulse Readings from Last 3 Encounters:  03/26/21 72  03/15/21 90  03/05/21 81   Pain  Assessment Pain Score: 0-No pain/10  In general this is a well appearing African-American female in no acute distress.  She's alert and oriented x4 and appropriate throughout the examination. Cardiopulmonary assessment is negative for acute distress and she exhibits normal effort.   KPS = 80  100 - Normal; no complaints; no evidence of disease. 90   - Able to carry on normal activity; minor signs or symptoms of disease. 80   - Normal activity with effort; some signs or symptoms of disease. 24   - Cares for self; unable to carry on normal activity or to do active work. 60   - Requires occasional assistance, but is able to care for most of his personal needs. 50   - Requires considerable assistance and frequent medical care. 14   - Disabled; requires special care and assistance. 25   - Severely disabled; hospital admission is indicated although death not imminent. 23   - Very sick; hospital admission necessary; active supportive treatment necessary. 10   - Moribund; fatal processes progressing rapidly. 0     - Dead  Karnofsky DA, Abelmann [place], Craver LS and Burchenal [facility] [phone]) The use of the nitrogen mustards in the palliative treatment of carcinoma: with particular reference to bronchogenic carcinoma Cancer 1 634-56  LABORATORY DATA:  Lab Results  Component Value Date   WBC 19.2 (H) 03/26/2021   HGB 11.8 (L) 03/26/2021   HCT 35.3 (L) 03/26/2021   MCV 86.9 03/26/2021   PLT 425 (H) 03/26/2021   Lab Results  Component Value Date   NA 139 03/26/2021   K 4.1 03/26/2021   CL 106 03/26/2021   CO2 26 03/26/2021   Lab Results  Component Value Date   ALT 39 03/25/2021   AST 24 03/25/2021   ALKPHOS 70 03/25/2021   BILITOT 0.5 03/25/2021     RADIOGRAPHY: DG Abdomen 1 View  Result Date: 03/05/2021 CLINICAL DATA:  Abdominal pain, constipation. EXAM: ABDOMEN - 1 VIEW COMPARISON:  None. FINDINGS: The bowel gas pattern is normal. No definite nephrolithiasis is noted. Phleboliths are noted  in the pelvis. IMPRESSION: No abnormal bowel dilatation is noted. Electronically Signed   By: Marijo Conception M.D.   On: 03/05/2021 08:59   CT Head Wo Contrast  Result Date: 03/25/2021 CLINICAL DATA:  Left-sided  headache for 3 days EXAM: CT HEAD WITHOUT CONTRAST TECHNIQUE: Contiguous axial images were obtained from the base of the skull through the vertex without intravenous contrast. COMPARISON:  Ten days ago FINDINGS: Brain: Extensive vasogenic type edema in the left cerebral hemisphere with local mass effect. Suspect a lesion at the left occipital lobe where there faint crescentic isodense edema crossing the low-density white matter, likely the margin of the mass. No evidence of superimposed infarction or hemorrhage. No hydrocephalus. Vascular: No hyperdense vessel or unexpected calcification. Skull: Normal. Negative for fracture or focal lesion. Sinuses/Orbits: No acute finding. IMPRESSION: Continued extensive vasogenic edema in the left cerebrum with probable underlying left occipital lobe mass. Metastatic disease is most likely given the recent chest CT findings. Electronically Signed   By: Jorje Guild M.D.   On: 03/25/2021 06:53   CT Head Wo Contrast  Result Date: 03/15/2021 CLINICAL DATA:  Headache. COVID 2 weeks ago. History of lung cancer. EXAM: CT HEAD WITHOUT CONTRAST TECHNIQUE: Contiguous axial images were obtained from the base of the skull through the vertex without intravenous contrast. COMPARISON:  CT head 04/23/2012.  MRI brain 01/17/2020. FINDINGS: Brain: There is white matter hypodensity throughout the left frontal parietal and temporal lobes with preservation of gray-white matter distinction concerning for vasogenic edema. There is associated sulcal effacement and mass effect with 6 mm of midline shift to the right. There is mild compression of the left lateral ventricle. The ventricles are normal in size. There is no acute intracranial hemorrhage or extra-axial fluid collection.  Vascular: No hyperdense vessel or unexpected calcification. Skull: Normal. Negative for fracture or focal lesion. Sinuses/Orbits: No acute finding. Other: None. IMPRESSION: 1. Large area of hypodensity in the left frontal, parietal and temporal lobe worrisome for vasogenic edema. There is mass effect with 6 mm of midline shift to the right. Recommend further evaluation with MRI with and without contrast to evaluate for metastatic disease or other underlying brain lesion. 2. No acute intracranial hemorrhage. Electronically Signed   By: Ronney Asters M.D.   On: 03/15/2021 19:42   CT Chest W Contrast  Result Date: 03/15/2021 CLINICAL DATA:  History of recent COVID diagnosis 2 weeks ago with increased weakness EXAM: CT CHEST WITH CONTRAST TECHNIQUE: Multidetector CT imaging of the chest was performed during intravenous contrast administration. CONTRAST:  91mL OMNIPAQUE IOHEXOL 300 MG/ML  SOLN COMPARISON:  Chest x-ray from earlier in the same day, CT from 12/13/2019. FINDINGS: Cardiovascular: Thoracic aorta shows no aneurysmal dilatation or dissection. Pulmonary artery as visualized is within normal limits. Mild coronary calcifications are noted. No cardiac enlargement is seen. Mediastinum/Nodes: The thyroid gland demonstrates mild heterogeneity with a 17 mm bilobed partially calcified nodule within the right lobe of the thyroid. The overall appearance has decreased in the interval from a prior exam from 2021. Scattered small mediastinal lymph nodes are noted which appears stable when compared with the prior exam. The esophagus as visualized is within normal limits. Prominent left hilar lymph node is noted along the anterior aspect of the left pulmonary artery measuring 12 mm but previously shown to be hypermetabolic on prior PET-CT. Lungs/Pleura: Lungs are well aerated bilaterally. There is again a mass lesion identified in the left hilum/infrahilar region which measures 4.4 x 3.3 cm in greatest dimension. This is  similar in appearance to that seen on prior PET-CT from 2021 and again suspicious for primary pulmonary neoplasm. Some mild patchy reticulonodular changes are noted in the left lower lobe likely related to some postobstructive change. These  changes are new from the prior PET-CT. No other focal nodule is seen. Upper Abdomen: Mild heterogeneity of the liver is noted without focal mass. Gallbladder is within normal limits. The remainder of the upper abdomen is unremarkable. Musculoskeletal: Degenerative changes of the thoracic spine are seen. No acute rib abnormality is noted. No compression deformities are noted. IMPRESSION: Left hilar/infrahilar mass lesion measuring 4.4 x 3.3 cm. 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 Associated hilar and mediastinal lymph nodes are noted. Correlate with the clinical history as to any prior workup following previous PET-CT in 2021. Some new postobstructive inflammatory changes noted in the left lower lobe when compared with the prior PET-CT. Focal 17 mm nodule in the right lobe of the thyroid. This is decreased in size when compared with prior CT of the chest from 2021. Recommend nonemergent thyroid US (ref: J Am Coll Radiol. 2015 Feb;12(2): 143-50). Heterogeneity of the liver without focal mass. Electronically Signed   By: Inez Catalina M.D.   On: 03/15/2021 19:54   MR BRAIN W WO CONTRAST  Result Date: 03/25/2021 CLINICAL DATA:  Primary bronchogenic carcinoma. Metastatic disease evaluation. EXAM: MRI HEAD WITHOUT AND WITH CONTRAST TECHNIQUE: Multiplanar, multiecho pulse sequences of the brain and surrounding structures were obtained without and with intravenous contrast. CONTRAST:  34mL GADAVIST GADOBUTROL 1 MMOL/ML IV SOLN COMPARISON:  10/17/2018 FINDINGS: Brain: No acute infarct, mass effect or extra-axial collection. No acute or chronic hemorrhage. Peripherally contrast-enhancing lesion of the left occipital lobe measuring 2.2 x 2.0 x 2.6 cm. There is a  large amount of surrounding edema. There are no other contrast-enhancing lesions. The midline structures are normal. There is 8 mm of rightward midline shift. Vascular: Major flow voids are preserved. Skull and upper cervical spine: Normal calvarium and skull base. Visualized upper cervical spine and soft tissues are normal. Sinuses/Orbits:No paranasal sinus fluid levels or advanced mucosal thickening. No mastoid or middle ear effusion. Normal orbits. Tornwaldt cyst incidentally noted. IMPRESSION: 1. Peripherally contrast-enhancing lesion of the left occipital lobe with large amount of surrounding edema. This could be a solitary metastasis or a primary brain neoplasm. 2. Rightward midline shift of 8 mm. Electronically Signed   By: Ulyses Jarred M.D.   On: 03/25/2021 21:45   DG Chest Port 1 View  Result Date: 03/15/2021 CLINICAL DATA:  Cough and weakness. EXAM: PORTABLE CHEST 1 VIEW COMPARISON:  Chest x-ray 04/23/2012. FINDINGS: There are minimal strandy opacities in the left lung base favored is atelectasis. The lungs are otherwise clear. There is no pleural effusion or pneumothorax. The cardiomediastinal silhouette is within normal limits. No acute fractures. IMPRESSION: 1. Left basilar atelectasis. Electronically Signed   By: Ronney Asters M.D.   On: 03/15/2021 16:45      IMPRESSION/PLAN: 1. 65 y.o. 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 Today, I talked to the patient and family (husband and son at bedside) about the findings and workup thus far. We discussed the natural history of suspected non-small cell lung cancer with solitary metastasis to the brain and general treatment, highlighting the role of radiotherapy in the management.  At this point, the patient would potentially benefit from radiotherapy. The options include whole brain irradiation versus stereotactic radiosurgery and we discussed that our final treatment  recommendation would be dependent on the final pathology. There are pros and cons associated with each of these potential treatment options. Whole brain radiotherapy would treat the known metastatic deposits and help provide some  reduction of risk for future brain metastases. However, whole brain radiotherapy carries potential risks including hair loss, subacute somnolence, and neurocognitive changes including a possible reduction in short-term memory. Whole brain radiotherapy also may carry a lower likelihood of tumor control at the treatment sites because of the low-dose used. Stereotactic radiosurgery carries a higher likelihood for local tumor control at the targeted sites with lower associated risk for neurocognitive changes such as memory loss. However, the use of stereotactic radiosurgery in this setting may leave the patient at increased risk for new brain metastases elsewhere in the brain as high as 50-60%. Accordingly, patients who receive stereotactic radiosurgery in this setting should undergo ongoing surveillance imaging with brain MRI more frequently in order to identify and treat new small brain metastases before they become symptomatic. Stereotactic radiosurgery does carry some different risks, including a risk of radionecrosis.  We discussed the dilemma regarding whole brain radiotherapy versus stereotactic radiosurgery. We discussed the pros and cons of each. We also discussed the logistics and delivery of each. We reviewed the results associated with each of the treatments described above. The patient seems to understand the treatment options but is undecided regarding whether or not she would like to proceed with any treatment of her disease and would like to take some additional time to consider the options.  I will plan to follow-up with the patient again in the morning to assess her readiness to proceed with treatment planning and have tentatively held an appointment at 10 AM on Friday 12th  30 2022 for CT simulation so that there are no further treatment delays if she is in agreement to proceed.  Ideally, we would like to get the treatment planning completed prior to her discharge home and we will plan to follow-up on an outpatient basis to confirm treatment recommendations once we have final pathology.  I enjoyed meeting her and her family today and look forward to continuing to participate in her care.  I personally spent 70 minutes in this encounter including chart review, reviewing radiological studies, meeting face-to-face with the patient, entering orders and completing documentation.    Nicholos Johns, PA-C    Tyler Pita, MD  [facility] Direct Dial: [phone]   Fax: [phone] [email or web address]   Skype   LinkedIn

## 2021-03-26 NOTE — Anesthesia Preprocedure Evaluation (Deleted)
Anesthesia Evaluation  Patient identified by MRN, date of birth, ID band Patient awake    Reviewed: Allergy & Precautions, NPO status , Patient's Chart, lab work & pertinent test results  Airway Mallampati: II  TM Distance: >3 FB Neck ROM: Full    Dental no notable dental hx.    Pulmonary Current Smoker,  Metastatic lung cancer   Pulmonary exam normal breath sounds clear to auscultation       Cardiovascular negative cardio ROS Normal cardiovascular exam Rhythm:Regular Rate:Normal     Neuro/Psych Brain mets negative psych ROS   GI/Hepatic negative GI ROS, Neg liver ROS,   Endo/Other  negative endocrine ROS  Renal/GU negative Renal ROS  negative genitourinary   Musculoskeletal negative musculoskeletal ROS (+)   Abdominal   Peds negative pediatric ROS (+)  Hematology negative hematology ROS (+) anemia ,   Anesthesia Other Findings   Reproductive/Obstetrics negative OB ROS                             Anesthesia Physical Anesthesia Plan  ASA: 4  Anesthesia Plan: MAC   Post-op Pain Management:    Induction: Intravenous  PONV Risk Score and Plan: 2 and Propofol infusion and Treatment may vary due to age or medical condition  Airway Management Planned: Nasal Cannula  Additional Equipment:   Intra-op Plan:   Post-operative Plan:   Informed Consent: I have reviewed the patients History and Physical, chart, labs and discussed the procedure including the risks, benefits and alternatives for the proposed anesthesia with the patient or authorized representative who has indicated his/her understanding and acceptance.     Dental advisory given  Plan Discussed with: CRNA and Surgeon  Anesthesia Plan Comments:         Anesthesia Quick Evaluation

## 2021-03-26 NOTE — Interval H&P Note (Signed)
History and Physical Interval Note:  03/26/2021 1:07 PM  Sandra Horne  has presented today for surgery, with the diagnosis of left hilar mass.  The various methods of treatment have been discussed with the patient and family. After consideration of risks, benefits and other options for treatment, the patient has consented to  Procedure(s): ENDOBRONCHIAL ULTRASOUND (Bilateral) as a surgical intervention.  The patient's history has been reviewed, patient examined, no change in status, stable for surgery.  I have reviewed the patient's chart and labs.  Questions were answered to the patient's satisfaction.     Rayanne Padmanabhan Rodman Pickle

## 2021-03-26 NOTE — Anesthesia Postprocedure Evaluation (Signed)
Anesthesia Post Note  Patient: Sandra Horne  Procedure(s) Performed: ENDOBRONCHIAL ULTRASOUND (Bilateral) BRONCHIAL WASHINGS BRONCHIAL BRUSHINGS BRONCHIAL NEEDLE ASPIRATION BIOPSIES HEMOSTASIS CONTROL     Patient location during evaluation: PACU Anesthesia Type: General Level of consciousness: awake and alert Pain management: pain level controlled Vital Signs Assessment: post-procedure vital signs reviewed and stable Respiratory status: spontaneous breathing, nonlabored ventilation, respiratory function stable and patient connected to nasal cannula oxygen Cardiovascular status: blood pressure returned to baseline and stable Postop Assessment: no apparent nausea or vomiting Anesthetic complications: no   No notable events documented.  Last Vitals:  Vitals:   03/26/21 1530 03/26/21 1556  BP: (!) 134/59 112/71  Pulse: 72 84  Resp: 20 14  Temp:  36.7 C  SpO2: 93% 97%    Last Pain:  Vitals:   03/26/21 1556  TempSrc: Oral  PainSc:                  [name]

## 2021-03-26 NOTE — Anesthesia Preprocedure Evaluation (Signed)
Anesthesia Evaluation  Patient identified by MRN, date of birth, ID band Patient awake    Reviewed: Allergy & Precautions, NPO status , Patient's Chart, lab work & pertinent test results  Airway Mallampati: II  TM Distance: >3 FB Neck ROM: Full    Dental no notable dental hx.    Pulmonary Current Smoker,  Metastatic lung cancer   Pulmonary exam normal breath sounds clear to auscultation       Cardiovascular negative cardio ROS Normal cardiovascular exam Rhythm:Regular Rate:Normal     Neuro/Psych Brain mets negative psych ROS   GI/Hepatic negative GI ROS, Neg liver ROS,   Endo/Other  negative endocrine ROS  Renal/GU negative Renal ROS  negative genitourinary   Musculoskeletal negative musculoskeletal ROS (+)   Abdominal   Peds negative pediatric ROS (+)  Hematology  (+) anemia ,   Anesthesia Other Findings   Reproductive/Obstetrics negative OB ROS                             Anesthesia Physical Anesthesia Plan  ASA: 4  Anesthesia Plan: General   Post-op Pain Management:    Induction: Intravenous  PONV Risk Score and Plan: 2 and Ondansetron, Dexamethasone and Treatment may vary due to age or medical condition  Airway Management Planned: Oral ETT  Additional Equipment:   Intra-op Plan:   Post-operative Plan: Extubation in OR  Informed Consent: I have reviewed the patients History and Physical, chart, labs and discussed the procedure including the risks, benefits and alternatives for the proposed anesthesia with the patient or authorized representative who has indicated his/her understanding and acceptance.     Dental advisory given  Plan Discussed with: CRNA and Surgeon  Anesthesia Plan Comments:         Anesthesia Quick Evaluation

## 2021-03-26 NOTE — Transfer of Care (Signed)
Immediate Anesthesia Transfer of Care Note  Patient: Sandra Horne  Procedure(s) Performed: ENDOBRONCHIAL ULTRASOUND (Bilateral) BRONCHIAL WASHINGS BRONCHIAL BRUSHINGS BRONCHIAL NEEDLE ASPIRATION BIOPSIES HEMOSTASIS CONTROL  Patient Location: PACU and Endoscopy Unit  Anesthesia Type:General  Level of Consciousness: awake, alert  and oriented  Airway & Oxygen Therapy: Patient Spontanous Breathing  Post-op Assessment: Report given to RN and Post -op Vital signs reviewed and stable  Post vital signs: Reviewed and stable  Last Vitals:  Vitals Value Taken Time  BP 139/71 03/26/21 1452  Temp    Pulse 91 03/26/21 1455  Resp 21 03/26/21 1455  SpO2 98 % 03/26/21 1455  Vitals shown include unvalidated device data.  Last Pain:  Vitals:   03/26/21 1255  TempSrc: Oral  PainSc: 0-No pain         Complications: No notable events documented.

## 2021-03-27 ENCOUNTER — Ambulatory Visit
Admit: 2021-03-27 | Discharge: 2021-03-27 | Disposition: A | Discharge status: Home / Self Care | Payer: Medicaid Other | Attending: Radiation Oncology | Admitting: Radiation Oncology

## 2021-03-27 ENCOUNTER — Encounter (HOSPITAL_COMMUNITY): Payer: Self-pay | Admitting: Pulmonary Disease

## 2021-03-27 ENCOUNTER — Encounter: Payer: Self-pay | Admitting: Urology

## 2021-03-27 DIAGNOSIS — C7931 Secondary malignant neoplasm of brain: Secondary | ICD-10-CM | POA: Insufficient documentation

## 2021-03-27 DIAGNOSIS — C3432 Malignant neoplasm of lower lobe, left bronchus or lung: Secondary | ICD-10-CM

## 2021-03-27 LAB — CBC WITH DIFFERENTIAL/PLATELET
Abs Immature Granulocytes: 0.55 10*3/uL — ABNORMAL HIGH (ref 0.00–0.07)
Basophils Absolute: 0 10*3/uL (ref 0.0–0.1)
Basophils Relative: 0 %
Eosinophils Absolute: 0 10*3/uL (ref 0.0–0.5)
Eosinophils Relative: 0 %
HCT: 33.6 % — ABNORMAL LOW (ref 36.0–46.0)
Hemoglobin: 11 g/dL — ABNORMAL LOW (ref 12.0–15.0)
Immature Granulocytes: 2 %
Lymphocytes Relative: 6 %
Lymphs Abs: 1.4 10*3/uL (ref 0.7–4.0)
MCH: 28.5 pg (ref 26.0–34.0)
MCHC: 32.7 g/dL (ref 30.0–36.0)
MCV: 87 fL (ref 80.0–100.0)
Monocytes Absolute: 1 10*3/uL (ref 0.1–1.0)
Monocytes Relative: 4 %
Neutro Abs: 20.4 10*3/uL — ABNORMAL HIGH (ref 1.7–7.7)
Neutrophils Relative %: 88 %
Platelets: 413 10*3/uL — ABNORMAL HIGH (ref 150–400)
RBC: 3.86 MIL/uL — ABNORMAL LOW (ref 3.87–5.11)
RDW: 14.6 % (ref 11.5–15.5)
WBC: 23.4 10*3/uL — ABNORMAL HIGH (ref 4.0–10.5)
nRBC: 0 % (ref 0.0–0.2)

## 2021-03-27 LAB — CYTOLOGY - NON PAP

## 2021-03-27 MED ORDER — DOCUSATE SODIUM 100 MG PO CAPS
100.0000 mg | ORAL_CAPSULE | Freq: Every day | ORAL | Status: DC | PRN
  Administered 2021-03-27 – 2021-03-28 (×2): 100 mg via ORAL
  Filled 2021-03-27 (×2): qty 1

## 2021-03-27 MED ORDER — LORAZEPAM 1 MG PO TABS
1.0000 mg | ORAL_TABLET | ORAL | Status: DC | PRN
  Administered 2021-03-27: 10:00:00 1 mg via ORAL
  Filled 2021-03-27: qty 1

## 2021-03-27 NOTE — Progress Notes (Signed)
PROGRESS NOTE    Sandra Horne  MAU:[id number] DOB: [birth year] DOA: 03/25/2021 PCP: Merryl Hacker, No   Chief Complaint  Patient presents with   Headache    Brief Narrative/Hospital Course: Sandra Horne, 65 y.o. female with PMH of recently diagnosed left infrahilar lung mass on September 2021 but lost follow-up who presented to the emergency department at [facility] with complaints of headache, weakness, confusion.  She was seen in the emergency department on 12/18 for the same and was found to have vasogenic edema, CT chest showed hilar mass concerning for malignancy and was referred to Dr. Delton Coombes and was discharged from ED with Decadron but she lost her phone and was unable to make an appointment, her headache was worsening and thus presented to the emergency department.  Patient was then transferred to [facility] for consultation to radiation oncology.  PCCM also consulted and underwent endobronchial biopsy 12/29 Oncology pulmonary on board.  Subjective: Seen and examined.  Resting comfortably no headache. Overnight afebrile WBC count has trended up, she had endobronchial biopsy yesterday and also on steroid.  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  On IV Decadron 4 mg every 6 hours for vasogenic brain edema symptomatic with headache. Patient is adamant against chemo or radiation, agreed for biopsy.  Await oncology plan Dr. Alen Blew on board.  Radiation oncology planning for CT simulation today.  For radiation oncology waiting for pathology to start treatment and waiting for neurosurgery to weigh in regarding pre-op SRS with resection to follow versus fractionated SRS .   Leukocytosis: Suspect reactive in the setting of IV Decadron use, patient afebrile.  Iron-deficiency anemia in the setting of malignancy getting IV iron therapy resume p.o. on discharge  Tobacco  abuse: Cessation was counseled  Uninsured status with no PCP lost to follow-up and also noncompliant.  THRs been consulted  DVT prophylaxis: Place and maintain sequential compression device Start: 03/26/21 1339 Code Status:   Code Status: Full Code Family Communication: plan of care discussed with patient at bedside. Status is: Inpatient Remains inpatient appropriate because: Ongoing management of her metastatic disease Disposition: Currently not medically stable for discharge. Anticipated Disposition: Home   Objective: Vitals last 24 hrs: Vitals:   03/26/21 1530 03/26/21 1556 03/26/21 2109 03/27/21 0502  BP: (!) 134/59 112/71 103/67 124/87  Pulse: 72 84 70 72  Resp: 20 14 18 16   Temp:  98 F (36.7 C) 98.7 F (37.1 C) 98.2 F (36.8 C)  TempSrc:  Oral Oral Oral  SpO2: 93% 97% 95% 96%  Weight:      Height:       Weight change: 0 kg  Intake/Output Summary (Last 24 hours) at 03/27/2021 5638 Last data filed at 03/27/2021 0123 Gross per 24 hour  Intake 630 ml  Output --  Net 630 ml   Net IO Since Admission: 630 mL [03/27/21 0927]   Physical Examination: General exam: Aa0x3 HEENT:Oral mucosa moist, Ear/Nose WNL grossly,dentition normal. Respiratory system: Bl clear BS, no use of accessory muscle, non tender. Cardiovascular system: S1 & S2 +,No JVD. Gastrointestinal system: Abdomen soft, NT,ND, BS+. Nervous System:Alert, awake, moving extremities. Extremities: edema none, distal peripheral pulses palpable.  Skin: No rashes, no icterus. MSK: Normal muscle bulk, tone, power.  Medications reviewed:  Scheduled Meds:  dexamethasone (DECADRON) injection  4 mg Intravenous Q6H   ferrous sulfate  325 mg Oral Q breakfast   polyethylene glycol  17 g Oral Daily   Continuous Infusions:  ferric gluconate (FERRLECIT) IVPB Stopped (03/26/21 1917)   Diet Order             Diet regular Room service appropriate? Yes; Fluid consistency: Thin  Diet effective now                           Weight change: 0 kg  Wt Readings from Last 3 Encounters:  03/26/21 62.5 kg  03/15/21 65.8 kg  03/05/21 65.8 kg     Consultants:see note  Procedures:see note Antimicrobials: Anti-infectives (From admission, onward)    None      Culture/Microbiology    Component Value Date/Time   SDES  03/15/2021 1633    URINE, CLEAN CATCH Performed at [facility], [address], [place], [place] [address]    [facility]  03/15/2021 1633    NONE Performed at [facility], [address] [place], [place] [address]    CULT (A) 03/15/2021 1633    >=100,000 COLONIES/mL ENTEROCOCCUS FAECALIS >=100,000 COLONIES/mL LACTOBACILLUS SPECIES Standardized susceptibility testing for this organism is not available. Performed at [facility], [place] [address], [place],  [address]    REPTSTATUS 03/18/2021 FINAL 03/15/2021 1633    Other culture-see note  Unresulted Labs (From admission, onward)    None     Data Reviewed: I have personally reviewed following labs and imaging studies CBC: Recent Labs  Lab 03/25/21 0709 03/26/21 0649 03/27/21 0610  WBC 12.3* 19.2* 23.4*  NEUTROABS 6.8  --  20.4*  HGB 11.5* 11.8* 11.0*  HCT 35.2* 35.3* 33.6*  MCV 88.9 86.9 87.0  PLT 413* 425* 751*   Basic Metabolic Panel: Recent Labs  Lab 03/25/21 0709 03/26/21 0649  NA 137 139  K 3.9 4.1  CL 106 106  CO2 26 26  GLUCOSE 127* 163*  BUN 8 11  CREATININE 0.62 0.64  CALCIUM 8.4* 8.6*  MG  --  2.3   GFR: Estimated Creatinine Clearance: 61.3 mL/min (by C-G formula based on SCr of 0.64 mg/dL). Liver Function Tests: Recent Labs  Lab 03/25/21 0709  AST 24  ALT 39  ALKPHOS 70  BILITOT 0.5  PROT 6.1*  ALBUMIN 2.9*   No results for input(s): LIPASE, AMYLASE in the last 168 hours. No results for input(s): AMMONIA in the last 168 hours. Coagulation Profile: Recent Labs  Lab 03/25/21 0709  INR 1.0   Cardiac Enzymes: No results for input(s): CKTOTAL, CKMB, CKMBINDEX,  TROPONINI in the last 168 hours. BNP (last 3 results) No results for input(s): PROBNP in the last 8760 hours. HbA1C: Recent Labs    03/25/21 0709  HGBA1C 6.5*   CBG: No results for input(s): GLUCAP in the last 168 hours. Lipid Profile: No results for input(s): CHOL, HDL, LDLCALC, TRIG, CHOLHDL, LDLDIRECT in the last 72 hours. Thyroid Function Tests: No results for input(s): TSH, T4TOTAL, FREET4, T3FREE, THYROIDAB in the last 72 hours. Anemia Panel: Recent Labs    03/25/21 0709  VITAMINB12 537  FOLATE 6.6  FERRITIN 127  TIBC 246*  IRON 19*  RETICCTPCT 1.6   Sepsis Labs: No results for input(s): PROCALCITON, LATICACIDVEN in the last 168 hours.  Recent Results (from the past 240 hour(s))  Resp Panel by RT-PCR (Flu A&B, Covid) Nasopharyngeal Swab     Status: None   Collection Time: 03/25/21 10:44 AM   Specimen: Nasopharyngeal Swab; Nasopharyngeal(NP) swabs in vial transport medium  Result Value Ref Range Status  SARS Coronavirus 2 by RT PCR NEGATIVE NEGATIVE Final    Comment: (NOTE) SARS-CoV-2 target nucleic acids are NOT DETECTED.  The SARS-CoV-2 RNA is generally detectable in upper respiratory specimens during the acute phase of infection. The lowest concentration of SARS-CoV-2 viral copies this assay can detect is 138 copies/mL. A negative result does not preclude SARS-Cov-2 infection and should not be used as the sole basis for treatment or other patient management decisions. A negative result may occur with  improper specimen collection/handling, submission of specimen other than nasopharyngeal swab, presence of viral mutation(s) within the areas targeted by this assay, and inadequate number of viral copies(<138 copies/mL). A negative result must be combined with clinical observations, patient history, and epidemiological information. The expected result is Negative.  Fact Sheet for Patients:  EntrepreneurPulse.com.au  Fact Sheet for Healthcare  Providers:  IncredibleEmployment.be  This test is no t yet approved or cleared by the [place] FDA and  has been authorized for detection and/or diagnosis of SARS-CoV-2 by FDA under an Emergency Use Authorization (EUA). This EUA will remain  in effect (meaning this test can be used) for the duration of the COVID-19 declaration under Section 564(b)(1) of the Act, 21 U.S.C.section 360bbb-3(b)(1), unless the authorization is terminated  or revoked sooner.       Influenza A by PCR NEGATIVE NEGATIVE Final   Influenza B by PCR NEGATIVE NEGATIVE Final    Comment: (NOTE) The Xpert Xpress SARS-CoV-2/FLU/RSV plus assay is intended as an aid in the diagnosis of influenza from Nasopharyngeal swab specimens and should not be used as a sole basis for treatment. Nasal washings and aspirates are unacceptable for Xpert Xpress SARS-CoV-2/FLU/RSV testing.  Fact Sheet for Patients: EntrepreneurPulse.com.au  Fact Sheet for Healthcare Providers: IncredibleEmployment.be  This test is not yet approved or cleared by the [place] FDA and has been authorized for detection and/or diagnosis of SARS-CoV-2 by FDA under an Emergency Use Authorization (EUA). This EUA will remain in effect (meaning this test can be used) for the duration of the COVID-19 declaration under Section 564(b)(1) of the Act, 21 U.S.C. section 360bbb-3(b)(1), unless the authorization is terminated or revoked.  Performed at [facility], [address], [place], [place] [address]      Radiology Studies: MR BRAIN W WO CONTRAST  Result Date: 03/25/2021 CLINICAL DATA:  Primary bronchogenic carcinoma. Metastatic disease evaluation. EXAM: MRI HEAD WITHOUT AND WITH CONTRAST TECHNIQUE: Multiplanar, multiecho pulse sequences of the brain and surrounding structures were obtained without and with intravenous contrast. CONTRAST:  64mL GADAVIST GADOBUTROL 1 MMOL/ML IV SOLN COMPARISON:   10/17/2018 FINDINGS: Brain: No acute infarct, mass effect or extra-axial collection. No acute or chronic hemorrhage. Peripherally contrast-enhancing lesion of the left occipital lobe measuring 2.2 x 2.0 x 2.6 cm. There is a large amount of surrounding edema. There are no other contrast-enhancing lesions. The midline structures are normal. There is 8 mm of rightward midline shift. Vascular: Major flow voids are preserved. Skull and upper cervical spine: Normal calvarium and skull base. Visualized upper cervical spine and soft tissues are normal. Sinuses/Orbits:No paranasal sinus fluid levels or advanced mucosal thickening. No mastoid or middle ear effusion. Normal orbits. Tornwaldt cyst incidentally noted. IMPRESSION: 1. Peripherally contrast-enhancing lesion of the left occipital lobe with large amount of surrounding edema. This could be a solitary metastasis or a primary brain neoplasm. 2. Rightward midline shift of 8 mm. Electronically Signed   By: Ulyses Jarred M.D.   On: 03/25/2021 21:45     LOS: 2  days   Antonieta Pert, MD [facility]  03/27/2021, 9:27 AM

## 2021-03-27 NOTE — Progress Notes (Signed)
°  Radiation Oncology         [phone] ________________________________  Name: Sandra Horne MRN: [id number]  Date: 03/27/2021  DOB: 29-Jun-1955  INPATIENT   SIMULATION AND TREATMENT PLANNING NOTE    ICD-10-CM   1. Brain metastasis ([place])  C79.31     2. Primary cancer of left lower lobe of lung ([facility])  C34.32       DIAGNOSIS:  65 yo woman with a solitary 2.6 cm Left Parietal Brain Metastasis from non-small cell cancer of the left lower lobe of the lung  NARRATIVE:  The patient was brought to the [facility].  Identity was confirmed.  All relevant records and images related to the planned course of therapy were reviewed.  The patient freely provided informed written consent to proceed with treatment after reviewing the details related to the planned course of therapy. The consent form was witnessed and verified by the simulation staff. Intravenous access was established for contrast administration. Then, the patient was set-up in a stable reproducible supine position for radiation therapy.  A relocatable thermoplastic stereotactic head frame was fabricated for precise immobilization.  CT images were obtained.  Surface markings were placed.  The CT images were loaded into the planning software and fused with the patient's targeting MRI scan.  Then the target and avoidance structures were contoured.  Treatment planning then occurred.  The radiation prescription was entered and confirmed.  I have requested 3D planning  I have requested a DVH of the following structures: Brain stem, brain, left eye, right eye, lenses, optic chiasm, target volumes, uninvolved brain, and normal tissue.    SPECIAL TREATMENT PROCEDURE:  The planned course of therapy using radiation constitutes a special treatment procedure. Special care is required in the management of this patient for the following reasons. This treatment constitutes a Special Treatment Procedure for the following reason: High dose  per fraction requiring special monitoring for increased toxicities of treatment including daily imaging.  The special nature of the planned course of radiotherapy will require increased physician supervision and oversight to ensure patient's safety with optimal treatment outcomes.  This requires extended time and effort.  PLAN:  The patient will receive either 15 Gy in one fraction pre-op to be followed by craniotomy for resection versus 27 Gy in 3 fractions as definitive radiation therapy, pending neurosurgery consultation.  ________________________________  Sheral Apley Tammi Klippel, M.D.

## 2021-03-27 NOTE — Consult Note (Signed)
Palliative care consult note  Reason for consult: Goals of care in light of lung and brain masses (biopsy results pending)  Palliative care consult received.  Chart reviewed including personal review of pertinent labs and imaging.  Briefly, [name] is a 65 year old female who originally presented to [facility] with complaints of back pain in 2021 and subsequently found to have lung mass.  She was referred to Dr. Delton Coombes and had initial PET and MRI but was lost to follow-up.  She represented to [facility] on 03/15/2021 and a imaging revealed large area of hypodensity worrisome for metastatic lesion in her brain.  Chest CT revealed mass had grown to 4.4 x 3.3 cm with associated hilar and mediastinal lymphadenopathy.  She was started on Decadron and was to follow-up with Dr. Delton Coombes but lost her phone and did not make appointment.  Headaches worsened and she had more confusion and return to the ED at [facility] but was transferred to [facility] for further work-up and consideration for radiation therapy.  She was evaluated by pulmonology and underwent bronchoscopy for biopsy this afternoon.  I met today with [name], her husband, and her 2 daughters.   She reports that the most important thing to her is her family and her faith.  I asked her about her understanding of the situation and she and family minimize what has been going on related to masses noted in her lung and brain.  Her daughter states that we do not know exactly what is going on if biopsy is still pending.  We discussed that, while it is true that we do not have biopsy results, all indications are that she has some sort of cancer with metastatic spread.  Daughters expressed that, "we are not going to claim that" and [name] stated that she thought things were going to get better.  I attempted to explore these feelings further but family returned to the point of saying that they were claiming that she did not have cancer and God  was going to heal her.  We also discussed surrogate decision making.  We talked about advance care planning and [facility] POA paperwork.  She and Sandra Horne consider themselves to be married and she reports that they had bought a marriage license but had never completed it or filed it with the state.  We talked about the fact that this would mean that the state of [place] did not consider that to be legally married and therefore decision making would fall to her adult children unless she completes paperwork indicating otherwise.  All family present including patient, Sandra Horne, and her daughter Sandra Horne, pointed to her daughter Sandra Horne and stated that she would be the person that family would look to in order to make decisions if patient cannot speak for herself.  -Full code/full scope -Biopsy results pending.  At this time, patient and her family state that they are not, "claiming" her cancer and are not open to discussion about potential therapies or long-term goals of care as they are not convinced that she has cancer. -Palliative care will continue to follow peripherally and follow-up if biopsy results return prior to her discharge.  I did talk with her at length about the need to follow-up with outpatient providers including Dr. Delton Coombes.  She did state today that she is not sure that she would consider disease modifying therapy if biopsy reveals cancer diagnosis.  I encouraged her that, even if she does not desire to pursue disease modifying  therapy, she really needs to make it to follow-up to ensure that she has the best information available to make decisions. -She indicates today that her daughter, Sandra Horne, would be the person that family would like to make decisions on her behalf.  Legally, at this time, it would be the majority of her adult children who would act as her surrogate.  Start time: 1500 End time: 1600 Total time: 60 minutes  Greater than 50%  of this time was spent counseling and  coordinating care related to the above assessment and plan.  Micheline Rough, MD [place] Team [phone]

## 2021-03-27 NOTE — Progress Notes (Signed)
I spoke with [name] again this morning and she is in agreement to proceed with the CT SIM so we will be coming to get her around 9:30-9:45am. We will await pathology prior to starting treatment and are waiting for neurosurgery to weigh in regarding pre-op SRS with resection to follow versus fractionated SRS but hopefully they will meet with her today to discuss the options and we can confirm treatment recommendations once we have the final pathology.  Nicholos Johns, MMS, PA-C [place] at [place]: [phone]   Fax: [phone]

## 2021-03-27 NOTE — Op Note (Signed)
[facility] Cardiopulmonary Patient Name: Sandra Horne Procedure Date: 03/26/2021 MRN: [id number] Attending MD: Rodman Pickle , MD Date of Birth: 1955-10-29 CSN: [id number] Age: 65 Admit Type: Outpatient Ethnicity: Not Hispanic or Latino Procedure:             Bronchoscopy Indications:           Hilar lymphadenopathy of the left side Providers:             Rodman Pickle, MD, Jeanella Cara, RN, Tyna Jaksch Technician Referring MD:           Medicines:             See the Anesthesia note for documentation of the                         administered medications Complications:         Significant (20 cc) bleeding requiring epinephrine and                         cold saline. No further bleeding noted after                         intervention. Estimated Blood Loss:  Estimated blood loss was minimal. Procedure:      Pre-Anesthesia Assessment:      - A History and Physical has been performed. Patient meds and allergies       have been reviewed. The risks and benefits of the procedure and the       sedation options and risks were discussed with the patient. All       questions were answered and informed consent was obtained. Patient       identification and proposed procedure were verified prior to the       procedure by the physician in the pre-procedure area. Mental Status       Examination: alert and oriented. Airway Examination: normal       oropharyngeal airway. Respiratory Examination: clear to auscultation. CV       Examination: normal and RRR, no murmurs, no S3 or S4. ASA Grade       Assessment: II - A patient with mild systemic disease. After reviewing       the risks and benefits, the patient was deemed in satisfactory condition       to undergo the procedure. The anesthesia plan was to use general       anesthesia. Immediately prior to administration of medications, the       patient was re-assessed for adequacy to receive  sedatives. The heart       rate, respiratory rate, oxygen saturations, blood pressure, adequacy of       pulmonary ventilation, and response to care were monitored throughout       the procedure. The physical status of the patient was re-assessed after       the procedure.      After obtaining informed consent, the bronchoscope was passed under       direct vision. Throughout the procedure, the patient's blood pressure,       pulse, and oxygen saturations were monitored continuously. the BF-1TH190       ([id number]) Olympus bronoscope was introduced through the nose, via the  endotracheal tube (the patient was intubated for the procedure) and       advanced to the tracheobronchial tree. Mucosa normal with no secretions       seen. LLL with extrinsic airway compression >90% however easily passed       broncoscope to visualize LLL segments. BAL was performed with 120 ml       saline instilled and 40 ml returned. Brushings performed in LLL x 3.       Bleeding post-brushings requiring cold saline and 2 cc diluted 1:10000       epinephrine. Bleeding resolved. Bronchoscope was removed and the       BF-UC190F ([id number]) Olympus Ebus scope was introduced through the and       advanced to the ETT. TBNA of left hilar mass was performed (8 passes -       for three slides and cell block). EBUS was removed and BF-1TH190       ([id number]) Olympus bronoscope was re-introduced. Oozing blood noted.       Additional 3cc of diluted diluted 1:10000 epinephrine was adminstered       with resolution of bleeding. Blood secretions suctioned and cleared.       Bronchscope was removed The procedure was accomplished without       difficulty. Findings:      Brushings of a lesion were obtained in the left lower lobe with a       cytology brush and sent for routine cytology.      Bronchoalveolar lavage was performed in the left lower lobe of the lung       and sent for routine cytology      Transbronchial needle  aspirations of a mass were performed in the left       lower lobe using a Wang 21 gauge needle and sent for routine cytology. Impression:      - Hilar lymphadenopathy of the left side      - Brushings were obtained.      - Bronchoalveolar lavage was performed.      - Endobronchial ultrasound was performed.      - A transbronchial needle aspiration was performed. Moderate Sedation:      General anesthesia Recommendation:      - Await cytology results. Procedure Code(s):      --- Professional ---      [phone], Bronchoscopy, rigid or flexible, including fluoroscopic guidance,       when performed; with transbronchial needle aspiration biopsy(s),       trachea, main stem and/or lobar bronchus(i)      31624, Bronchoscopy, rigid or flexible, including fluoroscopic guidance,       when performed; with bronchial alveolar lavage      [phone], Bronchoscopy, rigid or flexible, including fluoroscopic guidance,       when performed; with brushing or protected brushings      31654, Bronchoscopy, rigid or flexible, including fluoroscopic guidance,       when performed; with transendoscopic endobronchial ultrasound (EBUS)       during bronchoscopic diagnostic or therapeutic intervention(s) for       peripheral lesion(s) (List separately in addition to code for primary       procedure[s]) Diagnosis Code(s):      --- Professional ---      R59.0, Localized enlarged lymph nodes CPT copyright 2019 [employer]. All rights reserved. The codes documented in this report are preliminary and upon coder review may  be revised  to meet current compliance requirements. Rodman Pickle, MD 03/27/2021 11:47:27 AM This report has been signed electronically. Number of Addenda: 0 Scope In: Scope Out:

## 2021-03-27 NOTE — Progress Notes (Signed)
D/w Dr Maren Beach  ccm can sign off    SIGNATURE    Dr. Brand Males, M.D., F.C.C.P,  Pulmonary and Critical Care Medicine Staff Physician, [place] Director - Interstitial Lung Disease  Program  P[facility] [place] [facility] [place], [place], [address]  NPI Number:  NPI #[id number]  Pager: [phone], If no answer  -> Check AMION or Try [phone] Telephone (clinical office): [phone] Telephone (research): [phone]  1:04 PM 03/27/2021

## 2021-03-28 ENCOUNTER — Encounter: Payer: Self-pay | Admitting: Urology

## 2021-03-28 LAB — CBC
HCT: 36.6 % (ref 36.0–46.0)
Hemoglobin: 11.8 g/dL — ABNORMAL LOW (ref 12.0–15.0)
MCH: 28.7 pg (ref 26.0–34.0)
MCHC: 32.2 g/dL (ref 30.0–36.0)
MCV: 89.1 fL (ref 80.0–100.0)
Platelets: 444 10*3/uL — ABNORMAL HIGH (ref 150–400)
RBC: 4.11 MIL/uL (ref 3.87–5.11)
RDW: 14.6 % (ref 11.5–15.5)
WBC: 19.8 10*3/uL — ABNORMAL HIGH (ref 4.0–10.5)
nRBC: 0 % (ref 0.0–0.2)

## 2021-03-28 MED ORDER — DEXAMETHASONE 4 MG PO TABS: 4.0000 mg | ORAL_TABLET | Freq: Two times a day (BID) | ORAL | 0 refills | Status: AC

## 2021-03-28 NOTE — Discharge Summary (Signed)
Physician Discharge Summary  Sandra Horne IOX:[id number] DOB: 29-Dec-1955 DOA: 03/25/2021  PCP: Merryl Hacker, No  Admit date: 03/25/2021 Discharge date: 03/28/2021  Admitted From: home Disposition:  home  Recommendations for Outpatient Follow-up:  Follow up with PCP in 1-2 weeks Please obtain BMP/CBC in one week Follow-up as instructed for SRS brain procedure on April 03, 2021 8:15 AM  Home Health:no  Equipment/Devices: none  Discharge Condition: Stable Code Status:   Code Status: Full Code Diet recommendation:  Diet Order             Diet regular Room service appropriate? Yes; Fluid consistency: Thin  Diet effective now                 Brief/Interim Summary: Sandra Horne, 65 y.o. female with PMH of recently diagnosed left infrahilar lung mass on September 2021 but lost follow-up who presented to the emergency department at [facility] with complaints of headache, weakness, confusion.  She was seen in the emergency department on 12/18 for the same and was found to have vasogenic edema, CT chest showed hilar mass concerning for malignancy and was referred to Dr. Delton Coombes and was discharged from ED with Decadron but she lost her phone and was unable to make an appointment, her headache was worsening and thus presented to the emergency department.  Patient was then transferred to [facility] for consultation to radiation oncology.  PCCM also consulted and underwent endobronchial biopsy 12/29 Oncology pulmonary on board.  Patient had CT simulation yesterday 12/30. Confirmed with Megan from neurosurgery that patient has [place] is planned for Friday, April 03, 2021 8:15 gm and she is fine to discharge home. At this time patient is medically stable for discharge.  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  On IV Decadron 4 mg every 6 hours for vasogenic brain edema  symptomatic with headache. Patient is adamant against chemo or radiation, agreed for biopsy.Radiation oncology did CT simulation 12/30-per radiation oncology waiting for pathology to start treatment and waiting for neurosurgery to weigh in regarding pre-op SRS with resection to follow versus fractionated SRS .  I spoke w/ Meghan from neurosurgery-patient has [place] is planned for Friday, April 03, 2021 8:15 gm and she is fine to discharge home.  Leukocytosis: Suspect reactive in the setting of IV Decadron use, patient afebrile.  Downtrending.  Iron-deficiency anemia in the setting of malignancy getting IV iron therapy resume p.o. on discharge monitor hemoglobin closely  Tobacco abuse: Cessation was counseled  Uninsured status with no PCP lost to follow-up and also noncompliant.  TOC been consulted.  Patient reports that she lost her cell phone so hospital was not able to contact her regarding the cancer diagnosis.  TOC has been involved will provide medication assistance and follow-up.  Husband and wife are aware for need for follow-up follow-up oncology  Consults: Neurosurgery, radiation oncology  Subjective: Aaox3, wants to go home today  Discharge Exam: Vitals:   03/27/21 2047 03/28/21 0505  BP: 130/69 118/72  Pulse: 73 (!) 57  Resp: 14 18  Temp: 98 F (36.7 C) 98.2 F (36.8 C)  SpO2: 95% 97%   General: Pt is alert, awake, not in acute distress Cardiovascular: RRR, S1/S2 +, no rubs, no gallops Respiratory: CTA bilaterally, no wheezing, no rhonchi Abdominal: Soft, NT, ND, bowel sounds + Extremities: no edema, no cyanosis  Discharge Instructions  Discharge Instructions  Discharge instructions   Complete by: As directed    Follow-up with radiation oncology/neurosurgery   Increase activity slowly   Complete by: As directed       Allergies as of 03/28/2021   No Known Allergies      Medication List     TAKE these medications    dexamethasone 4 MG tablet Commonly  known as: DECADRON Take 1 tablet (4 mg total) by mouth 2 (two) times daily with a meal.   polyethylene glycol 17 g packet Commonly known as: MiraLax Take 17 g by mouth daily.        Follow-up Information     Kyung Rudd, MD. Call in 2 day(s).   Specialty: Radiation Oncology Contact information: [address] [place] [address] [phone]                No Known Allergies  The results of significant diagnostics from this hospitalization (including imaging, microbiology, ancillary and laboratory) are listed below for reference.    Microbiology: Recent Results (from the past 240 hour(s))  Resp Panel by RT-PCR (Flu A&B, Covid) Nasopharyngeal Swab     Status: None   Collection Time: 03/25/21 10:44 AM   Specimen: Nasopharyngeal Swab; Nasopharyngeal(NP) swabs in vial transport medium  Result Value Ref Range Status   SARS Coronavirus 2 by RT PCR NEGATIVE NEGATIVE Final    Comment: (NOTE) SARS-CoV-2 target nucleic acids are NOT DETECTED.  The SARS-CoV-2 RNA is generally detectable in upper respiratory specimens during the acute phase of infection. The lowest concentration of SARS-CoV-2 viral copies this assay can detect is 138 copies/mL. A negative result does not preclude SARS-Cov-2 infection and should not be used as the sole basis for treatment or other patient management decisions. A negative result may occur with  improper specimen collection/handling, submission of specimen other than nasopharyngeal swab, presence of viral mutation(s) within the areas targeted by this assay, and inadequate number of viral copies(<138 copies/mL). A negative result must be combined with clinical observations, patient history, and epidemiological information. The expected result is Negative.  Fact Sheet for Patients:  EntrepreneurPulse.com.au  Fact Sheet for Healthcare Providers:  IncredibleEmployment.be  This test is no t yet approved or  cleared by the [place] FDA and  has been authorized for detection and/or diagnosis of SARS-CoV-2 by FDA under an Emergency Use Authorization (EUA). This EUA will remain  in effect (meaning this test can be used) for the duration of the COVID-19 declaration under Section 564(b)(1) of the Act, 21 U.S.C.section 360bbb-3(b)(1), unless the authorization is terminated  or revoked sooner.       Influenza A by PCR NEGATIVE NEGATIVE Final   Influenza B by PCR NEGATIVE NEGATIVE Final    Comment: (NOTE) The Xpert Xpress SARS-CoV-2/FLU/RSV plus assay is intended as an aid in the diagnosis of influenza from Nasopharyngeal swab specimens and should not be used as a sole basis for treatment. Nasal washings and aspirates are unacceptable for Xpert Xpress SARS-CoV-2/FLU/RSV testing.  Fact Sheet for Patients: EntrepreneurPulse.com.au  Fact Sheet for Healthcare Providers: IncredibleEmployment.be  This test is not yet approved or cleared by the [place] FDA and has been authorized for detection and/or diagnosis of SARS-CoV-2 by FDA under an Emergency Use Authorization (EUA). This EUA will remain in effect (meaning this test can be used) for the duration of the COVID-19 declaration under Section 564(b)(1) of the Act, 21 U.S.C. section 360bbb-3(b)(1), unless the authorization is terminated or revoked.  Performed at [facility], [phone]  [address], [place], [place] [address]     Procedures/Studies: DG Abdomen 1 View  Result Date: 03/05/2021 CLINICAL DATA:  Abdominal pain, constipation. EXAM: ABDOMEN - 1 VIEW COMPARISON:  None. FINDINGS: The bowel gas pattern is normal. No definite nephrolithiasis is noted. Phleboliths are noted in the pelvis. IMPRESSION: No abnormal bowel dilatation is noted. Electronically Signed   By: Marijo Conception M.D.   On: 03/05/2021 08:59   CT Head Wo Contrast  Result Date: 03/25/2021 CLINICAL DATA:  Left-sided headache for 3  days EXAM: CT HEAD WITHOUT CONTRAST TECHNIQUE: Contiguous axial images were obtained from the base of the skull through the vertex without intravenous contrast. COMPARISON:  Ten days ago FINDINGS: Brain: Extensive vasogenic type edema in the left cerebral hemisphere with local mass effect. Suspect a lesion at the left occipital lobe where there faint crescentic isodense edema crossing the low-density white matter, likely the margin of the mass. No evidence of superimposed infarction or hemorrhage. No hydrocephalus. Vascular: No hyperdense vessel or unexpected calcification. Skull: Normal. Negative for fracture or focal lesion. Sinuses/Orbits: No acute finding. IMPRESSION: Continued extensive vasogenic edema in the left cerebrum with probable underlying left occipital lobe mass. Metastatic disease is most likely given the recent chest CT findings. Electronically Signed   By: Jorje Guild M.D.   On: 03/25/2021 06:53   CT Head Wo Contrast  Result Date: 03/15/2021 CLINICAL DATA:  Headache. COVID 2 weeks ago. History of lung cancer. EXAM: CT HEAD WITHOUT CONTRAST TECHNIQUE: Contiguous axial images were obtained from the base of the skull through the vertex without intravenous contrast. COMPARISON:  CT head 04/23/2012.  MRI brain 01/17/2020. FINDINGS: Brain: There is white matter hypodensity throughout the left frontal parietal and temporal lobes with preservation of gray-white matter distinction concerning for vasogenic edema. There is associated sulcal effacement and mass effect with 6 mm of midline shift to the right. There is mild compression of the left lateral ventricle. The ventricles are normal in size. There is no acute intracranial hemorrhage or extra-axial fluid collection. Vascular: No hyperdense vessel or unexpected calcification. Skull: Normal. Negative for fracture or focal lesion. Sinuses/Orbits: No acute finding. Other: None. IMPRESSION: 1. Large area of hypodensity in the left frontal, parietal  and temporal lobe worrisome for vasogenic edema. There is mass effect with 6 mm of midline shift to the right. Recommend further evaluation with MRI with and without contrast to evaluate for metastatic disease or other underlying brain lesion. 2. No acute intracranial hemorrhage. Electronically Signed   By: Ronney Asters M.D.   On: 03/15/2021 19:42   CT Chest W Contrast  Result Date: 03/15/2021 CLINICAL DATA:  History of recent COVID diagnosis 2 weeks ago with increased weakness EXAM: CT CHEST WITH CONTRAST TECHNIQUE: Multidetector CT imaging of the chest was performed during intravenous contrast administration. CONTRAST:  48mL OMNIPAQUE IOHEXOL 300 MG/ML  SOLN COMPARISON:  Chest x-ray from earlier in the same day, CT from 12/13/2019. FINDINGS: Cardiovascular: Thoracic aorta shows no aneurysmal dilatation or dissection. Pulmonary artery as visualized is within normal limits. Mild coronary calcifications are noted. No cardiac enlargement is seen. Mediastinum/Nodes: The thyroid gland demonstrates mild heterogeneity with a 17 mm bilobed partially calcified nodule within the right lobe of the thyroid. The overall appearance has decreased in the interval from a prior exam from 2021. Scattered small mediastinal lymph nodes are noted which appears stable when compared with the prior exam. The esophagus as visualized is within normal limits. Prominent left hilar lymph node is noted along  the anterior aspect of the left pulmonary artery measuring 12 mm but previously shown to be hypermetabolic on prior PET-CT. Lungs/Pleura: Lungs are well aerated bilaterally. There is again a mass lesion identified in the left hilum/infrahilar region which measures 4.4 x 3.3 cm in greatest dimension. This is similar in appearance to that seen on prior PET-CT from 2021 and again suspicious for primary pulmonary neoplasm. Some mild patchy reticulonodular changes are noted in the left lower lobe likely related to some postobstructive  change. These changes are new from the prior PET-CT. No other focal nodule is seen. Upper Abdomen: Mild heterogeneity of the liver is noted without focal mass. Gallbladder is within normal limits. The remainder of the upper abdomen is unremarkable. Musculoskeletal: Degenerative changes of the thoracic spine are seen. No acute rib abnormality is noted. No compression deformities are noted. IMPRESSION: Left hilar/infrahilar mass lesion measuring 4.4 x 3.3 cm. 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 Associated hilar and mediastinal lymph nodes are noted. Correlate with the clinical history as to any prior workup following previous PET-CT in 2021. Some new postobstructive inflammatory changes noted in the left lower lobe when compared with the prior PET-CT. Focal 17 mm nodule in the right lobe of the thyroid. This is decreased in size when compared with prior CT of the chest from 2021. Recommend nonemergent thyroid US (ref: J Am Coll Radiol. 2015 Feb;12(2): 143-50). Heterogeneity of the liver without focal mass. Electronically Signed   By: Inez Catalina M.D.   On: 03/15/2021 19:54   MR BRAIN W WO CONTRAST  Result Date: 03/25/2021 CLINICAL DATA:  Primary bronchogenic carcinoma. Metastatic disease evaluation. EXAM: MRI HEAD WITHOUT AND WITH CONTRAST TECHNIQUE: Multiplanar, multiecho pulse sequences of the brain and surrounding structures were obtained without and with intravenous contrast. CONTRAST:  22mL GADAVIST GADOBUTROL 1 MMOL/ML IV SOLN COMPARISON:  10/17/2018 FINDINGS: Brain: No acute infarct, mass effect or extra-axial collection. No acute or chronic hemorrhage. Peripherally contrast-enhancing lesion of the left occipital lobe measuring 2.2 x 2.0 x 2.6 cm. There is a large amount of surrounding edema. There are no other contrast-enhancing lesions. The midline structures are normal. There is 8 mm of rightward midline shift. Vascular: Major flow voids are preserved. Skull and upper cervical  spine: Normal calvarium and skull base. Visualized upper cervical spine and soft tissues are normal. Sinuses/Orbits:No paranasal sinus fluid levels or advanced mucosal thickening. No mastoid or middle ear effusion. Normal orbits. Tornwaldt cyst incidentally noted. IMPRESSION: 1. Peripherally contrast-enhancing lesion of the left occipital lobe with large amount of surrounding edema. This could be a solitary metastasis or a primary brain neoplasm. 2. Rightward midline shift of 8 mm. Electronically Signed   By: Ulyses Jarred M.D.   On: 03/25/2021 21:45   DG Chest Port 1 View  Result Date: 03/15/2021 CLINICAL DATA:  Cough and weakness. EXAM: PORTABLE CHEST 1 VIEW COMPARISON:  Chest x-ray 04/23/2012. FINDINGS: There are minimal strandy opacities in the left lung base favored is atelectasis. The lungs are otherwise clear. There is no pleural effusion or pneumothorax. The cardiomediastinal silhouette is within normal limits. No acute fractures. IMPRESSION: 1. Left basilar atelectasis. Electronically Signed   By: Ronney Asters M.D.   On: 03/15/2021 16:45    Labs: BNP (last 3 results) No results for input(s): BNP in the last 8760 hours. Basic Metabolic Panel: Recent Labs  Lab 03/25/21 0709 03/26/21 0649  NA 137 139  K 3.9 4.1  CL 106 106  CO2 26 26  GLUCOSE 127* 163*  BUN 8 11  CREATININE 0.62 0.64  CALCIUM 8.4* 8.6*  MG  --  2.3   Liver Function Tests: Recent Labs  Lab 03/25/21 0709  AST 24  ALT 39  ALKPHOS 70  BILITOT 0.5  PROT 6.1*  ALBUMIN 2.9*   No results for input(s): LIPASE, AMYLASE in the last 168 hours. No results for input(s): AMMONIA in the last 168 hours. CBC: Recent Labs  Lab 03/25/21 0709 03/26/21 0649 03/27/21 0610 03/28/21 0827  WBC 12.3* 19.2* 23.4* 19.8*  NEUTROABS 6.8  --  20.4*  --   HGB 11.5* 11.8* 11.0* 11.8*  HCT 35.2* 35.3* 33.6* 36.6  MCV 88.9 86.9 87.0 89.1  PLT 413* 425* 413* 444*   Cardiac Enzymes: No results for input(s): CKTOTAL, CKMB,  CKMBINDEX, TROPONINI in the last 168 hours. BNP: Invalid input(s): POCBNP CBG: No results for input(s): GLUCAP in the last 168 hours. D-Dimer No results for input(s): DDIMER in the last 72 hours. Hgb A1c No results for input(s): HGBA1C in the last 72 hours. Lipid Profile No results for input(s): CHOL, HDL, LDLCALC, TRIG, CHOLHDL, LDLDIRECT in the last 72 hours. Thyroid function studies No results for input(s): TSH, T4TOTAL, T3FREE, THYROIDAB in the last 72 hours.  Invalid input(s): FREET3 Anemia work up No results for input(s): VITAMINB12, FOLATE, FERRITIN, TIBC, IRON, RETICCTPCT in the last 72 hours. Urinalysis    Component Value Date/Time   COLORURINE YELLOW 03/15/2021 1633   APPEARANCEUR HAZY (A) 03/15/2021 1633   LABSPEC 1.013 03/15/2021 1633   PHURINE 6.0 03/15/2021 1633   GLUCOSEU NEGATIVE 03/15/2021 1633   HGBUR SMALL (A) 03/15/2021 1633   BILIRUBINUR NEGATIVE 03/15/2021 1633   KETONESUR 5 (A) 03/15/2021 1633   PROTEINUR NEGATIVE 03/15/2021 1633   NITRITE NEGATIVE 03/15/2021 1633   LEUKOCYTESUR NEGATIVE 03/15/2021 1633   Sepsis Labs Invalid input(s): PROCALCITONIN,  WBC,  LACTICIDVEN Microbiology Recent Results (from the past 240 hour(s))  Resp Panel by RT-PCR (Flu A&B, Covid) Nasopharyngeal Swab     Status: None   Collection Time: 03/25/21 10:44 AM   Specimen: Nasopharyngeal Swab; Nasopharyngeal(NP) swabs in vial transport medium  Result Value Ref Range Status   SARS Coronavirus 2 by RT PCR NEGATIVE NEGATIVE Final    Comment: (NOTE) SARS-CoV-2 target nucleic acids are NOT DETECTED.  The SARS-CoV-2 RNA is generally detectable in upper respiratory specimens during the acute phase of infection. The lowest concentration of SARS-CoV-2 viral copies this assay can detect is 138 copies/mL. A negative result does not preclude SARS-Cov-2 infection and should not be used as the sole basis for treatment or other patient management decisions. A negative result may occur  with  improper specimen collection/handling, submission of specimen other than nasopharyngeal swab, presence of viral mutation(s) within the areas targeted by this assay, and inadequate number of viral copies(<138 copies/mL). A negative result must be combined with clinical observations, patient history, and epidemiological information. The expected result is Negative.  Fact Sheet for Patients:  EntrepreneurPulse.com.au  Fact Sheet for Healthcare Providers:  IncredibleEmployment.be  This test is no t yet approved or cleared by the [place] FDA and  has been authorized for detection and/or diagnosis of SARS-CoV-2 by FDA under an Emergency Use Authorization (EUA). This EUA will remain  in effect (meaning this test can be used) for the duration of the COVID-19 declaration under Section 564(b)(1) of the Act, 21 U.S.C.section 360bbb-3(b)(1), unless the authorization is terminated  or revoked sooner.       Influenza A  by PCR NEGATIVE NEGATIVE Final   Influenza B by PCR NEGATIVE NEGATIVE Final    Comment: (NOTE) The Xpert Xpress SARS-CoV-2/FLU/RSV plus assay is intended as an aid in the diagnosis of influenza from Nasopharyngeal swab specimens and should not be used as a sole basis for treatment. Nasal washings and aspirates are unacceptable for Xpert Xpress SARS-CoV-2/FLU/RSV testing.  Fact Sheet for Patients: EntrepreneurPulse.com.au  Fact Sheet for Healthcare Providers: IncredibleEmployment.be  This test is not yet approved or cleared by the [place] FDA and has been authorized for detection and/or diagnosis of SARS-CoV-2 by FDA under an Emergency Use Authorization (EUA). This EUA will remain in effect (meaning this test can be used) for the duration of the COVID-19 declaration under Section 564(b)(1) of the Act, 21 U.S.C. section 360bbb-3(b)(1), unless the authorization is terminated  or revoked.  Performed at [facility], [address], [place], [place] [address]      Time coordinating discharge: 25 minutes  SIGNED: Antonieta Pert, MD  [facility] 03/28/2021, 11:56 AM  If 7PM-7AM, please contact night-coverage [email or web address]

## 2021-03-31 DIAGNOSIS — C3432 Malignant neoplasm of lower lobe, left bronchus or lung: Secondary | ICD-10-CM | POA: Diagnosis not present

## 2021-03-31 DIAGNOSIS — C7931 Secondary malignant neoplasm of brain: Secondary | ICD-10-CM | POA: Diagnosis not present

## 2021-04-02 ENCOUNTER — Ambulatory Visit: Payer: Medicaid Other | Admitting: Radiation Oncology

## 2021-04-02 ENCOUNTER — Other Ambulatory Visit: Payer: Self-pay | Admitting: *Deleted

## 2021-04-02 LAB — CYTOLOGY - NON PAP

## 2021-04-02 NOTE — Progress Notes (Signed)
The proposed treatment discussed in conference is for discussion purpose only and is not a binding recommendation. The patient was not been physically examined, or presented with their treatment options. Therefore, final treatment plans cannot be decided.  

## 2021-04-03 ENCOUNTER — Ambulatory Visit
Admission: RE | Admit: 2021-04-03 | Discharge: 2021-04-03 | Disposition: A | Discharge status: Home / Self Care | Payer: Medicaid Other | Source: Ambulatory Visit | Attending: Radiation Oncology | Admitting: Radiation Oncology

## 2021-04-03 ENCOUNTER — Other Ambulatory Visit: Payer: Self-pay

## 2021-04-03 VITALS — BP 131/79 | HR 67 | Temp 96.8°F | Resp 18

## 2021-04-03 DIAGNOSIS — C7931 Secondary malignant neoplasm of brain: Secondary | ICD-10-CM | POA: Diagnosis not present

## 2021-04-03 DIAGNOSIS — C3432 Malignant neoplasm of lower lobe, left bronchus or lung: Secondary | ICD-10-CM

## 2021-04-03 NOTE — Progress Notes (Signed)
Patient to nursing for 15 minute observation is Sandra Horne.  Denies headache, blurred vision, or any other side effects.  Reports felt really good and thanks everyone for the care provided to her.

## 2021-04-03 NOTE — Progress Notes (Signed)
Per communication from Dr. Reatha Armour, the recommendation is to proceed with SRS alone, no surgery. We will proceed with scheduling a 3 fraction course of SRS to begin in the near future.  Nicholos Johns, MMS, PA-C [facility]: [phone]   Fax: [phone]

## 2021-04-04 NOTE — Addendum Note (Signed)
Encounter addended by: Tyler Pita, MD on: 04/04/2021 2:26 PM  Actions taken: Medication List reviewed, Problem List reviewed, Allergies reviewed, Clinical Note Signed

## 2021-04-04 NOTE — Progress Notes (Signed)
°  Radiation Oncology         [phone] ________________________________  Stereotactic Treatment Procedure Note  Name: Sandra Horne MRN: [id number]  Date: 04/03/2021  DOB: 24-Jun-1955  SPECIAL TREATMENT PROCEDURE    ICD-10-CM   1. Primary cancer of left lower lobe of lung (HCC)  C34.32     2. Brain metastasis ([place])  C79.31       3D TREATMENT PLANNING AND DOSIMETRY:  The patient's radiation plan was reviewed and approved by neurosurgery and radiation oncology prior to treatment.  It showed 3-dimensional radiation distributions overlaid onto the planning CT/MRI image set.  [facility] for the target structures as well as the organs at risk were reviewed. The documentation of the 3D plan and dosimetry are filed in the radiation oncology EMR.  NARRATIVE:  Sandra Horne was brought to the TrueBeam stereotactic radiation treatment machine and placed supine on the CT couch. The head frame was applied, and the patient was set up for stereotactic radiosurgery.  Neurosurgery was present for the set-up and delivery  SIMULATION VERIFICATION:  In the couch zero-angle position, the patient underwent Exactrac imaging using the Brainlab system with orthogonal KV images.  These were carefully aligned and repeated to confirm treatment position for each of the isocenters.  The Exactrac snap film verification was repeated at each couch angle.  PROCEDURE: Sandra Horne received stereotactic radiosurgery to the following targets: Left Occipital 26 mm target was treated using 6 Rapid Arc VMAT Beams to a fractional prescription dose of 9 Gy to be repeated a total of 3 times to achieve 27 Gy in 3 fractions.  ExacTrac registration was performed for each couch angle.  The 100% isodose line was prescribed.  6 MV X-rays were delivered in the flattening filter free beam mode.  STEREOTACTIC TREATMENT MANAGEMENT:  Following delivery, the patient was transported to nursing in stable condition and monitored for  possible acute effects.  Vital signs were recorded BP 131/79 (BP Location: Left Arm, Patient Position: Sitting, Cuff Size: Normal)    Pulse 67    Temp (!) 96.8 F (36 C) (Oral)    Resp 18    SpO2 99% . The patient tolerated treatment without significant acute effects, and was discharged to home in stable condition.    PLAN: Complete 3 fractions and then follow-up in one month.  ________________________________  Sheral Apley. Tammi Klippel, M.D.

## 2021-04-06 ENCOUNTER — Other Ambulatory Visit: Payer: Self-pay

## 2021-04-06 ENCOUNTER — Ambulatory Visit
Admission: RE | Admit: 2021-04-06 | Discharge: 2021-04-06 | Disposition: A | Discharge status: Home / Self Care | Payer: Medicaid Other | Source: Ambulatory Visit | Attending: Radiation Oncology | Admitting: Radiation Oncology

## 2021-04-06 DIAGNOSIS — C7931 Secondary malignant neoplasm of brain: Secondary | ICD-10-CM | POA: Diagnosis not present

## 2021-04-06 DIAGNOSIS — C3432 Malignant neoplasm of lower lobe, left bronchus or lung: Secondary | ICD-10-CM | POA: Diagnosis not present

## 2021-04-06 NOTE — Progress Notes (Signed)
Nurse monitoring complete status post 2 of 3 SRS treatments. Patient without complaints. Patient denies new or worsening neurologic symptoms. Vitals stable. Instructed patient to avoid strenuous activity for the next 24 hours. Instructed patient to not miss any of her decadron doses. Advised patient to call [phone] with needs related to treatment after hours. Patient verbalized understanding and agreement. Ambulated out of clinic unassisted without incident  Vitals:   04/06/21 1155  BP: 111/72  Pulse: 73  Resp: 18  Temp: (!) 96.8 F (36 C)  SpO2: 100%

## 2021-04-08 ENCOUNTER — Ambulatory Visit
Admission: RE | Admit: 2021-04-08 | Discharge: 2021-04-08 | Disposition: A | Discharge status: Home / Self Care | Payer: Medicaid Other | Source: Ambulatory Visit | Attending: Radiation Oncology | Admitting: Radiation Oncology

## 2021-04-08 ENCOUNTER — Other Ambulatory Visit: Payer: Self-pay

## 2021-04-08 ENCOUNTER — Encounter: Payer: Self-pay | Admitting: Radiation Oncology

## 2021-04-08 DIAGNOSIS — C7931 Secondary malignant neoplasm of brain: Secondary | ICD-10-CM | POA: Diagnosis not present

## 2021-04-08 DIAGNOSIS — C3432 Malignant neoplasm of lower lobe, left bronchus or lung: Secondary | ICD-10-CM | POA: Diagnosis not present

## 2021-04-08 NOTE — Progress Notes (Signed)
Patient to nursing status post 3/3 SRS treatments.  Observation for 15 minutes.  Denies pain (0/10 scale) blurred vision, or headache.  Gait steady and was able to ambulate without assistance out of clinic.  Advised patient if she should have any concern to call.  Verbalized her appreciation and was satisfied with being finished with treatment.  Nothing else follows.

## 2021-04-09 ENCOUNTER — Telehealth: Payer: Self-pay | Admitting: Radiation Therapy

## 2021-04-09 NOTE — Op Note (Signed)
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 Tammi Klippel, MD  TECHNIQUE:  The patient underwent a radiation treatment planning session in the radiation oncology simulation suite under the care of the radiation oncology physician and physicist.  I participated closely in the radiation treatment planning afterwards. The patient underwent planning CT which was fused to 3T high resolution MRI with 1 mm axial slices.  These images were fused on the planning system.  We contoured the gross target volumes and subsequently expanded this to yield the Planning Target Volume. I actively participated in the planning process.  I helped to define and review the target contours and also the contours of the optic pathway, eyes, brainstem and selected nearby organs at risk.  All the dose constraints for critical structures were reviewed and compared to AAPM Task Group 101.  The prescription dose conformity was reviewed.  I approved the plan electronically.    Accordingly, Sandra Horne  was brought to the TrueBeam stereotactic radiation treatment linac and placed in the custom immobilization mask.  The patient was aligned according to the IR fiducial markers with BrainLab Exactrac, then orthogonal x-rays were used in ExacTrac with the 6DOF robotic table and the shifts were made to align the patient.  Sandra Horne received stereotactic radiosurgery to a prescription dose of 9 Gy uneventfully, during her first fraction.  She is scheduled to undergo 3 fractions.  The detailed description of the procedure is recorded in the radiation oncology procedure note.  I was present for the duration of the  procedure.  DISPOSITION:   Following delivery, the patient was transported to nursing in stable condition and monitored for possible acute effects to be discharged to home in stable condition.  Elwin Sleight, [facility]

## 2021-04-09 NOTE — Telephone Encounter (Signed)
Called Sandra Horne to check in, she is feeling great. No issues or complaints since the completion of her SRS treatment.   I also shared the steroid taper instructions which she read back with understanding.   Today- continue current dose, 4 mg BID.  Starting tomorrow, 1/13, take 4 mg in the morning and (half of the pill) 2mg  in the evening, continue for 7 days.  Then take 2 mg BID for 7 days  Then 2 mg in the morning only for 7 days Then stop.    Mont Dutton R.T.(R)(T) Radiation Special Procedures Navigator

## 2021-04-09 NOTE — Consult Note (Signed)
° °  Providing Compassionate, Quality Care - Together  Neurosurgery Consult  Referring physician: Dr. Tammi Klippel Reason for referral: Metastatic brain cancer  Chief Complaint: Headache, confusion  History of Present Illness: This is a 66 year old female, right-handed, with a history of left infrahilar lung mass in September 2021 but lost to follow-up, and complaining of headache.  This has been ongoing for approximately 4 weeks.  CT/MRI imaging of the brain revealed a left peripheral enhancing occipital lobe lesion with surrounding vasogenic edema consistent with metastatic disease.  She underwent a lung biopsy was consistent with non-small cell lung cancer.  She denies seizure activity, focal weakness numbness or tingling.   Medications: I have reviewed the patient's current medications. Allergies: No Known Allergies  History reviewed. No pertinent family history. Social History:  has no history on file for tobacco use, alcohol use, and drug use.  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  She was placed on Decadron due to her cerebral edema and undergoing treatment.  We discussed all risks, benefits and expected outcomes and she agrees to proceed with [facility]  treatment.    Thank you for allowing me to participate in this patient's care.  Please do not hesitate to call with questions or concerns.   Elwin Sleight, [facility] Cell: [phone]

## 2021-04-09 NOTE — Addendum Note (Signed)
Encounter addended by: Karsten Ro, DO on: 04/09/2021 9:53 AM  Actions taken: Clinical Note Signed

## 2021-04-14 ENCOUNTER — Inpatient Hospital Stay (HOSPITAL_COMMUNITY): Payer: MEDICAID | Admitting: Hematology

## 2021-04-25 ENCOUNTER — Encounter (HOSPITAL_COMMUNITY): Payer: Self-pay | Admitting: Emergency Medicine

## 2021-04-25 ENCOUNTER — Other Ambulatory Visit: Payer: Self-pay

## 2021-04-25 ENCOUNTER — Emergency Department (HOSPITAL_COMMUNITY)
Admission: EM | Admit: 2021-04-25 | Discharge: 2021-04-25 | Disposition: A | Discharge status: Home / Self Care | Payer: Medicaid Other | Attending: Emergency Medicine | Admitting: Emergency Medicine

## 2021-04-25 DIAGNOSIS — K0889 Other specified disorders of teeth and supporting structures: Secondary | ICD-10-CM | POA: Diagnosis present

## 2021-04-25 DIAGNOSIS — Z85841 Personal history of malignant neoplasm of brain: Secondary | ICD-10-CM | POA: Insufficient documentation

## 2021-04-25 DIAGNOSIS — K029 Dental caries, unspecified: Secondary | ICD-10-CM | POA: Insufficient documentation

## 2021-04-25 MED ORDER — NAPROXEN 375 MG PO TABS: 375.0000 mg | ORAL_TABLET | Freq: Two times a day (BID) | ORAL | 0 refills | Status: DC

## 2021-04-25 MED ORDER — PENICILLIN V POTASSIUM 500 MG PO TABS: 500.0000 mg | ORAL_TABLET | Freq: Four times a day (QID) | ORAL | 0 refills | Status: AC

## 2021-04-25 NOTE — Discharge Instructions (Addendum)
Take the antibiotics and pain medications as prescribed.  Follow-up with a dentist as soon as possible

## 2021-04-25 NOTE — ED Provider Notes (Signed)
[facility] Provider Note   CSN: [id number] Arrival date & time: 04/25/21  1000     History  Chief Complaint  Patient presents with   Dental Pain    Sandra Horne is a 66 y.o. female.   Dental Pain Associated symptoms: no fever    Patient has a history of lung cancer.  Patient is receiving radiation treatments for metastatic brain lesions.  Patient presents ED with complaints of tooth ache.  Patient states she is having pain in her bilateral teeth in the upper maxillary region.  It hurts for her to bite.  Pain was more intense last evening.  This morning she felt like her face was swollen.  She denies having any fevers or chills.  No difficulty swallowing.  No sore throat.  She has not been able to see a dentist yet  Home Medications Prior to Admission medications   Medication Sig Start Date End Date Taking? Authorizing Provider  naproxen (NAPROSYN) 375 MG tablet Take 1 tablet (375 mg total) by mouth 2 (two) times daily. 04/25/21  Yes Dorie Rank, MD  penicillin v potassium (VEETID) 500 MG tablet Take 1 tablet (500 mg total) by mouth 4 (four) times daily for 7 days. 04/25/21 05/02/21 Yes Dorie Rank, MD  dexamethasone (DECADRON) 4 MG tablet Take 1 tablet (4 mg total) by mouth 2 (two) times daily with a meal. 03/28/21 04/27/21  Antonieta Pert, MD  polyethylene glycol (MIRALAX) 17 g packet Take 17 g by mouth daily. Patient not taking: Reported on 03/25/2021 03/05/21   Wynona Dove A, DO      Allergies    Patient has no known allergies.    Review of Systems   Review of Systems  Constitutional:  Negative for fever.   Physical Exam Updated Vital Signs BP (!) 147/68 (BP Location: Left Arm)    Pulse 87    Temp 98.3 F (36.8 C) (Oral)    Resp 18    Ht 1.626 m (5\' 4" )    Wt 62.5 kg    SpO2 100%    BMI 23.65 kg/m  Physical Exam Vitals and nursing note reviewed.  Constitutional:      General: She is not in acute distress.    Appearance: She is well-developed.  HENT:      Head: Normocephalic and atraumatic.     Comments: No facial swelling noted, bilateral dental caries, no purulent drainage in the oropharyngeal region, no obvious abscess    Right Ear: External ear normal.     Left Ear: External ear normal.  Eyes:     General: No scleral icterus.       Right eye: No discharge.        Left eye: No discharge.     Conjunctiva/sclera: Conjunctivae normal.  Neck:     Trachea: No tracheal deviation.  Cardiovascular:     Rate and Rhythm: Normal rate.  Pulmonary:     Effort: Pulmonary effort is normal. No respiratory distress.     Breath sounds: No stridor.  Abdominal:     General: There is no distension.  Musculoskeletal:        General: No swelling or deformity.     Cervical back: Neck supple.  Skin:    General: Skin is warm and dry.     Findings: No rash.  Neurological:     Mental Status: She is alert.     Cranial Nerves: Cranial nerve deficit: no gross deficits.    ED Results /  Procedures / Treatments   Labs (all labs ordered are listed, but only abnormal results are displayed) Labs Reviewed - No data to display  EKG None  Radiology No results found.  Procedures Procedures    Medications Ordered in ED Medications - No data to display  ED Course/ Medical Decision Making/ A&P                           Medical Decision Making Risk Prescription drug management.  Dental pain Patient presents with complaints of dental caries.  No signs of severe abscess or facial swelling on exam.  We will plan on treatment with antibiotics  History of metastatic cancer. No signs of any acute issues with that today.  Evaluation and diagnostic testing in the emergency department does not suggest an emergent condition requiring admission or immediate intervention beyond what has been performed at this time.  The patient is safe for discharge and has been instructed to return immediately for worsening symptoms, change in symptoms or any other  concerns.        Final Clinical Impression(s) / ED Diagnoses Final diagnoses:  Pain, dental  Dental caries    Rx / DC Orders ED Discharge Orders          Ordered    naproxen (NAPROSYN) 375 MG tablet  2 times daily        04/25/21 1026    penicillin v potassium (VEETID) 500 MG tablet  4 times daily        04/25/21 1026              Dorie Rank, MD 04/25/21 1029

## 2021-04-25 NOTE — ED Triage Notes (Signed)
Pt to the ED with dental pain and oral swelling for the past 2 days.

## 2021-05-05 ENCOUNTER — Encounter (HOSPITAL_COMMUNITY): Payer: Self-pay | Admitting: Pulmonary Disease

## 2021-05-12 ENCOUNTER — Telehealth: Payer: Self-pay

## 2021-05-12 NOTE — Telephone Encounter (Signed)
Called patient & spouse x2. Attempted a voicemail reminder of patient's 9:30am-05/13/21 telephone appointment w/ [name] Bruning PA-C. No answer & no voicemail option on the numbers provided. I will attempt another call on 05/13/21 around 9:00am.

## 2021-05-13 ENCOUNTER — Telehealth: Payer: Self-pay

## 2021-05-13 ENCOUNTER — Ambulatory Visit
Admission: RE | Admit: 2021-05-13 | Discharge: 2021-05-13 | Disposition: A | Discharge status: Home / Self Care | Payer: MEDICAID | Source: Ambulatory Visit | Attending: Urology | Admitting: Urology

## 2021-05-13 ENCOUNTER — Telehealth: Payer: Self-pay | Admitting: Urology

## 2021-05-13 DIAGNOSIS — C3490 Malignant neoplasm of unspecified part of unspecified bronchus or lung: Secondary | ICD-10-CM

## 2021-05-13 DIAGNOSIS — C7931 Secondary malignant neoplasm of brain: Secondary | ICD-10-CM

## 2021-05-13 NOTE — Telephone Encounter (Signed)
I made multiple attempts to reach the patient to conduct her routine scheduled 1 month follow-up visit but was unable to get in touch with her and her voicemail was full so I could not leave her a message.  I have asked Mont Dutton, RT-RT, to continue to try and reach her to see how she is doing, now that she is 1 month out from completing the fractionated SRS brain treatments and to see if she has any questions or concerns.  If she is doing well, we will just plan to coordinate for a repeat MRI brain scan in April 2023, to assess her treatment response and I will plan to follow-up with her by phone thereafter to review results and recommendations from the multidisciplinary brain conference.  Nicholos Johns, MMS, PA-C [place] at [place]: [phone]   Fax: [phone]

## 2021-05-13 NOTE — Telephone Encounter (Signed)
Called patient & spouse x2. Left a voicemail reminder of patient's 9:30am-05/13/21 telephone appointment w/ [name] Bruning PA-C. No answer & no voicemail option for the numbers provided. I left my extension [phone] and requested that patient return my call as soon as possible so that we may complete the nursing portion of this appointment.

## 2021-05-13 NOTE — Progress Notes (Signed)
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  The patient was notified in advance and gave permission to proceed with this visit format. Unfortunately, after multiple attempts, I was unable to reach her and unable to leave a message since her VM was full.  She tolerated the [facility] treatment very well, without any ill side effects.                              ALLERGIES:  has No Known Allergies.  Meds: Current Outpatient Medications  Medication Sig Dispense Refill   naproxen (NAPROSYN) 375 MG tablet Take 1 tablet (375 mg total) by mouth 2 (two) times daily. 20 tablet 0   polyethylene glycol (MIRALAX) 17 g packet Take 17 g by mouth daily. (Patient not taking: Reported on 03/25/2021) 14 each 0   No current facility-administered medications for this encounter.    Physical Findings:  vitals were not taken for this visit.   /10 Unable to assess due to telephone follow up visit format.  Lab Findings: Lab Results  Component Value Date   WBC 19.8 (H) 03/28/2021   HGB 11.8 (L) 03/28/2021   HCT 36.6 03/28/2021   MCV 89.1 03/28/2021   PLT 444 (H) 03/28/2021     Radiographic Findings: No results found.  Impression/Plan: 65.  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 I was unable to reach  the patient but have requested that Mont Dutton continue to try to reach her to see how she is doing and if she has any questions or concerns regarding her treatment. She will also share the plan to obtain a post-treatment MRI brain in April 2023 to assess treatment response and pending this scan is stable, we will proceed with serial MRI brain scans every 3 months going forward to continue to monitor for any disease progression or recurrence.    Nicholos Johns, PA-C

## 2021-05-14 ENCOUNTER — Other Ambulatory Visit: Payer: Self-pay | Admitting: Radiation Therapy

## 2021-05-14 ENCOUNTER — Encounter: Payer: Self-pay | Admitting: *Deleted

## 2021-05-14 ENCOUNTER — Telehealth: Payer: Self-pay | Admitting: Radiation Therapy

## 2021-05-14 DIAGNOSIS — C7931 Secondary malignant neoplasm of brain: Secondary | ICD-10-CM

## 2021-05-14 NOTE — Telephone Encounter (Signed)
Sandra Horne was returning a call from [facility] for her 1 month follow-up and was transferred to me instead. She has been doing well from a brain standpoint. Almost complete with her steroid taper, no new or worsening CNS symptoms or evidence of thrush. She has had an infected tooth that has caused pain and swelling in her jaw. She was seen in the ED recently for this and given an antibiotic and anti-inflammatory. She has not been taking the antibiotic because she didn't know if she could take it at the same time as the steroids. I have advised her to continue with the antibiotic as written and complete the entire bottle to treat the infection.   Sandra Horne has missed multiple appointments at Dr. Tomie China office, they are unable to reschedule her at this time. She understands that it is important to continue to follow her cancer and treat as necessary. She is interested in setting up an appointment with a provider here at [facility]. I have sent a message to the lung navigator, Norton Blizzard RN and new patient coordinator, Karie Mainland, regarding this.   While we were on the phone I also informed her of the April brain MRI and follow-up appointments we have set up for her, but will still mail out a reminder card including my contact information for her to reach out if needed.   Mont Dutton R.T.(R)(T) Radiation Special Procedures Navigator

## 2021-05-15 ENCOUNTER — Encounter: Payer: Self-pay | Admitting: *Deleted

## 2021-05-15 DIAGNOSIS — C3490 Malignant neoplasm of unspecified part of unspecified bronchus or lung: Secondary | ICD-10-CM

## 2021-05-15 DIAGNOSIS — C7931 Secondary malignant neoplasm of brain: Secondary | ICD-10-CM

## 2021-05-15 NOTE — Progress Notes (Signed)
Oncology Nurse Navigator Documentation  Oncology Nurse Navigator Flowsheets 05/15/2021  Navigator Follow Up Date: 06/02/2021  Navigator Follow Up Reason: New Patient Appointment  Navigator Location [place] Encounter Type Telephone  Telephone Outgoing Call  Barriers/Navigation Needs Coordination of Care/per rad onc, patient would like to be seen in [place] for med onc. I called and scheduled patient.   Interventions Coordination of Care;Education  Acuity Level 2-Minimal Needs (1-2 Barriers Identified)  Coordination of Care Appts  Education Method Verbal  Time Spent with Patient 30

## 2021-05-18 ENCOUNTER — Encounter: Payer: Self-pay | Admitting: *Deleted

## 2021-05-18 NOTE — Progress Notes (Signed)
Oncology Nurse Navigator Documentation  Oncology Nurse Navigator Flowsheets 05/18/2021 05/15/2021  Navigator Follow Up Date: - 06/02/2021  Navigator Follow Up Reason: - New Patient Appointment  Navigator Location [place] Encounter Type Telephone Telephone  Telephone Outgoing Call Outgoing Call  Patient Visit Type Other -  Barriers/Navigation Needs Coordination of Care Coordination of Care  Interventions Coordination of Care/I called [name] today to update her on the appt with Dr. Julien Nordmann. I was unable to reach nor leave vm message.  Coordination of Care;Education  Acuity Level 2-Minimal Needs (1-2 Barriers Identified) Level 2-Minimal Needs (1-2 Barriers Identified)  Coordination of Care Appts Appts  Education Method - Verbal  Time Spent with Patient 15 30

## 2021-05-20 NOTE — Progress Notes (Signed)
°  Radiation Oncology         [phone] ________________________________  Name: Sandra Horne MRN: [id number]  Date: 04/08/2021  DOB: 07/31/1955  End of Treatment Note  Diagnosis:   66 yo woman with a solitary 2.6 cm Left Parietal Brain Metastasis from non-small cell cancer of the left lower lobe of the lung     Indication for treatment:  Palliation       Radiation treatment dates:   1/6-1/22/23  Site/dose/beams/energy:   [name] received stereotactic radiosurgery to the following target: Left Occipital 26 mm target was treated using 6 Rapid Arc VMAT Beams to a fractional prescription dose of 9 Gy to be repeated a total of 3 times to achieve 27 Gy in 3 fractions.  ExacTrac registration was performed for each couch angle.  The 100% isodose line was prescribed.  6 MV X-rays were delivered in the flattening filter free beam mode.  Narrative: The patient tolerated radiation treatment relatively well.     Plan: The patient has completed radiation treatment. The patient will return to radiation oncology clinic for routine followup in one month. I advised her to call or return sooner if she has any questions or concerns related to her recovery or treatment. ________________________________  Sheral Apley. Tammi Klippel, M.D.

## 2021-05-25 ENCOUNTER — Encounter: Payer: Self-pay | Admitting: *Deleted

## 2021-05-25 NOTE — Progress Notes (Signed)
Oncology Nurse Navigator Documentation  Oncology Nurse Navigator Flowsheets 05/25/2021 05/18/2021 05/15/2021  Navigator Follow Up Date: - - 06/02/2021  Navigator Follow Up Reason: - - New Patient Appointment  Navigator Location [place]  Navigator Encounter Type Telephone Telephone Telephone  Telephone Outgoing Call/I called to update [name] about her appt to see Dr. Julien Nordmann next week. I was again unable to reach nor leave a vm message.  Outgoing Call [place] Call  Patient Visit Type - Other -  Barriers/Navigation Needs Coordination of Care Coordination of Care Coordination of Care  Interventions Coordination of Care Coordination of Care Coordination of Care;Education  Acuity Level 2-Minimal Needs (1-2 Barriers Identified) Level 2-Minimal Needs (1-2 Barriers Identified) Level 2-Minimal Needs (1-2 Barriers Identified)  Coordination of Care Appts Appts Appts  Education Method Verbal - Verbal  Time Spent with Patient 15 15 30

## 2021-05-29 ENCOUNTER — Encounter: Payer: Self-pay | Admitting: *Deleted

## 2021-05-29 NOTE — Progress Notes (Signed)
Oncology Nurse Navigator Documentation ? ?Oncology Nurse Navigator Flowsheets 05/29/2021 05/25/2021 05/18/2021 05/15/2021  ?Navigator Follow Up Date: - - - 06/02/2021  ?Navigator Follow Up Reason: - - - New Patient Appointment  ?Navigator Location [facility]  ?Navigator Encounter Type Telephone Telephone Telephone Telephone  ?Telephone Outgoing Call/I called [name] today to update her on the appt with Dr. Julien Nordmann on 3/7. I was unable to reach or leave a vm message.   Outgoing Call [place] Call [place] Call  ?Patient Visit Type - - Other -  ?Barriers/Navigation Needs Coordination of Care Coordination of Care Coordination of Care Coordination of Care  ?Interventions Coordination of Care Coordination of Care Coordination of Care Coordination of Care;Education  ?Acuity Level 2-Minimal Needs (1-2 Barriers Identified) Level 2-Minimal Needs (1-2 Barriers Identified) Level 2-Minimal Needs (1-2 Barriers Identified) Level 2-Minimal Needs (1-2 Barriers Identified)  ?Coordination of Care - Appts Appts Appts  ?Education Method - Verbal - Verbal  ?Time Spent with Patient 15 15 15 30   ?  ?

## 2021-06-02 ENCOUNTER — Inpatient Hospital Stay: Payer: Medicaid Other | Attending: Internal Medicine | Admitting: Internal Medicine

## 2021-06-02 ENCOUNTER — Encounter: Payer: Self-pay | Admitting: Internal Medicine

## 2021-06-02 ENCOUNTER — Other Ambulatory Visit: Payer: Self-pay

## 2021-06-02 ENCOUNTER — Encounter: Payer: Self-pay | Admitting: *Deleted

## 2021-06-02 ENCOUNTER — Inpatient Hospital Stay: Payer: Medicaid Other

## 2021-06-02 VITALS — BP 134/78 | HR 97 | Temp 99.1°F | Resp 18 | Wt 152.3 lb

## 2021-06-02 DIAGNOSIS — C3492 Malignant neoplasm of unspecified part of left bronchus or lung: Secondary | ICD-10-CM | POA: Diagnosis present

## 2021-06-02 DIAGNOSIS — C3490 Malignant neoplasm of unspecified part of unspecified bronchus or lung: Secondary | ICD-10-CM

## 2021-06-02 DIAGNOSIS — C7931 Secondary malignant neoplasm of brain: Secondary | ICD-10-CM | POA: Diagnosis present

## 2021-06-02 DIAGNOSIS — F17210 Nicotine dependence, cigarettes, uncomplicated: Secondary | ICD-10-CM | POA: Insufficient documentation

## 2021-06-02 LAB — CBC WITH DIFFERENTIAL (CANCER CENTER ONLY)
Abs Immature Granulocytes: 0.09 10*3/uL — ABNORMAL HIGH (ref 0.00–0.07)
Basophils Absolute: 0.1 10*3/uL (ref 0.0–0.1)
Basophils Relative: 1 %
Eosinophils Absolute: 0.2 10*3/uL (ref 0.0–0.5)
Eosinophils Relative: 3 %
HCT: 31.3 % — ABNORMAL LOW (ref 36.0–46.0)
Hemoglobin: 10.4 g/dL — ABNORMAL LOW (ref 12.0–15.0)
Immature Granulocytes: 1 %
Lymphocytes Relative: 33 %
Lymphs Abs: 2.7 10*3/uL (ref 0.7–4.0)
MCH: 29.7 pg (ref 26.0–34.0)
MCHC: 33.2 g/dL (ref 30.0–36.0)
MCV: 89.4 fL (ref 80.0–100.0)
Monocytes Absolute: 1 10*3/uL (ref 0.1–1.0)
Monocytes Relative: 12 %
Neutro Abs: 4.2 10*3/uL (ref 1.7–7.7)
Neutrophils Relative %: 50 %
Platelet Count: 337 10*3/uL (ref 150–400)
RBC: 3.5 MIL/uL — ABNORMAL LOW (ref 3.87–5.11)
RDW: 17.2 % — ABNORMAL HIGH (ref 11.5–15.5)
WBC Count: 8.2 10*3/uL (ref 4.0–10.5)
nRBC: 0 % (ref 0.0–0.2)

## 2021-06-02 LAB — CMP (CANCER CENTER ONLY)
ALT: 25 U/L (ref 0–44)
AST: 17 U/L (ref 15–41)
Albumin: 3.5 g/dL (ref 3.5–5.0)
Alkaline Phosphatase: 92 U/L (ref 38–126)
Anion gap: 3 — ABNORMAL LOW (ref 5–15)
BUN: 5 mg/dL — ABNORMAL LOW (ref 8–23)
CO2: 29 mmol/L (ref 22–32)
Calcium: 8.7 mg/dL — ABNORMAL LOW (ref 8.9–10.3)
Chloride: 108 mmol/L (ref 98–111)
Creatinine: 0.68 mg/dL (ref 0.44–1.00)
GFR, Estimated: >60 mL/min (ref >60)
Glucose, Bld: 98 mg/dL (ref 70–99)
Potassium: 4.3 mmol/L (ref 3.5–5.1)
Sodium: 140 mmol/L (ref 135–145)
Total Bilirubin: 0.5 mg/dL (ref 0.3–1.2)
Total Protein: 6.6 g/dL (ref 6.5–8.1)

## 2021-06-02 MED ORDER — PROCHLORPERAZINE MALEATE 10 MG PO TABS: 10.0000 mg | ORAL_TABLET | Freq: Four times a day (QID) | ORAL | 0 refills | Status: DC | PRN

## 2021-06-02 NOTE — Progress Notes (Signed)
START OFF PATHWAY REGIMEN - Non-Small Cell Lung ? ? ?[id number]:Cemiplimab 350 mg IV D1 q21 Days: ?  A cycle is every 21 days: ?    Cemiplimab-rwlc  ? ?**Always confirm dose/schedule in your pharmacy ordering system** ? ?Patient Characteristics: ?Stage IV Metastatic, Nonsquamous, Molecular Analysis Completed, Molecular Alteration Present and Targeted Therapy Exhausted OR EGFR Exon 20+ or KRAS G12C+ or HER2+ Present and No Prior Chemo/Immunotherapy OR No Alteration Present, Initial  ?Chemotherapy/Immunotherapy, PS = 0, 1, BRAF/MET/KRAS/HER2 Mutation Positive, Candidate for Immunotherapy, PD-L1 Expression Positive  ? 50% (TPS) and Immunotherapy Candidate ?Therapeutic Status: Stage IV Metastatic ?Histology: Nonsquamous Cell ?Broad Molecular Profiling Status: Molecular Analysis Completed ?Molecular Analysis Results: KRAS G12C Mutation Present and No Prior Chemo/Immunotherapy ?ECOG Performance Status: 1 ?Chemotherapy/Immunotherapy Line of Therapy: Initial Chemotherapy/Immunotherapy ?Immunotherapy Candidate Status: Candidate for Immunotherapy ?PD-L1 Expression Status: PD-L1 Positive ? 50% (TPS) ?Intent of Therapy: ?Non-Curative / Palliative Intent, Discussed with Patient ?

## 2021-06-02 NOTE — Progress Notes (Signed)
Oncology Nurse Navigator Documentation ? ?Oncology Nurse Navigator Flowsheets 06/02/2021 05/29/2021 05/25/2021 05/18/2021 05/15/2021  ?Abnormal Finding Date 12/13/2019 - - - -  ?Confirmed Diagnosis Date 03/26/2021 - - - -  ?Diagnosis Status Confirmed Diagnosis Complete - - - -  ?Planned Course of Treatment Radiation;Targeted Therapy - - - -  ?Phase of Treatment Targeted Therapy - - - -  ?Radiation Actual Start Date: 03/27/2021 - - - -  ?Radiation Actual End Date: 04/08/2021 - - - -  ?Navigator Follow Up Date: 06/04/2021 - - - 06/02/2021  ?Navigator Follow Up Reason: Appointment Review - - - New Patient Appointment  ?Navigator Location [facility]  ?Navigator Encounter Type Clinic/MDC;Initial MedOnc Telephone Telephone Telephone Telephone  ?Telephone - Outgoing Call [place] Call [facility] Call [place] Call  ?Treatment Initiated Date 03/27/2021 - - - -  ?Patient Visit Type Initial;MedOnc/I spoke with Ms. Smith today.  She has dx of lung cancer and her plan of care is IO therapy.  I explained treatment plan and provided information on AVS.  Will follow up on her schedule.  - - Other -  ?Treatment Phase Pre-Tx/Tx Discussion - - - -  ?Barriers/Navigation Needs Education Coordination of Care Coordination of Care Coordination of Care Coordination of Care  ?Education Newly Diagnosed Cancer Education;Smoking cessation;Understanding Cancer/ Treatment Options;Other - - - -  ?Interventions Psycho-Social Support;Education Coordination of Care Coordination of Care Coordination of Care Coordination of Care;Education  ?Acuity Level 3-Moderate Needs (3-4 Barriers Identified) Level 2-Minimal Needs (1-2 Barriers Identified) Level 2-Minimal Needs (1-2 Barriers Identified) Level 2-Minimal Needs (1-2 Barriers Identified) Level 2-Minimal Needs (1-2 Barriers Identified)  ?Coordination of Care - - Appts Appts Appts  ?Education Method Verbal;Other - Verbal - Verbal  ?Time Spent  with Patient 45 15 15 15 30   ?  ?

## 2021-06-02 NOTE — Patient Instructions (Signed)
Cemiplimab injection What is this medication? CEMIPLIMAB (se mip li mab) is a monoclonal antibody. It treats certain types of cancer. Some of the cancers treated are cutaneous squamous cell carcinoma and basal cell carcinoma. This medicine may be used for other purposes; ask your health care provider or pharmacist if you have questions. COMMON BRAND NAME(S): LIBTAYO What should I tell my care team before I take this medication? They need to know if you have any of these conditions: autoimmune diseases like Crohn's disease, ulcerative colitis, or lupus have had or planning to have an allogeneic stem cell transplant (uses someone else's stem cells) history of organ transplant nervous system problems like myasthenia gravis or Guillain-Barre syndrome an unusual or allergic reaction to cemiplimab, other drugs, foods, dyes, or preservatives pregnant or trying to get pregnant breast-feeding How should I use this medication? This medicine is for infusion into a vein. It is given by a health care professional in a hospital or clinic setting. A special MedGuide will be given to you before each treatment. Be sure to read this information carefully each time. Talk to your pediatrician regarding the use of this medicine in children. Special care may be needed. Overdosage: If you think you have taken too much of this medicine contact a poison control center or emergency room at once. NOTE: This medicine is only for you. Do not share this medicine with others. What if I miss a dose? It is important not to miss your dose. Call your doctor or health care professional if you are unable to keep an appointment. What may interact with this medication? Interactions have not been studied. This list may not describe all possible interactions. Give your health care provider a list of all the medicines, herbs, non-prescription drugs, or dietary supplements you use. Also tell them if you smoke, drink alcohol, or use  illegal drugs. Some items may interact with your medicine. What should I watch for while using this medication? Your condition will be monitored carefully while you are receiving this medicine. You may need blood work done while you are taking this medicine. Do not become pregnant while taking this medicine or for at least 4 months after stopping it. Women should inform their doctor if they wish to become pregnant or think they might be pregnant. There is a potential for serious side effects to an unborn child. Talk to your health care professional or pharmacist for more information. Do not breast-feed an infant while taking this medicine or for at least 4 months after the last dose. What side effects may I notice from receiving this medication? Side effects that you should report to your doctor or health care professional as soon as possible: allergic reactions like skin rash, itching or hives; swelling of the face, lips, or tongue black, tarry stools bloody or watery diarrhea breathing problems changes in vision changes in voice chest pain or chest tightness chills cough dizziness fast or irregular heart beat feeling faint or lightheaded hair loss increased hunger or thirst muscle weakness persistent headache redness, blistering, peeling or loosening of the skin, including inside the mouth signs and symptoms of kidney injury like trouble passing urine or change in the amount of urine signs and symptoms of liver injury like dark yellow or brown urine; general ill feeling or flu-like symptoms; light-colored stools; loss of appetite; nausea; right upper belly pain; unusually weak or tired; yellowing of the eyes or skin stomach pain unusual bleeding or bruising weight gain or weight loss unusual sweating  Side effects that usually do not require medical attention (report these to your doctor or health care professional if they continue or are bothersome): bone pain constipation muscle  pain tiredness This list may not describe all possible side effects. Call your doctor for medical advice about side effects. You may report side effects to FDA at 1-800-FDA-1088. Where should I keep my medication? This drug is given in a hospital or clinic and will not be stored at home. NOTE: This sheet is a summary. It may not cover all possible information. If you have questions about this medicine, talk to your doctor, pharmacist, or health care provider.  2022 Elsevier/Gold Standard (2020-12-02 00:00:00) PET Scan A PET scan, or positron emission tomography, is an imaging test. A large scanner with a camera creates pictures of your organs and tissues. The pictures are used to study and detect diseases such as cancer. For this test, a tiny amount of radioactive material, called a tracer, will be injected into your blood. The tracer will travel through your bloodstream and create colors and brightness on the pictures if there are problems. On a PET scan image, for example, cancer tissue appears brighter than normal tissue. Tell a health care provider about: Any allergies you have. All medicines you are taking, including vitamins, herbs, eye drops, creams, and over-the-counter medicines. Any blood disorders you have. Any surgeries you have had. Any medical conditions you have. Any fear of cramped spaces (claustrophobia). You may be given medicine to help you relax (sedative) or a medicine to treat anxiety if this is a problem. Any trouble you have staying still for long periods of time. Whether you are pregnant, may be pregnant, or are breastfeeding. What are the risks? Generally, this is a safe test. However, problems may occur, including: Bleeding, pain, or swelling at the IV site. Allergic reaction to the radioactive tracer. This is rare. Exposure to radiation (a small amount). What happens before the procedure? Follow instructions from your health care provider about eating or drinking  restrictions. Ask your health care provider about: Changing or stopping your regular medicines. This is especially important if you are taking diabetes medicines or blood thinners. Taking medicines such as aspirin and ibuprofen. These medicines can thin your blood. Do not take these medicines unless your health care provider tells you to take them. Taking over-the-counter medicines, vitamins, herbs, and supplements. If you have diabetes, ask your health care provider for diet guidelines to control your blood sugar (glucose) levels on the day of the test. What happens during the procedure?  An IV will be inserted into one of your veins. You may be given a sedative through the IV. A small amount of radioactive tracer will be injected through the IV. You will wait 30-60 minutes after the injection. This allows the tracer to travel through your body. You will lie on a cushioned table. The table will move through the center of the scanning machine. The scanning machine will create pictures of your body. This can last for about 30-60 minutes. You will need to stay very still during this time. The procedure may vary among health care providers and hospitals. What can I expect after the procedure? You may be taken to a recovery area if sedation medicines were used. Your blood pressure, heart rate, breathing rate, and blood oxygen level will be monitored until you leave the hospital or clinic. It is up to you to get your test results. Ask your health care provider, or the department that  is doing the test, when your results will be ready. Also ask: How will I get my results? What are my treatment options? What other tests do I need? What are my next steps? Follow these instructions at home: Drink 6-8 glasses of water after the test to flush the radioactive tracer out of your body. Drink enough fluid to keep your urine pale yellow. You may return to your normal diet and activities as told by your  health care provider. Summary A PET scan, or positron emission tomography, is an imaging test that creates pictures of your organs and tissues. The pictures are used to study and detect diseases such as cancer. For this test, a small amount of radioactive tracer will be injected and travel through your bloodstream. You will lie flat on a table and be very still. The table will move through the scanning machine that creates pictures of your body. If the images show colors and brightness, that is a sign of a problem in that area. Ask your health care provider what your results mean. This information is not intended to replace advice given to you by your health care provider. Make sure you discuss any questions you have with your health care provider. Document Revised: 11/26/2020 Document Reviewed: 07/21/2019 Elsevier Patient Education  2022 [place]. Smoking Tobacco Information, Adult Smoking tobacco can be harmful to your health. Tobacco contains a poisonous (toxic), colorless chemical called nicotine. Nicotine is addictive. It changes the brain and can make it hard to stop smoking. Tobacco also has other toxic chemicals that can hurt your body and raise your risk of many cancers. How can smoking tobacco affect me? Smoking tobacco puts you at risk for: Cancer. Smoking is most commonly associated with lung cancer, but can also lead to cancer in other parts of the body. Chronic obstructive pulmonary disease (COPD). This is a long-term lung condition that makes it hard to breathe. It also gets worse over time. High blood pressure (hypertension), heart disease, stroke, or heart attack. Lung infections, such as pneumonia. Cataracts. This is when the lenses in the eyes become clouded. Digestive problems. This may include peptic ulcers, heartburn, and gastroesophageal reflux disease (GERD). Oral health problems, such as gum disease and tooth loss. Loss of taste and smell. Smoking can affect your  appearance by causing: Wrinkles. Yellow or stained teeth, fingers, and fingernails. Smoking tobacco can also affect your social life, because: It may be challenging to find places to smoke when away from home. Many workplaces, [employer], hotels, and public places are tobacco-free. Smoking is expensive. This is due to the cost of tobacco and the long-term costs of treating health problems from smoking. Secondhand smoke may affect those around you. Secondhand smoke can cause lung cancer, breathing problems, and heart disease. Children of smokers have a higher risk for: Sudden infant death syndrome (SIDS). Ear infections. Lung infections. If you currently smoke tobacco, quitting now can help you: Lead a longer and healthier life. Look, smell, breathe, and feel better over time. Save money. Protect others from the harms of secondhand smoke. What actions can I take to prevent health problems? Quit smoking  Do not start smoking. Quit if you already do. Make a plan to quit smoking and commit to it. Look for programs to help you and ask your health care provider for recommendations and ideas. Set a date and write down all the reasons you want to quit. Let your friends and family know you are quitting so they can help and  support you. Consider finding friends who also want to quit. It can be easier to quit with someone else, so that you can support each other. Talk with your health care provider about using nicotine replacement medicines to help you quit, such as gum, lozenges, patches, sprays, or pills. Do not replace cigarette smoking with electronic cigarettes, which are commonly called e-cigarettes. The safety of e-cigarettes is not known, and some may contain harmful chemicals. If you try to quit but return to smoking, stay positive. It is common to slip up when you first quit, so take it one day at a time. Be prepared for cravings. When you feel the urge to smoke, chew gum or suck on hard  candy. Lifestyle Stay busy and take care of your body. Drink enough fluid to keep your urine pale yellow. Get plenty of exercise and eat a healthy diet. This can help prevent weight gain after quitting. Monitor your eating habits. Quitting smoking can cause you to have a larger appetite than when you smoke. Find ways to relax. Go out with friends or family to a movie or a restaurant where people do not smoke. Ask your health care provider about having regular tests (screenings) to check for cancer. This may include blood tests, imaging tests, and other tests. Find ways to manage your stress, such as meditation, yoga, or exercise. Where to find support To get support to quit smoking, consider: Asking your health care provider for more information and resources. Taking classes to learn more about quitting smoking. Looking for local organizations that offer resources about quitting smoking. Joining a support group for people who want to quit smoking in your local community. Calling the smokefree.gov counselor helpline: 1-800-Quit-Now 403 505 2839) Where to find more information You may find more information about quitting smoking from: HelpGuide.org: www.helpguide.org https://hall.com/: smokefree.gov American Lung Association: www.lung.org Contact a health care provider if you: Have problems breathing. Notice that your lips, nose, or fingers turn blue. Have chest pain. Are coughing up blood. Feel faint or you pass out. Have other health changes that cause you to worry. Summary Smoking tobacco can negatively affect your health, the health of those around you, your finances, and your social life. Do not start smoking. Quit if you already do. If you need help quitting, ask your health care provider. Think about joining a support group for people who want to quit smoking in your local community. There are many effective programs that will help you to quit this behavior. This information is  not intended to replace advice given to you by your health care provider. Make sure you discuss any questions you have with your health care provider. Document Revised: 11/17/2020 Document Reviewed: 02/05/2020 Elsevier Patient Education  2022 [place] Lung cancer is an abnormal growth of cancerous cells that forms a mass (malignant tumor) in a lung. There are several types of lung cancer. The types are based on the appearance of the tumor cells. The two most common types are: Non-small cell lung cancer. This type of lung cancer is the most common type. Non-small cell lung cancers include squamous cell carcinoma, adenocarcinoma, and large cell carcinoma. Small cell lung cancer. In this type of lung cancer, abnormal cells are smaller than those of non-small cell lung cancer. Small cell lung cancer gets worse (progresses) faster than non-small cell lung cancer. What are the causes? The most common cause of lung cancer is smoking tobacco. The second most common cause is exposure to a chemical called radon.  What increases the risk? You are more likely to develop this condition if: You smoke tobacco. You have been exposed to: Secondhand tobacco smoke. Radon gas. Uranium. Asbestos. Arsenic in drinking water. Air pollution and diesel exhaust. You have a family or personal history of lung cancer. You have had lung radiation therapy in the past. You are older than age 70. What are the signs or symptoms? In the early stages, you may not have any symptoms. As the cancer progresses, symptoms may include: A lasting cough, possibly with blood. Fatigue. Unexplained weight loss. Shortness of breath. High-pitched whistling sounds when you breathe, most often when you breathe out (wheezing). Chest pain. Loss of appetite. Symptoms of advanced lung cancer include: Hoarseness. Bone or joint pain. Weakness. Change in the structure of the fingernails (clubbing), so that the nail looks  like an upside-down spoon. Swelling of the face or arms. Inability to move the face (paralysis). Drooping eyelids. How is this diagnosed? This condition may be diagnosed based on: Your symptoms and medical history. A physical exam. A chest X-ray. A CT scan. Blood tests. Sputum tests. Removal of a sample of lung tissue (lung biopsy) for testing. Your cancer will be assessed (staged) to determine how severe it is and how much it has spread (metastasized). How is this treated? Treatment depends on the type and stage of your cancer. Treatment may include one or more of the following: Surgery to remove as much of the cancer as possible. Lymph nodes in the area may be removed and tested for cancer as well. Medicines that kill cancer cells (chemotherapy). High-energy rays that kill cancer cells (radiation therapy). Targeted therapy. This targets specific parts of cancer cells and the area around them to block the growth and spread of the cancer. Targeted therapy can help limit the damage to healthy cells. Immunotherapy. This treatment uses a person's own immune system to fight cancer by either boosting the immune system or changing how the immune system works. Follow these instructions at home:  Do not use any products that contain nicotine or tobacco. These products include cigarettes, chewing tobacco, and vaping devices, such as e-cigarettes. If you need help quitting, ask your health care provider. Do not drink alcohol. If you are admitted to the hospital, make sure your cancer specialist (oncologist) is aware. Your cancer may affect your treatment for other conditions. Take over-the-counter and prescription medicines only as told by your health care provider. Work with your health care provider to manage any side effects of treatment. Keep all follow-up visits. This is important. Where to find support Consider joining a local support group for people who have been diagnosed with lung  cancer. Where to find more information American Cancer Society: www.cancer.[place]): www.cancer.gov Contact a health care provider if you: Lose weight without trying. Have a persistent cough and wheezing. Feel short of breath. Get tired easily. Have bone or joint pain. Have difficulty swallowing. Notice that your voice is changing or getting hoarse. Have pain that does not get better with medicine. Get help right away if you: Cough up blood. Have chest pain or new breathing problems. Have a fever. Have swelling in an ankle, leg, or arm, or the face or neck. Have paralysis in your face. Are very confused. Have a drooping eyelid. These symptoms may represent a serious problem that is an emergency. Do not wait to see if the symptoms will go away. Get medical help right away. Call your local emergency services (911 in the  [place] Do not drive yourself to the hospital. Summary Lung cancer is an abnormal growth of cancerous cells that forms a mass (malignant tumor) in a lung. There are several types of lung cancer. The types are based on the appearance of the tumor cells. The two most common types are non-small cell and small cell. The most common cause of lung cancer is smoking tobacco. Early symptoms include a lasting cough, possibly with blood, and fatigue, unexplained weight loss, and shortness of breath. After diagnosis, treatment depends on the type and stage of your cancer. This information is not intended to replace advice given to you by your health care provider. Make sure you discuss any questions you have with your health care provider. Document Revised: 09/03/2020 Document Reviewed: 09/03/2020 Elsevier Patient Education  [place].

## 2021-06-02 NOTE — Progress Notes (Signed)
[place] Telephone:([phone]   Fax:([phone]  CONSULT NOTE  REFERRING PHYSICIAN: Dr. Tyler Pita  REASON FOR CONSULTATION:  66 years old African-American female diagnosed with lung cancer.  HPI Sandra Horne is a 66 y.o. female with long history of smoking who presented today for evaluation of metastatic lung cancer.  The patient was seen by Dr. Delton Coombes at [facility] on January 14, 2020 when she was found during evaluation at the emergency department to have left hilar mass and 1 episode of hemoptysis at that time.  She had incidental finding on CT of the abdomen on 12/13/2019 showing 3.8 cm left infrahilar mass.  This was followed by CT scan of the chest on the same day and that showed bulky left hilar and prevascular adenopathy with left infrahilar mass measuring 3.1 x 2.7 cm concerning for primary pulmonary neoplasm.  She had MRI of the brain at that time that was unremarkable.  The patient also had a PET scan on February 04, 2020 that showed hypermetabolic left hilar masslike nodal enlargement consistent with malignancy and favoring bronchogenic carcinoma.  There was no lung parenchymal primary identified and no evidence of metastatic disease in the abdomen or pelvis.  There was no metabolic activity within the left adrenal gland.  For some reason the patient was lost to follow-up and no further intervention were performed.  On March 15, 2021 she presented to the emergency department again complaining of headache after COVID-19 infection 2 weeks before.  During her evaluation she had CT scan of the head without contrast that showed large area of hypodensity in the left frontal, parietal and temporal lobe worrisome for vasogenic edema with mass effect with 6 mm of midline shift to the right.  CT scan of the chest on the same day showed left hilar/infrahilar mass lesion measuring 4.4 x 3.3 cm.  This is again consistent with a primary pulmonary  neoplasm.  Associated hilar and mediastinal lymph nodes are noted.  MRI of the brain on March 25, 2021 showed peripherally contrast-enhancing lesion of the left occipital lobe with large amount of surrounding edema suspicious for a solitary metastasis.  There was rightward midline shift of 8 mm.  The patient was seen by Dr. Loanne Drilling and on March 26, 2021 she underwent bronchoscopy with EBUS and biopsy of the left hilar mass.  The final cytology ([id number]) showed malignant cells consistent with non-small cell carcinoma from the FNA of the left hilar mass as well as the brushing of the left lower lobe. By immunohistochemistry, the neoplastic cells are positive for TTF-1 but negative for p40.  Overall, the more pathology and immunophenotype favoring adenocarcinoma. Molecular studies were performed by guardant 360 and that showed positive KRAS G12C mutation and PD-L1 expression was 75%. The patient underwent SRS to the solitary brain metastasis under the care of Dr. Tammi Klippel. Again for some unknown reason the patient was lost to follow-up until she was seen by Dr. Tammi Klippel for follow-up visit after her brain radiation and she was referred to me today for recommendation regarding her condition and treatment options. When seen today she is feeling fine with no concerning complaints except for cough productive of whitish sputum.  She lost around 8 pounds in the last few months.  She has no current chest pain, shortness of breath or hemoptysis.  She has no nausea, vomiting, diarrhea or constipation.  She denied having any headache or visual changes. Family history significant for mother with hypertension and  stroke.  Father had hypertension.  Sister had lung cancer with brain metastasis.  Son was killed. The patient is single and has 2 living children.  She was accompanied today by her daughter Sandra Horne and she has another son named Sandra Horne.  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  She has a  history of smoking less than 1 pack/day for around 44 years.  She drinks beer on daily basis.  She denied having any history of drug abuse.  HPI  No past medical history on file.  Past Surgical History:  Procedure Laterality Date   BRONCHIAL BRUSHINGS  03/26/2021   Procedure: BRONCHIAL BRUSHINGS;  Surgeon: Margaretha Seeds, MD;  Location: [facility];  Service: Cardiopulmonary;;   BRONCHIAL NEEDLE ASPIRATION BIOPSY  03/26/2021   Procedure: BRONCHIAL NEEDLE ASPIRATION BIOPSIES;  Surgeon: Margaretha Seeds, MD;  Location: [facility];  Service: Cardiopulmonary;;   BRONCHIAL WASHINGS  03/26/2021   Procedure: BRONCHIAL WASHINGS;  Surgeon: Margaretha Seeds, MD;  Location: [facility];  Service: Cardiopulmonary;;   ECTOPIC PREGNANCY SURGERY     ENDOBRONCHIAL ULTRASOUND Bilateral 03/26/2021   Procedure: ENDOBRONCHIAL ULTRASOUND;  Surgeon: Margaretha Seeds, MD;  Location: [facility];  Service: Cardiopulmonary;  Laterality: Bilateral;   HEMOSTASIS CONTROL  03/26/2021   Procedure: HEMOSTASIS CONTROL;  Surgeon: Margaretha Seeds, MD;  Location: [facility];  Service: Cardiopulmonary;;    Family History  Problem Relation Age of Onset   Hypertension Mother    Stroke Mother    Hypertension Father    Cancer Sister    Cancer Paternal Uncle     Social History Social History   Tobacco Use   Smoking status: Some Days    Packs/day: 0.25    Years: 40.00    Pack years: 10.00    Types: Cigarettes   Smokeless tobacco: Never  Vaping Use   Vaping Use: Never used  Substance Use Topics   Alcohol use: Not Currently    Comment: drinks beer rarely    Drug use: Not Currently    Types: Marijuana    Comment: last marijuana use mid 11/19/2019    No Known Allergies  Current Outpatient Medications  Medication Sig Dispense Refill   naproxen (NAPROSYN) 375 MG tablet Take 1 tablet (375 mg total) by mouth 2 (two) times daily. 20 tablet 0   polyethylene glycol (MIRALAX) 17 g packet Take 17 g by  mouth daily. (Patient not taking: Reported on 03/25/2021) 14 each 0   No current facility-administered medications for this visit.    Review of Systems  Constitutional: positive for fatigue and weight loss Eyes: negative Ears, nose, mouth, throat, and face: negative Respiratory: positive for cough Cardiovascular: negative Gastrointestinal: negative Genitourinary:negative Integument/breast: negative Hematologic/lymphatic: negative Musculoskeletal:negative Neurological: negative Behavioral/Psych: negative Endocrine: negative Allergic/Immunologic: negative  Physical Exam  DIY:MEBRA, healthy, no distress, well nourished, and well developed SKIN: skin color, texture, turgor are normal, no rashes or significant lesions HEAD: Normocephalic, No masses, lesions, tenderness or abnormalities EYES: normal, PERRLA, Conjunctiva are pink and non-injected EARS: External ears normal, Canals clear OROPHARYNX:no exudate, no erythema, and lips, buccal mucosa, and tongue normal  NECK: supple, no adenopathy, no JVD LYMPH:  no palpable lymphadenopathy, no hepatosplenomegaly BREAST:not examined LUNGS: clear to auscultation , and palpation HEART: regular rate & rhythm, no murmurs, and no gallops ABDOMEN:abdomen soft, non-tender, normal bowel sounds, and no masses or organomegaly BACK: Back symmetric, no curvature., No CVA tenderness EXTREMITIES:no joint deformities, effusion, or inflammation, no edema  NEURO:  alert & oriented x 3 with fluent speech, no focal motor/sensory deficits  PERFORMANCE STATUS: ECOG 1  LABORATORY DATA: Lab Results  Component Value Date   WBC 8.2 06/02/2021   HGB 10.4 (L) 06/02/2021   HCT 31.3 (L) 06/02/2021   MCV 89.4 06/02/2021   PLT 337 06/02/2021      Chemistry      Component Value Date/Time   NA 139 03/26/2021 0649   K 4.1 03/26/2021 0649   CL 106 03/26/2021 0649   CO2 26 03/26/2021 0649   BUN 11 03/26/2021 0649   CREATININE 0.64 03/26/2021 0649       Component Value Date/Time   CALCIUM 8.6 (L) 03/26/2021 0649   ALKPHOS 70 03/25/2021 0709   AST 24 03/25/2021 0709   ALT 39 03/25/2021 0709   BILITOT 0.5 03/25/2021 0709       RADIOGRAPHIC STUDIES: No results found.  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 Molecular studies by [id number] showed positive KRAS G12C mutation and PD-L1 expression was 75%. The patient underwent SRS to the solitary brain metastasis.  PLAN: I had a lengthy discussion with the patient and her daughter today about her current disease stage, prognosis and treatment options. I personally and independently reviewed the scan images and discussed the results and showed the images to the patient and her daughter. I recommended for the patient to have repeat PET scan performed for further evaluation of her condition and to rule out any other metastatic disease. I discussed with the patient her treatment options and she understands that she has incurable condition and all the treatment will be of palliative nature. I discussed with the patient the option of palliative care and hospice referral versus consideration of palliative treatment with single agent immunotherapy with Libtayo (Cempilimab) because of the high PD-L1 expression of 75% versus a combination of chemo and immunotherapy. The patient is interested in treatment with single agent immunotherapy at this point. She also has the option of treatment with Lumakras (Sotorasib) or [employer] Education officer, [employer]) if she developed disease progression on the frontline therapy. She is expected to start the first cycle of this treatment next week. I will arrange for the patient  to have a chemotherapy education class before the first dose of her treatment. I will call her pharmacy with prescription for Compazine 10 mg p.o. every 6 hours as needed for nausea. The patient will come back for follow-up visit in 2 weeks for evaluation and management of any adverse effect of her treatment and also for discussion of the PET scan results. For the smoking cessation I strongly encouraged the patient to quit smoking The patient was advised to call immediately if she has any other concerning symptoms in the interval.  The patient voices understanding of current disease status and treatment options and is in agreement with the current care plan.  All questions were answered. The patient knows to call the clinic with any problems, questions or concerns. We can certainly see the patient much sooner if necessary.  Thank you so much for allowing me to participate in the care of Elim. I will continue to follow up the patient with you and assist in her care.  The total time spent in the appointment  was 90 minutes.  Disclaimer: This note was dictated with voice recognition software. Similar sounding words can inadvertently be transcribed and may not be corrected upon review.   Eilleen Kempf June 02, 2021, 1:58 PM

## 2021-06-03 NOTE — Progress Notes (Signed)
Pharmacist Chemotherapy Monitoring - Initial Assessment   ? ?Anticipated start date: 06/10/21  ? ?The following has been reviewed per standard work regarding the patient's treatment regimen: ?The patient's diagnosis, treatment plan and drug doses, and organ/hematologic function ?Lab orders and baseline tests specific to treatment regimen  ?The treatment plan start date, drug sequencing, and pre-medications ?Prior authorization status  ?Patient's documented medication list, including drug-drug interaction screen and prescriptions for anti-emetics and supportive care specific to the treatment regimen ?The drug concentrations, fluid compatibility, administration routes, and timing of the medications to be used ?The patient's access for treatment and lifetime cumulative dose history, if applicable  ?The patient's medication allergies and previous infusion related reactions, if applicable  ? ?Changes made to treatment plan:  ?N/A ? ?Follow up needed:  ?N/A ? ? ?Sandra Horne, [place], ?06/03/2021  2:13 PM  ?

## 2021-06-04 ENCOUNTER — Telehealth: Payer: Self-pay | Admitting: Internal Medicine

## 2021-06-04 NOTE — Telephone Encounter (Signed)
Scheduled per los, I tried to call patient numerous times regarding upcoming scheduled appointments. All three numbers voicemail's are full. Calender will be mailed. ?

## 2021-06-05 ENCOUNTER — Other Ambulatory Visit: Payer: Self-pay

## 2021-06-05 ENCOUNTER — Encounter: Payer: Self-pay | Admitting: Internal Medicine

## 2021-06-05 ENCOUNTER — Inpatient Hospital Stay: Payer: Medicaid Other

## 2021-06-08 ENCOUNTER — Encounter: Payer: Self-pay | Admitting: Internal Medicine

## 2021-06-09 ENCOUNTER — Encounter: Payer: Self-pay | Admitting: Internal Medicine

## 2021-06-09 NOTE — Progress Notes (Signed)
Called pt to introduce myself as her Arboriculturist and to discuss the [employer].  Unfortunately there aren't any foundations offering copay assistance for her Dx and the type of ins she has.  I attempted to leave a msg but her mailbox is full.  I will plan to meet her at a future visit.  ?

## 2021-06-10 ENCOUNTER — Inpatient Hospital Stay: Payer: Medicaid Other

## 2021-06-10 ENCOUNTER — Telehealth: Payer: Self-pay | Admitting: *Deleted

## 2021-06-10 NOTE — Telephone Encounter (Signed)
I attempted to call the pt regarding her missed lab and infusion today. There was no answer and the mailbox was full. I also tried calling both of pts daughters and there was no answer. I was not able to leave a message on any of the numbers listed. ?

## 2021-06-10 NOTE — Telephone Encounter (Signed)
TCT patient and her spouse regarding appt scheduled for today. No answer on either phone and both vm were full. Unable to leave vm message ?

## 2021-06-11 ENCOUNTER — Ambulatory Visit: Payer: Medicaid Other

## 2021-06-11 ENCOUNTER — Other Ambulatory Visit: Payer: Medicaid Other

## 2021-06-11 ENCOUNTER — Telehealth: Payer: Self-pay | Admitting: Internal Medicine

## 2021-06-11 NOTE — Telephone Encounter (Signed)
R/s per 3/15 inbasket, could not get in touch with pt, spoke with pt daughter , pt daughter aware ?

## 2021-06-15 ENCOUNTER — Other Ambulatory Visit: Payer: Self-pay

## 2021-06-15 DIAGNOSIS — C3432 Malignant neoplasm of lower lobe, left bronchus or lung: Secondary | ICD-10-CM

## 2021-06-16 ENCOUNTER — Telehealth: Payer: Self-pay | Admitting: Medical Oncology

## 2021-06-16 ENCOUNTER — Inpatient Hospital Stay: Payer: Medicaid Other

## 2021-06-16 ENCOUNTER — Inpatient Hospital Stay: Payer: Medicaid Other | Admitting: Internal Medicine

## 2021-06-16 NOTE — Telephone Encounter (Signed)
Ciera does not know this pt. I removed her contact information. ?

## 2021-06-27 NOTE — Progress Notes (Deleted)
[place] ?OFFICE PROGRESS NOTE ? ?Pcp, No ?No address on file ? 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 The patient underwent SRS to the solitary brain metastasis ? ?Molecular studies by [id number] showed positive KRAS G12C mutation and PD-L1 expression was 75%. ? ?PRIOR THERAPY: SRS to the solitary brain metastasis under the care of Dr. Tammi Klippel  ? ?CURRENT THERAPY:  Single agent immunotherapy with Libtayo (Cempilimab), first dose expected on 06/30/21 ? ?INTERVAL HISTORY: ?Sandra Horne 66 y.o. female returns to the clinic today for a follow up visit. The patient was first seen in the clinic on 06/02/21. She was initially seen to have suspicious with stage IV lung cancer initially in Fall 2023 but was lost to follow up for further work up. She did eventually receive SRS to the metastatic brain lesions and was seen by Dr. Julien Nordmann on 06/02/21. She was supposed to start single agent immunotherapy with Libtayo on 06/09/21 but she did not show up to her appointment.  ? ?She is here to consider resuming her treatment. She denies any fevers, chill, night sweats, weight loss. She denies chest pain, shortness of breath, or hemoptysis. She denies nausea, vomiting, diarrhea, or constipation. She denies headaches or visual changes. She was supposed to have a repeat PET scan to restage her disease, however, this has not been performed. She is here for evaluation and repeat blood work before starting cycle #1.  ? ? ? ? ? ?MEDICAL HISTORY:No past medical history on file. ? ?ALLERGIES:  has No Known Allergies. ? ?MEDICATIONS:  ?Current Outpatient Medications  ?Medication Sig Dispense Refill  ? prochlorperazine (COMPAZINE) 10 MG tablet Take 1 tablet (10 mg total) by mouth every 6 (six) hours as  needed for nausea or vomiting. 30 tablet 0  ? ?No current facility-administered medications for this visit.  ? ? ?SURGICAL HISTORY:  ?Past Surgical History:  ?Procedure Laterality Date  ? BRONCHIAL BRUSHINGS  03/26/2021  ? Procedure: BRONCHIAL BRUSHINGS;  Surgeon: Margaretha Seeds, MD;  Location: [facility];  Service: Cardiopulmonary;;  ? BRONCHIAL NEEDLE ASPIRATION BIOPSY  03/26/2021  ? Procedure: BRONCHIAL NEEDLE ASPIRATION BIOPSIES;  Surgeon: Margaretha Seeds, MD;  Location: [facility];  Service: Cardiopulmonary;;  ? BRONCHIAL WASHINGS  03/26/2021  ? Procedure: BRONCHIAL WASHINGS;  Surgeon: Margaretha Seeds, MD;  Location: [facility];  Service: Cardiopulmonary;;  ? ECTOPIC PREGNANCY SURGERY    ? ENDOBRONCHIAL ULTRASOUND Bilateral 03/26/2021  ? Procedure: ENDOBRONCHIAL ULTRASOUND;  Surgeon: Margaretha Seeds, MD;  Location: [facility];  Service: Cardiopulmonary;  Laterality: Bilateral;  ? HEMOSTASIS CONTROL  03/26/2021  ? Procedure: HEMOSTASIS CONTROL;  Surgeon: Margaretha Seeds, MD;  Location: [facility];  Service: Cardiopulmonary;;  ? ? ?REVIEW OF SYSTEMS:   ?Review of Systems  ?Constitutional: Negative for appetite change, chills, fatigue, fever and unexpected weight change.  ?HENT:   Negative for mouth sores, nosebleeds, sore throat and trouble swallowing.   ?Eyes: Negative for eye problems and icterus.  ?Respiratory: Negative for cough, hemoptysis, shortness of breath and wheezing.   ?Cardiovascular: Negative for chest pain and leg swelling.  ?Gastrointestinal: Negative for abdominal pain, constipation, diarrhea, nausea and vomiting.  ?Genitourinary: Negative for bladder incontinence, difficulty urinating, dysuria, frequency and hematuria.   ?Musculoskeletal: Negative for back pain, gait problem, neck pain and neck stiffness.  ?Skin: Negative for  itching and rash.  ?Neurological: Negative for dizziness, extremity weakness, gait problem, headaches, light-headedness and seizures.  ?Hematological:  Negative for adenopathy. Does not bruise/bleed easily.  ?Psychiatric/Behavioral: Negative for confusion, depression and sleep disturbance. The patient is not nervous/anxious.   ? ? ?PHYSICAL EXAMINATION:  ?There were no vitals taken for this visit. ? ?ECOG PERFORMANCE STATUS: {CHL ONC ECOG BJ:[id number]} ? ?Physical Exam  ?Constitutional: Oriented to person, place, and time and well-developed, well-nourished, and in no distress. No distress.  ?HENT:  ?Head: Normocephalic and atraumatic.  ?Mouth/Throat: Oropharynx is clear and moist. No oropharyngeal exudate.  ?Eyes: Conjunctivae are normal. Right eye exhibits no discharge. Left eye exhibits no discharge. No scleral icterus.  ?Neck: Normal range of motion. Neck supple.  ?Cardiovascular: Normal rate, regular rhythm, normal heart sounds and intact distal pulses.   ?Pulmonary/Chest: Effort normal and breath sounds normal. No respiratory distress. No wheezes. No rales.  ?Abdominal: Soft. Bowel sounds are normal. Exhibits no distension and no mass. There is no tenderness.  ?Musculoskeletal: Normal range of motion. Exhibits no edema.  ?Lymphadenopathy:  ?  No cervical adenopathy.  ?Neurological: Alert and oriented to person, place, and time. Exhibits normal muscle tone. Gait normal. Coordination normal.  ?Skin: Skin is warm and dry. No rash noted. Not diaphoretic. No erythema. No pallor.  ?Psychiatric: Mood, memory and judgment normal.  ?Vitals reviewed. ? ?LABORATORY DATA: ?Lab Results  ?Component Value Date  ? WBC 8.2 06/02/2021  ? HGB 10.4 (L) 06/02/2021  ? HCT 31.3 (L) 06/02/2021  ? MCV 89.4 06/02/2021  ? PLT 337 06/02/2021  ? ? ?  Chemistry   ?   ?Component Value Date/Time  ? NA 140 06/02/2021 1342  ? K 4.3 06/02/2021 1342  ? CL 108 06/02/2021 1342  ? CO2 29 06/02/2021 1342  ? BUN 5 (L) 06/02/2021 1342  ? CREATININE 0.68 06/02/2021 1342  ?    ?Component Value Date/Time  ? CALCIUM 8.7 (L) 06/02/2021 1342  ? ALKPHOS 92 06/02/2021 1342  ? AST 17 06/02/2021 1342  ? ALT 25  06/02/2021 1342  ? BILITOT 0.5 06/02/2021 1342  ?  ? ? ? ?RADIOGRAPHIC STUDIES: ? ?No results found. ? ? ?ASSESSMENT/PLAN:  ? 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 ?Molecular studies by [id number] showed positive KRAS G12C mutation and PD-L1 expression was 75%. ?The patient underwent SRS to the solitary brain metastasis under the care of Dr. Tammi Klippel. ? ?Dr. Julien Nordmann had recommended restaging her disease with a PET scan at her last visit. This has not been performed yet.  ? ?The patient was seen with Dr. Julien Nordmann. Labs were reviewed. Recommend that she ***.  ? ?She is currently undergoing treatment with Libtayo due to her immunotherapy expression of 75%  ? ?She also has the option of treatment with Lumakras (Sotorasib) or [employer] Lev[employer] Education officer, [employer]) if she developed disease progression on the frontline therapy. ? ?Labs were reviewed. Recommend that she *** with cycle #1 today as scheduled.  ? ?Importance of keeping appointments*** ? ?We will see her back for a follow up in 3 weeks for evaluation and repeat blood work before starting cycle #2.  ? ?The patient was advised to call immediately if she has any concerning symptoms in the interval. ?The patient voices understanding of  current disease status and treatment options and is in agreement with the current care plan. ?All questions were answered. The patient knows to call the clinic with any problems, questions or concerns. We can certainly see the patient much sooner if necessary ? ? ? ? ? ? ? ? ? ? ? ? ?No orders of the defined types were placed in this encounter. ?  ? ?I spent {CHL ONC TIME VISIT - CZGQH:[id number]} counseling the patient face to face. The total time spent in  the appointment was {CHL ONC TIME VISIT - GZQJS:[id number]}. ? ?Tirsa Gail L Shadi Larner, PA-C ?06/27/21 ? ?

## 2021-06-30 ENCOUNTER — Inpatient Hospital Stay: Payer: Medicaid Other | Attending: Internal Medicine

## 2021-06-30 ENCOUNTER — Inpatient Hospital Stay: Payer: Medicaid Other | Admitting: Physician Assistant

## 2021-06-30 ENCOUNTER — Telehealth: Payer: Self-pay

## 2021-06-30 ENCOUNTER — Inpatient Hospital Stay: Payer: Medicaid Other

## 2021-06-30 NOTE — Telephone Encounter (Signed)
I called pt and her daughter, [name], answered the phone and advised the pt was in the store shopping but could relay the information. Pts daughter was advised that pt was scheduled for labs/OV and treatment today and has missed these appts. I advised pt would be rescheduled for next week. I also advised pt has a CT scan on  ?

## 2021-07-01 ENCOUNTER — Telehealth: Payer: Self-pay | Admitting: Internal Medicine

## 2021-07-01 NOTE — Telephone Encounter (Signed)
.  Called patient to schedule appointment per 4/3 pt req, unable to leave msg, appt was previously scheduled ?

## 2021-07-03 ENCOUNTER — Ambulatory Visit (HOSPITAL_COMMUNITY)
Admission: RE | Admit: 2021-07-03 | Discharge: 2021-07-03 | Disposition: A | Discharge status: Home / Self Care | Payer: Medicaid Other | Source: Ambulatory Visit | Attending: Radiation Oncology | Admitting: Radiation Oncology

## 2021-07-03 NOTE — Progress Notes (Signed)
Pt did not show up for MRI Brain appt today. ?

## 2021-07-06 ENCOUNTER — Inpatient Hospital Stay: Payer: Medicaid Other

## 2021-07-07 ENCOUNTER — Encounter: Payer: Self-pay | Admitting: Radiation Therapy

## 2021-07-07 NOTE — Progress Notes (Signed)
I attempted all three numbers in the patient's chart to reach out about the missed brain MRI and follow-up with [name] Bruning, PA-C. I had actually talked to the patient's daughter last week to remind them of the brain MRI scan appointment and she stated that they were aware of this visit and planned to attend.  ? ?I will attempt again later in the day to see if I am able to get in touch with someone, but am unable to leave any messages because the voicemail boxes are all full.  ? ?Mont Dutton R.T.(R)(T) ?Radiation Special Procedures Navigator  ?

## 2021-07-08 ENCOUNTER — Telehealth: Payer: Self-pay | Admitting: *Deleted

## 2021-07-08 ENCOUNTER — Inpatient Hospital Stay: Payer: Medicaid Other | Admitting: Internal Medicine

## 2021-07-08 ENCOUNTER — Inpatient Hospital Stay: Payer: Medicaid Other

## 2021-07-08 ENCOUNTER — Encounter: Payer: Self-pay | Admitting: *Deleted

## 2021-07-08 ENCOUNTER — Encounter: Payer: Self-pay | Admitting: Internal Medicine

## 2021-07-08 NOTE — Progress Notes (Signed)
Oncology Nurse Navigator Documentation ? ? ?  07/08/2021  ?  3:00 PM 06/02/2021  ?  3:00 PM 05/29/2021  ?  4:00 PM 05/25/2021  ?  3:00 PM 05/18/2021  ?  1:00 PM 05/15/2021  ?  3:00 PM  ?Oncology Nurse Navigator Flowsheets  ?Abnormal Finding Date  12/13/2019      ?Confirmed Diagnosis Date  03/26/2021      ?Diagnosis Status  Confirmed Diagnosis Complete      ?Planned Course of Treatment  Radiation;Targeted Therapy      ?Phase of Treatment  Targeted Therapy      ?Radiation Actual Start Date:  03/27/2021      ?Radiation Actual End Date:  04/08/2021      ?Navigator Follow Up Date: 07/10/2021 06/04/2021    06/02/2021  ?Navigator Follow Up Reason: Patient Call/I called patient due to she was a no show to her appt today.  I wasn unable to reach nor leave a vm message.  Appointment Review    New Patient Appointment  ?Navigator Location [facility]  ?Navigator Encounter Type Telephone Clinic/MDC;Initial MedOnc Telephone Telephone Telephone Telephone  ?Telephone Outgoing Call  [place] Call [place] Call [place] Call [place] Call  ?Treatment Initiated Date  03/27/2021      ?Patient Visit Type  Initial;MedOnc   Other   ?Treatment Phase Pre-Tx/Tx Discussion Pre-Tx/Tx Discussion      ?Barriers/Navigation Needs  Education Coordination of Care Coordination of Care Coordination of Care Coordination of Care  ?Education  Newly Diagnosed Cancer Education;Smoking cessation;Understanding Cancer/ Treatment Options;Other      ?Interventions  Psycho-Social Support;Education Coordination of Care Coordination of Care Coordination of Care Coordination of Care;Education  ?Acuity Level 2-Minimal Needs (1-2 Barriers Identified) Level 3-Moderate Needs (3-4 Barriers Identified) Level 2-Minimal Needs (1-2 Barriers Identified) Level 2-Minimal Needs (1-2 Barriers Identified) Level 2-Minimal Needs (1-2 Barriers Identified) Level 2-Minimal Needs (1-2 Barriers Identified)   ?Coordination of Care    Appts Appts Appts  ?Education Method  Verbal;Other  Verbal  Verbal  ?Time Spent with Patient 15 45 15 15 15 30   ?  ?

## 2021-07-08 NOTE — Telephone Encounter (Signed)
I called pt and her daughter, [name], answered the phone and advised the pt was in the store shopping but could relay the information. Pts daughter was advised that pt was scheduled for labs/OV and treatment today and has missed these appts. I advised pt would be rescheduled for next week. I also advised pt has a CT scan on 07/03/21 and to please be sure she presents for this appt. Pt daughter expressed understanding of this information and states she will be sure the pt makes it to her future appts.  ?

## 2021-07-08 NOTE — Telephone Encounter (Signed)
Attempted to contact patient again without success ?

## 2021-07-08 NOTE — Telephone Encounter (Signed)
Patient scheduled for following appts this AM: Lab/Dr. Mohamed/ Infusion ?Contacted patient at all 3 numbers available - patient and spouse MB full, daughter's message- "call cannot be completed at this time". ? ?

## 2021-07-09 ENCOUNTER — Ambulatory Visit: Payer: Self-pay | Admitting: Urology

## 2021-07-10 ENCOUNTER — Telehealth: Payer: Self-pay | Admitting: Radiation Therapy

## 2021-07-10 ENCOUNTER — Ambulatory Visit: Payer: Medicaid Other | Admitting: Urology

## 2021-07-10 ENCOUNTER — Encounter: Payer: Self-pay | Admitting: *Deleted

## 2021-07-10 NOTE — Progress Notes (Signed)
Oncology Nurse Navigator Documentation ? ? ?  07/10/2021  ?  1:00 PM 07/08/2021  ?  3:00 PM 06/02/2021  ?  3:00 PM 05/29/2021  ?  4:00 PM 05/25/2021  ?  3:00 PM 05/18/2021  ?  1:00 PM 05/15/2021  ?  3:00 PM  ?Oncology Nurse Navigator Flowsheets  ?Abnormal Finding Date   12/13/2019      ?Confirmed Diagnosis Date   03/26/2021      ?Diagnosis Status   Confirmed Diagnosis Complete      ?Planned Course of Treatment   Radiation;Targeted Therapy      ?Phase of Treatment   Targeted Therapy      ?Radiation Actual Start Date:   03/27/2021      ?Radiation Actual End Date:   04/08/2021      ?Navigator Follow Up Date: 07/27/2021 07/10/2021 06/04/2021    06/02/2021  ?Navigator Follow Up Reason: Patient Call Patient Call Appointment Review    New Patient Appointment  ?Navigator Location [facility]  ?Navigator Encounter Type Telephone Telephone Clinic/MDC;Initial MedOnc Telephone Telephone Telephone Telephone  ?Telephone Outgoing Call [place] Call  [place] Call [place] Call [place] Call Outgoing Call  ?Treatment Initiated Date   03/27/2021      ?Patient Visit Type   Initial;MedOnc   Other   ?Treatment Phase Pre-Tx/Tx Discussion Pre-Tx/Tx Discussion Pre-Tx/Tx Discussion      ?Barriers/Navigation Needs Education;Coordination of Care/I received a message today from rad onc. Patient does not want "chemotherapy". I called Sandra Horne to explain she will be getting immunotherapy and the difference between the two.  I listened to her concerns and fears.  At the end of the conversation, she said she will get her MRI brain and then decide if she want immunotherapy.  She seemed relieved after me explaining the plan of care.  Will follow up with her after MRI brain.  I notified scheduling to cancel her appts for infusion on 4/25 but to keep the rest of the plan.   Education Coordination of Care Coordination of Care Coordination of Care Coordination of  Care  ?Education Other  Newly Diagnosed Cancer Education;Smoking cessation;Understanding Cancer/ Treatment Options;Other      ?Interventions Education;Coordination of Care  Psycho-Social Support;Education Coordination of Care Coordination of Care Coordination of Care Coordination of Care;Education  ?Acuity Level 3-Moderate Needs (3-4 Barriers Identified) Level 2-Minimal Needs (1-2 Barriers Identified) Level 3-Moderate Needs (3-4 Barriers Identified) Level 2-Minimal Needs (1-2 Barriers Identified) Level 2-Minimal Needs (1-2 Barriers Identified) Level 2-Minimal Needs (1-2 Barriers Identified) Level 2-Minimal Needs (1-2 Barriers Identified)  ?Coordination of Care Other    Appts Appts Appts  ?Education Method Verbal  Verbal;Other  Verbal  Verbal  ?Time Spent with Patient 45 15 45 15 15 15 30   ?  ?

## 2021-07-10 NOTE — Telephone Encounter (Signed)
I spoke with [name] about her recently missed brain MRI and medical oncology appointments. She said she was waiting to ger her car back so she could get to her appointments without having to pay other friends or family to bring her. The brain MRI and follow-up with [name] have been rescheduled and the patient is aware of these appointments.  ? ?During our conversation she said that she does not want to have infusions, " I am afraid of injecting anything into my body. " I will share this with Norton Blizzard, the lung navigator.  ? ?I found that calling her phone multiple times is the best way to get in touch with her. Although the voicemail box is still full and she will not answer the first call, when I repeatedly called she answered the fourth attempt.  ? ?Mont Dutton R.T.(R)(T) ?Radiation Special Procedures Navigator  ?

## 2021-07-13 ENCOUNTER — Encounter: Payer: Self-pay | Admitting: *Deleted

## 2021-07-13 NOTE — Progress Notes (Signed)
Oncology Nurse Navigator Documentation ? ? ?  07/13/2021  ?  1:00 PM 07/10/2021  ?  1:00 PM 07/08/2021  ?  3:00 PM 06/02/2021  ?  3:00 PM 05/29/2021  ?  4:00 PM 05/25/2021  ?  3:00 PM 05/18/2021  ?  1:00 PM  ?Oncology Nurse Navigator Flowsheets  ?Abnormal Finding Date    12/13/2019     ?Confirmed Diagnosis Date    03/26/2021     ?Diagnosis Status    Confirmed Diagnosis Complete     ?Planned Course of Treatment    Radiation;Targeted Therapy     ?Phase of Treatment    Targeted Therapy     ?Radiation Actual Start Date:    03/27/2021     ?Radiation Actual End Date:    04/08/2021     ?Navigator Follow Up Date: 08/03/2021 07/27/2021 07/10/2021 06/04/2021     ?Navigator Follow Up Reason: Follow-up Appointment Patient Call Patient Call Appointment Review     ?Navigator Location [facility] [facility] [facility]  ?Navigator Encounter Type Telephone Telephone Telephone Clinic/MDC;Initial MedOnc Telephone Telephone Telephone  ?Telephone Outgoing Call [place] Call [place] Call  [place] Call [place] Call Outgoing Call  ?Treatment Initiated Date    03/27/2021     ?Patient Visit Type    Initial;MedOnc   Other  ?Treatment Phase Other Pre-Tx/Tx Discussion Pre-Tx/Tx Discussion Pre-Tx/Tx Discussion     ?Barriers/Navigation Needs Coordination of Care;Education/I followed up on Sandra Horne's appts and noticed a few missing appts. I called patient to get an update. She states she is getting married and leaving for [place] on 4/27.  She will be gone until 4/30.  I explained the need for pet scan and follow up appt with Dr. Julien Nordmann.  She would like to re-schedule. I called radiology and was able to get a new appt for pet. I called patient back and gave her the appt for pet and pre-procedure instructions.  I also gave her a follow up appt with med onc. I notified Mont Dutton to re-schedule her MRI brain.  Patient verbalized understanding.   Education;Coordination of Care   Education Coordination of Care Coordination of Care Coordination of Care  ?Education Other Other  Newly Diagnosed Cancer Education;Smoking cessation;Understanding Cancer/ Treatment Options;Other     ?Interventions Coordination of Care;Education;Psycho-Social Support Education;Coordination of Care  Psycho-Social Support;Education Coordination of Care Coordination of Care Coordination of Care  ?Acuity Level 3-Moderate Needs (3-4 Barriers Identified) Level 3-Moderate Needs (3-4 Barriers Identified) Level 2-Minimal Needs (1-2 Barriers Identified) Level 3-Moderate Needs (3-4 Barriers Identified) Level 2-Minimal Needs (1-2 Barriers Identified) Level 2-Minimal Needs (1-2 Barriers Identified) Level 2-Minimal Needs (1-2 Barriers Identified)  ?Coordination of Care Appts Other    Appts Appts  ?Education Method Verbal Verbal  Verbal;Other  Verbal   ?Time Spent with Patient 60 45 15 45 15 15 15   ?  ?

## 2021-07-14 ENCOUNTER — Encounter: Payer: Self-pay | Admitting: Internal Medicine

## 2021-07-14 ENCOUNTER — Telehealth: Payer: Self-pay | Admitting: Radiation Therapy

## 2021-07-14 NOTE — Telephone Encounter (Signed)
[name] requested that her brain MRI be rescheduled because she is going to be out of town the end of April. I have rescheduled this scan and the telephone follow-up with Sandra Horne. I was unable to reach [name] on the phone or leave a message for her, but did speak with her grand-daughter about the new appointment schedule. The patient's grand-daughter said she will share this with the patient.  ? ?Sandra Horne R.T.(R)(T) ?Radiation Special Procedures Navigator  ?

## 2021-07-21 ENCOUNTER — Inpatient Hospital Stay: Payer: Medicaid Other

## 2021-07-21 ENCOUNTER — Inpatient Hospital Stay: Payer: Medicaid Other | Admitting: Internal Medicine

## 2021-07-24 ENCOUNTER — Other Ambulatory Visit (HOSPITAL_COMMUNITY): Payer: Medicaid Other

## 2021-07-27 ENCOUNTER — Encounter: Payer: Self-pay | Admitting: *Deleted

## 2021-07-27 NOTE — Progress Notes (Signed)
Oncology Nurse Navigator Documentation ? ? ?  07/27/2021  ?  3:00 PM 07/13/2021  ?  1:00 PM 07/10/2021  ?  1:00 PM 07/08/2021  ?  3:00 PM 06/02/2021  ?  3:00 PM 05/29/2021  ?  4:00 PM 05/25/2021  ?  3:00 PM  ?Oncology Nurse Navigator Flowsheets  ?Abnormal Finding Date     12/13/2019    ?Confirmed Diagnosis Date     03/26/2021    ?Diagnosis Status     Confirmed Diagnosis Complete    ?Planned Course of Treatment     Radiation;Targeted Therapy    ?Phase of Treatment     Targeted Therapy    ?Radiation Actual Start Date:     03/27/2021    ?Radiation Actual End Date:     04/08/2021    ?Navigator Follow Up Date:  08/03/2021 07/27/2021 07/10/2021 06/04/2021    ?Navigator Follow Up Reason:  Follow-up Appointment Patient Call Patient Call Appointment Review    ?Navigator Location [facility] [place] [facility] [facility]  ?Navigator Encounter Type Appt/Treatment Plan Review Telephone Telephone Telephone Clinic/MDC;Initial MedOnc Telephone Telephone  ?Telephone  Outgoing Call [place] Call [place] Call  [place] Call [place] Call  ?Treatment Initiated Date     03/27/2021    ?Patient Visit Type     Initial;MedOnc    ?Treatment Phase Pre-Tx/Tx Discussion Other Pre-Tx/Tx Discussion Pre-Tx/Tx Discussion Pre-Tx/Tx Discussion    ?Barriers/Navigation Needs Coordination of Care/I followed up and noticed [name] has a treatment schedule.  Will follow up at her next appt.  Coordination of Care;Education Education;Coordination of Care  Education Coordination of Care Coordination of Care  ?Education  Other Other  Newly Diagnosed Cancer Education;Smoking cessation;Understanding Cancer/ Treatment Options;Other    ?Interventions Coordination of Care Coordination of Care;Education;Psycho-Social Support Education;Coordination of Care  Psycho-Social Support;Education Coordination of Care Coordination of Care  ?Acuity Level 2-Minimal Needs (1-2 Barriers Identified) Level 3-Moderate  Needs (3-4 Barriers Identified) Level 3-Moderate Needs (3-4 Barriers Identified) Level 2-Minimal Needs (1-2 Barriers Identified) Level 3-Moderate Needs (3-4 Barriers Identified) Level 2-Minimal Needs (1-2 Barriers Identified) Level 2-Minimal Needs (1-2 Barriers Identified)  ?Coordination of Care Other Appts Other    Appts  ?Education Method  Verbal Verbal  Verbal;Other  Verbal  ?Time Spent with Patient 91 [phone]  ?  ?

## 2021-07-28 ENCOUNTER — Encounter: Payer: Self-pay | Admitting: Internal Medicine

## 2021-07-28 NOTE — Progress Notes (Deleted)
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 The patient underwent SRS to the solitary brain metastasis  Molecular studies by [employer] showed positive KRAS G12C mutation and PD-L1 expression was 75%.  PRIOR THERAPY: SRS to the solitary brain metastasis under the care of Dr. Tammi Klippel   CURRENT THERAPY:  Single agent immunotherapy with Libtayo (Cempilimab), first dose expected on ***  INTERVAL HISTORY: Sandra Horne 66 y.o. female returns to the clinic today for a follow up visit. The patient was first seen in the clinic on 06/02/21. She was initially seen to have suspicious with stage IV lung cancer initially in Fall 2023 but was lost to follow up for further work up. She did eventually receive SRS to the metastatic brain lesions and was seen by Dr. Julien Nordmann on 06/02/21. She was supposed to start single agent immunotherapy with Libtayo on 06/09/21 but she did not show up to her appointment. She was called and it turns out she went out of state to get married. She has returned at this time to start treatment.   She is here to consider resuming her treatment. She denies any fevers, chill, night sweats, weight loss. She denies chest pain, shortness of breath, or hemoptysis. She denies nausea, vomiting, diarrhea, or constipation. She denies headaches or visual changes. She was supposed to have a repeat PET scan to restage her disease, however, this has not been performed. She is here for evaluation and repeat blood work before starting cycle #1.   MEDICAL HISTORY:No past medical history on file.  ALLERGIES:  has No Known Allergies.  MEDICATIONS:  Current Outpatient Medications  Medication Sig Dispense  Refill   prochlorperazine (COMPAZINE) 10 MG tablet Take 1 tablet (10 mg total) by mouth every 6 (six) hours as needed for nausea or vomiting. 30 tablet 0   No current facility-administered medications for this visit.    SURGICAL HISTORY:  Past Surgical History:  Procedure Laterality Date   BRONCHIAL BRUSHINGS  03/26/2021   Procedure: BRONCHIAL BRUSHINGS;  Surgeon: Margaretha Seeds, MD;  Location: [facility];  Service: Cardiopulmonary;;   BRONCHIAL NEEDLE ASPIRATION BIOPSY  03/26/2021   Procedure: BRONCHIAL NEEDLE ASPIRATION BIOPSIES;  Surgeon: Margaretha Seeds, MD;  Location: [facility];  Service: Cardiopulmonary;;   BRONCHIAL WASHINGS  03/26/2021   Procedure: BRONCHIAL WASHINGS;  Surgeon: Margaretha Seeds, MD;  Location: [facility];  Service: Cardiopulmonary;;   ECTOPIC PREGNANCY SURGERY     ENDOBRONCHIAL ULTRASOUND Bilateral 03/26/2021   Procedure: ENDOBRONCHIAL ULTRASOUND;  Surgeon: Margaretha Seeds, MD;  Location: [facility];  Service: Cardiopulmonary;  Laterality: Bilateral;   HEMOSTASIS CONTROL  03/26/2021   Procedure: HEMOSTASIS CONTROL;  Surgeon: Margaretha Seeds, MD;  Location: [facility];  Service: Cardiopulmonary;;    REVIEW OF SYSTEMS:   Review of Systems  Constitutional: Negative for appetite change, chills, fatigue, fever and unexpected weight change.  HENT:   Negative for mouth sores, nosebleeds, sore throat and trouble swallowing.   Eyes: Negative for eye problems and icterus.  Respiratory: Negative for cough, hemoptysis, shortness of breath and wheezing.   Cardiovascular: Negative for chest pain and leg swelling.  Gastrointestinal: Negative for abdominal pain, constipation, diarrhea, nausea and vomiting.  Genitourinary: Negative for bladder incontinence, difficulty urinating, dysuria, frequency  and hematuria.   Musculoskeletal: Negative for back pain, gait problem, neck pain and neck stiffness.  Skin: Negative for itching and rash.  Neurological:  Negative for dizziness, extremity weakness, gait problem, headaches, light-headedness and seizures.  Hematological: Negative for adenopathy. Does not bruise/bleed easily.  Psychiatric/Behavioral: Negative for confusion, depression and sleep disturbance. The patient is not nervous/anxious.     PHYSICAL EXAMINATION:  There were no vitals taken for this visit.  ECOG PERFORMANCE STATUS: {CHL ONC ECOG [id number]  Physical Exam  Constitutional: Oriented to person, place, and time and well-developed, well-nourished, and in no distress. No distress.  HENT:  Head: Normocephalic and atraumatic.  Mouth/Throat: Oropharynx is clear and moist. No oropharyngeal exudate.  Eyes: Conjunctivae are normal. Right eye exhibits no discharge. Left eye exhibits no discharge. No scleral icterus.  Neck: Normal range of motion. Neck supple.  Cardiovascular: Normal rate, regular rhythm, normal heart sounds and intact distal pulses.   Pulmonary/Chest: Effort normal and breath sounds normal. No respiratory distress. No wheezes. No rales.  Abdominal: Soft. Bowel sounds are normal. Exhibits no distension and no mass. There is no tenderness.  Musculoskeletal: Normal range of motion. Exhibits no edema.  Lymphadenopathy:    No cervical adenopathy.  Neurological: Alert and oriented to person, place, and time. Exhibits normal muscle tone. Gait normal. Coordination normal.  Skin: Skin is warm and dry. No rash noted. Not diaphoretic. No erythema. No pallor.  Psychiatric: Mood, memory and judgment normal.  Vitals reviewed.  LABORATORY DATA: Lab Results  Component Value Date   WBC 8.2 06/02/2021   HGB 10.4 (L) 06/02/2021   HCT 31.3 (L) 06/02/2021   MCV 89.4 06/02/2021   PLT 337 06/02/2021      Chemistry      Component Value Date/Time   NA 140 06/02/2021 1342   K 4.3 06/02/2021 1342   CL 108 06/02/2021 1342   CO2 29 06/02/2021 1342   BUN 5 (L) 06/02/2021 1342   CREATININE 0.68 06/02/2021 1342      Component  Value Date/Time   CALCIUM 8.7 (L) 06/02/2021 1342   ALKPHOS 92 06/02/2021 1342   AST 17 06/02/2021 1342   ALT 25 06/02/2021 1342   BILITOT 0.5 06/02/2021 1342       RADIOGRAPHIC STUDIES:  No results found.   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 Molecular studies by [id number] showed positive KRAS G12C mutation and PD-L1 expression was 75%. The patient underwent SRS to the solitary brain metastasis under the care of Dr. Tammi Klippel.  Dr. Julien Nordmann had recommended restaging her disease with a PET scan at her last visit. This has not been performed yet.   The patient was seen with Dr. Julien Nordmann. Labs were reviewed. Recommend that she ***.   She is currently undergoing treatment with Libtayo due to her immunotherapy expression of 75%   She also has the option of treatment with Lumakras (Sotorasib) or [employer]) if she developed disease progression on the frontline therapy.  Labs were reviewed. Recommend that she *** with cycle #1 today as scheduled.   Importance of keeping appointments***  We will see her back for a follow up in 3 weeks for evaluation and repeat blood work before starting cycle #2.   The patient  was advised to call immediately if she has any concerning symptoms in the interval. The patient voices understanding of current disease status and treatment options and is in agreement with the current care plan. All questions were answered. The patient knows to call the clinic with any problems, questions or concerns. We can certainly see the patient much sooner if necessary   No orders of the defined types were placed in this encounter.    I spent {CHL  ONC TIME VISIT - NVBTY:[id number]} counseling the patient face to face. The total time spent in the appointment was {CHL ONC TIME VISIT - FSFSE:[id number]}.  Saleemah Mollenhauer L Aedin Jeansonne, PA-C 07/28/21

## 2021-07-29 ENCOUNTER — Encounter: Payer: Self-pay | Admitting: Internal Medicine

## 2021-07-29 ENCOUNTER — Ambulatory Visit: Payer: Self-pay | Admitting: Urology

## 2021-07-30 ENCOUNTER — Encounter: Payer: Self-pay | Admitting: *Deleted

## 2021-07-30 ENCOUNTER — Ambulatory Visit (HOSPITAL_COMMUNITY): Admission: RE | Admit: 2021-07-30 | Payer: Medicaid Other | Source: Ambulatory Visit

## 2021-07-30 NOTE — Progress Notes (Signed)
Oncology Nurse Navigator Documentation ? ? ?  07/30/2021  ?  1:00 PM 07/27/2021  ?  3:00 PM 07/13/2021  ?  1:00 PM 07/10/2021  ?  1:00 PM 07/08/2021  ?  3:00 PM 06/02/2021  ?  3:00 PM 05/29/2021  ?  4:00 PM  ?Oncology Nurse Navigator Flowsheets  ?Abnormal Finding Date      12/13/2019   ?Confirmed Diagnosis Date      03/26/2021   ?Diagnosis Status      Confirmed Diagnosis Complete   ?Planned Course of Treatment      Radiation;Targeted Therapy   ?Phase of Treatment      Targeted Therapy   ?Radiation Actual Start Date:      03/27/2021   ?Radiation Actual End Date:      04/08/2021   ?Navigator Follow Up Date:   08/03/2021 07/27/2021 07/10/2021 06/04/2021   ?Navigator Follow Up Reason:   Follow-up Appointment Patient Call Patient Call Appointment Review   ?[facility] [facility]  ?Navigator Encounter Type Appt/Treatment Plan Review/I followed up on Ms. Smith's treatment plan. She is scheduled for her plan of care.  Will follow up with her.  Appt/Treatment Plan Review Telephone Telephone Telephone Clinic/MDC;Initial MedOnc Telephone  ?Telephone   Outgoing Call [place] Call [place] Call  [place] Call  ?Treatment Initiated Date      03/27/2021   ?Patient Visit Type      Initial;MedOnc   ?Treatment Phase Pre-Tx/Tx Discussion Pre-Tx/Tx Discussion Other Pre-Tx/Tx Discussion Pre-Tx/Tx Discussion Pre-Tx/Tx Discussion   ?Barriers/Navigation Needs Coordination of Care Coordination of Care Coordination of Care;Education Education;Coordination of Care  Education Coordination of Care  ?Education   Other Other  Newly Diagnosed Cancer Education;Smoking cessation;Understanding Cancer/ Treatment Options;Other   ?Interventions Coordination of Care Coordination of Care Coordination of Care;Education;Psycho-Social Support Education;Coordination of Care  Psycho-Social Support;Education Coordination of Care  ?Acuity Level 2-Minimal Needs (1-2  Barriers Identified) Level 2-Minimal Needs (1-2 Barriers Identified) Level 3-Moderate Needs (3-4 Barriers Identified) Level 3-Moderate Needs (3-4 Barriers Identified) Level 2-Minimal Needs (1-2 Barriers Identified) Level 3-Moderate Needs (3-4 Barriers Identified) Level 2-Minimal Needs (1-2 Barriers Identified)  ?Coordination of Care Other Other Appts Other     ?Education Method   Verbal Verbal  Verbal;Other   ?Time Spent with Patient 16 [phone]  ?  ?

## 2021-07-31 ENCOUNTER — Ambulatory Visit (HOSPITAL_COMMUNITY): Admission: RE | Admit: 2021-07-31 | Payer: Medicaid Other | Source: Ambulatory Visit

## 2021-08-03 ENCOUNTER — Inpatient Hospital Stay: Payer: Medicaid Other | Attending: Physician Assistant | Admitting: Physician Assistant

## 2021-08-03 ENCOUNTER — Encounter: Payer: Self-pay | Admitting: *Deleted

## 2021-08-03 ENCOUNTER — Inpatient Hospital Stay: Payer: Medicaid Other

## 2021-08-03 NOTE — Progress Notes (Signed)
I received a message that [name] did not show for her appt with Med Onc. Per Dr. Julien Nordmann request, I reached out to leadership for assistance to refer to another provider or discharge.  Wait for responds.  ?

## 2021-08-05 ENCOUNTER — Telehealth: Payer: Self-pay | Admitting: Radiation Therapy

## 2021-08-05 NOTE — Telephone Encounter (Signed)
Tried calling patient and her daughter about the missed brain MRI appointment. There was no answer and I was unable to leave a message on either line.  ? ?Mont Dutton R.T.(R)(T) ?Radiation Special Procedures Navigator  ?

## 2021-08-06 ENCOUNTER — Telehealth: Payer: Self-pay

## 2021-08-06 ENCOUNTER — Encounter: Payer: Self-pay | Admitting: Internal Medicine

## 2021-08-06 NOTE — Telephone Encounter (Signed)
Telephone appointment reminder. I called all numbers on file for patient in reference to patient's 10:00am-08/07/21 telephone appointment w/ [name] Bruning PA-C. No answer and no voicemail for any of the numbers listed. ?

## 2021-08-06 NOTE — Progress Notes (Deleted)
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 The patient underwent SRS to the solitary brain metastasis  Molecular studies by [id number] showed positive KRAS G12C mutation and PD-L1 expression was 75%.  PRIOR THERAPY: SRS to the solitary brain metastasis under the care of Dr. Tammi Klippel   CURRENT THERAPY:  Single agent immunotherapy with Libtayo (Cempilimab), first dose expected on ***  INTERVAL HISTORY: Sandra Horne 66 y.o. female returns to the clinic today for a follow up visit. The patient was first seen in the clinic on 06/02/21. She was initially seen to have suspicious with stage IV lung cancer initially in Fall 2023 but was lost to follow up for further work up. She did eventually receive SRS to the metastatic brain lesions and was seen by Dr. Julien Nordmann on 06/02/21. She was supposed to start single agent immunotherapy with Libtayo on 06/09/21 but she did not show up to her appointment. She was called and it turns out she went out of state to get married. She has returned at this time to start treatment.   She is here to consider resuming her treatment. She denies any fevers, chill, night sweats, weight loss. She denies chest pain, shortness of breath, or hemoptysis. She denies nausea, vomiting, diarrhea, or constipation. She denies headaches or visual changes. She was supposed to have a repeat PET scan to restage her disease, however, this has not been performed. She is here for evaluation and repeat blood work before starting cycle #1.   MEDICAL HISTORY:No past medical history on file.  ALLERGIES:  has No  Known Allergies.  MEDICATIONS:  Current Outpatient Medications  Medication Sig Dispense Refill   prochlorperazine (COMPAZINE) 10 MG tablet Take 1 tablet (10 mg total) by mouth every 6 (six) hours as needed for nausea or vomiting. 30 tablet 0   No current facility-administered medications for this visit.    SURGICAL HISTORY:  Past Surgical History:  Procedure Laterality Date   BRONCHIAL BRUSHINGS  03/26/2021   Procedure: BRONCHIAL BRUSHINGS;  Surgeon: Margaretha Seeds, MD;  Location: [facility];  Service: Cardiopulmonary;;   BRONCHIAL NEEDLE ASPIRATION BIOPSY  03/26/2021   Procedure: BRONCHIAL NEEDLE ASPIRATION BIOPSIES;  Surgeon: Margaretha Seeds, MD;  Location: [facility];  Service: Cardiopulmonary;;   BRONCHIAL WASHINGS  03/26/2021   Procedure: BRONCHIAL WASHINGS;  Surgeon: Margaretha Seeds, MD;  Location: [facility];  Service: Cardiopulmonary;;   ECTOPIC PREGNANCY SURGERY     ENDOBRONCHIAL ULTRASOUND Bilateral 03/26/2021   Procedure: ENDOBRONCHIAL ULTRASOUND;  Surgeon: Margaretha Seeds, MD;  Location: [facility];  Service: Cardiopulmonary;  Laterality: Bilateral;   HEMOSTASIS CONTROL  03/26/2021   Procedure: HEMOSTASIS CONTROL;  Surgeon: Margaretha Seeds, MD;  Location: [facility];  Service: Cardiopulmonary;;    REVIEW OF SYSTEMS:   Review of Systems  Constitutional: Negative for appetite change, chills, fatigue, fever and unexpected weight change.  HENT:   Negative for mouth sores, nosebleeds, sore throat and trouble swallowing.   Eyes: Negative for eye problems and icterus.  Respiratory: Negative for cough, hemoptysis, shortness of breath and wheezing.   Cardiovascular: Negative for chest pain and leg swelling.  Gastrointestinal: Negative for abdominal  pain, constipation, diarrhea, nausea and vomiting.  Genitourinary: Negative for bladder incontinence, difficulty urinating, dysuria, frequency and hematuria.   Musculoskeletal: Negative for back pain, gait  problem, neck pain and neck stiffness.  Skin: Negative for itching and rash.  Neurological: Negative for dizziness, extremity weakness, gait problem, headaches, light-headedness and seizures.  Hematological: Negative for adenopathy. Does not bruise/bleed easily.  Psychiatric/Behavioral: Negative for confusion, depression and sleep disturbance. The patient is not nervous/anxious.     PHYSICAL EXAMINATION:  There were no vitals taken for this visit.  ECOG PERFORMANCE STATUS: {CHL ONC ECOG [id number]  Physical Exam  Constitutional: Oriented to person, place, and time and well-developed, well-nourished, and in no distress. No distress.  HENT:  Head: Normocephalic and atraumatic.  Mouth/Throat: Oropharynx is clear and moist. No oropharyngeal exudate.  Eyes: Conjunctivae are normal. Right eye exhibits no discharge. Left eye exhibits no discharge. No scleral icterus.  Neck: Normal range of motion. Neck supple.  Cardiovascular: Normal rate, regular rhythm, normal heart sounds and intact distal pulses.   Pulmonary/Chest: Effort normal and breath sounds normal. No respiratory distress. No wheezes. No rales.  Abdominal: Soft. Bowel sounds are normal. Exhibits no distension and no mass. There is no tenderness.  Musculoskeletal: Normal range of motion. Exhibits no edema.  Lymphadenopathy:    No cervical adenopathy.  Neurological: Alert and oriented to person, place, and time. Exhibits normal muscle tone. Gait normal. Coordination normal.  Skin: Skin is warm and dry. No rash noted. Not diaphoretic. No erythema. No pallor.  Psychiatric: Mood, memory and judgment normal.  Vitals reviewed.  LABORATORY DATA: Lab Results  Component Value Date   WBC 8.2 06/02/2021   HGB 10.4 (L) 06/02/2021   HCT 31.3 (L) 06/02/2021   MCV 89.4 06/02/2021   PLT 337 06/02/2021      Chemistry      Component Value Date/Time   NA 140 06/02/2021 1342   K 4.3 06/02/2021 1342   CL 108 06/02/2021 1342   CO2 29  06/02/2021 1342   BUN 5 (L) 06/02/2021 1342   CREATININE 0.68 06/02/2021 1342      Component Value Date/Time   CALCIUM 8.7 (L) 06/02/2021 1342   ALKPHOS 92 06/02/2021 1342   AST 17 06/02/2021 1342   ALT 25 06/02/2021 1342   BILITOT 0.5 06/02/2021 1342       RADIOGRAPHIC STUDIES:  No results found.   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 Molecular studies by [id number] showed positive KRAS G12C mutation and PD-L1 expression was 75%. The patient underwent SRS to the solitary brain metastasis under the care of Dr. Tammi Klippel.  Dr. Julien Nordmann had recommended restaging her disease with a PET scan at her last visit. This has not been performed yet.   The patient was seen with Dr. Julien Nordmann. Labs were reviewed. Recommend that she ***.   She is currently undergoing treatment with Libtayo due to her immunotherapy expression of 75%   She also has the option of treatment with Lumakras (Sotorasib) or [employer]) if she developed disease progression on the frontline therapy.  Labs were reviewed. Recommend that she *** with cycle #1 today as scheduled.   Importance of keeping appointments***  We will see her back for a follow up in  3 weeks for evaluation and repeat blood work before starting cycle #2.   The patient was advised to call immediately if she has any concerning symptoms in the interval. The patient voices understanding of current disease status and treatment options and is in agreement with the current care plan. All questions were answered. The patient knows to call the clinic with any problems, questions or concerns. We can certainly see the patient much sooner  if necessary   No orders of the defined types were placed in this encounter.    I spent {CHL ONC TIME VISIT - WUGQB:[id number]} counseling the patient face to face. The total time spent in the appointment was {CHL ONC TIME VISIT - KCMKL:[id number]}.  Cassandra L Heilingoetter, PA-C 08/06/21

## 2021-08-07 ENCOUNTER — Ambulatory Visit: Payer: Medicaid Other | Admitting: Urology

## 2021-08-07 ENCOUNTER — Telehealth: Payer: Self-pay

## 2021-08-07 ENCOUNTER — Encounter: Payer: Self-pay | Admitting: Radiation Therapy

## 2021-08-07 ENCOUNTER — Ambulatory Visit: Payer: No Typology Code available for payment source | Admitting: Urology

## 2021-08-07 NOTE — Telephone Encounter (Signed)
Called each contact on file for patient. No answer and no voicemail options on any of the numbers. Unable to reach patient for today's 08/07/21-10:00am telephone appointment w/ [name] Bruning PA-C.  ?

## 2021-08-07 NOTE — Progress Notes (Signed)
Due to multiple failed attempts to reach the patient or family on the phone, I have mailed out a card including my contact information to the address on file for Sandra Horne. I have requested her contact me to let us know if she is interested in continuing follow-up for her treated brain disease. She has rescheduled and no showed multiple MRI and follow-up appointments.   Mont Dutton R.T.(R)(T) Radiation Special Procedure Navigator

## 2021-08-10 ENCOUNTER — Encounter: Payer: Self-pay | Admitting: *Deleted

## 2021-08-10 NOTE — Progress Notes (Signed)
Oncology Nurse Navigator Documentation ? ? ?  08/10/2021  ? 12:00 PM 07/30/2021  ?  1:00 PM 07/27/2021  ?  3:00 PM 07/13/2021  ?  1:00 PM 07/10/2021  ?  1:00 PM 07/08/2021  ?  3:00 PM 06/02/2021  ?  3:00 PM  ?Oncology Nurse Navigator Flowsheets  ?Abnormal Finding Date       12/13/2019  ?Confirmed Diagnosis Date       03/26/2021  ?Diagnosis Status       Confirmed Diagnosis Complete  ?Planned Course of Treatment       Radiation;Targeted Therapy  ?Phase of Treatment       Targeted Therapy  ?Radiation Actual Start Date:       03/27/2021  ?Radiation Actual End Date:       04/08/2021  ?Navigator Follow Up Date:    08/03/2021 07/27/2021 07/10/2021 06/04/2021  ?Navigator Follow Up Reason:    Follow-up Appointment Patient Call Patient Call Appointment Review  ?Navigator Location [facility]  ?Navigator Encounter Type Telephone Appt/Treatment Plan Review Appt/Treatment Plan Review Telephone Telephone Telephone Clinic/MDC;Initial MedOnc  ?Telephone Outgoing Call/Sandra Horne got re-scheduled by someone at the office to have tx tomorrow. I called to reminder her of appt but unable to reach nor leave a vm message.     Outgoing Call [place] [facility] [place] Call   ?Treatment Initiated Date       03/27/2021  ?Patient Visit Type       Initial;MedOnc  ?Treatment Phase  Pre-Tx/Tx Discussion Pre-Tx/Tx Discussion Other Pre-Tx/Tx Discussion Pre-Tx/Tx Discussion Pre-Tx/Tx Discussion  ?Barriers/Navigation Needs Education Coordination of Care Coordination of Care Coordination of Care;Education Education;Coordination of Care  Education  ?Education    Other Other  Newly Diagnosed Cancer Education;Smoking cessation;Understanding Cancer/ Treatment Options;Other  ?Interventions Education Coordination of Care Coordination of Care Coordination of Care;Education;Psycho-Social Support Education;Coordination of Care  Psycho-Social Support;Education  ?Acuity Level  2-Minimal Needs (1-2 Barriers Identified) Level 2-Minimal Needs (1-2 Barriers Identified) Level 2-Minimal Needs (1-2 Barriers Identified) Level 3-Moderate Needs (3-4 Barriers Identified) Level 3-Moderate Needs (3-4 Barriers Identified) Level 2-Minimal Needs (1-2 Barriers Identified) Level 3-Moderate Needs (3-4 Barriers Identified)  ?Coordination of Care  Other Other Appts Other    ?Education Method    Verbal Verbal  Verbal;Other  ?Time Spent with Patient 15 30 30  60 45 15 45  ?  ?

## 2021-08-11 ENCOUNTER — Inpatient Hospital Stay: Payer: Medicaid Other

## 2021-08-11 ENCOUNTER — Inpatient Hospital Stay: Payer: Medicaid Other | Admitting: Physician Assistant

## 2021-08-21 ENCOUNTER — Encounter: Payer: Self-pay | Admitting: *Deleted

## 2021-08-21 NOTE — Progress Notes (Signed)
I called patient's daughter per EMR request to schedule Sandra Horne to see Dr. Julien Nordmann. I was unable to reach nor leave vm message.

## 2021-08-22 ENCOUNTER — Ambulatory Visit (HOSPITAL_COMMUNITY)
Admission: RE | Admit: 2021-08-22 | Discharge: 2021-08-22 | Disposition: A | Discharge status: Home / Self Care | Payer: Medicaid Other | Source: Ambulatory Visit | Attending: Radiation Oncology | Admitting: Radiation Oncology

## 2021-08-22 DIAGNOSIS — C7931 Secondary malignant neoplasm of brain: Secondary | ICD-10-CM | POA: Insufficient documentation

## 2021-08-22 DIAGNOSIS — G9389 Other specified disorders of brain: Secondary | ICD-10-CM | POA: Diagnosis not present

## 2021-08-22 IMAGING — MR MR HEAD WO/W CM
11 of 14 series · 23 of 48 positions shown · IV contrast (7ML GADAVIST)
Comparison: [DATE]

CLINICAL DATA: Brain metastases, assess treatment response [REDACTED]
Protocol

EXAM:
MRI HEAD WITHOUT AND WITH CONTRAST
TECHNIQUE: Multiplanar, multiecho pulse sequences of the brain and surrounding
structures were obtained without and with intravenous contrast.
CONTRAST:  7mL GADAVIST GADOBUTROL 1 MMOL/ML IV SOLN

[Series 2: FLAIR · sagittal · 3.0mm · 0.47mm/px · 1 of 34 slices shown (1 of 2)]
[im 1/34]
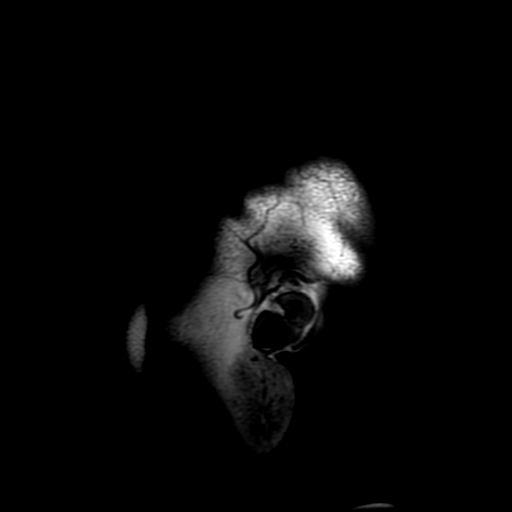

[Series 3: DWI · axial · 3.0mm · 0.94mm/px · z∈[-70,+85]mm · 2 of 106 slices shown (1 of 2)]
[im 1/106]
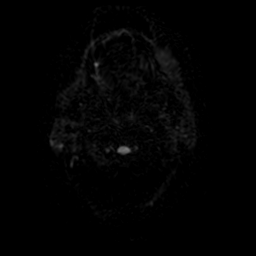
[im 106/106]
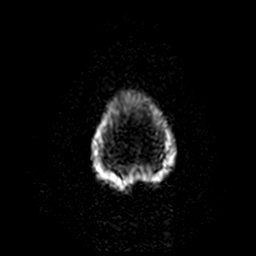

[Series 4: FLAIR · axial · 3.0mm · 0.47mm/px · 1 of 51 slices shown (2 of 2)]
[im 1/51]
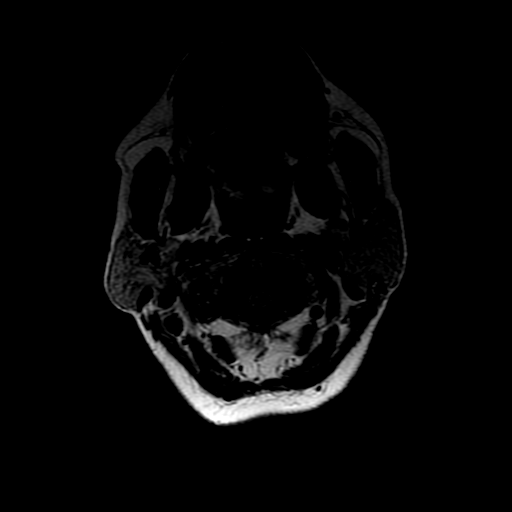

[Series 5: SWI · axial · 2.9mm · 0.47mm/px · z∈[-68,+96]mm · 3 of 110 slices shown]
[im 1/110]
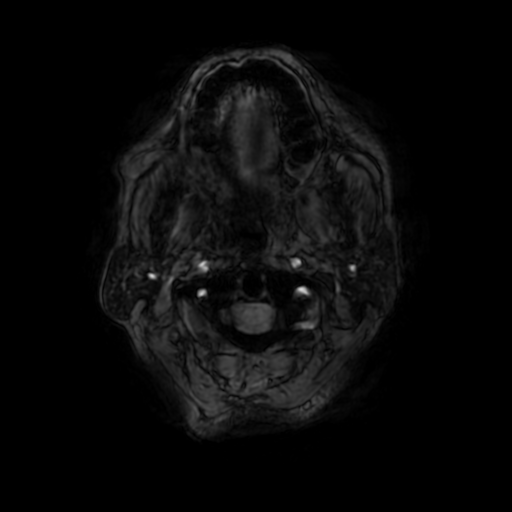
[im 55/110]
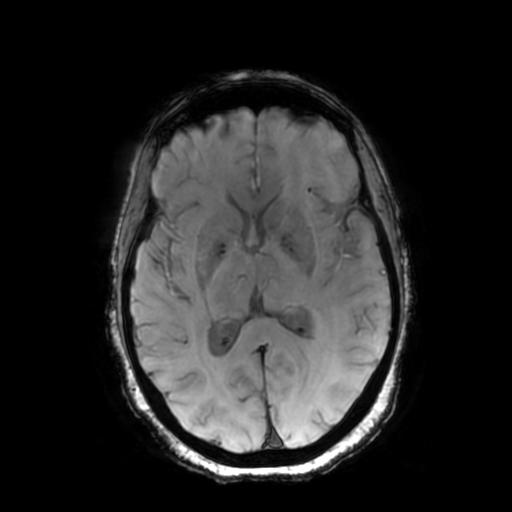
[im 110/110]
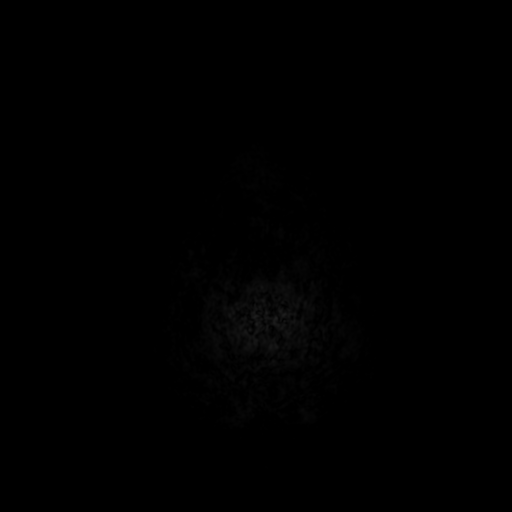

[Series 7: T2 post-contrast · coronal · 3.0mm · 0.39mm/px · 1 of 45 slices shown (1 of 2)]
[im 1/45]
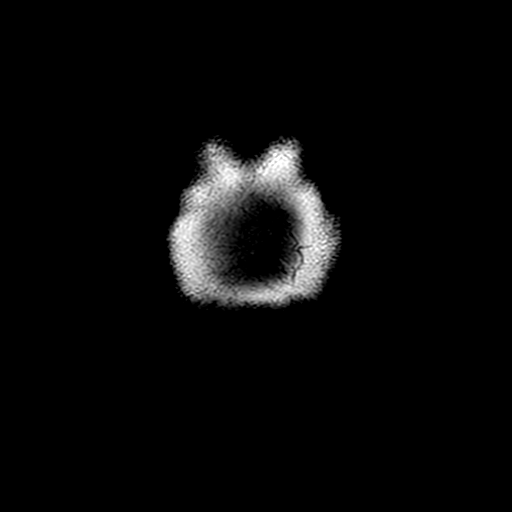

[Series 8: T2 post-contrast · axial · 5.0mm · 0.47mm/px · 1 of 27 slices shown (2 of 2)]
[im 1/27]
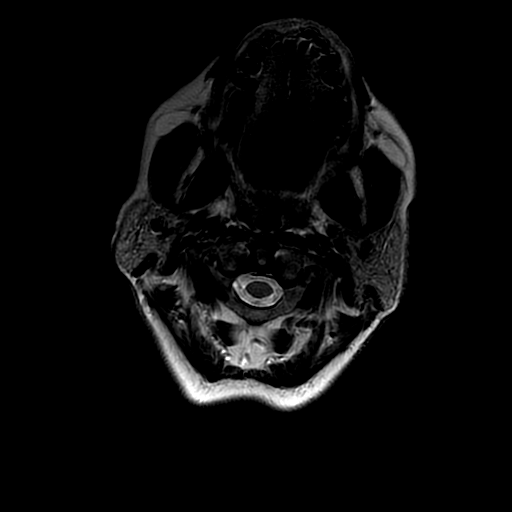

[Series 9: T1 post-contrast · coronal · 3.0mm · 0.39mm/px · 1 of 45 slices shown (1 of 2)]
[im 1/45]
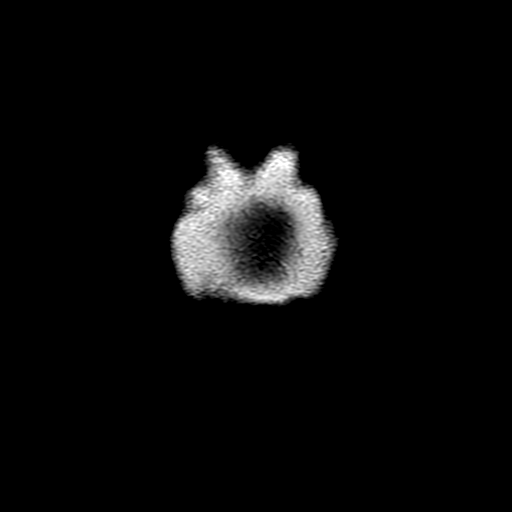

[Series 10: FLAIR post-contrast · sagittal · 3.0mm · 0.47mm/px · 1 of 34 slices shown]
[im 1/34]
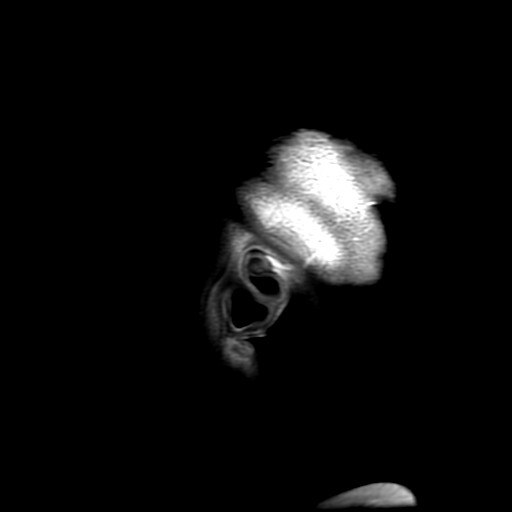

[Series 310: DWI · axial · 3.0mm · 0.94mm/px · z∈[-70,+85]mm · 3 of 106 slices shown (2 of 2)]
[im 1/106]
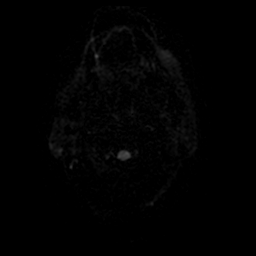
[im 53/106]
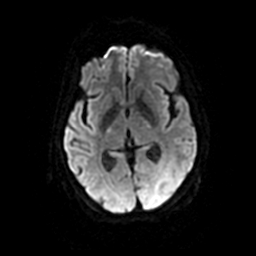
[im 106/106]
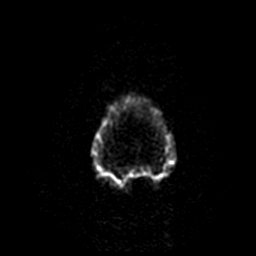

[Series 350: ADC · axial · 3.0mm · 0.94mm/px · 1 of 53 slices shown]
[im 1/53]
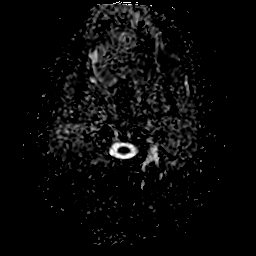

[Series 1100: T1 post-contrast · axial · 0.9mm · 0.50mm/px · z∈[-132,+123]mm · 8 of 300 slices shown (2 of 2)]
[im 1/300]
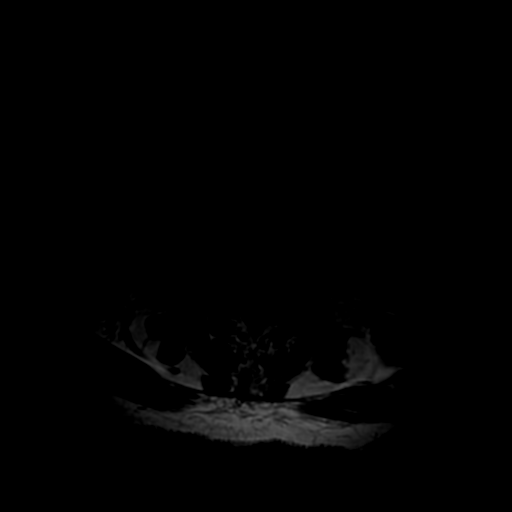
[im 43/300]
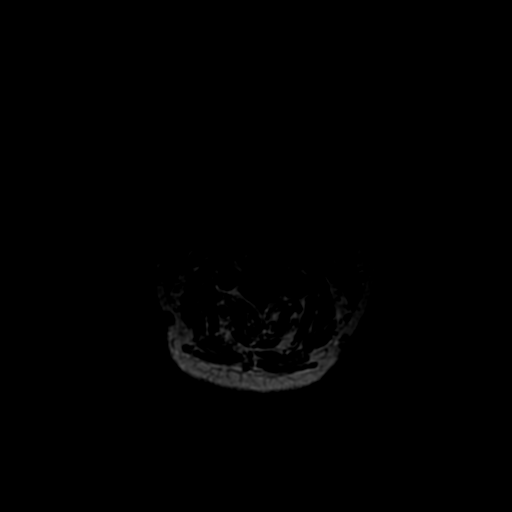
[im 86/300]
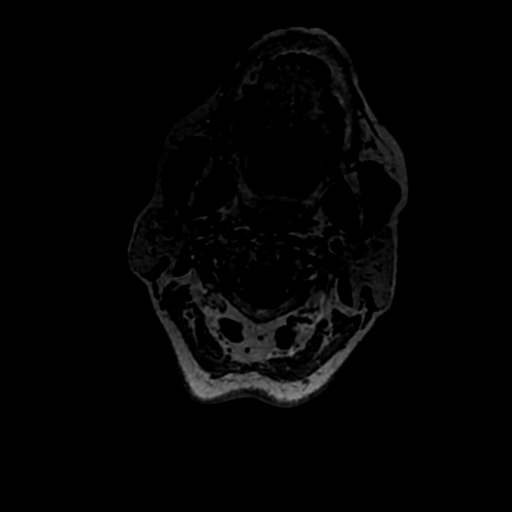
[im 129/300]
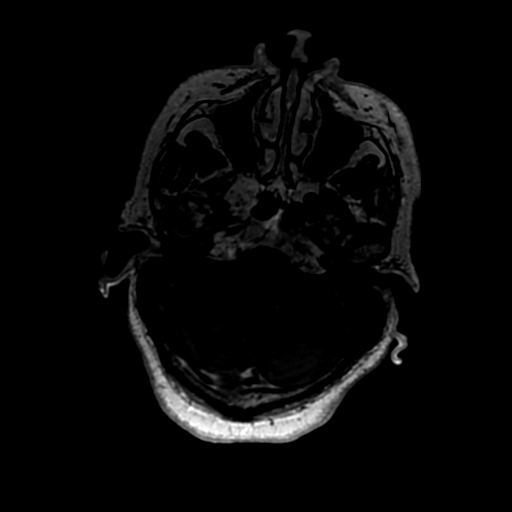
[im 171/300]
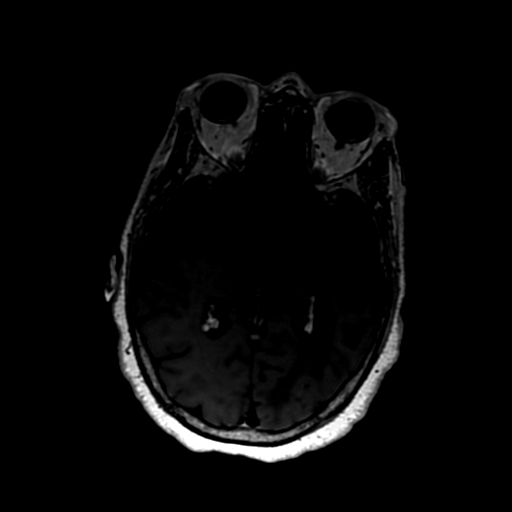
[im 214/300]
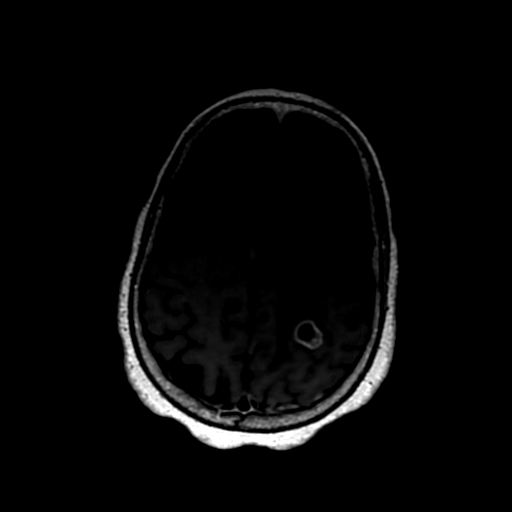
[im 257/300]
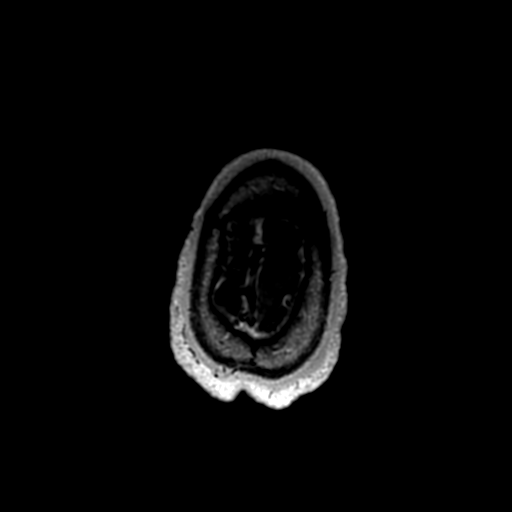
[im 300/300]
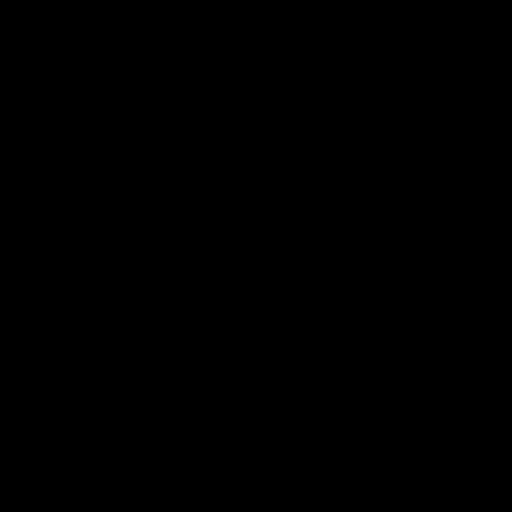

[23 of 48 positions shown; findings below may reference images not displayed]

FINDINGS: Brain: Left occipital lesion has contracted now measuring about
x 1.3 x 1.5 cm (previously 2.2 x 2 x 2.6 cm). Surrounding edema
remains present but has decreased.

New left parietal peripherally enhancing lesion measuring 1.3 x
cm (series [LS], image 19). There is associated edema.

No acute infarction. Mild regional mass effect associated with
above. Additional patchy foci of T2 hyperintensity in the
supratentorial white matter may reflect stable chronic microvascular
ischemic changes. No hydrocephalus or extra-axial collection.

Vascular: Major vessel flow voids at the skull base are preserved.

Skull and upper cervical spine: Normal marrow signal is preserved.

Sinuses/Orbits: Paranasal sinuses are aerated. Orbits are
unremarkable.

Other: Sella is unremarkable.  Mastoid air cells are clear.
IMPRESSION: Decrease in size of left occipital metastasis with decreased
surrounding edema.

New left parietal metastasis with surrounding edema.

Residual mild regional mass effect.

## 2021-08-22 MED ORDER — GADOBUTROL 1 MMOL/ML IV SOLN
7.0000 mL | Freq: Once | INTRAVENOUS | Status: AC | PRN
  Administered 2021-08-22: 7 mL via INTRAVENOUS

## 2021-08-25 ENCOUNTER — Encounter: Payer: Self-pay | Admitting: Urology

## 2021-08-25 NOTE — Progress Notes (Incomplete)
Radiation Oncology         [phone] ________________________________  Name: Sandra Horne MRN: [id number]  Date: 08/26/2021  DOB: April 25, 1955  Post Treatment Note  CC: Pcp, No  Derek Jack, MD  Diagnosis:   66 yo woman with a new left parietal Brain Metastasis from NSCLC, adenocarcinoma of the left lower lobe of the lung  Interval Since Last Radiation:   4 months  04/03/21 - 04/08/21//fractionated SRS:    Narrative:  I attempted to reach the patient to conduct her routine scheduled 1 month follow up visit via telephone to spare the patient unnecessary potential exposure in the healthcare setting during the current COVID-19 pandemic.  The patient was notified in advance and gave permission to proceed with this visit format. Unfortunately, after multiple attempts, I was unable to reach her and unable to leave a message since her VM was full.  She tolerated the [facility] treatment very well, without any ill side effects.                              On review of systems, the patient states *** She had a posttreatment MRI brain scan on 08/22/2021 which showed a good treatment response with decreased size of the left occipital lesion but there is now a new, 1.5 cm left parietal lesion with associated edema.  Unfortunately, she has not remained compliant with Dr. Earlie Server, having last seen him on 06/02/2021 with plans to obtain a PET scan for disease restaging and start a new single agent immunotherapy with Libtayo but she never s followed through with imaging or infusion and has not followed up with medical oncology since.  She was also previously followed with Dr. Delton Coombes but released from his care secondary to noncompliance. ALLERGIES:  has No Known Allergies.  Meds: Current Outpatient Medications  Medication Sig Dispense Refill   prochlorperazine (COMPAZINE) 10 MG tablet Take 1 tablet (10 mg total) by mouth every 6 (six) hours as needed for nausea or vomiting. 30 tablet 0   No  current facility-administered medications for this encounter.    Physical Findings:  vitals were not taken for this visit.  Pain Assessment Pain Score: 0-No pain/10 Unable to assess due to telephone follow up visit format.  Lab Findings: Lab Results  Component Value Date   WBC 8.2 06/02/2021   HGB 10.4 (L) 06/02/2021   HCT 31.3 (L) 06/02/2021   MCV 89.4 06/02/2021   PLT 337 06/02/2021     Radiographic Findings: MR Brain W Wo Contrast  Result Date: 08/25/2021 CLINICAL DATA:  Brain metastases, assess treatment response 3T SRS Protocol EXAM: MRI HEAD WITHOUT AND WITH CONTRAST TECHNIQUE: Multiplanar, multiecho pulse sequences of the brain and surrounding structures were obtained without and with intravenous contrast. CONTRAST:  86mL GADAVIST GADOBUTROL 1 MMOL/ML IV SOLN COMPARISON:  03/25/2021 FINDINGS: Brain: Left occipital lesion has contracted now measuring about 1.4 x 1.3 x 1.5 cm (previously 2.2 x 2 x 2.6 cm). Surrounding edema remains present but has decreased. New left parietal peripherally enhancing lesion measuring 1.3 x 1.5 cm (series 1100, image 19). There is associated edema. No acute infarction. Mild regional mass effect associated with above. Additional patchy foci of T2 hyperintensity in the supratentorial white matter may reflect stable chronic microvascular ischemic changes. No hydrocephalus or extra-axial collection. Vascular: Major vessel flow voids at the skull base are preserved. Skull and upper cervical spine: Normal marrow signal is preserved. Sinuses/Orbits: Paranasal sinuses  are aerated. Orbits are unremarkable. Other: Sella is unremarkable.  Mastoid air cells are clear. IMPRESSION: Decrease in size of left occipital metastasis with decreased surrounding edema. New left parietal metastasis with surrounding edema. Residual mild regional mass effect. Electronically Signed   By: Macy Mis M.D.   On: 08/25/2021 09:40    Impression/Plan: 15.  66 yo woman with a new left  parietal brain metastasis from NSCLC, adenocarcinoma of the left lower lobe of the lung. Today, I talked to the patient and family about the findings and workup thus far. We discussed the natural history of metastatic NSCLC and general treatment, highlighting the role of radiotherapy in the management. We reviewed the findings of her most recent MRI brain scan which shows a new, 1.5 cm left parietal lesion.  The recommendation is to proceed with a single fraction SRS treatment so we reviewed the expected acute and late side effects associated with this treatment which she is familiar with having completed a fractionated course of SRS in January 2023.  We discussed the available radiation techniques, and focused on the details and logistics of delivery. The patient and her daughter were encouraged to ask questions that were answered to their satisfaction.  At the conclusion of our conversation, the patient ***is in agreement to proceed with the recommended single fraction course of SRS to the new left parietal brain lesion.  We will begin coordinating the treatment with her neurosurgeon, Dr. Reatha Armour who will participate in the CT simulation/treatment planning as well as the [facility] treatment.      Nicholos Johns, PA-C

## 2021-08-25 NOTE — Progress Notes (Signed)
Telephone appointment. I spoke w/ pt's daughter Thurston Pounds, verified her identity and began nursing interview. Ms. Maricela Bo states that pt. Is "Doing very well." No issues reported at this time.  Meaningful use complete. Postmenopausal- NO chances of pregnancy.  Reminded [name] of her mom's 8:30am-08-26-21 telephone appointment w/ [name] Bruning PA-C. I let my extension [phone] in case patient needs anything. Ms. Maricela Bo verbalized understanding.  Patient preferred contact #[phone]-patient or [phone] -Thurston Pounds (daughter)

## 2021-08-26 ENCOUNTER — Telehealth: Payer: Self-pay | Admitting: Radiation Therapy

## 2021-08-26 ENCOUNTER — Other Ambulatory Visit: Payer: Self-pay | Admitting: Radiation Therapy

## 2021-08-26 ENCOUNTER — Ambulatory Visit
Admission: RE | Admit: 2021-08-26 | Discharge: 2021-08-26 | Disposition: A | Discharge status: Home / Self Care | Payer: Medicaid Other | Source: Ambulatory Visit | Attending: Urology | Admitting: Urology

## 2021-08-26 ENCOUNTER — Telehealth: Payer: Self-pay | Admitting: Urology

## 2021-08-26 DIAGNOSIS — C7931 Secondary malignant neoplasm of brain: Secondary | ICD-10-CM

## 2021-08-26 DIAGNOSIS — C3490 Malignant neoplasm of unspecified part of unspecified bronchus or lung: Secondary | ICD-10-CM

## 2021-08-26 NOTE — Telephone Encounter (Signed)
I attempted to reach the patient and her daughter, [name], to review the results of her recent MRI brain scan.  Unfortunately, there was no answer at either number and no available voicemail to leave a message.  I will ask Mont Dutton, our brain navigator, to continue to attempt to reach the patient and if unable, to please send a letter requesting that they call us back to review the findings on the recent MRI brain scan which indicate a new 1.5 cm left parietal lesion that warrants treatment.  Nicholos Johns, MMS, PA-C [place] [facility] [place]: [phone]  Fax: [phone]

## 2021-08-26 NOTE — Telephone Encounter (Signed)
I was able to reach Mrs. Sandra Horne on her cell phone to share the results of her recent brain MRI. She said that she had a feeling that ,"it was back", and is interested in having salvage SRS to treat the new lesion. Overall she said that she is doing well, but having decreased appetite, "foods just do not taste the same." She was happy how her treatment course went before and is thankful for the ability to do the same thing.  She will come in on Monday 6/5 for stat labs and simulation.   Mont Dutton R.T.(R)(T) Radiation Special Procedures Navigator

## 2021-08-27 ENCOUNTER — Encounter: Payer: Self-pay | Admitting: Internal Medicine

## 2021-08-31 ENCOUNTER — Ambulatory Visit: Payer: Medicaid Other | Admitting: Radiation Oncology

## 2021-08-31 ENCOUNTER — Ambulatory Visit: Payer: Medicaid Other

## 2021-08-31 ENCOUNTER — Encounter: Payer: Self-pay | Admitting: *Deleted

## 2021-08-31 ENCOUNTER — Telehealth: Payer: Self-pay | Admitting: Hematology and Oncology

## 2021-08-31 NOTE — Telephone Encounter (Signed)
.  Called patient to schedule appointment per 6/5inbasket, patient is aware of date and time.

## 2021-08-31 NOTE — Progress Notes (Signed)
Oncology Nurse Navigator Documentation     08/31/2021   10:00 AM 08/10/2021   12:00 PM 07/30/2021    1:00 PM 07/27/2021    3:00 PM 07/13/2021    1:00 PM 07/10/2021    1:00 PM 07/08/2021    3:00 PM  Oncology Nurse Navigator Flowsheets  Navigator Follow Up Date:     08/03/2021 07/27/2021 07/10/2021  Navigator Follow Up Reason:     Follow-up Appointment Patient Call Patient Call  Navigator Location [place] Encounter Type Telephone Telephone Appt/Treatment Plan Review Appt/Treatment Plan Review Telephone Telephone Telephone  Telephone Outgoing Call Outgoing Call   Outgoing Call Outgoing Call Outgoing Call  Treatment Phase   Pre-Tx/Tx Discussion Pre-Tx/Tx Discussion Other Pre-Tx/Tx Discussion Pre-Tx/Tx Discussion  Barriers/Navigation Needs Education;Coordination of Care/I called Sandra Horne to update her on the appt today for [facility] and to get her scheduled with med onc. I was able to speak to her and she states she will not be at her appt today due to her husband is in the hospital with CP. I told her that I will update the rad onc team and they will call to re-schedule.  Education Coordination of Care Coordination of Care Coordination of Care;Education Education;Coordination of Care   Education Other    Other Other   Interventions Coordination of Care;Education;Psycho-Social Support Education Coordination of Care Coordination of Care Coordination of Care;Education;Psycho-Social Support Education;Coordination of Care   Acuity Level 2-Minimal Needs (1-2 Barriers Identified) Level 2-Minimal Needs (1-2 Barriers Identified) Level 2-Minimal Needs (1-2 Barriers Identified) Level 2-Minimal Needs (1-2 Barriers Identified) Level 3-Moderate Needs (3-4 Barriers Identified) Level 3-Moderate Needs (3-4 Barriers Identified) Level 2-Minimal Needs (1-2 Barriers Identified)  Coordination of Care Other  Other Other  Appts Other   Education Method Verbal    Verbal Verbal   Time Spent with Patient 30 15 30 30  60 45 15

## 2021-08-31 NOTE — Progress Notes (Signed)
Oncology Nurse Navigator Documentation     08/31/2021   10:00 AM 08/10/2021   12:00 PM 07/30/2021    1:00 PM 07/27/2021    3:00 PM 07/13/2021    1:00 PM 07/10/2021    1:00 PM 07/08/2021    3:00 PM  Oncology Nurse Navigator Flowsheets  Navigator Follow Up Date:     08/03/2021 07/27/2021 07/10/2021  Navigator Follow Up Reason:     Follow-up Appointment Patient Call Patient Call  Navigator Location [place] Encounter Type Telephone Telephone Appt/Treatment Plan Review Appt/Treatment Plan Review Telephone Telephone Telephone  Telephone Outgoing Call Outgoing Call   Outgoing Call Outgoing Call Outgoing Call  Treatment Phase   Pre-Tx/Tx Discussion Pre-Tx/Tx Discussion Other Pre-Tx/Tx Discussion Pre-Tx/Tx Discussion  Barriers/Navigation Needs Education;Coordination of Care Education Coordination of Care Coordination of Care Coordination of Care;Education Education;Coordination of Care   Education Other    Other Other   Interventions Coordination of Care;Education;Psycho-Social Support Education Coordination of Care Coordination of Care Coordination of Care;Education;Psycho-Social Support Education;Coordination of Care   Acuity Level 2-Minimal Needs (1-2 Barriers Identified) Level 2-Minimal Needs (1-2 Barriers Identified) Level 2-Minimal Needs (1-2 Barriers Identified) Level 2-Minimal Needs (1-2 Barriers Identified) Level 3-Moderate Needs (3-4 Barriers Identified) Level 3-Moderate Needs (3-4 Barriers Identified) Level 2-Minimal Needs (1-2 Barriers Identified)  Coordination of Care Other  Other Other Appts Other   Education Method Verbal    Verbal Verbal   Time Spent with Patient 30 15 [phone] 30  60 [phone]

## 2021-09-01 ENCOUNTER — Other Ambulatory Visit: Payer: Medicaid Other

## 2021-09-01 ENCOUNTER — Ambulatory Visit: Payer: Medicaid Other

## 2021-09-01 ENCOUNTER — Ambulatory Visit: Payer: Medicaid Other | Admitting: Internal Medicine

## 2021-09-02 ENCOUNTER — Telehealth: Payer: Self-pay | Admitting: Radiation Therapy

## 2021-09-02 ENCOUNTER — Ambulatory Visit: Admission: RE | Admit: 2021-09-02 | Payer: Medicaid Other | Source: Ambulatory Visit | Admitting: Radiation Oncology

## 2021-09-02 ENCOUNTER — Ambulatory Visit: Payer: Medicaid Other

## 2021-09-02 ENCOUNTER — Inpatient Hospital Stay: Payer: Medicaid Other | Admitting: Hematology and Oncology

## 2021-09-02 ENCOUNTER — Encounter: Payer: Self-pay | Admitting: Radiation Therapy

## 2021-09-02 ENCOUNTER — Other Ambulatory Visit: Payer: Self-pay | Admitting: Radiation Therapy

## 2021-09-02 DIAGNOSIS — C3432 Malignant neoplasm of lower lobe, left bronchus or lung: Secondary | ICD-10-CM | POA: Insufficient documentation

## 2021-09-02 DIAGNOSIS — F17210 Nicotine dependence, cigarettes, uncomplicated: Secondary | ICD-10-CM | POA: Insufficient documentation

## 2021-09-02 DIAGNOSIS — C7931 Secondary malignant neoplasm of brain: Secondary | ICD-10-CM | POA: Insufficient documentation

## 2021-09-02 NOTE — Telephone Encounter (Signed)
I called to check in on patient when she did not arrive for her scheduled appointments today. There was no answer for several attempts on the patients phone, and unable to leave a message because the mailbox is full. I also called the spouse's phone, unable to leave a message due to a full mailbox, and the daughter's phone. The daughter answered but did not speak and hung up the call. Further attempts were not answered and unable to leave a message on her line either.   Skip Mayer (R)(T) Radiation Special Procedures Navigator

## 2021-09-03 ENCOUNTER — Ambulatory Visit: Payer: Medicaid Other | Admitting: Radiation Oncology

## 2021-09-03 ENCOUNTER — Encounter: Payer: Self-pay | Admitting: Licensed Clinical Social Worker

## 2021-09-03 ENCOUNTER — Telehealth: Payer: Self-pay | Admitting: Radiation Therapy

## 2021-09-03 DIAGNOSIS — C3490 Malignant neoplasm of unspecified part of unspecified bronchus or lung: Secondary | ICD-10-CM

## 2021-09-03 NOTE — Progress Notes (Signed)
[place] [facility]  Initial Assessment   Sandra Horne is a 66 y.o. year old female contacted by phone. Clinical Social Work was referred by nurse for assessment of psychosocial needs.   SDOH (Social Determinants of Health) assessments performed: Yes   SDOH Screenings   Alcohol Screen: Not on file  Depression (FXT0-2): Not on file  Financial Resource Strain: Not on file  Food Insecurity: Not on file  Housing: Not on file  Physical Activity: Inactive (01/14/2020)   Exercise Vital Sign    Days of Exercise per Week: 0 days    Minutes of Exercise per Session: 0 min  Social Connections: Socially Isolated (01/14/2020)   Social Connection and Isolation Panel [NHANES]    Frequency of Communication with Friends and Family: More than three times a week    Frequency of Social Gatherings with Friends and Family: Never    Attends Religious Services: Never    Marine scientist or Organizations: No    Attends Archivist Meetings: Never    Marital Status: Widowed  Stress: Not on file  Tobacco Use: High Risk (09/02/2021)   Patient History    Smoking Tobacco Use: Some Days    Smokeless Tobacco Use: Never    Passive Exposure: Not on file  Transportation Needs: Not on file     Distress Screen completed: No    08/25/2021    1:46 PM  ONCBCN DISTRESS SCREENING  Distress experienced in past week (1-10) 3  Emotional problem type Nervousness/Anxiety;Adjusting to illness      Family/Social Information:  Housing Arrangement: patient lives with spouse.  Pt got married last month and has lived with her spouse for the past year. Family members/support persons in your life? Family- Pt's daughter, [name], resides in [place] and assists as able.  Pt's grand daughter assists w/ transportation, but fits pt in as she is able between [place] appointments as she drives for [place].  Pt's son recently hospitalized due to a serious infection and is anticipated to either remain  hospitalized or require SNF for long term antibiotics. Transportation concerns: yes  Employment: Unemployed .  Income source: Supported by [employer] Financial concerns: Yes, current concerns Type of concern: Secretary/administrator access concerns: no Religious or spiritual practice: Not known Services Currently in place:  none  Coping/ Adjustment to diagnosis: Patient understands treatment plan and what happens next? yes Concerns about diagnosis and/or treatment:  Pt has provide care to multiple family members with cancer who have decompensated following radiation/chemotherapy and does not want to experience the same thing herself while she is feeling good. Patient reported stressors: Heritage manager and/or priorities: Pt's hope is to receive minimal treatment w/ the priority being quality of life. Patient enjoys time with family/ friends Current coping skills/ strengths: Supportive family/friends     SUMMARY: Current SDOH Barriers:  Transportation  Clinical Social Work Clinical Goal(s):  Patient will work with pt to address needs related to transportation and anxiety surrounding treatment  Interventions: Discussed common feeling and emotions when being diagnosed with cancer, and the importance of support during treatment Informed patient of the support team roles and support services at [facility] Provided [place] contact information and encouraged patient to call with any questions or concerns Transportation for appointment 6/15 to be set up by RN to ensure pt has reliable transport for day of treatment .   Follow Up Plan: Patient will contact CSW with any support or resource needs Patient verbalizes understanding of plan: Yes  Henriette Combs, LCSW   Patient is participating in a Managed Medicaid Plan:  Yes

## 2021-09-03 NOTE — Telephone Encounter (Signed)
I was able to reach Mrs. Sandra Horne on the phone today. She was very apologetic about missing yesterdays appointments and not being available to answer the phone. Her son was taken to the hospital and required emergent surgery to remove a cyst from his spine. She said he has infection all over his body that they think started from a hip replacement surgery.   We have rescheduled her missed appointments to tomorrow, 6/9. I have also encouraged her to call me if something comes up and she is unable to make the appointment.   Mont Dutton R.T.(R)(T) Radiation Special Procedures Navigator

## 2021-09-04 ENCOUNTER — Ambulatory Visit
Admission: RE | Admit: 2021-09-04 | Discharge: 2021-09-04 | Disposition: A | Discharge status: Home / Self Care | Payer: Medicaid Other | Source: Ambulatory Visit | Attending: Radiation Oncology | Admitting: Radiation Oncology

## 2021-09-04 ENCOUNTER — Other Ambulatory Visit: Payer: Self-pay

## 2021-09-04 ENCOUNTER — Ambulatory Visit: Payer: Medicaid Other | Admitting: Radiation Oncology

## 2021-09-04 VITALS — BP 133/77 | HR 95 | Temp 96.3°F | Resp 18 | Ht 64.0 in | Wt 146.4 lb

## 2021-09-04 DIAGNOSIS — C7931 Secondary malignant neoplasm of brain: Secondary | ICD-10-CM | POA: Diagnosis present

## 2021-09-04 DIAGNOSIS — C3432 Malignant neoplasm of lower lobe, left bronchus or lung: Secondary | ICD-10-CM | POA: Diagnosis present

## 2021-09-04 DIAGNOSIS — F17210 Nicotine dependence, cigarettes, uncomplicated: Secondary | ICD-10-CM | POA: Diagnosis not present

## 2021-09-04 LAB — BUN & CREATININE (CHCC)
BUN: 9 mg/dL (ref 8–23)
Creatinine: 0.73 mg/dL (ref 0.44–1.00)
GFR, Estimated: >60 mL/min (ref >60)

## 2021-09-04 MED ORDER — SODIUM CHLORIDE 0.9% FLUSH
10.0000 mL | Freq: Once | INTRAVENOUS | Status: AC
  Administered 2021-09-04: 10 mL via INTRAVENOUS

## 2021-09-04 NOTE — Progress Notes (Signed)
Has armband been applied?  Yes  Does patient have an allergy to IV contrast dye?: No   Has patient ever received premedication for IV contrast dye?: No   Does patient take metformin?: No  If patient does take metformin when was the last dose: NA  Date of lab work: 09/04/2021 9:11 am  BUN: 9 CR: 0.73  IV site: Left anticubital.  Has IV site been added to flowsheet?  Yes  BP 133/77 (BP Location: Left Arm, Patient Position: Sitting)   Pulse 95   Temp (!) 96.3 F (35.7 C) (Temporal)   Resp 18   Ht 5\' 4"  (1.626 m)   Wt 146 lb 6 oz (66.4 kg)   SpO2 100%   BMI 25.13 kg/m

## 2021-09-04 NOTE — Progress Notes (Signed)
  Radiation Oncology         [phone] ________________________________  Name: Sandra Horne MRN: [id number]  Date: 09/04/2021  DOB: Aug 06, 1955  SIMULATION AND TREATMENT PLANNING NOTE    ICD-10-CM   1. Malignant neoplasm metastatic to brain 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  Identity was confirmed.  All relevant records and images related to the planned course of therapy were reviewed.  The patient freely provided informed written consent to proceed with treatment after reviewing the details related to the planned course of therapy. The consent form was witnessed and verified by the simulation staff. Intravenous access was established for contrast administration. Then, the patient was set-up in a stable reproducible supine position for radiation therapy.  A relocatable thermoplastic stereotactic head frame was fabricated for precise immobilization.  CT images were obtained.  Surface markings were placed.  The CT images were loaded into the planning software and fused with the patient's targeting MRI scan.  Then the target and avoidance structures were contoured.  Treatment planning then occurred.  The radiation prescription was entered and confirmed.  I have requested 3D planning  I have requested a DVH of the following structures: Brain stem, brain, left eye, right eye, lenses, optic chiasm, target volumes, uninvolved brain, and normal tissue.    SPECIAL TREATMENT PROCEDURE:  The planned course of therapy using radiation constitutes a special treatment procedure. Special care is required in the management of this patient for the following reasons. This treatment constitutes a Special Treatment Procedure for the following reason: High dose per fraction requiring special monitoring for increased  toxicities of treatment including daily imaging.  The special nature of the planned course of radiotherapy will require increased physician supervision and oversight to ensure patient's safety with optimal treatment outcomes.  This requires extended time and effort.  PLAN:  The patient will receive 20 Gy in 1 fraction.  ________________________________  Sheral Apley Tammi Klippel, M.D.

## 2021-09-08 ENCOUNTER — Ambulatory Visit: Payer: Medicaid Other | Admitting: Radiation Oncology

## 2021-09-08 DIAGNOSIS — C7931 Secondary malignant neoplasm of brain: Secondary | ICD-10-CM | POA: Diagnosis not present

## 2021-09-10 ENCOUNTER — Telehealth: Payer: Self-pay | Admitting: Radiation Therapy

## 2021-09-10 ENCOUNTER — Ambulatory Visit: Admission: RE | Admit: 2021-09-10 | Payer: Medicaid Other | Source: Ambulatory Visit | Admitting: Radiation Oncology

## 2021-09-10 NOTE — Telephone Encounter (Signed)
Mrs. Sandra Horne did not come in for her scheduled SRS treatment today. Multiple attempts were made to reach her on the phone, no answer for her or the contacts listed. Dr. Reatha Armour was here to review the treatment plan with physics, and left the meeting after ample amount of time spent waiting for the patient to arrive.   Mont Dutton R.T.(R)(T) Radiation Special Procedures Navigator

## 2021-09-16 ENCOUNTER — Encounter: Payer: Self-pay | Admitting: Radiation Therapy

## 2021-09-16 NOTE — Progress Notes (Signed)
Card sent out to patient regarding no show for scheduled brain SRS treatment. I have included my contact information in case she changes her mind and would like to reconsider future radiation treatment to treat her metastatic lung cancer.   Mont Dutton R.T.(R)(T) Radiation Special Procedures Navigator

## 2021-09-17 ENCOUNTER — Encounter: Payer: Self-pay | Admitting: *Deleted

## 2021-09-17 ENCOUNTER — Telehealth: Payer: Self-pay | Admitting: Internal Medicine

## 2021-09-17 NOTE — Progress Notes (Signed)
I reached out to practice administrator regarding Sandra Horne's appt to see Med Onc. She has not been able to reach her nor her daughter.

## 2021-09-17 NOTE — Progress Notes (Signed)
Practice administrator will continue to contact Sandra Horne to be set up for med onc.

## 2021-09-17 NOTE — Telephone Encounter (Signed)
VM Full 

## 2021-09-17 NOTE — Telephone Encounter (Signed)
Called daughter to discuss follow up for Med Onc.- no answer no voice mail

## 2021-10-01 ENCOUNTER — Other Ambulatory Visit: Payer: Self-pay | Admitting: Urology

## 2021-10-01 ENCOUNTER — Telehealth: Payer: Self-pay | Admitting: Radiation Therapy

## 2021-10-01 ENCOUNTER — Other Ambulatory Visit: Payer: Self-pay | Admitting: Radiation Therapy

## 2021-10-01 DIAGNOSIS — C7931 Secondary malignant neoplasm of brain: Secondary | ICD-10-CM

## 2021-10-01 MED ORDER — DEXAMETHASONE 4 MG PO TABS: 4.0000 mg | ORAL_TABLET | Freq: Every day | ORAL | 0 refills | Status: DC

## 2021-10-01 NOTE — Telephone Encounter (Signed)
I received a message that [name] is having more difficulty with her balance and wants to move forward with the brain [place] treatment originally scheduled in June. I explained that we will need an updated MRI scan for the treatment planning process, she understood the reasoning for this. [name] has called in a prescription for steroids to help improve her balance. [name] shared that she is moving to [facility] on Monday 7/10. This will make getting to her scheduled appointments easier. She was thankful for the response and medical intervention. I will work on getting her treatment planning completed and SRS rescheduled.   Mont Dutton R.T.(R)(T) Radiation Special Procedures Navigator

## 2021-10-05 ENCOUNTER — Other Ambulatory Visit: Payer: Self-pay

## 2021-10-05 ENCOUNTER — Ambulatory Visit
Admission: RE | Admit: 2021-10-05 | Discharge: 2021-10-05 | Disposition: A | Discharge status: Home / Self Care | Payer: Medicaid Other | Source: Ambulatory Visit | Attending: Radiation Oncology | Admitting: Radiation Oncology

## 2021-10-05 DIAGNOSIS — C3432 Malignant neoplasm of lower lobe, left bronchus or lung: Secondary | ICD-10-CM | POA: Diagnosis present

## 2021-10-05 DIAGNOSIS — C7931 Secondary malignant neoplasm of brain: Secondary | ICD-10-CM | POA: Insufficient documentation

## 2021-10-05 LAB — BUN & CREATININE (CHCC)
BUN: 7 mg/dL — ABNORMAL LOW (ref 8–23)
Creatinine: 0.71 mg/dL (ref 0.44–1.00)
GFR, Estimated: >60 mL/min (ref >60)

## 2021-10-09 ENCOUNTER — Inpatient Hospital Stay: Admission: RE | Admit: 2021-10-09 | Payer: Medicaid Other | Source: Ambulatory Visit

## 2021-10-11 ENCOUNTER — Ambulatory Visit
Admission: RE | Admit: 2021-10-11 | Discharge: 2021-10-11 | Disposition: A | Discharge status: Home / Self Care | Payer: Medicaid Other | Source: Ambulatory Visit | Attending: Radiation Oncology | Admitting: Radiation Oncology

## 2021-10-11 DIAGNOSIS — C3490 Malignant neoplasm of unspecified part of unspecified bronchus or lung: Secondary | ICD-10-CM | POA: Diagnosis not present

## 2021-10-11 DIAGNOSIS — C7931 Secondary malignant neoplasm of brain: Secondary | ICD-10-CM

## 2021-10-11 DIAGNOSIS — I619 Nontraumatic intracerebral hemorrhage, unspecified: Secondary | ICD-10-CM | POA: Diagnosis not present

## 2021-10-11 DIAGNOSIS — G936 Cerebral edema: Secondary | ICD-10-CM | POA: Diagnosis not present

## 2021-10-11 MED ORDER — GADOBENATE DIMEGLUMINE 529 MG/ML IV SOLN
13.0000 mL | Freq: Once | INTRAVENOUS | Status: AC | PRN
  Administered 2021-10-11: 13 mL via INTRAVENOUS

## 2021-10-11 NOTE — Progress Notes (Signed)
Manuela Schwartz, please set-up [facility]

## 2021-10-12 ENCOUNTER — Inpatient Hospital Stay (HOSPITAL_COMMUNITY)
Admission: EM | Admit: 2021-10-12 | Discharge: 2021-10-16 | DRG: 521 | Disposition: A | Discharge status: Home Health | Payer: Medicaid Other | Attending: Internal Medicine | Admitting: Internal Medicine

## 2021-10-12 ENCOUNTER — Emergency Department (HOSPITAL_COMMUNITY): Payer: Medicaid Other

## 2021-10-12 ENCOUNTER — Ambulatory Visit: Admission: RE | Admit: 2021-10-12 | Payer: Medicaid Other | Source: Ambulatory Visit | Admitting: Radiation Oncology

## 2021-10-12 ENCOUNTER — Encounter (HOSPITAL_COMMUNITY): Payer: Self-pay | Admitting: *Deleted

## 2021-10-12 ENCOUNTER — Inpatient Hospital Stay: Payer: Medicaid Other | Attending: Physician Assistant

## 2021-10-12 ENCOUNTER — Telehealth: Payer: Self-pay

## 2021-10-12 ENCOUNTER — Other Ambulatory Visit: Payer: Self-pay

## 2021-10-12 ENCOUNTER — Ambulatory Visit: Payer: Medicaid Other

## 2021-10-12 DIAGNOSIS — C3432 Malignant neoplasm of lower lobe, left bronchus or lung: Secondary | ICD-10-CM | POA: Diagnosis not present

## 2021-10-12 DIAGNOSIS — W1809XA Striking against other object with subsequent fall, initial encounter: Secondary | ICD-10-CM | POA: Diagnosis present

## 2021-10-12 DIAGNOSIS — D72829 Elevated white blood cell count, unspecified: Secondary | ICD-10-CM | POA: Diagnosis present

## 2021-10-12 DIAGNOSIS — I10 Essential (primary) hypertension: Secondary | ICD-10-CM | POA: Diagnosis present

## 2021-10-12 DIAGNOSIS — Z7952 Long term (current) use of systemic steroids: Secondary | ICD-10-CM

## 2021-10-12 DIAGNOSIS — M898X6 Other specified disorders of bone, lower leg: Secondary | ICD-10-CM | POA: Diagnosis not present

## 2021-10-12 DIAGNOSIS — Z8249 Family history of ischemic heart disease and other diseases of the circulatory system: Secondary | ICD-10-CM | POA: Diagnosis not present

## 2021-10-12 DIAGNOSIS — Z9889 Other specified postprocedural states: Secondary | ICD-10-CM | POA: Diagnosis not present

## 2021-10-12 DIAGNOSIS — C3490 Malignant neoplasm of unspecified part of unspecified bronchus or lung: Secondary | ICD-10-CM

## 2021-10-12 DIAGNOSIS — R0602 Shortness of breath: Secondary | ICD-10-CM | POA: Diagnosis not present

## 2021-10-12 DIAGNOSIS — R739 Hyperglycemia, unspecified: Secondary | ICD-10-CM | POA: Diagnosis present

## 2021-10-12 DIAGNOSIS — S72011A Unspecified intracapsular fracture of right femur, initial encounter for closed fracture: Secondary | ICD-10-CM | POA: Diagnosis present

## 2021-10-12 DIAGNOSIS — Z96641 Presence of right artificial hip joint: Secondary | ICD-10-CM | POA: Diagnosis not present

## 2021-10-12 DIAGNOSIS — S72001A Fracture of unspecified part of neck of right femur, initial encounter for closed fracture: Secondary | ICD-10-CM | POA: Diagnosis not present

## 2021-10-12 DIAGNOSIS — Y92009 Unspecified place in unspecified non-institutional (private) residence as the place of occurrence of the external cause: Secondary | ICD-10-CM

## 2021-10-12 DIAGNOSIS — D75839 Thrombocytosis, unspecified: Secondary | ICD-10-CM | POA: Diagnosis present

## 2021-10-12 DIAGNOSIS — Z809 Family history of malignant neoplasm, unspecified: Secondary | ICD-10-CM

## 2021-10-12 DIAGNOSIS — M25551 Pain in right hip: Secondary | ICD-10-CM | POA: Diagnosis not present

## 2021-10-12 DIAGNOSIS — C7931 Secondary malignant neoplasm of brain: Secondary | ICD-10-CM | POA: Diagnosis present

## 2021-10-12 DIAGNOSIS — Z043 Encounter for examination and observation following other accident: Secondary | ICD-10-CM | POA: Diagnosis not present

## 2021-10-12 DIAGNOSIS — F17210 Nicotine dependence, cigarettes, uncomplicated: Secondary | ICD-10-CM | POA: Diagnosis present

## 2021-10-12 DIAGNOSIS — D62 Acute posthemorrhagic anemia: Secondary | ICD-10-CM | POA: Diagnosis not present

## 2021-10-12 DIAGNOSIS — G936 Cerebral edema: Secondary | ICD-10-CM | POA: Diagnosis present

## 2021-10-12 DIAGNOSIS — R9431 Abnormal electrocardiogram [ECG] [EKG]: Secondary | ICD-10-CM | POA: Diagnosis not present

## 2021-10-12 DIAGNOSIS — Z471 Aftercare following joint replacement surgery: Secondary | ICD-10-CM | POA: Diagnosis not present

## 2021-10-12 DIAGNOSIS — R918 Other nonspecific abnormal finding of lung field: Secondary | ICD-10-CM | POA: Diagnosis not present

## 2021-10-12 DIAGNOSIS — J189 Pneumonia, unspecified organism: Secondary | ICD-10-CM | POA: Diagnosis not present

## 2021-10-12 HISTORY — DX: Malignant (primary) neoplasm, unspecified: C80.1

## 2021-10-12 LAB — BASIC METABOLIC PANEL
Anion gap: 5 (ref 5–15)
BUN: 9 mg/dL (ref 8–23)
CO2: 26 mmol/L (ref 22–32)
Calcium: 8.5 mg/dL — ABNORMAL LOW (ref 8.9–10.3)
Chloride: 107 mmol/L (ref 98–111)
Creatinine, Ser: 0.66 mg/dL (ref 0.44–1.00)
GFR, Estimated: >60 mL/min (ref >60)
Glucose, Bld: 159 mg/dL — ABNORMAL HIGH (ref 70–99)
Potassium: 3.6 mmol/L (ref 3.5–5.1)
Sodium: 138 mmol/L (ref 135–145)

## 2021-10-12 LAB — CBC
HCT: 32.5 % — ABNORMAL LOW (ref 36.0–46.0)
Hemoglobin: 10.4 g/dL — ABNORMAL LOW (ref 12.0–15.0)
MCH: 27.4 pg (ref 26.0–34.0)
MCHC: 32 g/dL (ref 30.0–36.0)
MCV: 85.8 fL (ref 80.0–100.0)
Platelets: 455 10*3/uL — ABNORMAL HIGH (ref 150–400)
RBC: 3.79 MIL/uL — ABNORMAL LOW (ref 3.87–5.11)
RDW: 16.7 % — ABNORMAL HIGH (ref 11.5–15.5)
WBC: 10.6 10*3/uL — ABNORMAL HIGH (ref 4.0–10.5)
nRBC: 0 % (ref 0.0–0.2)

## 2021-10-12 MED ORDER — HYDROMORPHONE HCL 1 MG/ML IJ SOLN
1.0000 mg | Freq: Once | INTRAMUSCULAR | Status: AC
  Administered 2021-10-12: 1 mg via INTRAVENOUS
  Filled 2021-10-12: qty 1

## 2021-10-12 MED ORDER — SODIUM CHLORIDE 0.9 % IV SOLN: INTRAVENOUS | Status: DC

## 2021-10-12 MED ORDER — ONDANSETRON HCL 4 MG/2ML IJ SOLN
4.0000 mg | Freq: Once | INTRAMUSCULAR | Status: AC
  Administered 2021-10-12: 4 mg via INTRAVENOUS
  Filled 2021-10-12: qty 2

## 2021-10-12 NOTE — ED Triage Notes (Signed)
Pt tripped over some cords and fell, c/o left hip pain since. Denies hitting her head.  Denies any blood thinners.
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
## 2021-10-12 NOTE — Telephone Encounter (Signed)
RN spoke patient she stated she forgot about appointment this afternoon and will need to reschedule.  Stated she lost her schedule and didn't know appointment was today.  Preferably for tomorrow 10/13/2021 between 10 am- 12 pm if open.  To patient will speak to the team and schedulers will reach out with new appointment.

## 2021-10-13 ENCOUNTER — Encounter (HOSPITAL_COMMUNITY): Payer: Self-pay | Admitting: Internal Medicine

## 2021-10-13 ENCOUNTER — Inpatient Hospital Stay (HOSPITAL_COMMUNITY): Payer: Medicaid Other

## 2021-10-13 ENCOUNTER — Inpatient Hospital Stay (HOSPITAL_COMMUNITY): Payer: Medicaid Other | Admitting: Anesthesiology

## 2021-10-13 ENCOUNTER — Ambulatory Visit
Admission: RE | Admit: 2021-10-13 | Discharge: 2021-10-13 | Disposition: A | Discharge status: Home / Self Care | Payer: Medicaid Other | Source: Ambulatory Visit | Attending: Radiation Oncology | Admitting: Radiation Oncology

## 2021-10-13 ENCOUNTER — Encounter (HOSPITAL_COMMUNITY)
Admission: EM | Disposition: A | Discharge status: Home Health | Payer: Self-pay | Source: Home / Self Care | Attending: Internal Medicine

## 2021-10-13 ENCOUNTER — Ambulatory Visit: Payer: Medicaid Other | Admitting: Radiation Oncology

## 2021-10-13 DIAGNOSIS — F17210 Nicotine dependence, cigarettes, uncomplicated: Secondary | ICD-10-CM | POA: Diagnosis not present

## 2021-10-13 DIAGNOSIS — C7931 Secondary malignant neoplasm of brain: Secondary | ICD-10-CM | POA: Diagnosis not present

## 2021-10-13 DIAGNOSIS — S72001A Fracture of unspecified part of neck of right femur, initial encounter for closed fracture: Secondary | ICD-10-CM

## 2021-10-13 DIAGNOSIS — J189 Pneumonia, unspecified organism: Secondary | ICD-10-CM

## 2021-10-13 DIAGNOSIS — C3490 Malignant neoplasm of unspecified part of unspecified bronchus or lung: Secondary | ICD-10-CM

## 2021-10-13 DIAGNOSIS — C3432 Malignant neoplasm of lower lobe, left bronchus or lung: Secondary | ICD-10-CM

## 2021-10-13 HISTORY — PX: HIP ARTHROPLASTY: SHX981

## 2021-10-13 LAB — CBC
HCT: 32.7 % — ABNORMAL LOW (ref 36.0–46.0)
HCT: 33.6 % — ABNORMAL LOW (ref 36.0–46.0)
Hemoglobin: 10.3 g/dL — ABNORMAL LOW (ref 12.0–15.0)
Hemoglobin: 10.5 g/dL — ABNORMAL LOW (ref 12.0–15.0)
MCH: 27.3 pg (ref 26.0–34.0)
MCH: 27 pg (ref 26.0–34.0)
MCHC: 31.5 g/dL (ref 30.0–36.0)
MCHC: 31.3 g/dL (ref 30.0–36.0)
MCV: 86.7 fL (ref 80.0–100.0)
MCV: 86.4 fL (ref 80.0–100.0)
Platelets: 446 10*3/uL — ABNORMAL HIGH (ref 150–400)
Platelets: 420 10*3/uL — ABNORMAL HIGH (ref 150–400)
RBC: 3.77 MIL/uL — ABNORMAL LOW (ref 3.87–5.11)
RBC: 3.89 MIL/uL (ref 3.87–5.11)
RDW: 16.9 % — ABNORMAL HIGH (ref 11.5–15.5)
RDW: 16.6 % — ABNORMAL HIGH (ref 11.5–15.5)
WBC: 11.6 10*3/uL — ABNORMAL HIGH (ref 4.0–10.5)
WBC: 17.2 10*3/uL — ABNORMAL HIGH (ref 4.0–10.5)
nRBC: 0 % (ref 0.0–0.2)
nRBC: 0 % (ref 0.0–0.2)

## 2021-10-13 LAB — COMPREHENSIVE METABOLIC PANEL
ALT: 19 U/L (ref 0–44)
AST: 13 U/L — ABNORMAL LOW (ref 15–41)
Albumin: 3 g/dL — ABNORMAL LOW (ref 3.5–5.0)
Alkaline Phosphatase: 79 U/L (ref 38–126)
Anion gap: 8 (ref 5–15)
BUN: 8 mg/dL (ref 8–23)
CO2: 25 mmol/L (ref 22–32)
Calcium: 8.4 mg/dL — ABNORMAL LOW (ref 8.9–10.3)
Chloride: 106 mmol/L (ref 98–111)
Creatinine, Ser: 0.63 mg/dL (ref 0.44–1.00)
GFR, Estimated: >60 mL/min (ref >60)
Glucose, Bld: 118 mg/dL — ABNORMAL HIGH (ref 70–99)
Potassium: 4.4 mmol/L (ref 3.5–5.1)
Sodium: 139 mmol/L (ref 135–145)
Total Bilirubin: 1 mg/dL (ref 0.3–1.2)
Total Protein: 6.4 g/dL — ABNORMAL LOW (ref 6.5–8.1)

## 2021-10-13 LAB — CREATININE, SERUM
Creatinine, Ser: 0.5 mg/dL (ref 0.44–1.00)
GFR, Estimated: >60 mL/min (ref >60)

## 2021-10-13 LAB — SURGICAL PCR SCREEN
MRSA, PCR: NEGATIVE
Staphylococcus aureus: NEGATIVE

## 2021-10-13 LAB — MAGNESIUM: Magnesium: 2.1 mg/dL (ref 1.7–2.4)

## 2021-10-13 LAB — PHOSPHORUS: Phosphorus: 3.9 mg/dL (ref 2.5–4.6)

## 2021-10-13 LAB — ABO/RH: ABO/RH(D): O POS

## 2021-10-13 SURGERY — HEMIARTHROPLASTY, HIP, DIRECT ANTERIOR APPROACH, FOR FRACTURE
Anesthesia: General | Site: Hip | Laterality: Right

## 2021-10-13 MED ORDER — PHENOL 1.4 % MT LIQD: 1.0000 | OROMUCOSAL | Status: DC | PRN

## 2021-10-13 MED ORDER — DOCUSATE SODIUM 100 MG PO CAPS
100.0000 mg | ORAL_CAPSULE | Freq: Two times a day (BID) | ORAL | Status: DC
  Administered 2021-10-13 – 2021-10-16 (×6): 100 mg via ORAL
  Filled 2021-10-13 (×6): qty 1

## 2021-10-13 MED ORDER — FENTANYL CITRATE (PF) 100 MCG/2ML IJ SOLN
INTRAMUSCULAR | Status: DC | PRN
Start: 2021-10-13 — End: 2021-10-13
  Administered 2021-10-13 (×4): 50 ug via INTRAVENOUS

## 2021-10-13 MED ORDER — FENTANYL CITRATE (PF) 100 MCG/2ML IJ SOLN
INTRAMUSCULAR | Status: AC
  Filled 2021-10-13: qty 2

## 2021-10-13 MED ORDER — CHLORHEXIDINE GLUCONATE CLOTH 2 % EX PADS
6.0000 | MEDICATED_PAD | Freq: Every day | CUTANEOUS | Status: DC
Start: 2021-10-14 — End: 2021-10-16
  Administered 2021-10-14 – 2021-10-16 (×3): 6 via TOPICAL

## 2021-10-13 MED ORDER — TRANEXAMIC ACID-NACL 1000-0.7 MG/100ML-% IV SOLN
1000.0000 mg | INTRAVENOUS | Status: AC
  Administered 2021-10-13: 1000 mg via INTRAVENOUS

## 2021-10-13 MED ORDER — MENTHOL 3 MG MT LOZG: 1.0000 | LOZENGE | OROMUCOSAL | Status: DC | PRN

## 2021-10-13 MED ORDER — HYDROMORPHONE HCL 1 MG/ML IJ SOLN
0.5000 mg | INTRAMUSCULAR | Status: DC | PRN
  Administered 2021-10-13 (×4): 0.5 mg via INTRAVENOUS
  Filled 2021-10-13 (×3): qty 0.5

## 2021-10-13 MED ORDER — ENOXAPARIN SODIUM 30 MG/0.3ML IJ SOSY
30.0000 mg | PREFILLED_SYRINGE | INTRAMUSCULAR | Status: DC
  Administered 2021-10-14: 30 mg via SUBCUTANEOUS
  Filled 2021-10-13: qty 0.3

## 2021-10-13 MED ORDER — METHOCARBAMOL 500 MG PO TABS
500.0000 mg | ORAL_TABLET | Freq: Four times a day (QID) | ORAL | Status: DC | PRN
  Administered 2021-10-15: 500 mg via ORAL
  Filled 2021-10-13: qty 1

## 2021-10-13 MED ORDER — POLYETHYLENE GLYCOL 3350 17 G PO PACK
17.0000 g | PACK | Freq: Every day | ORAL | Status: DC
  Administered 2021-10-15: 17 g via ORAL
  Filled 2021-10-13 (×3): qty 1

## 2021-10-13 MED ORDER — ONDANSETRON HCL 4 MG/2ML IJ SOLN: 4.0000 mg | Freq: Four times a day (QID) | INTRAMUSCULAR | Status: DC | PRN

## 2021-10-13 MED ORDER — BUPIVACAINE-EPINEPHRINE (PF) 0.5% -1:200000 IJ SOLN
INTRAMUSCULAR | Status: DC | PRN
  Administered 2021-10-13: 30 mL via PERINEURAL

## 2021-10-13 MED ORDER — BUPIVACAINE-EPINEPHRINE (PF) 0.5% -1:200000 IJ SOLN
INTRAMUSCULAR | Status: AC
  Filled 2021-10-13: qty 30

## 2021-10-13 MED ORDER — CEFAZOLIN SODIUM-DEXTROSE 2-4 GM/100ML-% IV SOLN
2.0000 g | INTRAVENOUS | Status: AC
  Administered 2021-10-13: 2 g via INTRAVENOUS

## 2021-10-13 MED ORDER — LIDOCAINE 2% (20 MG/ML) 5 ML SYRINGE
INTRAMUSCULAR | Status: DC | PRN
  Administered 2021-10-13: 100 mg via INTRAVENOUS

## 2021-10-13 MED ORDER — CHLORHEXIDINE GLUCONATE 0.12 % MT SOLN
15.0000 mL | Freq: Once | OROMUCOSAL | Status: AC
  Administered 2021-10-13: 15 mL via OROMUCOSAL

## 2021-10-13 MED ORDER — TRAMADOL HCL 50 MG PO TABS
50.0000 mg | ORAL_TABLET | Freq: Four times a day (QID) | ORAL | Status: DC
  Administered 2021-10-13 – 2021-10-14 (×3): 50 mg via ORAL
  Filled 2021-10-13 (×3): qty 1

## 2021-10-13 MED ORDER — ONDANSETRON HCL 4 MG/2ML IJ SOLN
INTRAMUSCULAR | Status: DC | PRN
  Administered 2021-10-13: 4 mg via INTRAVENOUS

## 2021-10-13 MED ORDER — PROPOFOL 10 MG/ML IV BOLUS
INTRAVENOUS | Status: DC | PRN
  Administered 2021-10-13: 100 mg via INTRAVENOUS

## 2021-10-13 MED ORDER — ROCURONIUM BROMIDE 10 MG/ML (PF) SYRINGE
PREFILLED_SYRINGE | INTRAVENOUS | Status: DC | PRN
  Administered 2021-10-13: 10 mg via INTRAVENOUS
  Administered 2021-10-13: 20 mg via INTRAVENOUS
  Administered 2021-10-13: 60 mg via INTRAVENOUS

## 2021-10-13 MED ORDER — PHENYLEPHRINE 80 MCG/ML (10ML) SYRINGE FOR IV PUSH (FOR BLOOD PRESSURE SUPPORT)
PREFILLED_SYRINGE | INTRAVENOUS | Status: DC | PRN
  Administered 2021-10-13 (×2): 80 ug via INTRAVENOUS

## 2021-10-13 MED ORDER — ONDANSETRON HCL 4 MG/2ML IJ SOLN
INTRAMUSCULAR | Status: AC
  Filled 2021-10-13: qty 2

## 2021-10-13 MED ORDER — ESMOLOL HCL 100 MG/10ML IV SOLN
INTRAVENOUS | Status: DC | PRN
  Administered 2021-10-13: 30 mg via INTRAVENOUS
  Administered 2021-10-13: 20 mg via INTRAVENOUS
  Administered 2021-10-13: 30 mg via INTRAVENOUS

## 2021-10-13 MED ORDER — SODIUM CHLORIDE 0.9 % IR SOLN
Status: DC | PRN
  Administered 2021-10-13: 3000 mL

## 2021-10-13 MED ORDER — PROPOFOL 10 MG/ML IV BOLUS
INTRAVENOUS | Status: AC
  Filled 2021-10-13: qty 20

## 2021-10-13 MED ORDER — METOCLOPRAMIDE HCL 5 MG/ML IJ SOLN: 5.0000 mg | Freq: Three times a day (TID) | INTRAMUSCULAR | Status: DC | PRN

## 2021-10-13 MED ORDER — ACETAMINOPHEN 650 MG RE SUPP: 650.0000 mg | Freq: Four times a day (QID) | RECTAL | Status: DC | PRN

## 2021-10-13 MED ORDER — TRANEXAMIC ACID-NACL 1000-0.7 MG/100ML-% IV SOLN
INTRAVENOUS | Status: AC
  Filled 2021-10-13: qty 100

## 2021-10-13 MED ORDER — FLEET ENEMA 7-19 GM/118ML RE ENEM: 1.0000 | ENEMA | Freq: Once | RECTAL | Status: DC | PRN

## 2021-10-13 MED ORDER — BISACODYL 5 MG PO TBEC: 5.0000 mg | DELAYED_RELEASE_TABLET | Freq: Every day | ORAL | Status: DC | PRN

## 2021-10-13 MED ORDER — TRANEXAMIC ACID-NACL 1000-0.7 MG/100ML-% IV SOLN
1000.0000 mg | Freq: Once | INTRAVENOUS | Status: AC
  Administered 2021-10-13: 1000 mg via INTRAVENOUS
  Filled 2021-10-13: qty 100

## 2021-10-13 MED ORDER — METHOCARBAMOL 1000 MG/10ML IJ SOLN: 500.0000 mg | Freq: Four times a day (QID) | INTRAVENOUS | Status: DC | PRN

## 2021-10-13 MED ORDER — LIDOCAINE HCL (PF) 2 % IJ SOLN
INTRAMUSCULAR | Status: AC
  Filled 2021-10-13: qty 10

## 2021-10-13 MED ORDER — CEFAZOLIN SODIUM-DEXTROSE 2-4 GM/100ML-% IV SOLN
2.0000 g | Freq: Four times a day (QID) | INTRAVENOUS | Status: AC
  Administered 2021-10-13 – 2021-10-14 (×2): 2 g via INTRAVENOUS
  Filled 2021-10-13 (×2): qty 100

## 2021-10-13 MED ORDER — OXYCODONE HCL 5 MG PO TABS: 10.0000 mg | ORAL_TABLET | ORAL | Status: DC | PRN

## 2021-10-13 MED ORDER — ACETAMINOPHEN 325 MG PO TABS: 650.0000 mg | ORAL_TABLET | Freq: Four times a day (QID) | ORAL | Status: DC | PRN

## 2021-10-13 MED ORDER — ONDANSETRON HCL 4 MG PO TABS: 4.0000 mg | ORAL_TABLET | Freq: Four times a day (QID) | ORAL | Status: DC | PRN

## 2021-10-13 MED ORDER — HYDROCORTISONE SOD SUC (PF) 100 MG IJ SOLR
INTRAMUSCULAR | Status: DC | PRN
  Administered 2021-10-13: 100 mg via INTRAVENOUS

## 2021-10-13 MED ORDER — ALUM & MAG HYDROXIDE-SIMETH 200-200-20 MG/5ML PO SUSP: 30.0000 mL | ORAL | Status: DC | PRN

## 2021-10-13 MED ORDER — OXYCODONE HCL 5 MG PO TABS
5.0000 mg | ORAL_TABLET | ORAL | Status: DC
  Administered 2021-10-13 – 2021-10-14 (×4): 5 mg via ORAL
  Filled 2021-10-13 (×5): qty 1

## 2021-10-13 MED ORDER — DEXAMETHASONE SODIUM PHOSPHATE 10 MG/ML IJ SOLN
INTRAMUSCULAR | Status: DC | PRN
  Administered 2021-10-13: 4 mg via INTRAVENOUS

## 2021-10-13 MED ORDER — MIDAZOLAM HCL 2 MG/2ML IJ SOLN
INTRAMUSCULAR | Status: AC
  Filled 2021-10-13: qty 2

## 2021-10-13 MED ORDER — LACTATED RINGERS IV SOLN: INTRAVENOUS | Status: DC | PRN

## 2021-10-13 MED ORDER — SODIUM CHLORIDE 0.9 % IV SOLN: INTRAVENOUS | Status: DC

## 2021-10-13 MED ORDER — CEFAZOLIN SODIUM-DEXTROSE 2-4 GM/100ML-% IV SOLN
INTRAVENOUS | Status: AC
  Filled 2021-10-13: qty 100

## 2021-10-13 MED ORDER — DEXAMETHASONE SODIUM PHOSPHATE 4 MG/ML IJ SOLN
4.0000 mg | Freq: Once | INTRAMUSCULAR | Status: AC
Start: 2021-10-13 — End: 2021-10-13
  Administered 2021-10-13: 4 mg via INTRAVENOUS
  Filled 2021-10-13: qty 1

## 2021-10-13 MED ORDER — OXYCODONE HCL 5 MG PO TABS
5.0000 mg | ORAL_TABLET | ORAL | Status: DC | PRN
  Administered 2021-10-14: 5 mg via ORAL
  Administered 2021-10-14: 10 mg via ORAL
  Administered 2021-10-14: 5 mg via ORAL
  Administered 2021-10-15: 10 mg via ORAL
  Administered 2021-10-15: 5 mg via ORAL
  Administered 2021-10-15: 10 mg via ORAL
  Administered 2021-10-15 – 2021-10-16 (×3): 5 mg via ORAL
  Filled 2021-10-13: qty 2
  Filled 2021-10-13 (×4): qty 1
  Filled 2021-10-13 (×2): qty 2
  Filled 2021-10-13: qty 1
  Filled 2021-10-13: qty 2

## 2021-10-13 MED ORDER — SUGAMMADEX SODIUM 200 MG/2ML IV SOLN
INTRAVENOUS | Status: DC | PRN
  Administered 2021-10-13: 200 mg via INTRAVENOUS

## 2021-10-13 MED ORDER — ORAL CARE MOUTH RINSE: 15.0000 mL | Freq: Once | OROMUCOSAL | Status: AC

## 2021-10-13 MED ORDER — HYDROMORPHONE HCL 1 MG/ML IJ SOLN: 0.2500 mg | INTRAMUSCULAR | Status: DC | PRN

## 2021-10-13 MED ORDER — POVIDONE-IODINE 10 % EX SWAB: 2.0000 | Freq: Once | CUTANEOUS | Status: DC

## 2021-10-13 MED ORDER — METOCLOPRAMIDE HCL 10 MG PO TABS: 5.0000 mg | ORAL_TABLET | Freq: Three times a day (TID) | ORAL | Status: DC | PRN

## 2021-10-13 MED ORDER — MEPERIDINE HCL 50 MG/ML IJ SOLN: 6.2500 mg | INTRAMUSCULAR | Status: DC | PRN

## 2021-10-13 MED ORDER — CHLORHEXIDINE GLUCONATE 4 % EX LIQD: 60.0000 mL | Freq: Once | CUTANEOUS | Status: DC

## 2021-10-13 MED ORDER — PHENYLEPHRINE 80 MCG/ML (10ML) SYRINGE FOR IV PUSH (FOR BLOOD PRESSURE SUPPORT)
PREFILLED_SYRINGE | INTRAVENOUS | Status: AC
  Filled 2021-10-13: qty 10

## 2021-10-13 MED ORDER — HYDROMORPHONE HCL 1 MG/ML IJ SOLN
0.5000 mg | INTRAMUSCULAR | Status: DC | PRN
  Administered 2021-10-14: 0.5 mg via INTRAVENOUS
  Administered 2021-10-14: 1 mg via INTRAVENOUS
  Administered 2021-10-14: 0.5 mg via INTRAVENOUS
  Administered 2021-10-15 (×2): 1 mg via INTRAVENOUS
  Filled 2021-10-13: qty 0.5
  Filled 2021-10-13 (×3): qty 1
  Filled 2021-10-13: qty 0.5

## 2021-10-13 MED ORDER — HYDROMORPHONE HCL 1 MG/ML IJ SOLN
INTRAMUSCULAR | Status: AC
  Filled 2021-10-13: qty 0.5

## 2021-10-13 MED ORDER — ONDANSETRON HCL 4 MG/2ML IJ SOLN
4.0000 mg | Freq: Four times a day (QID) | INTRAMUSCULAR | Status: DC
  Administered 2021-10-13 – 2021-10-14 (×2): 4 mg via INTRAVENOUS
  Filled 2021-10-13 (×3): qty 2

## 2021-10-13 MED ORDER — 0.9 % SODIUM CHLORIDE (POUR BTL) OPTIME
TOPICAL | Status: DC | PRN
  Administered 2021-10-13: 1000 mL

## 2021-10-13 MED ORDER — ACETAMINOPHEN 325 MG PO TABS: 325.0000 mg | ORAL_TABLET | Freq: Four times a day (QID) | ORAL | Status: DC | PRN

## 2021-10-13 MED ORDER — LACTATED RINGERS IV SOLN: INTRAVENOUS | Status: DC

## 2021-10-13 SURGICAL SUPPLY — 57 items
APL PRP STRL LF DISP 70% ISPRP (MISCELLANEOUS) ×2
BIPOLAR PROS AML 45 (Hips) ×2 IMPLANT
BIT DRILL 2.8X128 (BIT) ×2 IMPLANT
BLADE SAGITTAL 25.0X1.27X90 (BLADE) ×2 IMPLANT
BRUSH FEMORAL CANAL (MISCELLANEOUS) IMPLANT
CHLORAPREP W/TINT 26 (MISCELLANEOUS) ×3 IMPLANT
CLOTH BEACON ORANGE TIMEOUT ST (SAFETY) ×2 IMPLANT
COVER LIGHT HANDLE STERIS (MISCELLANEOUS) ×4 IMPLANT
DECANTER SPIKE VIAL GLASS SM (MISCELLANEOUS) ×2 IMPLANT
DRAPE HIP W/POCKET STRL (MISCELLANEOUS) ×2 IMPLANT
DRAPE U-SHAPE 47X51 STRL (DRAPES) ×2 IMPLANT
DRSG MEPILEX BORDER 4X12 (GAUZE/BANDAGES/DRESSINGS) ×2 IMPLANT
DRSG MEPILEX SACRM 8.7X9.8 (GAUZE/BANDAGES/DRESSINGS) ×2 IMPLANT
ELECT REM PT RETURN 9FT ADLT (ELECTROSURGICAL) ×2
ELECTRODE REM PT RTRN 9FT ADLT (ELECTROSURGICAL) ×1 IMPLANT
GLOVE BIOGEL PI IND STRL 7.0 (GLOVE) ×2 IMPLANT
GLOVE BIOGEL PI INDICATOR 7.0 (GLOVE) ×4
GLOVE SS N UNI LF 8.5 STRL (GLOVE) ×2 IMPLANT
GLOVE SURG POLYISO LF SZ8 (GLOVE) ×6 IMPLANT
GOWN STRL REUS W/TWL LRG LVL3 (GOWN DISPOSABLE) ×4 IMPLANT
GOWN STRL REUS W/TWL XL LVL3 (GOWN DISPOSABLE) ×2 IMPLANT
HANDPIECE INTERPULSE COAX TIP (DISPOSABLE) ×2
HEAD BIPOLAR PROS AML 45 (Hips) IMPLANT
HEAD FEM STD 28X+1.5 STRL (Hips) ×1 IMPLANT
INST SET MAJOR BONE (KITS) ×2 IMPLANT
IV NS IRRIG 3000ML ARTHROMATIC (IV SOLUTION) ×1 IMPLANT
KIT BLADEGUARD II DBL (SET/KITS/TRAYS/PACK) ×2 IMPLANT
KIT TURNOVER KIT A (KITS) ×2 IMPLANT
MANIFOLD NEPTUNE II (INSTRUMENTS) ×2 IMPLANT
MARKER SKIN DUAL TIP RULER LAB (MISCELLANEOUS) ×2 IMPLANT
NDL HYPO 18GX1.5 BLUNT FILL (NEEDLE) ×1 IMPLANT
NDL HYPO 21X1.5 SAFETY (NEEDLE) ×1 IMPLANT
NEEDLE HYPO 18GX1.5 BLUNT FILL (NEEDLE) ×2 IMPLANT
NEEDLE HYPO 21X1.5 SAFETY (NEEDLE) ×2 IMPLANT
NS IRRIG 1000ML POUR BTL (IV SOLUTION) ×2 IMPLANT
PACK TOTAL JOINT (CUSTOM PROCEDURE TRAY) ×2 IMPLANT
PAD ARMBOARD 7.5X6 YLW CONV (MISCELLANEOUS) ×2 IMPLANT
PASSER SUT SWANSON 36MM LOOP (INSTRUMENTS) IMPLANT
PIN STMN SNGL STERILE 9X3.6MM (PIN) ×4 IMPLANT
SET BASIN LINEN APH (SET/KITS/TRAYS/PACK) ×2 IMPLANT
SET HNDPC FAN SPRY TIP SCT (DISPOSABLE) ×1 IMPLANT
SPONGE T-LAP 18X18 ~~LOC~~+RFID (SPONGE) ×4 IMPLANT
STAPLER VISISTAT 35W (STAPLE) ×2 IMPLANT
STEM SUMMIT PRESSFIT SZ 6 (Hips) ×1 IMPLANT
SUT BRALON NAB BRD #1 30IN (SUTURE) ×4 IMPLANT
SUT ETHIBOND 5 LR DA (SUTURE) ×4 IMPLANT
SUT MNCRL 0 VIOLET CTX 36 (SUTURE) ×1 IMPLANT
SUT MON AB 2-0 CT1 36 (SUTURE) ×2 IMPLANT
SUT MONOCRYL 0 CTX 36 (SUTURE) ×2
SUT VIC AB 1 CT1 27 (SUTURE) ×12
SUT VIC AB 1 CT1 27XBRD ANTBC (SUTURE) ×2 IMPLANT
SYR 20ML LL LF (SYRINGE) ×6 IMPLANT
SYR BULB IRRIG 60ML STRL (SYRINGE) ×2 IMPLANT
TOWER CARTRIDGE SMART MIX (DISPOSABLE) IMPLANT
TRAY FOLEY MTR SLVR 16FR STAT (SET/KITS/TRAYS/PACK) ×2 IMPLANT
WATER STERILE IRR 1000ML POUR (IV SOLUTION) ×4 IMPLANT
YANKAUER SUCT 12FT TUBE ARGYLE (SUCTIONS) ×2 IMPLANT

## 2021-10-13 NOTE — Interval H&P Note (Signed)
History and Physical Interval Note:  10/13/2021 12:06 PM  Sandra Horne  has presented today for surgery, with the diagnosis of fracture right hip.  The various methods of treatment have been discussed with the patient and family. After consideration of risks, benefits and other options for treatment, the patient has consented to  Procedure(s): ARTHROPLASTY BIPOLAR HIP (HEMIARTHROPLASTY) (Right) as a surgical intervention.  The patient's history has been reviewed, patient examined, no change in status, stable for surgery.  I have reviewed the patient's chart and labs.  Questions were answered to the patient's satisfaction.     Arther Abbott

## 2021-10-13 NOTE — Transfer of Care (Signed)
Immediate Anesthesia Transfer of Care Note  Patient: Sandra Horne  Procedure(s) Performed: ARTHROPLASTY BIPOLAR HIP (HEMIARTHROPLASTY) (Right: Hip)  Patient Location: PACU  Anesthesia Type:General  Level of Consciousness: awake, alert , sedated and drowsy  Airway & Oxygen Therapy: Patient Spontanous Breathing and Patient connected to face mask oxygen  Post-op Assessment: Report given to RN and Post -op Vital signs reviewed and stable  Post vital signs: Reviewed and stable  Last Vitals:  Vitals Value Taken Time  BP 180/97 10/13/21 1531  Temp 36.9 C 10/13/21 1531  Pulse 85 10/13/21 1534  Resp 25 10/13/21 1534  SpO2 100 % 10/13/21 1534  Vitals shown include unvalidated device data.  Last Pain:  Vitals:   10/13/21 1120  TempSrc: Oral  PainSc: 4          Complications: No notable events documented.
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
## 2021-10-13 NOTE — Anesthesia Preprocedure Evaluation (Addendum)
Anesthesia Evaluation  Patient identified by MRN, date of birth, ID band Patient awake    Reviewed: Allergy & Precautions, NPO status , Patient's Chart, lab work & pertinent test results  Airway Mallampati: II  TM Distance: >3 FB Neck ROM: Full    Dental  (+) Dental Advisory Given, Missing, Chipped, Poor Dentition   Pulmonary pneumonia (left lung cancer with mets to brain), Current Smoker and Patient abstained from smoking.,  IMPRESSION: Left hilar/infrahilar mass lesion measuring 4.4 x 3.3 cm. This isagain consistent with a primary pulmonary neoplasm. Associated hilarand mediastinal lymph nodes are noted. Correlate with the clinical history as to any prior workup following previous PET-CT in 2021. Some new postobstructive inflammatory changes noted in the leftlower lobe when compared with the prior PET-CT.  Focal 17 mm nodule in the right lobe of the thyroid. This is decreased in size when compared with prior CT of the chest from 2021. Recommend nonemergent thyroid US (ref: J Am Coll Radiol. 2015 Feb;12(2): 143-50).  Heterogeneity of the liver without focal mass.   Electronically Signed   By: Inez Catalina M.D.   On: 03/15/2021 19:54   Pulmonary exam normal breath sounds clear to auscultation       Cardiovascular negative cardio ROS Normal cardiovascular exam Rhythm:Regular Rate:Normal     Neuro/Psych Brain mets IMPRESSION: 1. Progression of metastatic disease since May. Four new small enhancing brain metastases (range 2-7 mm), and increased size of the known left parietal metastasis (now 28 mm). Associated increased cerebral vasogenic edema, but no significant intracranial mass effect.  2. Stable 15 mm left occipital lobe metastasis.   Electronically Signed   By: Genevie Ann M.D.   On: 10/11/2021 13:12 negative neurological ROS  negative psych ROS   GI/Hepatic negative GI ROS, Neg liver ROS,   Endo/Other   negative endocrine ROS  Renal/GU negative Renal ROS  negative genitourinary   Musculoskeletal negative musculoskeletal ROS (+)   Abdominal   Peds negative pediatric ROS (+)  Hematology negative hematology ROS (+)   Anesthesia Other Findings 12-Oct-2021 23:06:24 [facility]-ER ROUTINE RECORD 06-21-1955 (31 yr) Female Black Vent. rate 92 BPM PR interval 154 ms QRS duration 94 ms QT/QTcB 352/436 ms P-R-T axes 66 67 16 Sinus rhythm Consider right atrial enlargement Confirmed by Fredia Sorrow [phone]) on 10/12/2021 11:09:31 PM  Reproductive/Obstetrics negative OB ROS                            Anesthesia Physical Anesthesia Plan  ASA: 4  Anesthesia Plan: General   Post-op Pain Management: Dilaudid IV   Induction: Intravenous  PONV Risk Score and Plan: 3 and Ondansetron and Dexamethasone  Airway Management Planned: Oral ETT  Additional Equipment:   Intra-op Plan:   Post-operative Plan: Extubation in OR and Possible Post-op intubation/ventilation  Informed Consent: I have reviewed the patients History and Physical, chart, labs and discussed the procedure including the risks, benefits and alternatives for the proposed anesthesia with the patient or authorized representative who has indicated his/her understanding and acceptance.     Dental advisory given  Plan Discussed with: CRNA and Surgeon  Anesthesia Plan Comments: (Risk of postop intubation and ventilation was explained, Patient refused spinal anesthesia, Risks and benefits explained. Will give stress dose steroids.)      Anesthesia Quick Evaluation

## 2021-10-13 NOTE — Brief Op Note (Signed)
10/13/2021  3:32 PM  PATIENT:  Sandra Horne  66 y.o. female  PRE-OPERATIVE DIAGNOSIS:  fracture right hip  POST-OPERATIVE DIAGNOSIS:  fracture right hip  PROCEDURE:  Procedure(s): ARTHROPLASTY BIPOLAR HIP (HEMIARTHROPLASTY) (Right)  [employer] Press-fit femoral stem size 6 45 femoral head 1.5 neck  Approach direct lateral  Findings displaced femoral neck fracture approximately mid cervical, the acetabulum looks normal  SURGEON:  Surgeon(s) and Role:    * Carole Civil, MD - Primary  PHYSICIAN ASSISTANT:   ASSISTANTS: Nikki Cox  ANESTHESIA:   General  EBL:  200 mL   BLOOD ADMINISTERED: None  DRAINS: None  LOCAL MEDICATIONS USED: [place]aine with epinephrine  SPECIMEN: Femoral head  DISPOSITION OF SPECIMEN: Pathology  COUNTS: Correct  TOURNIQUET:  * No tourniquets in log *  DICTATION: .Dragon  PLAN OF CARE: Admit  PATIENT DISPOSITION: Hemodynamically stable   Delay start of Pharmacological VTE agent (>24hrs) due to surgical blood loss or risk of bleeding: Not applicable

## 2021-10-13 NOTE — Hospital Course (Signed)
66 y.o. 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  Patient recently finished her radiation oncology, MRI of brain was done yesterday, she states that she tripped over a cord and landed on her right side with subsequent complaint of right hip pain and difficulty in being able to bear weight on affected leg.  She denies hitting her head and denies loss of consciousness.  EMS was activated and patient was taken to the ED for further evaluation and management

## 2021-10-13 NOTE — Progress Notes (Signed)
  Progress Note   Patient: Sandra Horne KDT:[id number] DOB: March 15, 1956 DOA: 10/12/2021     1 DOS: the patient was seen and examined on 10/13/2021   Brief hospital course: 66 y.o. 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  Patient recently finished her radiation oncology, MRI of brain was done yesterday, she states that she tripped over a cord and landed on her right side with subsequent complaint of right hip pain and difficulty in being able to bear weight on affected leg.  She denies hitting her head and denies loss of consciousness.  EMS was activated and patient was taken to the ED for further evaluation and management  Assessment and Plan: Acute right femoral neck fracture Right hip x-ray showed acute impacted subcapital right femoral neck fracture as above. Continue analgesia as needed Orthopedic Surgery consulted.Pt now s/p surgery 7/18 F/u with PT/OT recs   Non-small cell lung cancer of the lung with metastasis Metastatic brain lesion Recent MRI of brain demonstrating  progression of metastatic disease since May. Four new small enhancing brain metastases (range 2-7 mm), and increased size of the known left parietal metastasis (now 28 mm). Associated increased cerebral vasogenic edema, but no significant intracranial mass effect. 2. Stable 15 mm left occipital lobe metastasis. Oncology consulted this visit  Hypoalbumenia  -cont to encourage PO as tolerated       Subjective: Pt seen prior to surgery. Complained of marked hip pain, requesting pain meds  Physical Exam: Vitals:   10/13/21 1016 10/13/21 1120 10/13/21 1531 10/13/21 1545  BP: 129/61 136/69 (!) 180/97 (!) 150/88  Pulse: 83 93 88 94  Resp: 18 16 (!) 21 15  Temp: 98.3 F (36.8 C) 98.2 F (36.8 C) 98.4 F (36.9 C)   TempSrc: Oral Oral    SpO2: 94% 96% 100% 100%  Weight:      Height:        General exam: Awake, laying in bed, in nad Respiratory system: Normal respiratory effort, no wheezing Cardiovascular system: regular rate, s1, s2 Gastrointestinal system: Soft, nondistended, positive BS Central nervous system: CN2-12 grossly intact, strength intact Extremities: Perfused, no clubbing Skin: Normal skin turgor, no notable skin lesions seen Psychiatry: Mood normal // no visual hallucinations    Data Reviewed:  Labs reviewed: Na 139, Cr 0.63  Family Communication: Pt in room, family not at bedside  Disposition: Status is: Inpatient Remains inpatient appropriate because: Severity of illness  Planned Discharge Destination: Home    Author: Marylu Lund, MD 10/13/2021 4:04 PM  For on call review [email or web address].

## 2021-10-13 NOTE — Anesthesia Postprocedure Evaluation (Signed)
Anesthesia Post Note  Patient: KEVIN MARIO  Procedure(s) Performed: ARTHROPLASTY BIPOLAR HIP (HEMIARTHROPLASTY) (Right: Hip)  Patient location during evaluation: PACU Anesthesia Type: General Level of consciousness: awake and alert and oriented Pain management: pain level controlled Vital Signs Assessment: post-procedure vital signs reviewed and stable Respiratory status: spontaneous breathing, nonlabored ventilation, respiratory function stable and patient connected to nasal cannula oxygen Cardiovascular status: blood pressure returned to baseline and stable Postop Assessment: no apparent nausea or vomiting Anesthetic complications: no   No notable events documented.   Last Vitals:  Vitals:   10/13/21 1531 10/13/21 1545  BP: (!) 180/97 (!) 150/88  Pulse: 88 94  Resp: (!) 21 15  Temp: 36.9 C   SpO2: 100% 100%    Last Pain:  Vitals:   10/13/21 1545  TempSrc:   PainSc: 0-No pain                 Nashika Coker C Noell Shular

## 2021-10-13 NOTE — Op Note (Signed)
10/13/2021  3:32 PM  PATIENT:  Sandra Horne  66 y.o. female  PRE-OPERATIVE DIAGNOSIS:  fracture right hip  POST-OPERATIVE DIAGNOSIS:  fracture right hip  PROCEDURE:  Procedure(s): ARTHROPLASTY BIPOLAR HIP (HEMIARTHROPLASTY) (Right)  [employer] Press-fit femoral stem size 6 45 femoral head 1.5 neck  Approach direct lateral  Findings displaced femoral neck fracture approximately mid cervical, the acetabulum looks normal  SURGEON:  Surgeon(s) and Role:    Carole Civil, MD - Primary  PHYSICIAN ASSISTANT:   ASSISTANTS: Nikki Cox  ANESTHESIA:   General  EBL:  200 mL   BLOOD ADMINISTERED: None  DRAINS: None  LOCAL MEDICATIONS USED: Marcaine with epinephrine  SPECIMEN: Femoral head  DISPOSITION OF SPECIMEN: Pathology  COUNTS: Correct  TOURNIQUET:  * No tourniquets in log *  DICTATION: .Dragon  PLAN OF CARE: Admit  PATIENT DISPOSITION: Hemodynamically stable   Delay start of Pharmacological VTE agent (>24hrs) due to surgical blood loss or risk of bleeding: Not applicable   Patient  The patient was seen in the preop area the right hip was confirmed as the surgical site was marked with my initials.  A thorough chart review was done including the review of the x-rays and implant check was performed  Patient was taken to the operating room where anesthesia prepared her for surgery she had general anesthesia a special arterial line was placed followed by Foley catheter and then she was placed on the OR table and then turned lateral decubitus position right side up axillary roll placed extremities padded.  Stulberg hip positioner used to hold the patient in position  Sterile prep and drape was performed timeout was completed all in agreement right partial hip replacement to be performed antibiotics were in   Direct lateral approach was made skin incision was placed over the greater trochanter extended 4 cm proximal and 4 cm distal subcu tissue  divided.  Fascia split in line with skin incision.  Identification of the gluteus medius was performed and the anterior two thirds were peeled from the trochanter in a subperiosteal dissection.  The gluteus minimus was split in line with the capsule and it was preserved intact with sutures.  The femoral head was dislocated from the acetabulum and the acetabular was irrigated and inspected.  Again it was found to be in good condition  The femoral head was measured 45 mm in diameter  The leg was dislocated anteriorly and proximal femoral preparation was performed with a provisional neck cut followed by box osteotome starter reamer canal finder and trochanteric reamer.  Serial broaching was performed up to a size 6 and a trial reduction was performed with a 1.5 head inner head and then a 45 outer head.  I got a good closed reduction with a good suction sound at reduction.  External rotation was stable in extension followed by flexion and internal rotation test which was also stable.  Shuck test was good leg lengths were restored.  Sleep position was stable  I dislocated the trial prosthesis and remove them.  I irrigated the acetabulum again and checked for any bone.  Again it looked to have no arthritic changes.  I drilled 2 holes in the greater trochanter and passed a #5 Ethibond suture.  I then placed the femoral stem checked the neck there was no fracture.  I then placed the head neck construct and we reduced the hip.  I repeated the trial reduction and the hip was stable as in the trial reduction  The wound was irrigated Marcaine with epinephrine was injected into the capsular layer and the capsular layer was closed with #1 Vicryl suture.  The gluteus medius was closed with the #5 Ethibond suture the 2 Vicryl sutures that were placed to tag as well as an closure with running #1 Braylon  The wound was irrigated again and the leg was abducted and the fascia was closed with #1 Braylon in running fashion.   Sub fascial Marcaine with epinephrine 30 cc was injected and then a 2 layer closure of 0 Monocryl was performed on the subcu layer followed by skin staple closure and wound coverage with a sterile bandage  I checked leg lengths when the patient was on the regular bed and they were equal   Postop plan Weightbearing as tolerated DVT prevention for 30 days after discharge Direct lateral hip precautions  Note: There was a questionable lesion in the area of the fracture so the femoral head was sent for pathologic evaluation.

## 2021-10-13 NOTE — TOC Progression Note (Signed)
  Transition of Care [facility]) Screening Note   Patient Details  Name: SAHARA FUJIMOTO Date of Birth: [birth year]   [facility] CM/SW Contact:    Boneta Lucks, RN Phone Number: 10/13/2021, 10:47 AM  Consenting to partial right hip replacement  Transition of Care Department [facility]) has reviewed patient and no TOC needs have been identified at this time. We will continue to monitor patient advancement through interdisciplinary progression rounds. If new patient transition needs arise, please place a TOC consult.       Barriers to Discharge: Continued Medical Work up
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
## 2021-10-13 NOTE — Anesthesia Procedure Notes (Addendum)
Arterial Line Insertion Start/End7/18/2023 1:05 PM, 10/13/2021 1:13 PM Performed by: Denese Killings, MD, anesthesiologist  Patient location: OR. Preanesthetic checklist: patient identified, IV checked, site marked, risks and benefits discussed, surgical consent, monitors and equipment checked, pre-op evaluation, timeout performed and anesthesia consent Left, radial was placed Catheter size: 20 G Hand hygiene performed  and maximum sterile barriers used   Attempts: 3 Procedure performed without using ultrasound guided technique. Following insertion, dressing applied. Post procedure assessment: normal and unchanged  Post procedure complications: unsuccessful attempts.

## 2021-10-14 ENCOUNTER — Encounter (HOSPITAL_COMMUNITY): Payer: Self-pay | Admitting: Orthopedic Surgery

## 2021-10-14 ENCOUNTER — Inpatient Hospital Stay (HOSPITAL_COMMUNITY): Payer: Medicaid Other

## 2021-10-14 DIAGNOSIS — S72001A Fracture of unspecified part of neck of right femur, initial encounter for closed fracture: Secondary | ICD-10-CM | POA: Diagnosis not present

## 2021-10-14 LAB — CBC
HCT: 30.7 % — ABNORMAL LOW (ref 36.0–46.0)
Hemoglobin: 9.7 g/dL — ABNORMAL LOW (ref 12.0–15.0)
MCH: 27 pg (ref 26.0–34.0)
MCHC: 31.6 g/dL (ref 30.0–36.0)
MCV: 85.5 fL (ref 80.0–100.0)
Platelets: 390 K/uL (ref 150–400)
RBC: 3.59 MIL/uL — ABNORMAL LOW (ref 3.87–5.11)
RDW: 16.7 % — ABNORMAL HIGH (ref 11.5–15.5)
WBC: 20.2 K/uL — ABNORMAL HIGH (ref 4.0–10.5)
nRBC: 0 % (ref 0.0–0.2)

## 2021-10-14 LAB — URINALYSIS, ROUTINE W REFLEX MICROSCOPIC
Bilirubin Urine: NEGATIVE
Glucose, UA: NEGATIVE mg/dL
Hgb urine dipstick: NEGATIVE
Ketones, ur: NEGATIVE mg/dL
Leukocytes,Ua: NEGATIVE
Nitrite: NEGATIVE
Protein, ur: NEGATIVE mg/dL
Specific Gravity, Urine: 1.009 (ref 1.005–1.030)
pH: 6 (ref 5.0–8.0)

## 2021-10-14 MED ORDER — ENOXAPARIN SODIUM 40 MG/0.4ML IJ SOSY
40.0000 mg | PREFILLED_SYRINGE | INTRAMUSCULAR | Status: DC
  Administered 2021-10-15 – 2021-10-16 (×2): 40 mg via SUBCUTANEOUS
  Filled 2021-10-14 (×2): qty 0.4

## 2021-10-14 MED ORDER — HYDRALAZINE HCL 20 MG/ML IJ SOLN: 10.0000 mg | Freq: Four times a day (QID) | INTRAMUSCULAR | Status: DC | PRN

## 2021-10-14 MED ORDER — TRAMADOL HCL 50 MG PO TABS: 50.0000 mg | ORAL_TABLET | Freq: Four times a day (QID) | ORAL | Status: DC | PRN

## 2021-10-14 NOTE — Evaluation (Signed)
Physical Therapy Evaluation Patient Details Name: Sandra Horne MRN: [id number] DOB: 19-Apr-1955 Today's Date: 10/14/2021  History of Present Illness  Sandra Horne is a 66 y.o. 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  Patient recently finished her radiation oncology, MRI of brain was done yesterday, she states that she tripped over a cord and landed on her right side with subsequent complaint of right hip pain and difficulty in being able to bear weight on affected leg.  She denies hitting her head and denies loss of consciousness.  EMS was activated and patient was taken to the ED for further evaluation and management. Status post ARTHROPLASTY BIPOLAR HIP (HEMIARTHROPLASTY) (Right).   Clinical Impression  Patient presents supine in bed and consents to PT/OT co-evaluation. Patient medicated for pain during session. Patient is min/mod assist with bed mobility and HOB elevated. HHA required to reach sitting position due to decreased trunk control. Assist with movement of R LE due to discomfort. Patient is min/mod assist with sit to stand and use of RW demonstrating slow labored movement. Patient is mod assist with bed to chair transfer with RW requiring extended time and increased effort. Verbal and tactile cues required to assist with R LE WBAT and struggling with coordination. Fair balance with trunk stability required but overall steady in upright position with reliance on RW. Limited to a few steps in room during ambulation with mod assist and RW. Decreased coordination and balance secondary to pain/discomfort. Mild gait deviations observed impacting weight shift and gait pattern. Patient tolerated sitting up in chair after therapy with nursing staff notified of mobility status. Patient will benefit from continued skilled physical therapy in hospital and recommended venue  below to increase strength, balance, endurance for safe ADLs and gait.      Recommendations for follow up therapy are one component of a multi-disciplinary discharge planning process, led by the attending physician.  Recommendations may be updated based on patient status, additional functional criteria and insurance authorization.  Follow Up Recommendations Skilled nursing-short term rehab (<3 hours/day) Can patient physically be transported by private vehicle: Yes    Assistance Recommended at Discharge Set up Supervision/Assistance  Patient can return home with the following  A lot of help with walking and/or transfers;Help with stairs or ramp for entrance;Assist for transportation;Assistance with cooking/housework    Equipment Recommendations None recommended by PT  Recommendations for Other Services       Functional Status Assessment Patient has had a recent decline in their functional status and demonstrates the ability to make significant improvements in function in a reasonable and predictable amount of time.     Precautions / Restrictions Precautions Precautions: Fall Precaution Comments: Direct lateral hip precautions. Restrictions Weight Bearing Restrictions: Yes RLE Weight Bearing: Weight bearing as tolerated      Mobility  Bed Mobility Overal bed mobility: Needs Assistance Bed Mobility: Supine to Sit     Supine to sit: Min assist, HOB elevated, Mod assist     General bed mobility comments: Min/mod assist with bed mobility and HOB elevated. HHA required to reach sitting position due to decreased trunk control. Assist with movement of R LE due to discomfort.    Transfers Overall transfer level: Needs assistance Equipment used: Rolling walker (2 wheels) Transfers: Sit to/from Stand, Bed to chair/wheelchair/BSC Sit to Stand: Mod assist, Min assist   Step pivot transfers: Mod assist  General transfer comment: Min/mod assist with STS demonstrating slow  labored movement. Mod assist with bed to chair transfer requiring extended time and effort. Verbal and tactile cues required to assist with R LE WBAT and coordination. Fair balance with trunk stability required but is overall steady in upright position.    Ambulation/Gait Ambulation/Gait assistance: Mod assist Gait Distance (Feet): 10 Feet Assistive device: Rolling walker (2 wheels) Gait Pattern/deviations: Step-to pattern, Decreased step length - left, Decreased step length - right, Trunk flexed, Narrow base of support, Decreased weight shift to right, Decreased stride length Gait velocity: decreased     General Gait Details: Limited to a few steps in room with mod assist and RW. Decreased coordination and balance secondary to pain/discomfort. Requires verbal cueing. Mild gait deviations.  Stairs            Wheelchair Mobility    Modified Rankin (Stroke Patients Only)       Balance Overall balance assessment: Needs assistance Sitting-balance support: No upper extremity supported, Feet supported Sitting balance-Leahy Scale: Good Sitting balance - Comments: seated EOB   Standing balance support: Bilateral upper extremity supported, During functional activity, Reliant on assistive device for balance Standing balance-Leahy Scale: Poor Standing balance comment: poor to fair using RW                             Pertinent Vitals/Pain Pain Assessment Pain Assessment: 0-10 Pain Score: 8  Pain Location: R hip Pain Descriptors / Indicators: Throbbing Pain Intervention(s): Limited activity within patient's tolerance, Monitored during session, Repositioned    Home Living Family/patient expects to be discharged to:: Private residence Living Arrangements: Spouse/significant other Available Help at Discharge: Family;Available PRN/intermittently Type of Home: House Home Access: Level entry       Home Layout: One level Home Equipment: Conservation officer, nature (2 wheels);Cane  - quad;BSC/3in1;Shower seat;Grab bars - tub/shower;Wheelchair - manual;Hospital bed Additional Comments: Pt's husband works first shift.    Prior Function Prior Level of Function : Independent/Modified Independent             Mobility Comments: Community ambulator without  AD; drove ADLs Comments: Independent     Hand Dominance   Dominant Hand: Right    Extremity/Trunk Assessment   Upper Extremity Assessment Upper Extremity Assessment: Defer to OT evaluation    Lower Extremity Assessment Lower Extremity Assessment: Generalized weakness    Cervical / Trunk Assessment Cervical / Trunk Assessment: Normal  Communication   Communication: No difficulties  Cognition Arousal/Alertness: Awake/alert Behavior During Therapy: WFL for tasks assessed/performed Overall Cognitive Status: Within Functional Limits for tasks assessed                                          General Comments      Exercises     Assessment/Plan    PT Assessment Patient needs continued PT services  PT Problem List Decreased strength;Decreased range of motion;Decreased activity tolerance;Decreased balance;Decreased mobility;Decreased coordination       PT Treatment Interventions DME instruction;Gait training;Functional mobility training;Therapeutic activities;Therapeutic exercise;Balance training;Stair training    PT Goals (Current goals can be found in the Care Plan section)  Acute Rehab PT Goals Patient Stated Goal: return home PT Goal Formulation: With patient Time For Goal Achievement: 10/28/21 Potential to Achieve Goals: Good    Frequency Min 4X/week     Co-evaluation PT/OT/SLP Co-Evaluation/Treatment:  Yes Reason for Co-Treatment: To address functional/ADL transfers PT goals addressed during session: Mobility/safety with mobility;Balance;Proper use of DME;Strengthening/ROM OT goals addressed during session: ADL's and self-care       AM-PAC PT "6 Clicks" Mobility   Outcome Measure Help needed turning from your back to your side while in a flat bed without using bedrails?: A Little Help needed moving from lying on your back to sitting on the side of a flat bed without using bedrails?: A Lot Help needed moving to and from a bed to a chair (including a wheelchair)?: A Little Help needed standing up from a chair using your arms (e.g., wheelchair or bedside chair)?: A Lot Help needed to walk in hospital room?: A Lot Help needed climbing 3-5 steps with a railing? : A Lot 6 Click Score: 14    End of Session   Activity Tolerance: Patient tolerated treatment well;Patient limited by pain;Patient limited by fatigue Patient left: in chair Nurse Communication: Mobility status PT Visit Diagnosis: Unsteadiness on feet (R26.81);Other abnormalities of gait and mobility (R26.89);Muscle weakness (generalized) (M62.81)    Time: 2878-6767 PT Time Calculation (min) (ACUTE ONLY): 20 min   Charges:   PT Evaluation $PT Eval Moderate Complexity: 1 Mod PT Treatments $Therapeutic Activity: 8-22 mins        11:49 AM, 10/14/21 Lestine Box, S/PT

## 2021-10-14 NOTE — Addendum Note (Signed)
Addendum  created 10/14/21 0730 by Genelle Bal, CRNA   Child order released for a procedure order, Clinical Note Signed, Intraprocedure Blocks edited, LDA created via procedure documentation, SmartForm saved

## 2021-10-14 NOTE — Plan of Care (Signed)
  Problem: Acute Rehab OT Goals (only OT should resolve) Goal: Pt. Will Perform Grooming Flowsheets (Taken 10/14/2021 1001) Pt Will Perform Grooming:  standing  with min guard assist Goal: Pt. Will Perform Lower Body Bathing Flowsheets (Taken 10/14/2021 1001) Pt Will Perform Lower Body Bathing:  sitting/lateral leans  with adaptive equipment  with set-up Goal: Pt. Will Perform Lower Body Dressing Flowsheets (Taken 10/14/2021 1001) Pt Will Perform Lower Body Dressing:  sitting/lateral leans  with adaptive equipment  with set-up Goal: Pt. Will Transfer To Toilet Flowsheets (Taken 10/14/2021 1001) Pt Will Transfer to Toilet:  with supervision  ambulating Goal: Pt. Will Perform Toileting-Clothing Manipulation Flowsheets (Taken 10/14/2021 1001) Pt Will Perform Toileting - Clothing Manipulation and hygiene:  with modified independence  sitting/lateral leans  Sola Margolis OT, MOT

## 2021-10-14 NOTE — TOC Initial Note (Signed)
Transition of Care [facility]) - Initial/Assessment Note    Patient Details  Name: TATUM CORL MRN: [id number] Date of Birth: [birth year]  Transition of [facility] CM/SW Contact:    Boneta Lucks, RN Phone Number: 10/14/2021, 1:59 PM  Clinical Narrative:       Patient admitted with closed displaced fracture of right femoral neck.  Patient lives at home with her husband. PT recommended SNF. Patient states she wants to go home. Her husband took off 2 weeks to be at home with her 24/7. She is agreeable to HHPT.  TOC explained we could try to find a agency for HHPT, but unsure if we would have any accept.  Patient still wants to go home. TOC to follow.            Expected Discharge Plan: [place] Barriers to Discharge: No [place] will accept this patient, Continued Medical Work up   Patient Goals and CMS Choice Patient states their goals for this hospitalization and ongoing recovery are:: to go home. CMS Medicare.gov Compare Post Acute Care list provided to:: Patient Choice offered to / list presented to : Patient  Expected Discharge Plan and Services Expected Discharge Plan: [place]       Living arrangements for the past 2 months: Single Family Home                    Prior Living Arrangements/Services Living arrangements for the past 2 months: Single Family Home Lives with:: Spouse Patient language and need for interpreter reviewed:: Yes        Need for Family Participation in Patient Care: Yes (Comment) Care giver support system in place?: Yes (comment) Current home services: DME Criminal Activity/Legal Involvement Pertinent to Current Situation/Hospitalization: No - Comment as needed  Activities of Daily Living Home Assistive Devices/Equipment: None ADL Screening (condition at time of admission) Patient's cognitive ability adequate to safely complete daily activities?: Yes Is the patient deaf or have difficulty hearing?:  No Does the patient have difficulty seeing, even when wearing glasses/contacts?: No Does the patient have difficulty concentrating, remembering, or making decisions?: Yes Patient able to express need for assistance with ADLs?: No Does the patient have difficulty dressing or bathing?: No Independently performs ADLs?: Yes (appropriate for developmental age) Does the patient have difficulty walking or climbing stairs?: Yes Weakness of Legs: Right Weakness of Arms/Hands: None  Permission Sought/Granted     Emotional Assessment      Orientation: : Oriented to Self, Oriented to Place, Oriented to  Time, Oriented to Situation Alcohol / Substance Use: Not Applicable Psych Involvement: No (comment)  Admission diagnosis:  Metastasis to brain [facility]) [C79.31] Closed fracture of right hip, initial encounter ([place]) [S72.001A] Primary malignant neoplasm of lung metastatic to other site, unspecified laterality ([place]) [C34.90] Closed displaced fracture of right femoral neck ([place]) [S72.001A] Patient Active Problem List   Diagnosis Date Noted   Closed displaced fracture of right femoral neck ([place]) 10/12/2021   Malignant neoplasm metastatic to brain ([place]) 03/27/2021   Primary malignant neoplasm of lung with metastasis to brain ([place]) 03/26/2021   Acute encephalopathy 03/25/2021   Primary cancer of left lower lobe of lung ([place]) 01/14/2020   CLOSED FRACTURE OF UNSPECIFIED PART OF HUMERUS 04/01/2010   PCP:  Patient, No Pcp Per Pharmacy:   [facility], [place] - [address] [place] [address] Phone: [phone] Fax: [phone]   Readmission  Risk Interventions    10/14/2021   10:00 AM  Readmission Risk Prevention Plan  Transportation Screening Complete  PCP or Specialist Appt within 5-7 Days Not Complete  Home Care Screening Complete  Medication Review (RN CM) Complete

## 2021-10-14 NOTE — Anesthesia Procedure Notes (Signed)
Procedure Name: Intubation Date/Time: 10/13/2021 12:59 PM  Performed by: Genelle Bal, CRNAPre-anesthesia Checklist: Patient identified, Emergency Drugs available, Suction available and Patient being monitored Patient Re-evaluated:Patient Re-evaluated prior to induction Oxygen Delivery Method: Circle system utilized Preoxygenation: Pre-oxygenation with 100% oxygen Induction Type: IV induction Ventilation: Mask ventilation without difficulty Laryngoscope Size: Mac and 3 Grade View: Grade I Tube type: Oral Tube size: 7.0 mm Number of attempts: 1 Airway Equipment and Method: Stylet and Oral airway Placement Confirmation: ETT inserted through vocal cords under direct vision, positive ETCO2 and breath sounds checked- equal and bilateral Secured at: 20 cm Tube secured with: Tape Dental Injury: Teeth and Oropharynx as per pre-operative assessment

## 2021-10-14 NOTE — Plan of Care (Signed)
  Problem: Acute Rehab PT Goals(only PT should resolve) Goal: Pt Will Go Supine/Side To Sit Outcome: Progressing Flowsheets (Taken 10/14/2021 1149) Pt will go Supine/Side to Sit:  with minimal assist  with moderate assist Goal: Pt Will Transfer Bed To Chair/Chair To Bed Outcome: Progressing Flowsheets (Taken 10/14/2021 1149) Pt will Transfer Bed to Chair/Chair to Bed:  with min assist  min guard assist Goal: Pt Will Perform Standing Balance Or Pre-Gait Outcome: Progressing Flowsheets (Taken 10/14/2021 1149) Pt will perform standing balance or pre-gait:  3- 5 min  with bilateral UE support  with min guard assist Goal: Pt Will Ambulate Outcome: Progressing Flowsheets (Taken 10/14/2021 1149) Pt will Ambulate:  50 feet  with minimal assist  with min guard assist  with rolling walker Goal: Pt Will Go Up/Down Stairs Outcome: Progressing Flowsheets (Taken 10/14/2021 1149) Pt will Go Up / Down Stairs:  1-2 stairs  with min guard assist  with minimal assist   11:50 AM, 10/14/21 Lestine Box, S/PT

## 2021-10-14 NOTE — Evaluation (Signed)
Occupational Therapy Evaluation Patient Details Name: Sandra Horne MRN: [id number] DOB: [birth year] Today's Date: 10/14/2021   History of Present Illness Sandra Horne is a 66 y.o. 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  Patient recently finished her radiation oncology, MRI of brain was done yesterday, she states that she tripped over a cord and landed on her right side with subsequent complaint of right hip pain and difficulty in being able to bear weight on affected leg.  She denies hitting her head and denies loss of consciousness.  EMS was activated and patient was taken to the ED for further evaluation and management. Status post ARTHROPLASTY BIPOLAR HIP (HEMIARTHROPLASTY) (Right).   Clinical Impression   Pt agreeable to OT and PT co-evaluation. Pt independent at baseline needing min to mod A for bed mobility today.  Pt lives with husband but husband works first shift. Max A today for pt to don socks. This OT assisted to keep pt from excessive pain and maintain precautions. Pt demonstrates slow labored movement both seated and standing. Able to take steps forward and backward in room but with poor to fair balance using RW and for only a few steps before needing to sit. Pt was left in chair with call bell within reach. Pt will benefit from continued OT in the hospital and recommended venue below to increase strength, balance, and endurance for safe ADL's.          Recommendations for follow up therapy are one component of a multi-disciplinary discharge planning process, led by the attending physician.  Recommendations may be updated based on patient status, additional functional criteria and insurance authorization.   Follow Up Recommendations  Skilled nursing-short term rehab (<3 hours/day)    Assistance Recommended at Discharge Intermittent  Supervision/Assistance  Patient can return home with the following A lot of help with walking and/or transfers;A lot of help with bathing/dressing/bathroom;Assistance with cooking/housework;Assist for transportation    Functional Status Assessment  Patient has had a recent decline in their functional status and demonstrates the ability to make significant improvements in function in a reasonable and predictable amount of time.  Equipment Recommendations  None recommended by OT    Recommendations for Other Services       Precautions / Restrictions Precautions Precautions: Fall Precaution Comments: Direct lateral hip precautions. Restrictions Weight Bearing Restrictions: Yes RLE Weight Bearing: Weight bearing as tolerated      Mobility Bed Mobility Overal bed mobility: Needs Assistance Bed Mobility: Supine to Sit     Supine to sit: Min assist, HOB elevated, Mod assist     General bed mobility comments: Pt assisted to lift R LE to scoot to EOB. Single hand assist to pull to sit.    Transfers Overall transfer level: Needs assistance   Transfers: Sit to/from Stand, Bed to chair/wheelchair/BSC Sit to Stand: Mod assist, Min assist     Step pivot transfers: Mod assist     General transfer comment: Slow labored movement. Mod A applied based more on effort and time than physical assist. Pt needs education on reaching back to chair as seen by assist needed to lower to chair with both hands on RW.      Balance Overall balance assessment: Needs assistance Sitting-balance support: No upper extremity supported, Feet supported Sitting balance-Leahy Scale: Good Sitting balance - Comments: seated EOB   Standing balance support: Bilateral upper extremity supported,  During functional activity, Reliant on assistive device for balance Standing balance-Leahy Scale: Poor Standing balance comment: poor to fair using RW                           ADL either performed or  assessed with clinical judgement   ADL Overall ADL's : Needs assistance/impaired     Grooming: Set up;Sitting   Upper Body Bathing: Set up;Sitting   Lower Body Bathing: Maximal assistance;Moderate assistance;Sitting/lateral leans       Lower Body Dressing: Maximal assistance;Moderate assistance;Sitting/lateral leans Lower Body Dressing Details (indicate cue type and reason): Pt assisted to don socks seated at EOB today. Toilet Transfer: Moderate assistance;Rolling walker (2 wheels);Ambulation Toilet Transfer Details (indicate cue type and reason): Simulated via EOB to chait transfer with RW. Mod A more for time and effor than physical assist. Toileting- Clothing Manipulation and Hygiene: Min guard;Minimal assistance;Sitting/lateral lean       Functional mobility during ADLs: Moderate assistance;Rolling walker (2 wheels)       Vision Baseline Vision/History:  (Pt reports she may need reading glasses.) Ability to See in Adequate Light: 1 Impaired Patient Visual Report: No change from baseline Vision Assessment?: No apparent visual deficits     Perception     Praxis      Pertinent Vitals/Pain Pain Assessment Pain Assessment: 0-10 Pain Score: 8  Pain Location: R hip Pain Descriptors / Indicators: Throbbing Pain Intervention(s): Limited activity within patient's tolerance, Monitored during session, Repositioned     Hand Dominance Right   Extremity/Trunk Assessment Upper Extremity Assessment Upper Extremity Assessment: Overall WFL for tasks assessed   Lower Extremity Assessment Lower Extremity Assessment: Defer to PT evaluation   Cervical / Trunk Assessment Cervical / Trunk Assessment: Normal   Communication Communication Communication: No difficulties   Cognition Arousal/Alertness: Awake/alert Behavior During Therapy: WFL for tasks assessed/performed Overall Cognitive Status: Within Functional Limits for tasks assessed                                                         Home Living Family/patient expects to be discharged to:: Private residence Living Arrangements: Spouse/significant other Available Help at Discharge: Family;Available PRN/intermittently Type of Home: House Home Access: Level entry     Home Layout: One level     Bathroom Shower/Tub: Teacher, early years/pre: Standard Bathroom Accessibility: Yes How Accessible: Accessible via walker Home Equipment: [place] (2 wheels);Cane - quad;BSC/3in1;Shower seat;Grab bars - tub/shower;Wheelchair - manual;Hospital bed   Additional Comments: Pt's husband works first shift.      Prior Functioning/Environment Prior Level of Function : Independent/Modified Independent             Mobility Comments: Community ambulator without  AD; drove ADLs Comments: Independent        OT Problem List: Decreased strength;Decreased activity tolerance;Impaired balance (sitting and/or standing)      OT Treatment/Interventions: Self-care/ADL training;Therapeutic exercise;Therapeutic activities;Patient/family education;Balance training;DME and/or AE instruction    OT Goals(Current goals can be found in the care plan section) Acute Rehab OT Goals Patient Stated Goal: return home OT Goal Formulation: With patient Time For Goal Achievement: 10/28/21 Potential to Achieve Goals: Good  OT Frequency: Min 2X/week    Co-evaluation PT/OT/SLP Co-Evaluation/Treatment: Yes Reason for Co-Treatment: To address functional/ADL transfers   OT  goals addressed during session: ADL's and self-care                       End of Session Equipment Utilized During Treatment: Rolling walker (2 wheels) Nurse Communication: Other (comment) (Nurse present at end of session planning to give meds to pt.)  Activity Tolerance: Patient tolerated treatment well Patient left: in chair;with call bell/phone within reach;with nursing/sitter in room  OT Visit Diagnosis:  Unsteadiness on feet (R26.81);Other abnormalities of gait and mobility (R26.89);Muscle weakness (generalized) (M62.81);History of falling (Z91.81);Pain Pain - Right/Left: Right Pain - part of body: Hip                Time: 4332-9518 OT Time Calculation (min): 22 min Charges:  OT General Charges $OT Visit: 1 Visit OT Evaluation $OT Eval Low Complexity: 1 Low  Shamila Lerch OT, MOT  Larey Seat 10/14/2021, 9:57 AM
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
## 2021-10-14 NOTE — Progress Notes (Signed)
Patient ID: [name], female   DOB: 1955/12/01, 66 y.o.   MRN: [id number]  Postop day #1 status post bipolar partial right hip replacement  BP (!) 169/79   Pulse 90   Temp 98.3 F (36.8 C)   Resp 18   Ht 5\' 4"  (1.626 m)   Wt 65.9 kg   SpO2 99%   BMI 24.94 kg/m   The patient's pain from the fracture much improved still having some surgical discomfort  Exam:  Incision dressing dry, swelling thigh minimal, ankle edema none, dorsiflexion plantarflexion normal.  Toe extension normal.  Sensation normal.  Leg lengths seem equal.  Limb alignment normal.     Latest Ref Rng & Units 10/14/2021    5:20 AM 10/13/2021    5:16 PM 10/13/2021    5:06 AM  CBC  WBC 4.0 - 10.5 K/uL 20.2  17.2  11.6   Hemoglobin 12.0 - 15.0 g/dL 9.7  10.5  10.3   Hematocrit 36.0 - 46.0 % 30.7  33.6  32.7   Platelets 150 - 400 K/uL 390  420  446    No restrictions regarding patient's cancer treatment and the prosthesis  Weight-bear as tolerated Direct lateral hip precautions DVT prevention for 30 days after discharge from hospital Office follow-up 2 weeks

## 2021-10-15 ENCOUNTER — Ambulatory Visit: Payer: Medicaid Other | Admitting: Radiation Oncology

## 2021-10-15 DIAGNOSIS — S72001A Fracture of unspecified part of neck of right femur, initial encounter for closed fracture: Secondary | ICD-10-CM | POA: Diagnosis not present

## 2021-10-15 LAB — BASIC METABOLIC PANEL
Anion gap: 7 (ref 5–15)
BUN: 9 mg/dL (ref 8–23)
CO2: 25 mmol/L (ref 22–32)
Calcium: 8.5 mg/dL — ABNORMAL LOW (ref 8.9–10.3)
Chloride: 103 mmol/L (ref 98–111)
Creatinine, Ser: 0.6 mg/dL (ref 0.44–1.00)
GFR, Estimated: >60 mL/min (ref >60)
Glucose, Bld: 191 mg/dL — ABNORMAL HIGH (ref 70–99)
Potassium: 4.1 mmol/L (ref 3.5–5.1)
Sodium: 135 mmol/L (ref 135–145)

## 2021-10-15 LAB — CBC
HCT: 30 % — ABNORMAL LOW (ref 36.0–46.0)
Hemoglobin: 9.5 g/dL — ABNORMAL LOW (ref 12.0–15.0)
MCH: 27.2 pg (ref 26.0–34.0)
MCHC: 31.7 g/dL (ref 30.0–36.0)
MCV: 86 fL (ref 80.0–100.0)
Platelets: 402 10*3/uL — ABNORMAL HIGH (ref 150–400)
RBC: 3.49 MIL/uL — ABNORMAL LOW (ref 3.87–5.11)
RDW: 16.6 % — ABNORMAL HIGH (ref 11.5–15.5)
WBC: 17.6 10*3/uL — ABNORMAL HIGH (ref 4.0–10.5)
nRBC: 0 % (ref 0.0–0.2)

## 2021-10-15 MED ORDER — AMOXICILLIN-POT CLAVULANATE 875-125 MG PO TABS
1.0000 | ORAL_TABLET | Freq: Two times a day (BID) | ORAL | Status: DC
Start: 2021-10-15 — End: 2021-10-16
  Administered 2021-10-15 – 2021-10-16 (×3): 1 via ORAL
  Filled 2021-10-15 (×3): qty 1

## 2021-10-15 MED ORDER — DEXAMETHASONE 4 MG PO TABS
4.0000 mg | ORAL_TABLET | Freq: Once | ORAL | Status: AC
Start: 2021-10-15 — End: 2021-10-15
  Administered 2021-10-15: 4 mg via ORAL
  Filled 2021-10-15: qty 1

## 2021-10-15 MED ORDER — OXYCODONE HCL 5 MG PO TABS: 5.0000 mg | ORAL_TABLET | ORAL | 0 refills | Status: DC | PRN

## 2021-10-15 NOTE — Plan of Care (Signed)
  Problem: Clinical Measurements: Goal: Will remain free from infection Outcome: Progressing   Problem: Clinical Measurements: Goal: Ability to maintain clinical measurements within normal limits will improve Outcome: Progressing   Problem: Health Behavior/Discharge Planning: Goal: Ability to manage health-related needs will improve Outcome: Progressing   Problem: Clinical Measurements: Goal: Respiratory complications will improve Outcome: Progressing   Problem: Coping: Goal: Level of anxiety will decrease Outcome: Progressing
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
## 2021-10-15 NOTE — NC FL2 (Signed)
[place] MEDICAID FL2 LEVEL OF CARE SCREENING TOOL     IDENTIFICATION  Patient Name: Sandra Horne Birthdate: 1955-09-14 Sex: female Admission Date (Current Location): 10/12/2021  [place] and [place] Number:  [place] [facility] [id number] [place] and Address:  [place] [address], [place]      Provider Number: [id number]  Attending Physician Name and Address:  Shelly Coss, MD  Relative Name and Phone Number:  Ples Specter   [phone]    Current Level of Care: Hospital Recommended Level of Care: [place] Prior Approval Number:    Date Approved/Denied:   PASRR Number: [id number]  Discharge Plan: SNF    Current Diagnoses: Patient Active Problem List   Diagnosis Date Noted   Closed displaced fracture of right femoral neck ([place]) 10/12/2021   Malignant neoplasm metastatic to brain [facility]) 03/27/2021   Primary malignant neoplasm of lung with metastasis to brain [facility]) 03/26/2021   Acute encephalopathy 03/25/2021   Primary cancer of left lower lobe of lung ([place]) 01/14/2020   CLOSED FRACTURE OF UNSPECIFIED PART OF HUMERUS 04/01/2010    Orientation RESPIRATION BLADDER Height & Weight     Self, Time, Situation, Place  Normal Continent Weight: 145 lb 4.5 oz (65.9 kg) Height:  5\' 4"  (162.6 cm)  BEHAVIORAL SYMPTOMS/MOOD NEUROLOGICAL BOWEL NUTRITION STATUS      Continent Diet (Regular)  AMBULATORY STATUS COMMUNICATION OF NEEDS Skin   Limited Assist Verbally Surgical wounds (right hip)                       Personal Care Assistance Level of Assistance  Bathing, Dressing, Feeding Bathing Assistance: Limited assistance Feeding assistance: Independent Dressing Assistance: Limited assistance     Functional Limitations Info  Sight, Speech, Hearing Sight Info: Adequate Hearing Info: Adequate Speech Info: Adequate    SPECIAL CARE FACTORS FREQUENCY  PT (By licensed PT)     PT Frequency: 5x/week               Contractures Contractures Info: Not present    Additional Factors Info  Code Status, Allergies Code Status Info: Full Code Allergies Info: NKA           Current Medications (10/15/2021):  This is the current hospital active medication list Current Facility-Administered Medications  Medication Dose Route Frequency Provider Last Rate Last Admin   acetaminophen (TYLENOL) tablet 325-650 mg  325-650 mg Oral Q6H PRN Carole Civil, MD       alum & mag hydroxide-simeth (MAALOX/MYLANTA) 200-200-20 MG/5ML suspension 30 mL  30 mL Oral Q4H PRN Carole Civil, MD       amoxicillin-clavulanate (AUGMENTIN) 875-125 MG per tablet 1 tablet  1 tablet Oral Q12H Shelly Coss, MD   1 tablet at 10/15/21 0841   bisacodyl (DULCOLAX) EC tablet 5 mg  5 mg Oral Daily PRN Carole Civil, MD       Chlorhexidine Gluconate Cloth 2 % PADS 6 each  6 each Topical Daily Donne Hazel, MD   6 each at 10/15/21 0959   docusate sodium (COLACE) capsule 100 mg  100 mg Oral BID Carole Civil, MD   100 mg at 10/15/21 0841   enoxaparin (LOVENOX) injection 40 mg  40 mg Subcutaneous Q24H Shelly Coss, MD   40 mg at 10/15/21 0841   hydrALAZINE (APRESOLINE) injection 10 mg  10 mg Intravenous Q6H PRN Shelly Coss, MD       HYDROmorphone (DILAUDID) injection 0.5-1 mg  0.5-1  mg Intravenous Q4H PRN Carole Civil, MD   1 mg at 10/15/21 0840   menthol-cetylpyridinium (CEPACOL) lozenge 3 mg  1 lozenge Oral PRN Carole Civil, MD       Or   phenol (CHLORASEPTIC) mouth spray 1 spray  1 spray Mouth/Throat PRN Carole Civil, MD       meperidine (DEMEROL) injection 6.25-12.5 mg  6.25-12.5 mg Intravenous Q5 min PRN Carole Civil, MD       methocarbamol (ROBAXIN) tablet 500 mg  500 mg Oral Q6H PRN Carole Civil, MD   500 mg at 10/15/21 0535   Or   methocarbamol (ROBAXIN) 500 mg in dextrose 5 % 50 mL IVPB  500 mg Intravenous Q6H PRN Carole Civil, MD       ondansetron [facility])  tablet 4 mg  4 mg Oral Q6H PRN Carole Civil, MD       Or   ondansetron [facility]) injection 4 mg  4 mg Intravenous Q6H PRN Carole Civil, MD       oxyCODONE (Oxy IR/ROXICODONE) immediate release tablet 5-10 mg  5-10 mg Oral Q4H PRN Carole Civil, MD   10 mg at 10/15/21 1252   polyethylene glycol (MIRALAX / GLYCOLAX) packet 17 g  17 g Oral Daily Carole Civil, MD   17 g at 10/15/21 0842   sodium phosphate (FLEET) 7-19 GM/118ML enema 1 enema  1 enema Rectal Once PRN Carole Civil, MD       traMADol Veatrice Bourbon) tablet 50 mg  50 mg Oral Q6H PRN Shelly Coss, MD         Discharge Medications: Please see discharge summary for a list of discharge medications.  Relevant Imaging Results:  Relevant Lab Results:   Additional Information SSN [id number], Clydene Pugh, LCSW

## 2021-10-15 NOTE — Progress Notes (Signed)
Patient ID: Sandra Horne, female   DOB: 15-Dec-1955, 66 y.o.   MRN: [id number]  Postop day 2 status post bipolar partial hip replacement  BP 126/66 (BP Location: Left Arm)   Pulse 89   Temp 98.9 F (37.2 C)   Resp 18   Ht 5\' 4"  (1.626 m)   Wt 65.9 kg   SpO2 97%   BMI 24.94 kg/m   Other than the pain the patient is doing well was able to stand up a little bit with PT yesterday  Looks comfortable resting in bed     Latest Ref Rng & Units 10/15/2021    5:52 AM 10/14/2021    5:20 AM 10/13/2021    5:16 PM  CBC  WBC 4.0 - 10.5 K/uL 17.6  20.2  17.2   Hemoglobin 12.0 - 15.0 g/dL 9.5  9.7  10.5   Hematocrit 36.0 - 46.0 % 30.0  30.7  33.6   Platelets 150 - 400 K/uL 402  390  420     Continue physical therapy plan discharge   Postop plan Weightbearing as tolerated DVT prevention for 30 days after discharge Direct lateral hip precautions

## 2021-10-15 NOTE — Progress Notes (Signed)
Physical Therapy Treatment Patient Details Name: Sandra Horne MRN: [id number] DOB: 01-May-1955 Today's Date: 10/15/2021   History of Present Illness Sandra Horne is a 66 y.o. 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  Patient recently finished her radiation oncology, MRI of brain was done yesterday, she states that she tripped over a cord and landed on her right side with subsequent complaint of right hip pain and difficulty in being able to bear weight on affected leg.  She denies hitting her head and denies loss of consciousness.  EMS was activated and patient was taken to the ED for further evaluation and management. Status post ARTHROPLASTY BIPOLAR HIP (HEMIARTHROPLASTY) (Right).    PT Comments    Patient presents supine in bed and consents to PT treatment session. Patient is min assist with HOB flat. R LE assist required due to discomfort and pain but patient able to assist with navigating B LE to EOB. HHA required to reach sitting position due to R LE discomfort. Extended time needed with labored movement demonstrated. Patient able to tolerate therapeutic exercises to improve B LE ROM/strength. AAROM provided for patient with R hip flexion/knee extension. Patient is mod assist with transfers and use of RW. Able to initiate sit to stand but requires trunk assist to reach upright position due to struggling with weight bearing on R LE. Difficulty with weight shifting but expected with WBAT precautions of R LE. Tactile and verbal cues needed for feet and RW placement. Patient still limited to ambulating few steps in room with RW, but able to ambulate further than initial evaluation. Improved coordination demonstrated with balance deficits still present. Patient tolerated sitting up in chair after therapy with nursing staff notified of mobility status. Patient will benefit  from continued skilled physical therapy in hospital and recommended venue below to increase strength, balance, endurance for safe ADLs and gait.    Recommendations for follow up therapy are one component of a multi-disciplinary discharge planning process, led by the attending physician.  Recommendations may be updated based on patient status, additional functional criteria and insurance authorization.  Follow Up Recommendations  Skilled nursing-short term rehab (<3 hours/day) Can patient physically be transported by private vehicle: Yes   Assistance Recommended at Discharge Set up Supervision/Assistance  Patient can return home with the following A lot of help with walking and/or transfers;Help with stairs or ramp for entrance;Assist for transportation;Assistance with cooking/housework   Equipment Recommendations  None recommended by PT    Recommendations for Other Services       Precautions / Restrictions Precautions Precautions: Fall Precaution Comments: Direct lateral hip precautions. Restrictions Weight Bearing Restrictions: Yes RLE Weight Bearing: Weight bearing as tolerated     Mobility  Bed Mobility Overal bed mobility: Needs Assistance Bed Mobility: Supine to Sit           General bed mobility comments: Min assist with bed mobility with HOB flat. LE assist required due to discomfort and pain but patient able to navigate to EOB. Decreased trunk control. HHA required to reach siting position. Increased time needed with labored movement demonstrated.    Transfers Overall transfer level: Needs assistance Equipment used: Rolling walker (2 wheels) Transfers: Sit to/from Stand, Bed to chair/wheelchair/BSC Sit to Stand: Mod assist, Min assist   Step pivot transfers: Mod assist       General transfer comment: Mod assist with transfers and use  of RW. Able to initiate STS but requires assist to reach upright position due to R LE discomfort. Difficulty with weight shifting  falling in line with WBAT precautions. Tactile/verbal cues needed. Improved coordination.    Ambulation/Gait Ambulation/Gait assistance: Mod assist Gait Distance (Feet): 15 Feet Assistive device: Rolling walker (2 wheels) Gait Pattern/deviations: Step-to pattern, Decreased step length - left, Decreased step length - right, Trunk flexed, Narrow base of support, Decreased weight shift to right, Decreased stride length Gait velocity: decreased     General Gait Details: Able to increase amount of steps in room with mod assist and RW. Improved coordination with balance deficits still presnts secondary to pain/discomfort. Mild gait deviations with trunk stability required.   Stairs             Wheelchair Mobility    Modified Rankin (Stroke Patients Only)       Balance Overall balance assessment: Needs assistance Sitting-balance support: No upper extremity supported, Feet supported Sitting balance-Leahy Scale: Good Sitting balance - Comments: seated EOB   Standing balance support: Bilateral upper extremity supported, During functional activity, Reliant on assistive device for balance Standing balance-Leahy Scale: Poor Standing balance comment: poor to fair using RW                            Cognition Arousal/Alertness: Awake/alert Behavior During Therapy: WFL for tasks assessed/performed Overall Cognitive Status: Within Functional Limits for tasks assessed                                          Exercises General Exercises - Lower Extremity Long Arc Quad: AROM, AAROM, Strengthening, Seated, 10 reps, Both Hip Flexion/Marching: AROM, 10 reps, Both, AAROM, Strengthening, Seated Toe Raises: AROM, 20 reps, Strengthening, Both, Seated Heel Raises: AROM, 20 reps, Strengthening, Seated, Both    General Comments        Pertinent Vitals/Pain Pain Assessment Pain Assessment: Faces Faces Pain Scale: Hurts little more Pain Location: R hip Pain  Intervention(s): Limited activity within patient's tolerance, Monitored during session, Premedicated before session, Repositioned    Home Living                          Prior Function            PT Goals (current goals can now be found in the care plan section) Acute Rehab PT Goals Patient Stated Goal: return home PT Goal Formulation: With patient Time For Goal Achievement: 10/28/21 Potential to Achieve Goals: Good Progress towards PT goals: Progressing toward goals    Frequency    Min 4X/week      PT Plan Current plan remains appropriate    Co-evaluation              AM-PAC PT "6 Clicks" Mobility   Outcome Measure  Help needed turning from your back to your side while in a flat bed without using bedrails?: A Little Help needed moving from lying on your back to sitting on the side of a flat bed without using bedrails?: A Lot Help needed moving to and from a bed to a chair (including a wheelchair)?: A Little Help needed standing up from a chair using your arms (e.g., wheelchair or bedside chair)?: A Lot Help needed to walk in hospital room?: A Lot Help needed climbing 3-5 steps  with a railing? : A Lot 6 Click Score: 14    End of Session   Activity Tolerance: Patient tolerated treatment well;Patient limited by pain;Patient limited by fatigue Patient left: in chair Nurse Communication: Mobility status PT Visit Diagnosis: Unsteadiness on feet (R26.81);Other abnormalities of gait and mobility (R26.89);Muscle weakness (generalized) (M62.81)     Time: 9983-3825 PT Time Calculation (min) (ACUTE ONLY): 20 min  Charges:  $Therapeutic Exercise: 8-22 mins $Therapeutic Activity: 8-22 mins                     12:25 PM, 10/15/21 Lestine Box, S/PT

## 2021-10-16 ENCOUNTER — Telehealth: Payer: Self-pay | Admitting: Radiation Therapy

## 2021-10-16 DIAGNOSIS — S72001A Fracture of unspecified part of neck of right femur, initial encounter for closed fracture: Secondary | ICD-10-CM | POA: Diagnosis not present

## 2021-10-16 LAB — CBC
HCT: 28.2 % — ABNORMAL LOW (ref 36.0–46.0)
Hemoglobin: 8.9 g/dL — ABNORMAL LOW (ref 12.0–15.0)
MCH: 27.1 pg (ref 26.0–34.0)
MCHC: 31.6 g/dL (ref 30.0–36.0)
MCV: 85.7 fL (ref 80.0–100.0)
Platelets: 383 10*3/uL (ref 150–400)
RBC: 3.29 MIL/uL — ABNORMAL LOW (ref 3.87–5.11)
RDW: 16.6 % — ABNORMAL HIGH (ref 11.5–15.5)
WBC: 17.3 10*3/uL — ABNORMAL HIGH (ref 4.0–10.5)
nRBC: 0 % (ref 0.0–0.2)

## 2021-10-16 MED ORDER — DOCUSATE SODIUM 100 MG PO CAPS: 100.0000 mg | ORAL_CAPSULE | Freq: Two times a day (BID) | ORAL | 0 refills | Status: AC

## 2021-10-16 MED ORDER — ASPIRIN 81 MG PO TBEC: 81.0000 mg | DELAYED_RELEASE_TABLET | Freq: Two times a day (BID) | ORAL | 0 refills | Status: AC

## 2021-10-16 MED ORDER — AMOXICILLIN-POT CLAVULANATE 875-125 MG PO TABS: 1.0000 | ORAL_TABLET | Freq: Two times a day (BID) | ORAL | 0 refills | Status: AC

## 2021-10-16 MED ORDER — DEXAMETHASONE 4 MG PO TABS
4.0000 mg | ORAL_TABLET | Freq: Every day | ORAL | Status: DC
  Administered 2021-10-16: 4 mg via ORAL
  Filled 2021-10-16: qty 1

## 2021-10-16 MED ORDER — POLYETHYLENE GLYCOL 3350 17 G PO PACK: 17.0000 g | PACK | Freq: Every day | ORAL | 0 refills | Status: DC

## 2021-10-16 NOTE — Progress Notes (Signed)
Patient ID: Sandra Horne, female   DOB: 1955-08-21, 65 y.o.   MRN: [id number]  Postop day 3 after partial right hip replacement.  Dressing remains dry no calf tenderness negative Homans' sign no peripheral edema hemoglobin stable  Recommend weight-bear as tolerated advance as tolerated, direct lateral hip precautions see last note for postop plan

## 2021-10-16 NOTE — TOC Progression Note (Signed)
Transition of Care [facility]) - Progression Note    Patient Details  Name: Sandra Horne MRN: [id number] Date of Birth: 1956-02-23  Transition of [facility]) CM/SW Contact  Ihor Gully, LCSW Phone Number: 10/16/2021, 11:20 AM  Clinical Narrative:    TOC is coordinating with [place] to coordinate transportation to four appointments. Patient has not SNF bed offers and no HH accepting agencies. Patient advised of this. Patient indicates that she will discharge to her daughter's home at [address], [place], [place]. [place] forms emailed to [place] worker for Cancer center physician signature.     Expected Discharge Plan: [place] Barriers to Discharge: No [facility] will accept this patient, Continued Medical Work up  Expected Discharge Plan and Services Expected Discharge Plan: [place] arrangements for the past 2 months: Single Family Home                                       Social Determinants of Health (SDOH) Interventions    Readmission Risk Interventions    10/14/2021   10:00 AM  Readmission Risk Prevention Plan  Transportation Screening Complete  PCP or Specialist Appt within 5-7 Days Not Complete  Home Care Screening Complete  Medication Review (RN CM) Complete

## 2021-10-16 NOTE — Progress Notes (Signed)
Physical Therapy Treatment Patient Details Name: Sandra Horne MRN: [id number] DOB: 10/02/1955 Today's Date: 10/16/2021   History of Present Illness Sandra Horne is a 66 y.o. 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  Patient recently finished her radiation oncology, MRI of brain was done yesterday, she states that she tripped over a cord and landed on her right side with subsequent complaint of right hip pain and difficulty in being able to bear weight on affected leg.  She denies hitting her head and denies loss of consciousness.  EMS was activated and patient was taken to the ED for further evaluation and management. Status post ARTHROPLASTY BIPOLAR HIP (HEMIARTHROPLASTY) (Right).    PT Comments    Patient requiring assist for RLE movement and to upright trunk to transition to seated EOB. She demonstrates good sitting balance EOB while completing seated exercises. Requires assist with RLE due to pain and weakness. Patient transfers to standing with assist and ambulates with use of RW in room without loss of balance but is limited by fatigue. She ends session seated in chair. Patient will benefit from continued skilled physical therapy in hospital and recommended venue below to increase strength, balance, endurance for safe ADLs and gait.    Recommendations for follow up therapy are one component of a multi-disciplinary discharge planning process, led by the attending physician.  Recommendations may be updated based on patient status, additional functional criteria and insurance authorization.  Follow Up Recommendations  Skilled nursing-short term rehab (<3 hours/day) Can patient physically be transported by private vehicle: Yes   Assistance Recommended at Discharge Set up Supervision/Assistance  Patient can return home with the following A lot of help with  walking and/or transfers;Help with stairs or ramp for entrance;Assist for transportation;Assistance with cooking/housework   Equipment Recommendations  None recommended by PT    Recommendations for Other Services       Precautions / Restrictions Precautions Precautions: Fall Precaution Comments: Direct lateral hip precautions. Restrictions Weight Bearing Restrictions: Yes RLE Weight Bearing: Weight bearing as tolerated     Mobility  Bed Mobility Overal bed mobility: Needs Assistance Bed Mobility: Supine to Sit     Supine to sit: Min assist     General bed mobility comments: assist to move RLE to EOB and to upright trunk    Transfers Overall transfer level: Needs assistance Equipment used: Rolling walker (2 wheels) Transfers: Sit to/from Stand, Bed to chair/wheelchair/BSC Sit to Stand: Mod assist, Min assist   Step pivot transfers: Mod assist, Min assist       General transfer comment: assist and cueing provided to power up to standing with RW    Ambulation/Gait Ambulation/Gait assistance: Min assist Gait Distance (Feet): 20 Feet Assistive device: Rolling walker (2 wheels) Gait Pattern/deviations: Step-to pattern, Decreased step length - left, Decreased step length - right, Trunk flexed, Narrow base of support, Decreased weight shift to right, Decreased stride length Gait velocity: decreased     General Gait Details: slow, labored cadence in room with RW   Stairs             Wheelchair Mobility    Modified Rankin (Stroke Patients Only)       Balance Overall balance assessment: Needs assistance Sitting-balance support: No upper extremity supported, Feet supported Sitting balance-Leahy Scale: Good Sitting balance - Comments: seated EOB   Standing balance support: Bilateral upper extremity supported, During  functional activity, Reliant on assistive device for balance Standing balance-Leahy Scale: Fair Standing balance comment: poor to fair using  RW                            Cognition Arousal/Alertness: Awake/alert Behavior During Therapy: WFL for tasks assessed/performed Overall Cognitive Status: Within Functional Limits for tasks assessed                                          Exercises General Exercises - Lower Extremity Long Arc Quad: AROM, AAROM, Strengthening, Seated, 10 reps, Both Hip Flexion/Marching: AROM, 10 reps, Both, AAROM, Strengthening, Seated Toe Raises: AROM, 20 reps, Strengthening, Both, Seated Heel Raises: AROM, 20 reps, Strengthening, Seated, Both    General Comments        Pertinent Vitals/Pain Pain Assessment Pain Assessment: Faces Faces Pain Scale: Hurts little more Pain Location: R hip Pain Descriptors / Indicators: Throbbing, Sore Pain Intervention(s): Limited activity within patient's tolerance, Monitored during session, Repositioned, Premedicated before session    Home Living                          Prior Function            PT Goals (current goals can now be found in the care plan section) Acute Rehab PT Goals Patient Stated Goal: return home PT Goal Formulation: With patient Time For Goal Achievement: 10/28/21 Potential to Achieve Goals: Good Progress towards PT goals: Progressing toward goals    Frequency    Min 4X/week      PT Plan Current plan remains appropriate    Co-evaluation              AM-PAC PT "6 Clicks" Mobility   Outcome Measure  Help needed turning from your back to your side while in a flat bed without using bedrails?: A Little Help needed moving from lying on your back to sitting on the side of a flat bed without using bedrails?: A Little Help needed moving to and from a bed to a chair (including a wheelchair)?: A Lot Help needed standing up from a chair using your arms (e.g., wheelchair or bedside chair)?: A Lot Help needed to walk in hospital room?: A Lot Help needed climbing 3-5 steps with a railing?  : A Lot 6 Click Score: 14    End of Session Equipment Utilized During Treatment: Gait belt Activity Tolerance: Patient tolerated treatment well;Patient limited by pain;Patient limited by fatigue Patient left: in chair;with call bell/phone within reach Nurse Communication: Mobility status PT Visit Diagnosis: Unsteadiness on feet (R26.81);Other abnormalities of gait and mobility (R26.89);Muscle weakness (generalized) (M62.81)     Time: 3005-1102 PT Time Calculation (min) (ACUTE ONLY): 19 min  Charges:  $Therapeutic Activity: 8-22 mins                     11:39 AM, 10/16/21 Mearl Latin PT, DPT Physical Therapist at [facility]
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
## 2021-10-16 NOTE — Progress Notes (Signed)
Ng Discharge Note  Admit Date:  10/12/2021 Discharge date: 10/16/2021   Sandra Horne to be D/C'd Home per MD order.  AVS completed. Patient/caregiver able to verbalize understanding.  Discharge Medication: Allergies as of 10/16/2021   No Known Allergies      Medication List     TAKE these medications    amoxicillin-clavulanate 875-125 MG tablet Commonly known as: AUGMENTIN Take 1 tablet by mouth every 12 (twelve) hours for 4 days.   aspirin EC 81 MG tablet Take 1 tablet (81 mg total) by mouth 2 (two) times daily for 28 days. Swallow whole. What changed:  when to take this reasons to take this   dexamethasone 4 MG tablet Commonly known as: DECADRON Take 1 tablet (4 mg total) by mouth daily after breakfast.   docusate sodium 100 MG capsule Commonly known as: COLACE Take 1 capsule (100 mg total) by mouth 2 (two) times daily for 14 days.   oxyCODONE 5 MG immediate release tablet Commonly known as: Oxy IR/ROXICODONE Take 1 tablet (5 mg total) by mouth every 4 (four) hours as needed for moderate pain (pain score 4-6).   polyethylene glycol 17 g packet Commonly known as: MIRALAX / GLYCOLAX Take 17 g by mouth daily. Start taking on: October 17, 2021        Discharge Assessment: Vitals:   10/15/21 2109 10/16/21 0429  BP: 125/82 (!) 141/71  Pulse: 80 78  Resp: 18 18  Temp: 98.5 F (36.9 C) 98.4 F (36.9 C)  SpO2: 98% 98%   Skin clean, dry and intact without evidence of skin break down, no evidence of skin tears noted. IV catheter discontinued intact. Site without signs and symptoms of complications - no redness or edema noted at insertion site, patient denies c/o pain - only slight tenderness at site.  Dressing with slight pressure applied.  D/c Instructions-Education: Discharge instructions given to patient/family with verbalized understanding. D/c education completed with patient/family including follow up instructions, medication list, d/c activities  limitations if indicated, with other d/c instructions as indicated by MD - patient able to verbalize understanding, all questions fully answered. Patient instructed to return to ED, call 911, or call MD for any changes in condition.  Patient escorted via [place], and D/C home via private auto.  Tsosie Billing, LPN [id number]/9476 54:65 PM

## 2021-10-16 NOTE — Telephone Encounter (Signed)
I spoke with the patient and the nurse caring for her today. Her nurse is going to message the social worker working on her facility placement about the upcoming radiation planning and treatment appointments beginning on 7/31. We want to ensure transportation will be provided from the rehab facility for these.   Mont Dutton R.T.(R)(T) Radiation Special Procedures Navigator  [phone]

## 2021-10-17 LAB — BPAM RBC
Blood Product Expiration Date: 202308202359
Blood Product Expiration Date: 202308202359
Unit Type and Rh: 5100
Unit Type and Rh: 5100

## 2021-10-17 LAB — TYPE AND SCREEN
ABO/RH(D): O POS
Antibody Screen: POSITIVE
Donor AG Type: NEGATIVE
Donor AG Type: NEGATIVE
PT AG Type: NEGATIVE
Unit division: 0
Unit division: 0

## 2021-10-19 ENCOUNTER — Other Ambulatory Visit: Payer: Self-pay

## 2021-10-19 ENCOUNTER — Ambulatory Visit: Payer: Medicaid Other | Admitting: Radiation Oncology

## 2021-10-19 LAB — SURGICAL PATHOLOGY

## 2021-10-21 ENCOUNTER — Telehealth: Payer: Self-pay | Admitting: Orthopedic Surgery

## 2021-10-21 ENCOUNTER — Ambulatory Visit: Payer: Medicaid Other | Admitting: Radiation Oncology

## 2021-10-22 ENCOUNTER — Telehealth: Payer: Self-pay | Admitting: General Practice

## 2021-10-22 NOTE — Telephone Encounter (Signed)
   ANDE THERRELL DOB: [birth year] MRN: [id number]   RIDER WAIVER AND RELEASE OF LIABILITY  For purposes of improving physical access to our facilities, [facility] is pleased to partner with third parties to provide [facility] patients or other authorized individuals the option of convenient, on-demand ground transportation services (the [place]") through use of the technology service that enables users to request on-demand ground transportation from independent third-party providers.  By opting to use and accept these [employer], I, the undersigned, hereby agree on behalf of myself, and on behalf of any minor child using the Government social research officer for whom I am the parent or legal guardian, as follows:  Government social research officer provided to me are provided by independent third-party transportation providers who are not [employer] or employees and who are unaffiliated with [employer]. [facility] is neither a transportation carrier nor a common or public carrier. [facility] has no control over the quality or safety of the transportation that occurs as a result of the [employer]. [facility] cannot guarantee that any third-party transportation provider will complete any arranged transportation service. [facility] makes no representation, warranty, or guarantee regarding the reliability, timeliness, quality, safety, suitability, or availability of any of the Transport Services or that they will be error free. I fully understand that traveling by vehicle involves risks and dangers of serious bodily injury, including permanent disability, paralysis, and death. I agree, on behalf of myself and on behalf of any minor child using the Transport Services for whom I am the parent or legal guardian, that the entire risk arising out of my use of the [employer] remains solely with me, to the maximum extent permitted under applicable law. The [employer] are provided "as  is" and "as available." [facility] disclaims all representations and warranties, express, implied or statutory, not expressly set out in these terms, including the implied warranties of merchantability and fitness for a particular purpose. I hereby waive and release [facility], its agents, employees, officers, directors, representatives, insurers, attorneys, assigns, successors, subsidiaries, and affiliates from any and all past, present, or future claims, demands, liabilities, actions, causes of action, or suits of any kind directly or indirectly arising from acceptance and use of the [employer]. I further waive and release [facility] and its affiliates from all present and future liability and responsibility for any injury or death to persons or damages to property caused by or related to the use of the [employer]. I have read this Waiver and Release of Liability, and I understand the terms used in it and their legal significance. This Waiver is freely and voluntarily given with the understanding that my right (as well as the right of any minor child for whom I am the parent or legal guardian using the [employer]) to legal recourse against [facility] in connection with the [employer] is knowingly surrendered in return for use of these services.   I attest that I read the consent document to [name], gave [name] the opportunity to ask questions and answered the questions asked (if any). I affirm that [name] then provided consent for she's participation in this program.     Darrick Meigs Vilsaint

## 2021-10-23 ENCOUNTER — Ambulatory Visit: Payer: Medicaid Other | Admitting: Radiation Oncology

## 2021-10-26 ENCOUNTER — Ambulatory Visit: Payer: Medicaid Other | Admitting: Radiation Oncology

## 2021-10-26 ENCOUNTER — Ambulatory Visit
Admission: RE | Admit: 2021-10-26 | Discharge: 2021-10-26 | Disposition: A | Discharge status: Home / Self Care | Payer: Medicaid Other | Source: Ambulatory Visit | Attending: Radiation Oncology | Admitting: Radiation Oncology

## 2021-10-26 ENCOUNTER — Other Ambulatory Visit: Payer: Self-pay

## 2021-10-26 VITALS — BP 99/62 | HR 78 | Temp 98.7°F | Resp 18 | Ht 64.0 in | Wt 141.0 lb

## 2021-10-26 DIAGNOSIS — C3490 Malignant neoplasm of unspecified part of unspecified bronchus or lung: Secondary | ICD-10-CM

## 2021-10-26 DIAGNOSIS — C7931 Secondary malignant neoplasm of brain: Secondary | ICD-10-CM

## 2021-10-26 DIAGNOSIS — C3432 Malignant neoplasm of lower lobe, left bronchus or lung: Secondary | ICD-10-CM | POA: Diagnosis not present

## 2021-10-26 MED ORDER — SODIUM CHLORIDE 0.9% FLUSH: 10.0000 mL | Freq: Once | INTRAVENOUS | Status: DC

## 2021-10-26 MED ORDER — SODIUM CHLORIDE 0.9% FLUSH
10.0000 mL | INTRAVENOUS | Status: DC | PRN
  Administered 2021-10-26: 10 mL via INTRAVENOUS

## 2021-10-26 NOTE — Progress Notes (Signed)
Has armband been applied?  Yes.    Does patient have an allergy to IV contrast dye?: No.   Has patient ever received premedication for IV contrast dye?: No.   Does patient take metformin?: No.  If patient does take metformin when was the last dose: None  Date of lab work: October 15, 2021 BUN: 9 CR: 0.60  IV site: antecubital left, condition patent and no redness  Has IV site been added to flowsheet?  Yes.    BP 99/62 (BP Location: Right Arm, Patient Position: Sitting, Cuff Size: Normal)   Pulse 78   Temp 98.7 F (37.1 C) (Temporal)   Resp 18   Ht 5\' 4"  (1.626 m)   Wt 141 lb (64 kg)   BMI 24.20 kg/m

## 2021-10-26 NOTE — Progress Notes (Signed)
  Radiation Oncology         [phone] ________________________________  Name: Sandra Horne MRN: [id number]  Date: 10/26/2021  DOB: February 28, 1956  SIMULATION AND TREATMENT PLANNING NOTE    ICD-10-CM   1. 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  Identity was confirmed.  All relevant records and images related to the planned course of therapy were reviewed.  The patient freely provided informed written consent to proceed with treatment after reviewing the details related to the planned course of therapy. The consent form was witnessed and verified by the simulation staff. Intravenous access was established for contrast administration. Then, the patient was set-up in a stable reproducible supine position for radiation therapy.  A relocatable thermoplastic stereotactic head frame was fabricated for precise immobilization.  CT images were obtained.  Surface markings were placed.  The CT images were loaded into the planning software and fused with the patient's targeting MRI scan.  Then the target and avoidance structures were contoured.  Treatment planning then occurred.  The radiation prescription was entered and confirmed.  I have requested 3D planning  I have requested a DVH of the following structures: Brain stem, brain, left eye, right eye, lenses, optic chiasm, target volumes, uninvolved brain, and normal tissue.    SPECIAL TREATMENT PROCEDURE:  The planned course of therapy using radiation constitutes a special treatment procedure. Special care is required in the management of this patient for the following reasons. This treatment constitutes  a Special Treatment Procedure for the following reason: High dose per fraction requiring special monitoring for increased toxicities of treatment including daily imaging.  The special nature of the planned course of radiotherapy will require increased physician supervision and oversight to ensure patient's safety with optimal treatment outcomes.  This requires extended time and effort.  PLAN:  The patient will receive 27 Gy in 3 fractions to the largest lesion and 20 Gy in 1 fraction to the smaller four mets.  ________________________________  Sheral Apley Tammi Klippel, M.D.

## 2021-10-27 ENCOUNTER — Encounter: Payer: Self-pay | Admitting: Internal Medicine

## 2021-10-27 NOTE — Telephone Encounter (Signed)
Patient aware of appointment with Dr Aline Brochure on 10/28/21.

## 2021-10-28 ENCOUNTER — Ambulatory Visit (INDEPENDENT_AMBULATORY_CARE_PROVIDER_SITE_OTHER): Payer: Medicare Other | Admitting: Orthopedic Surgery

## 2021-10-28 ENCOUNTER — Encounter: Payer: Self-pay | Admitting: Orthopedic Surgery

## 2021-10-28 DIAGNOSIS — S72001A Fracture of unspecified part of neck of right femur, initial encounter for closed fracture: Secondary | ICD-10-CM

## 2021-10-28 MED ORDER — METHOCARBAMOL 500 MG PO TABS: 500.0000 mg | ORAL_TABLET | Freq: Three times a day (TID) | ORAL | 1 refills | Status: DC

## 2021-10-28 MED ORDER — HYDROCODONE-ACETAMINOPHEN 7.5-325 MG PO TABS: 1.0000 | ORAL_TABLET | Freq: Four times a day (QID) | ORAL | 0 refills | Status: DC | PRN

## 2021-10-28 NOTE — Progress Notes (Signed)
FOLLOW UP   Encounter Diagnosis  Name Primary?   Closed displaced fracture of right femoral neck ([place]) 10/13/21 Bipolar hip replacement  Yes     Chief Complaint  Patient presents with   Post-op Follow-up    10/13/21 bipolar hip replaced right/ due to fracture/ needs refill pain meds      This is the first postop visit we are now postop day 15 status post bipolar hip replacement for right femoral neck fracture  Admit date 10/12/2021 Discharge date 10/16/2021  66 year old female with nonsmall cell lung cancer tripped over a cord at home and fractured her right hip.  Hip fracture was evaluated with pathology report and there was no cancer there  Wound looks good staples were extracted leg lengths are equal limb alignment is normal thigh motion no significant pain mild discomfort  Recommend physical therapy weight-bear as tolerated with walker  The patient did complain of some muscle spasms we will put her on a muscle relaxer  Follow-up in 4 weeks  Start outpatient PT  Meds ordered this encounter  Medications   HYDROcodone-acetaminophen (NORCO) 7.5-325 MG tablet    Sig: Take 1 tablet by mouth every 6 (six) hours as needed for up to 5 days for moderate pain.    Dispense:  20 tablet    Refill:  0   methocarbamol (ROBAXIN) 500 MG tablet    Sig: Take 1 tablet (500 mg total) by mouth 3 (three) times daily.    Dispense:  60 tablet    Refill:  1

## 2021-10-28 NOTE — Patient Instructions (Signed)
(  [phone] is the phone number to call and schedule the physical therapy

## 2021-10-29 ENCOUNTER — Other Ambulatory Visit: Payer: Self-pay

## 2021-10-29 ENCOUNTER — Ambulatory Visit
Admission: RE | Admit: 2021-10-29 | Discharge: 2021-10-29 | Disposition: A | Discharge status: Home / Self Care | Payer: Medicare Other | Source: Ambulatory Visit | Attending: Radiation Oncology | Admitting: Radiation Oncology

## 2021-10-29 VITALS — BP 97/65 | HR 88 | Temp 97.4°F | Resp 20

## 2021-10-29 DIAGNOSIS — R531 Weakness: Secondary | ICD-10-CM | POA: Diagnosis not present

## 2021-10-29 DIAGNOSIS — C3490 Malignant neoplasm of unspecified part of unspecified bronchus or lung: Secondary | ICD-10-CM | POA: Insufficient documentation

## 2021-10-29 DIAGNOSIS — C7931 Secondary malignant neoplasm of brain: Secondary | ICD-10-CM | POA: Diagnosis not present

## 2021-10-29 DIAGNOSIS — G936 Cerebral edema: Secondary | ICD-10-CM | POA: Diagnosis not present

## 2021-10-29 LAB — RAD ONC ARIA SESSION SUMMARY
Course Elapsed Days: 0
Plan Fractions Treated to Date: 1
Plan Prescribed Dose Per Fraction: 9 Gy
Plan Total Fractions Prescribed: 1
Plan Total Prescribed Dose: 9 Gy
Reference Point Dosage Given to Date: 9 Gy
Reference Point Session Dosage Given: 9 Gy
Session Number: 1

## 2021-10-29 NOTE — Progress Notes (Signed)
Patient in to clinic for 15 minute observation [facility].  Denies headache, nausea, dizziness, visual changes, and ringing in ears. Reports feeling mildly fatigued.  Taking Decadron 4 mg daily.  Patient left clinic via wheelchair had recently fractured hip.  Patient reminded not to do anything strenuous for the next 24 hours and to call [phone] for nurse. Vitals:  97.4-88-20-97/65 O2 sat 100%.

## 2021-10-31 ENCOUNTER — Inpatient Hospital Stay (HOSPITAL_COMMUNITY)
Admission: EM | Admit: 2021-10-31 | Discharge: 2021-11-03 | DRG: 081 | Disposition: A | Discharge status: Home Health | Payer: Medicare Other | Attending: Internal Medicine | Admitting: Internal Medicine

## 2021-10-31 ENCOUNTER — Other Ambulatory Visit: Payer: Self-pay

## 2021-10-31 ENCOUNTER — Encounter (HOSPITAL_COMMUNITY): Payer: Self-pay

## 2021-10-31 ENCOUNTER — Inpatient Hospital Stay (HOSPITAL_COMMUNITY): Payer: Medicare Other

## 2021-10-31 ENCOUNTER — Emergency Department (HOSPITAL_COMMUNITY): Payer: Medicare Other

## 2021-10-31 DIAGNOSIS — Z809 Family history of malignant neoplasm, unspecified: Secondary | ICD-10-CM

## 2021-10-31 DIAGNOSIS — Z789 Other specified health status: Secondary | ICD-10-CM | POA: Diagnosis not present

## 2021-10-31 DIAGNOSIS — Z6824 Body mass index (BMI) 24.0-24.9, adult: Secondary | ICD-10-CM

## 2021-10-31 DIAGNOSIS — R29810 Facial weakness: Secondary | ICD-10-CM | POA: Diagnosis present

## 2021-10-31 DIAGNOSIS — Z7982 Long term (current) use of aspirin: Secondary | ICD-10-CM

## 2021-10-31 DIAGNOSIS — S72011D Unspecified intracapsular fracture of right femur, subsequent encounter for closed fracture with routine healing: Secondary | ICD-10-CM | POA: Diagnosis not present

## 2021-10-31 DIAGNOSIS — C7931 Secondary malignant neoplasm of brain: Secondary | ICD-10-CM | POA: Diagnosis present

## 2021-10-31 DIAGNOSIS — R531 Weakness: Secondary | ICD-10-CM | POA: Diagnosis not present

## 2021-10-31 DIAGNOSIS — R54 Age-related physical debility: Secondary | ICD-10-CM | POA: Diagnosis present

## 2021-10-31 DIAGNOSIS — Z823 Family history of stroke: Secondary | ICD-10-CM | POA: Diagnosis not present

## 2021-10-31 DIAGNOSIS — R739 Hyperglycemia, unspecified: Secondary | ICD-10-CM | POA: Diagnosis present

## 2021-10-31 DIAGNOSIS — G8191 Hemiplegia, unspecified affecting right dominant side: Secondary | ICD-10-CM | POA: Diagnosis present

## 2021-10-31 DIAGNOSIS — E440 Moderate protein-calorie malnutrition: Secondary | ICD-10-CM | POA: Diagnosis present

## 2021-10-31 DIAGNOSIS — C3432 Malignant neoplasm of lower lobe, left bronchus or lung: Secondary | ICD-10-CM | POA: Diagnosis present

## 2021-10-31 DIAGNOSIS — Z720 Tobacco use: Secondary | ICD-10-CM | POA: Diagnosis not present

## 2021-10-31 DIAGNOSIS — G936 Cerebral edema: Secondary | ICD-10-CM | POA: Diagnosis present

## 2021-10-31 DIAGNOSIS — Z79899 Other long term (current) drug therapy: Secondary | ICD-10-CM | POA: Diagnosis not present

## 2021-10-31 DIAGNOSIS — F17210 Nicotine dependence, cigarettes, uncomplicated: Secondary | ICD-10-CM | POA: Diagnosis present

## 2021-10-31 DIAGNOSIS — C3490 Malignant neoplasm of unspecified part of unspecified bronchus or lung: Secondary | ICD-10-CM | POA: Diagnosis not present

## 2021-10-31 DIAGNOSIS — D630 Anemia in neoplastic disease: Secondary | ICD-10-CM | POA: Diagnosis present

## 2021-10-31 DIAGNOSIS — T380X5A Adverse effect of glucocorticoids and synthetic analogues, initial encounter: Secondary | ICD-10-CM | POA: Diagnosis present

## 2021-10-31 DIAGNOSIS — Z8249 Family history of ischemic heart disease and other diseases of the circulatory system: Secondary | ICD-10-CM

## 2021-10-31 DIAGNOSIS — Z515 Encounter for palliative care: Secondary | ICD-10-CM | POA: Diagnosis not present

## 2021-10-31 DIAGNOSIS — S72001A Fracture of unspecified part of neck of right femur, initial encounter for closed fracture: Secondary | ICD-10-CM | POA: Diagnosis not present

## 2021-10-31 LAB — CBC
HCT: 31.5 % — ABNORMAL LOW (ref 36.0–46.0)
Hemoglobin: 9.7 g/dL — ABNORMAL LOW (ref 12.0–15.0)
MCH: 26.6 pg (ref 26.0–34.0)
MCHC: 30.8 g/dL (ref 30.0–36.0)
MCV: 86.5 fL (ref 80.0–100.0)
Platelets: 671 10*3/uL — ABNORMAL HIGH (ref 150–400)
RBC: 3.64 MIL/uL — ABNORMAL LOW (ref 3.87–5.11)
RDW: 17.4 % — ABNORMAL HIGH (ref 11.5–15.5)
WBC: 7.1 10*3/uL (ref 4.0–10.5)
nRBC: 0 % (ref 0.0–0.2)

## 2021-10-31 LAB — DIFFERENTIAL
Abs Immature Granulocytes: 0.07 10*3/uL (ref 0.00–0.07)
Basophils Absolute: 0.1 10*3/uL (ref 0.0–0.1)
Basophils Relative: 1 %
Eosinophils Absolute: 0.2 10*3/uL (ref 0.0–0.5)
Eosinophils Relative: 3 %
Immature Granulocytes: 1 %
Lymphocytes Relative: 24 %
Lymphs Abs: 1.7 10*3/uL (ref 0.7–4.0)
Monocytes Absolute: 0.8 10*3/uL (ref 0.1–1.0)
Monocytes Relative: 11 %
Neutro Abs: 4.3 10*3/uL (ref 1.7–7.7)
Neutrophils Relative %: 60 %

## 2021-10-31 LAB — PROTIME-INR
INR: 1 (ref 0.8–1.2)
Prothrombin Time: 12.8 seconds (ref 11.4–15.2)

## 2021-10-31 LAB — COMPREHENSIVE METABOLIC PANEL
ALT: 23 U/L (ref 0–44)
AST: 18 U/L (ref 15–41)
Albumin: 3.1 g/dL — ABNORMAL LOW (ref 3.5–5.0)
Alkaline Phosphatase: 99 U/L (ref 38–126)
Anion gap: 7 (ref 5–15)
BUN: 7 mg/dL — ABNORMAL LOW (ref 8–23)
CO2: 27 mmol/L (ref 22–32)
Calcium: 8.8 mg/dL — ABNORMAL LOW (ref 8.9–10.3)
Chloride: 106 mmol/L (ref 98–111)
Creatinine, Ser: 0.65 mg/dL (ref 0.44–1.00)
GFR, Estimated: >60 mL/min (ref >60)
Glucose, Bld: 135 mg/dL — ABNORMAL HIGH (ref 70–99)
Potassium: 3.7 mmol/L (ref 3.5–5.1)
Sodium: 140 mmol/L (ref 135–145)
Total Bilirubin: 0.8 mg/dL (ref 0.3–1.2)
Total Protein: 7.1 g/dL (ref 6.5–8.1)

## 2021-10-31 LAB — GLUCOSE, CAPILLARY: Glucose-Capillary: 222 mg/dL — ABNORMAL HIGH (ref 70–99)

## 2021-10-31 LAB — I-STAT CHEM 8, ED
BUN: 4 mg/dL — ABNORMAL LOW (ref 8–23)
Calcium, Ion: 1.19 mmol/L (ref 1.15–1.40)
Chloride: 102 mmol/L (ref 98–111)
Creatinine, Ser: 0.6 mg/dL (ref 0.44–1.00)
Glucose, Bld: 129 mg/dL — ABNORMAL HIGH (ref 70–99)
HCT: 30 % — ABNORMAL LOW (ref 36.0–46.0)
Hemoglobin: 10.2 g/dL — ABNORMAL LOW (ref 12.0–15.0)
Potassium: 3.6 mmol/L (ref 3.5–5.1)
Sodium: 140 mmol/L (ref 135–145)
TCO2: 25 mmol/L (ref 22–32)

## 2021-10-31 LAB — ETHANOL: Alcohol, Ethyl (B): <10 mg/dL (ref <10)

## 2021-10-31 LAB — CBG MONITORING, ED
Glucose-Capillary: 116 mg/dL — ABNORMAL HIGH (ref 70–99)
Glucose-Capillary: 137 mg/dL — ABNORMAL HIGH (ref 70–99)

## 2021-10-31 LAB — APTT: aPTT: 33 seconds (ref 24–36)

## 2021-10-31 MED ORDER — METHOCARBAMOL 500 MG PO TABS
500.0000 mg | ORAL_TABLET | Freq: Three times a day (TID) | ORAL | Status: DC
  Administered 2021-10-31 – 2021-11-03 (×8): 500 mg via ORAL
  Filled 2021-10-31 (×8): qty 1

## 2021-10-31 MED ORDER — SODIUM CHLORIDE 0.9% FLUSH
3.0000 mL | Freq: Two times a day (BID) | INTRAVENOUS | Status: DC
  Administered 2021-10-31 – 2021-11-02 (×3): 3 mL via INTRAVENOUS

## 2021-10-31 MED ORDER — LORAZEPAM 2 MG/ML IJ SOLN
1.0000 mg | Freq: Four times a day (QID) | INTRAMUSCULAR | Status: DC | PRN
  Administered 2021-10-31: 1 mg via INTRAVENOUS
  Filled 2021-10-31: qty 1

## 2021-10-31 MED ORDER — ACETAMINOPHEN 325 MG PO TABS
650.0000 mg | ORAL_TABLET | Freq: Four times a day (QID) | ORAL | Status: DC | PRN
  Administered 2021-11-01 – 2021-11-02 (×3): 650 mg via ORAL
  Filled 2021-10-31 (×3): qty 2

## 2021-10-31 MED ORDER — SODIUM CHLORIDE 0.9 % IV SOLN: INTRAVENOUS | Status: DC | PRN

## 2021-10-31 MED ORDER — INSULIN ASPART 100 UNIT/ML IJ SOLN
0.0000 [IU] | Freq: Three times a day (TID) | INTRAMUSCULAR | Status: DC
  Administered 2021-11-01: 2 [IU] via SUBCUTANEOUS
  Administered 2021-11-01: 1 [IU] via SUBCUTANEOUS
  Administered 2021-11-01: 2 [IU] via SUBCUTANEOUS
  Administered 2021-11-02: 1 [IU] via SUBCUTANEOUS

## 2021-10-31 MED ORDER — DEXAMETHASONE SODIUM PHOSPHATE 4 MG/ML IJ SOLN
4.0000 mg | Freq: Four times a day (QID) | INTRAMUSCULAR | Status: DC
  Administered 2021-10-31 – 2021-11-03 (×11): 4 mg via INTRAVENOUS
  Filled 2021-10-31 (×11): qty 1

## 2021-10-31 MED ORDER — LABETALOL HCL 5 MG/ML IV SOLN: 10.0000 mg | INTRAVENOUS | Status: DC | PRN

## 2021-10-31 MED ORDER — ACETAMINOPHEN 650 MG RE SUPP: 650.0000 mg | Freq: Four times a day (QID) | RECTAL | Status: DC | PRN

## 2021-10-31 MED ORDER — DEXAMETHASONE SODIUM PHOSPHATE 10 MG/ML IJ SOLN
10.0000 mg | Freq: Once | INTRAMUSCULAR | Status: AC
  Administered 2021-10-31: 10 mg via INTRAVENOUS
  Filled 2021-10-31: qty 1

## 2021-10-31 MED ORDER — BISACODYL 10 MG RE SUPP: 10.0000 mg | Freq: Every day | RECTAL | Status: DC | PRN

## 2021-10-31 MED ORDER — OXYCODONE HCL 5 MG PO TABS
5.0000 mg | ORAL_TABLET | ORAL | Status: DC | PRN
  Administered 2021-11-01 – 2021-11-02 (×4): 5 mg via ORAL
  Filled 2021-10-31 (×4): qty 1

## 2021-10-31 MED ORDER — ONDANSETRON HCL 4 MG/2ML IJ SOLN: 4.0000 mg | Freq: Four times a day (QID) | INTRAMUSCULAR | Status: DC | PRN

## 2021-10-31 MED ORDER — SODIUM CHLORIDE 0.9% FLUSH: 3.0000 mL | Freq: Once | INTRAVENOUS | Status: DC

## 2021-10-31 MED ORDER — ENSURE ENLIVE PO LIQD
237.0000 mL | Freq: Two times a day (BID) | ORAL | Status: DC
  Administered 2021-11-01 – 2021-11-02 (×3): 237 mL via ORAL

## 2021-10-31 MED ORDER — POLYETHYLENE GLYCOL 3350 17 G PO PACK: 17.0000 g | PACK | Freq: Every day | ORAL | Status: DC | PRN

## 2021-10-31 MED ORDER — SODIUM CHLORIDE 0.9% FLUSH: 3.0000 mL | INTRAVENOUS | Status: DC | PRN

## 2021-10-31 MED ORDER — SODIUM CHLORIDE 0.9% FLUSH
3.0000 mL | Freq: Two times a day (BID) | INTRAVENOUS | Status: DC
  Administered 2021-10-31 – 2021-11-03 (×6): 3 mL via INTRAVENOUS

## 2021-10-31 MED ORDER — ASPIRIN 81 MG PO TBEC
81.0000 mg | DELAYED_RELEASE_TABLET | Freq: Two times a day (BID) | ORAL | Status: DC
  Administered 2021-10-31 – 2021-11-03 (×6): 81 mg via ORAL
  Filled 2021-10-31 (×6): qty 1

## 2021-10-31 MED ORDER — POLYETHYLENE GLYCOL 3350 17 G PO PACK
17.0000 g | PACK | Freq: Every day | ORAL | Status: DC
Start: 2021-10-31 — End: 2021-11-03
  Administered 2021-10-31 – 2021-11-03 (×4): 17 g via ORAL
  Filled 2021-10-31 (×4): qty 1

## 2021-10-31 MED ORDER — ONDANSETRON HCL 4 MG PO TABS: 4.0000 mg | ORAL_TABLET | Freq: Four times a day (QID) | ORAL | Status: DC | PRN

## 2021-10-31 MED ORDER — INSULIN ASPART 100 UNIT/ML IJ SOLN
0.0000 [IU] | Freq: Every day | INTRAMUSCULAR | Status: DC
  Administered 2021-10-31: 2 [IU] via SUBCUTANEOUS

## 2021-10-31 MED ORDER — TRAZODONE HCL 50 MG PO TABS: 50.0000 mg | ORAL_TABLET | Freq: Every evening | ORAL | Status: DC | PRN

## 2021-10-31 MED ORDER — LEVETIRACETAM IN NACL 500 MG/100ML IV SOLN
500.0000 mg | Freq: Two times a day (BID) | INTRAVENOUS | Status: DC
  Administered 2021-11-01: 500 mg via INTRAVENOUS
  Filled 2021-10-31: qty 100

## 2021-10-31 MED ORDER — NICOTINE 21 MG/24HR TD PT24
21.0000 mg | MEDICATED_PATCH | Freq: Every day | TRANSDERMAL | Status: DC
  Administered 2021-10-31 – 2021-11-03 (×4): 21 mg via TRANSDERMAL
  Filled 2021-10-31 (×4): qty 1

## 2021-10-31 MED ORDER — LEVETIRACETAM IN NACL 1500 MG/100ML IV SOLN
1500.0000 mg | Freq: Once | INTRAVENOUS | Status: AC
Start: 2021-10-31 — End: 2021-11-01
  Administered 2021-10-31: 1500 mg via INTRAVENOUS
  Filled 2021-10-31: qty 100

## 2021-10-31 MED ORDER — HEPARIN SODIUM (PORCINE) 5000 UNIT/ML IJ SOLN
5000.0000 [IU] | Freq: Three times a day (TID) | INTRAMUSCULAR | Status: DC
Start: 2021-10-31 — End: 2021-11-03
  Administered 2021-10-31 – 2021-11-03 (×9): 5000 [IU] via SUBCUTANEOUS
  Filled 2021-10-31 (×9): qty 1

## 2021-10-31 NOTE — ED Provider Triage Note (Signed)
Emergency Medicine Provider Triage Evaluation Note  Sandra Horne , a 66 y.o. female  was evaluated in triage.  Pt complains of right-sided arm weakness that started around 10 AM when she woke up this morning.  Patient went to bed normally yesterday.  Patient currently being treated with radiation for metastatic brain cancer from lung.  Patient states her right arm is normally not like this.  Denies trouble walking or speaking.  Review of Systems  Positive:  Negative: See above   Physical Exam  BP 108/74 (BP Location: Right Arm)   Pulse (!) 105   Temp 98.2 F (36.8 C) (Oral)   Resp 20   Ht 5\' 4"  (1.626 m)   Wt 64 kg   SpO2 98%   BMI 24.20 kg/m  Gen:   Awake, no distress   Resp:  Normal effort  MSK:   Moves extremities without difficulty  Other:  Pronator drift present on the right.  Grip strength is decreased on the right slightly but weak throughout.  Patient has right-sided facial droop but this improves when she smiles.  Lower extremities are equal.  Medical Decision Making  Medically screening exam initiated at 2:42 PM.  Appropriate orders placed.  Sandra Horne was informed that the remainder of the evaluation will be completed by another provider, this initial triage assessment does not replace that evaluation, and the importance of remaining in the ED until their evaluation is complete.  Patient outside window for tPA and is Sandra Lei negative.  Will order stroke labs and imaging but will not activate code stroke at this time.   Sandra Horne, [place] 10/31/21 [phone]

## 2021-10-31 NOTE — H&P (Signed)
Patient Demographics:    Sandra Horne, is a 66 y.o. female  MRN: [id number]   DOB - 16-Aug-1955  Admit Date - 10/31/2021  Outpatient Primary MD for the patient is Patient, No Pcp Per   Assessment & Plan:   Assessment and Plan:  1)Left Hilar Mass highly suspicious for non-small cell Lung cancer w/ Brain Metastasis:  Primary Lung Ca with Brain mets---last radiation treatment was 10/29/21----steroids were abruptly stopped---now CT head in the ED shows left parietal lobe mass appears mildly increased in size compared to no a recent prior brain MRI with Associated with vasogenic edema. -  Radiation oncology Dr Tammi Klippel recommends iv Decadron  4 mg Decadron every 6 hrs-and transfer to [facility] -Please get Brain MRI  (Not available at [facility] over the weekend) and please notify Neurology and Radiation oncology when pt arrives at [facility] to get official consult  2)New right-sided weakness--- secondary to #1 above -Management as above #1 -Please get physical therapy and Occupational Therapy eval 24 to 48 hours from now after improvement with steroid therapy  3)Hyperglycemia--- anticipate worsening glycemic control with high-dose steroids Use Novolog/Humalog Sliding scale insulin with Accu-Cheks/Fingersticks as ordered   4)Chronic Anemia--in the setting of underlying malignancy and ongoing radiation treatments, Hgb currently 9.7 which is close to prior baseline -No bleeding concerns at this time  5)Tobacco Abuse--- not ready to quit smoking okay to use nicotine patch  Disposition/Need for in-Hospital Stay- patient unable to be discharged at this time due to -- -Transfer to [facility] for brain MRI, continue IV Decadron pending neurology and radiation oncology evaluation  Status is: Inpatient  Remains inpatient  appropriate because:   Dispo: The patient is from: Home              Anticipated d/c is to: Home              Anticipated d/c date is: 2 days              Patient currently is not medically stable to d/c. Barriers: Not Clinically Stable-    With History of - Reviewed by me  Past Medical History:  Diagnosis Date   Cancer ([place])    Lung and Brain mets      Past Surgical History:  Procedure Laterality Date   BRONCHIAL BRUSHINGS  03/26/2021   Procedure: BRONCHIAL BRUSHINGS;  Surgeon: Margaretha Seeds, MD;  Location: [facility] ENDOSCOPY;  Service: Cardiopulmonary;;   BRONCHIAL NEEDLE ASPIRATION BIOPSY  03/26/2021   Procedure: BRONCHIAL NEEDLE ASPIRATION BIOPSIES;  Surgeon: Margaretha Seeds, MD;  Location: [facility];  Service: Cardiopulmonary;;   BRONCHIAL WASHINGS  03/26/2021   Procedure: BRONCHIAL WASHINGS;  Surgeon: Margaretha Seeds, MD;  Location: [facility];  Service: Cardiopulmonary;;   ECTOPIC PREGNANCY SURGERY     ENDOBRONCHIAL ULTRASOUND Bilateral 03/26/2021   Procedure: ENDOBRONCHIAL ULTRASOUND;  Surgeon: Margaretha Seeds, MD;  Location: [facility] ENDOSCOPY;  Service: Cardiopulmonary;  Laterality: Bilateral;  HEMOSTASIS CONTROL  03/26/2021   Procedure: HEMOSTASIS CONTROL;  Surgeon: Margaretha Seeds, MD;  Location: [facility];  Service: Cardiopulmonary;;   HIP ARTHROPLASTY Right 10/13/2021   Procedure: ARTHROPLASTY BIPOLAR HIP (HEMIARTHROPLASTY);  Surgeon: Carole Civil, MD;  Location: [facility];  Service: Orthopedics;  Laterality: Right;    Chief Complaint  Patient presents with   Weakness      HPI:    Sandra Horne  is a 66 y.o. female smoker who was initially diagnosed with left infrahilar lung mass on September 2021 but lost follow-up, subsequently presented to the ED at [facility] back in December 2022  with complaints of headache, weakness, confusion -Subsequently underwent endobronchial biopsy 03/26/21 with pathology and imaging studies suggesting left hilar mass  non-small cell carcinoma with brain mets -Has been following up lately with medical oncologist as well as radiation oncologist -Patient presents to the ER today on 10/31/2021 with complaints of worsening right-sided weakness of greater than 24 hours duration-- -last radiation treatment was 10/29/21----steroids were abruptly stopped---now has Increasing Rt sided weakness and CT Head now shows worsening vasogenic edema.  - Radiation oncology Dr Tammi Klippel recommends iv Decadron  4 mg Decadron every 6-and transfer to [facility] for brain MRI, neurology, possible neurosurgical and radiation oncology input -Please get Brain MRI  (Not available at [facility] over the weekend) and please notify Neurology and Radiation oncology when pt arrives at [facility] to get official consult No fever  Or chills  -Patient has nausea but no vomiting, No chest pains no palpitations -No productive cough -In the ED CT head shows--Left parietal lobe mass appears mildly increased in size compared to no a recent prior brain MRI... Associated with vasogenic edema -Potassium is 3.7 sodium is 140 creatinine 0.65, LFTs are not elevated -CBC with a white count of 7.1 Hgb is 9.7 which is close to prior baseline, platelets 671    Review of systems:    In addition to the HPI above,   A full Review of  Systems was done, all other systems reviewed are negative except as noted above in HPI , .    Social History:  Reviewed by me    Social History   Tobacco Use   Smoking status: Some Days    Packs/day: 0.25    Years: 40.00    Total pack years: 10.00    Types: Cigarettes   Smokeless tobacco: Never  Substance Use Topics   Alcohol use: Not Currently    Comment: drinks beer rarely        Family History :  Reviewed by me    Family History  Problem Relation Age of Onset   Hypertension Mother    Stroke Mother    Hypertension Father    Cancer Sister    Cancer Paternal Uncle     Home Medications:   Prior to Admission  medications   Medication Sig Start Date End Date Taking? Authorizing Provider  aspirin EC 81 MG tablet Take 1 tablet (81 mg total) by mouth 2 (two) times daily for 28 days. Swallow whole. 10/16/21 11/13/21  Shelly Coss, MD  dexamethasone (DECADRON) 4 MG tablet Take 1 tablet (4 mg total) by mouth daily after breakfast. Patient not taking: Reported on 10/28/2021 10/01/21   Bruning, [name], PA-C  HYDROcodone-acetaminophen (NORCO) 7.5-325 MG tablet Take 1 tablet by mouth every 6 (six) hours as needed for up to 5 days for moderate pain. 10/28/21 11/02/21  Carole Civil, MD  methocarbamol (ROBAXIN) 500 MG  tablet Take 1 tablet (500 mg total) by mouth 3 (three) times daily. 10/28/21   Carole Civil, MD  oxyCODONE (OXY IR/ROXICODONE) 5 MG immediate release tablet Take 1 tablet (5 mg total) by mouth every 4 (four) hours as needed for moderate pain (pain score 4-6). Patient not taking: Reported on 10/28/2021 10/15/21   Shelly Coss, MD  polyethylene glycol (MIRALAX / GLYCOLAX) 17 g packet Take 17 g by mouth daily. 10/17/21   Shelly Coss, MD     Allergies:    No Known Allergies   Physical Exam:   Vitals  Blood pressure 119/66, pulse 90, temperature 98.4 F (36.9 C), temperature source Oral, resp. rate 20, height 5\' 4"  (1.626 m), weight 64 kg, SpO2 100 %.  Physical Examination: General appearance - alert,  in no distress  Mental status - alert, oriented to person, place, and time,  Eyes - sclera anicteric Neck - supple, no JVD elevation , Chest - clear  to auscultation bilaterally, symmetrical air movement,  Heart - S1 and S2 normal, regular  Abdomen - soft, nontender, nondistended, +BS Neurological -right-sided weakness/hemiparesis, mild facial asymmetry, no tremors Extremities - no pedal edema noted, intact peripheral pulses  Skin - warm, dry     Data Review:    CBC Recent Labs  Lab 10/31/21 1510 10/31/21 1528  WBC 7.1  --   HGB 9.7* 10.2*  HCT 31.5* 30.0*  PLT 671*  --    MCV 86.5  --   MCH 26.6  --   MCHC 30.8  --   RDW 17.4*  --   LYMPHSABS 1.7  --   MONOABS 0.8  --   EOSABS 0.2  --   BASOSABS 0.1  --    ------------------------------------------------------------------------------------------------------------------  Chemistries  Recent Labs  Lab 10/31/21 1510 10/31/21 1528  NA 140 140  K 3.7 3.6  CL 106 102  CO2 27  --   GLUCOSE 135* 129*  BUN 7* 4*  CREATININE 0.65 0.60  CALCIUM 8.8*  --   AST 18  --   ALT 23  --   ALKPHOS 99  --   BILITOT 0.8  --    ------------------------------------------------------------------------------------------------------------------ estimated creatinine clearance is 61.3 mL/min (by C-G formula based on SCr of 0.6 mg/dL). ------------------------------------------------------------------------------------------------------------------ No results for input(s): "TSH", "T4TOTAL", "T3FREE", "THYROIDAB" in the last 72 hours.  Invalid input(s): "FREET3"   Coagulation profile Recent Labs  Lab 10/31/21 1510  INR 1.0   ------------------------------------------------------------------------------------------------------------------- No results for input(s): "DDIMER" in the last 72 hours. -------------------------------------------------------------------------------------------------------------------  Cardiac Enzymes No results for input(s): "CKMB", "TROPONINI", "MYOGLOBIN" in the last 168 hours.  Invalid input(s): "CK" ------------------------------------------------------------------------------------------------------------------ No results found for: "BNP"   ---------------------------------------------------------------------------------------------------------------  Urinalysis    Component Value Date/Time   COLORURINE YELLOW 10/14/2021 0829   APPEARANCEUR CLEAR 10/14/2021 0829   LABSPEC 1.009 10/14/2021 0829   PHURINE 6.0 10/14/2021 0829   GLUCOSEU NEGATIVE 10/14/2021 0829   HGBUR  NEGATIVE 10/14/2021 0829   BILIRUBINUR NEGATIVE 10/14/2021 0829   KETONESUR NEGATIVE 10/14/2021 0829   PROTEINUR NEGATIVE 10/14/2021 0829   NITRITE NEGATIVE 10/14/2021 0829   LEUKOCYTESUR NEGATIVE 10/14/2021 0829    ----------------------------------------------------------------------------------------------------------------   Imaging Results:    CT HEAD WO CONTRAST  Result Date: 10/31/2021 CLINICAL DATA:  Pt is currently receiving chemo for brain cancer. Pt states she went to bed normal last night. Pt woke up at 10AM stating her face was "twisting". Weakness noted to right side. EXAM: CT HEAD WITHOUT CONTRAST TECHNIQUE: Contiguous axial images were  obtained from the base of the skull through the vertex without intravenous contrast. RADIATION DOSE REDUCTION: This exam was performed according to the departmental dose-optimization program which includes automated exposure control, adjustment of the mA and/or kV according to patient size and/or use of iterative reconstruction technique. COMPARISON:  Head CT, 03/17/2021.  Brain MRI, 10/11/2021. FINDINGS: Brain: Low-attenuation oval mass, posterior left frontal lobe, 3.3 cm. This appears increased in size compared to the recent brain MRI. There is significant surrounding hypoattenuation consistent with vasogenic edema. Small areas of hypoattenuation are noted in the anterior right frontal lobe, right temporal lobe and left occipital lobe consistent with vasogenic edema from additional lesions, similar to that seen on the prior brain MRI. There is mass effect with sulcal effacement, partial effacement of the left lateral ventricle and midline shift to the left of 9 mm. Mass effect appears similar to the prior brain MRI. No convincing infarct. No intracranial hemorrhage. No extra-axial masses or abnormal fluid collections. Vascular: No hyperdense vessel or unexpected calcification. Skull: Normal. Negative for fracture or focal lesion. Sinuses/Orbits:  Visualized globes and orbits are unremarkable. Visualized sinuses are clear. Other: None. IMPRESSION: 1. Left parietal lobe mass appears mildly increased in size compared to no a recent prior brain MRI. There is a similar amount associated vasogenic edema. Vasogenic edema associated with the smaller brain masses is also similar to the prior brain MRI. Mass effect is without significant change. Findings are consistent with metastatic disease, better defined on the recent prior brain MRI. 2. No new abnormalities. No evidence of an ischemic infarct. No intracranial hemorrhage. Electronically Signed   By: Lajean Manes M.D.   On: 10/31/2021 15:37    Radiological Exams on Admission: CT HEAD WO CONTRAST  Result Date: 10/31/2021 CLINICAL DATA:  Pt is currently receiving chemo for brain cancer. Pt states she went to bed normal last night. Pt woke up at 10AM stating her face was "twisting". Weakness noted to right side. EXAM: CT HEAD WITHOUT CONTRAST TECHNIQUE: Contiguous axial images were obtained from the base of the skull through the vertex without intravenous contrast. RADIATION DOSE REDUCTION: This exam was performed according to the departmental dose-optimization program which includes automated exposure control, adjustment of the mA and/or kV according to patient size and/or use of iterative reconstruction technique. COMPARISON:  Head CT, 03/17/2021.  Brain MRI, 10/11/2021. FINDINGS: Brain: Low-attenuation oval mass, posterior left frontal lobe, 3.3 cm. This appears increased in size compared to the recent brain MRI. There is significant surrounding hypoattenuation consistent with vasogenic edema. Small areas of hypoattenuation are noted in the anterior right frontal lobe, right temporal lobe and left occipital lobe consistent with vasogenic edema from additional lesions, similar to that seen on the prior brain MRI. There is mass effect with sulcal effacement, partial effacement of the left lateral ventricle and  midline shift to the left of 9 mm. Mass effect appears similar to the prior brain MRI. No convincing infarct. No intracranial hemorrhage. No extra-axial masses or abnormal fluid collections. Vascular: No hyperdense vessel or unexpected calcification. Skull: Normal. Negative for fracture or focal lesion. Sinuses/Orbits: Visualized globes and orbits are unremarkable. Visualized sinuses are clear. Other: None. IMPRESSION: 1. Left parietal lobe mass appears mildly increased in size compared to no a recent prior brain MRI. There is a similar amount associated vasogenic edema. Vasogenic edema associated with the smaller brain masses is also similar to the prior brain MRI. Mass effect is without significant change. Findings are consistent with metastatic disease, better defined on  the recent prior brain MRI. 2. No new abnormalities. No evidence of an ischemic infarct. No intracranial hemorrhage. Electronically Signed   By: Lajean Manes M.D.   On: 10/31/2021 15:37    DVT Prophylaxis -SCD /heparin AM Labs Ordered, also please review Full Orders  Family Communication: Admission, patients condition and plan of care including tests being ordered have been discussed with the patient and husband who indicate understanding and agree with the plan   Condition   stable  Roxan Hockey M.D on 10/31/2021 at 5:25 PM Go to [email or web address] -  for contact info  [facility] - Office  [phone]

## 2021-10-31 NOTE — Progress Notes (Signed)
Reached out to on-call neurologist  Dr. Bartholome Bill after patient arrived at [facility] -we Discussed over the phone - He recommends starting iv Keppra for seizure prophylaxis... - He also recommends neurosurgical consult in am after MRI brain report becomes available - Dr. Bartholome Bill advises that no neurology consult required at this time unless patient starts having seizures - He recommends low threshold for getting EEG  Roxan Hockey, MD  -

## 2021-10-31 NOTE — Progress Notes (Signed)
  Radiation Oncology         [phone]) [phone] ________________________________  Stereotactic Treatment Procedure Note  Name: LIANN SPAETH MRN: [id number]  Date: 10/29/2021  DOB: 07-26-1955  SPECIAL TREATMENT PROCEDURE    ICD-10-CM   1. 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 Primary malignant neoplasm of lung with metastasis to brain ([place])  C34.90    C79.31       3D TREATMENT PLANNING AND DOSIMETRY:  The patient's radiation plan was reviewed and approved by neurosurgery and radiation oncology prior to treatment.  It showed 3-dimensional radiation distributions overlaid onto the planning CT/MRI image set.  The [place] for the target structures as well as the organs at risk were reviewed. The documentation of the 3D plan and dosimetry are filed in the radiation oncology EMR.  NARRATIVE:  ADAN BEAL was brought to the TrueBeam stereotactic radiation treatment machine and placed supine on the CT couch. The head frame was applied, and the patient was set up for stereotactic radiosurgery.  Neurosurgery was present for the set-up and delivery  SIMULATION VERIFICATION:  In the couch zero-angle position, the patient underwent Exactrac imaging using the Brainlab system with orthogonal KV images.  These were carefully aligned and repeated to confirm treatment position for each of the isocenters.  The Exactrac snap film verification was repeated at each couch angle.  PROCEDURE: [name] received stereotactic radiosurgery to the following targets:  Left Parietal 28 mm target was treated using 7 Rapid Arc VMAT Beams to a prescription dose of 9 Gy which will be repeated 3 times for a total dose of 27 Gy.  ExacTrac registration was performed for each couch angle.  The 100% isodose line was prescribed.  6 MV X-rays were delivered in the flattening filter free beam mode.  Four other smaller targets   were treated using 7 Rapid Arc VMAT Beams to a prescription dose of 20 Gy.   ExacTrac registration was performed for each couch angle.  The 100% isodose line was prescribed.  6 MV X-rays were delivered in the flattening filter free beam mode.  STEREOTACTIC TREATMENT MANAGEMENT:  Following delivery, the patient was transported to nursing in stable condition and monitored for possible acute effects.  Vital signs were recorded BP 97/65 (BP Location: Left Arm, Patient Position: Sitting, Cuff Size: Normal)   Pulse 88   Temp (!) 97.4 F (36.3 C)   Resp 20   SpO2 100% . The patient tolerated treatment without significant acute effects, and was discharged to home in stable condition.    PLAN: Follow-up in one month.  ________________________________  Sheral Apley. Tammi Klippel, M.D.

## 2021-10-31 NOTE — ED Provider Notes (Signed)
[facility] EMERGENCY DEPARTMENT Provider Note  CSN: [id number] Arrival date & time: 10/31/21 1424  Chief Complaint(s) Weakness  HPI Sandra Horne is a 66 y.o. female with PMH lung cancer with metastatic brain metastases on chemotherapy and radiation who presents emergency department for evaluation of right-sided weakness.  Patient states that she has had some baseline right-sided upper and lower extremity weakness for months but went to sleep last night and woke up with worsening right-sided facial weakness and significant worsening right upper extremity weakness.  Her last radiation treatment was 2 days ago.  She states that she has been taken off of steroids and has not had these recently.  Last known well would be approximately 10 PM last night.  Denies associated chest pain, shortness of breath, headache, fever or other systemic symptoms.   Past Medical History Past Medical History:  Diagnosis Date   Cancer ([place])    Lung and Brain mets   Patient Active Problem List   Diagnosis Date Noted   Closed displaced fracture of right femoral neck ([place]) 10/12/2021   Malignant neoplasm metastatic to brain [facility]) 03/27/2021   Primary malignant neoplasm of lung with metastasis to brain ([place]) 03/26/2021   Acute encephalopathy 03/25/2021   Primary cancer of left lower lobe of lung ([place]) 01/14/2020   CLOSED FRACTURE OF UNSPECIFIED PART OF HUMERUS 04/01/2010   Home Medication(s) Prior to Admission medications   Medication Sig Start Date End Date Taking? Authorizing Provider  aspirin EC 81 MG tablet Take 1 tablet (81 mg total) by mouth 2 (two) times daily for 28 days. Swallow whole. 10/16/21 11/13/21  Shelly Coss, MD  dexamethasone (DECADRON) 4 MG tablet Take 1 tablet (4 mg total) by mouth daily after breakfast. Patient not taking: Reported on 10/28/2021 10/01/21   Bruning, [name], PA-C  HYDROcodone-acetaminophen (NORCO) 7.5-325 MG tablet Take 1 tablet by mouth every 6 (six) hours as needed for up  to 5 days for moderate pain. 10/28/21 11/02/21  Carole Civil, MD  methocarbamol (ROBAXIN) 500 MG tablet Take 1 tablet (500 mg total) by mouth 3 (three) times daily. 10/28/21   Carole Civil, MD  oxyCODONE (OXY IR/ROXICODONE) 5 MG immediate release tablet Take 1 tablet (5 mg total) by mouth every 4 (four) hours as needed for moderate pain (pain score 4-6). Patient not taking: Reported on 10/28/2021 10/15/21   Shelly Coss, MD  polyethylene glycol (MIRALAX / GLYCOLAX) 17 g packet Take 17 g by mouth daily. 10/17/21   Shelly Coss, MD                                                                                                                                    Past Surgical History Past Surgical History:  Procedure Laterality Date   BRONCHIAL BRUSHINGS  03/26/2021   Procedure: BRONCHIAL BRUSHINGS;  Surgeon: Margaretha Seeds, MD;  Location: [facility];  Service: Cardiopulmonary;;   BRONCHIAL NEEDLE ASPIRATION BIOPSY  03/26/2021   Procedure: BRONCHIAL NEEDLE ASPIRATION BIOPSIES;  Surgeon: Margaretha Seeds, MD;  Location: [facility];  Service: Cardiopulmonary;;   BRONCHIAL WASHINGS  03/26/2021   Procedure: BRONCHIAL WASHINGS;  Surgeon: Margaretha Seeds, MD;  Location: [facility];  Service: Cardiopulmonary;;   ECTOPIC PREGNANCY SURGERY     ENDOBRONCHIAL ULTRASOUND Bilateral 03/26/2021   Procedure: ENDOBRONCHIAL ULTRASOUND;  Surgeon: Margaretha Seeds, MD;  Location: [facility];  Service: Cardiopulmonary;  Laterality: Bilateral;   HEMOSTASIS CONTROL  03/26/2021   Procedure: HEMOSTASIS CONTROL;  Surgeon: Margaretha Seeds, MD;  Location: [facility];  Service: Cardiopulmonary;;   HIP ARTHROPLASTY Right 10/13/2021   Procedure: ARTHROPLASTY BIPOLAR HIP (HEMIARTHROPLASTY);  Surgeon: Carole Civil, MD;  Location: [facility];  Service: Orthopedics;  Laterality: Right;   Family History Family History  Problem Relation Age of Onset   Hypertension Mother    Stroke Mother     Hypertension Father    Cancer Sister    Cancer Paternal Uncle     Social History Social History   Tobacco Use   Smoking status: Some Days    Packs/day: 0.25    Years: 40.00    Total pack years: 10.00    Types: Cigarettes   Smokeless tobacco: Never  Vaping Use   Vaping Use: Never used  Substance Use Topics   Alcohol use: Not Currently    Comment: drinks beer rarely    Drug use: Not Currently    Types: Marijuana    Comment: last marijuana use mid 11/19/2019   Allergies Patient has no known allergies.  Review of Systems Review of Systems  Neurological:  Positive for facial asymmetry and weakness.    Physical Exam Vital Signs  I have reviewed the triage vital signs BP 125/86 (BP Location: Right Arm)   Pulse 96   Temp 98.4 F (36.9 C) (Oral)   Resp 16   Ht 5\' 4"  (1.626 m)   Wt 64 kg   SpO2 99%   BMI 24.20 kg/m   Physical Exam Vitals and nursing note reviewed.  Constitutional:      General: She is not in acute distress.    Appearance: She is well-developed.  HENT:     Head: Normocephalic and atraumatic.  Eyes:     Conjunctiva/sclera: Conjunctivae normal.  Cardiovascular:     Rate and Rhythm: Normal rate and regular rhythm.     Heart sounds: No murmur heard. Pulmonary:     Effort: Pulmonary effort is normal. No respiratory distress.     Breath sounds: Normal breath sounds.  Abdominal:     Palpations: Abdomen is soft.     Tenderness: There is no abdominal tenderness.  Musculoskeletal:        General: No swelling.     Cervical back: Neck supple.  Skin:    General: Skin is warm and dry.     Capillary Refill: Capillary refill takes less than 2 seconds.  Neurological:     Mental Status: She is alert.     Cranial Nerves: Cranial nerve deficit present.     Motor: Weakness present.  Psychiatric:        Mood and Affect: Mood normal.     ED Results and Treatments Labs (all labs ordered are listed, but only abnormal results are displayed) Labs Reviewed   CBG MONITORING, ED - Abnormal; Notable for the following components:      Result Value   Glucose-Capillary 137 (*)    All other components within normal limits  I-STAT CHEM 8, ED - Abnormal; Notable for the following components:   BUN 4 (*)    Glucose, Bld 129 (*)    Hemoglobin 10.2 (*)    HCT 30.0 (*)    All other components within normal limits  CBG MONITORING, ED - Abnormal; Notable for the following components:   Glucose-Capillary 116 (*)    All other components within normal limits  PROTIME-INR  APTT  CBC  DIFFERENTIAL  COMPREHENSIVE METABOLIC PANEL  ETHANOL                                                                                                                          Radiology CT HEAD WO CONTRAST  Result Date: 10/31/2021 CLINICAL DATA:  Pt is currently receiving chemo for brain cancer. Pt states she went to bed normal last night. Pt woke up at 10AM stating her face was "twisting". Weakness noted to right side. EXAM: CT HEAD WITHOUT CONTRAST TECHNIQUE: Contiguous axial images were obtained from the base of the skull through the vertex without intravenous contrast. RADIATION DOSE REDUCTION: This exam was performed according to the departmental dose-optimization program which includes automated exposure control, adjustment of the mA and/or kV according to patient size and/or use of iterative reconstruction technique. COMPARISON:  Head CT, 03/17/2021.  Brain MRI, 10/11/2021. FINDINGS: Brain: Low-attenuation oval mass, posterior left frontal lobe, 3.3 cm. This appears increased in size compared to the recent brain MRI. There is significant surrounding hypoattenuation consistent with vasogenic edema. Small areas of hypoattenuation are noted in the anterior right frontal lobe, right temporal lobe and left occipital lobe consistent with vasogenic edema from additional lesions, similar to that seen on the prior brain MRI. There is mass effect with sulcal effacement, partial effacement  of the left lateral ventricle and midline shift to the left of 9 mm. Mass effect appears similar to the prior brain MRI. No convincing infarct. No intracranial hemorrhage. No extra-axial masses or abnormal fluid collections. Vascular: No hyperdense vessel or unexpected calcification. Skull: Normal. Negative for fracture or focal lesion. Sinuses/Orbits: Visualized globes and orbits are unremarkable. Visualized sinuses are clear. Other: None. IMPRESSION: 1. Left parietal lobe mass appears mildly increased in size compared to no a recent prior brain MRI. There is a similar amount associated vasogenic edema. Vasogenic edema associated with the smaller brain masses is also similar to the prior brain MRI. Mass effect is without significant change. Findings are consistent with metastatic disease, better defined on the recent prior brain MRI. 2. No new abnormalities. No evidence of an ischemic infarct. No intracranial hemorrhage. Electronically Signed   By: Lajean Manes M.D.   On: 10/31/2021 15:37    Pertinent labs & imaging results that were available during my care of the patient were reviewed by me and considered in my medical decision making (see MDM for details).  Medications Ordered in ED Medications  sodium chloride flush (NS) 0.9 % injection 3 mL (3 mLs Intravenous Not Given 10/31/21 1521)  dexamethasone (DECADRON) injection 10 mg (has no administration in time range)                                                                                                                                     Procedures .Critical Care  Performed by: Teressa Lower, MD Authorized by: Teressa Lower, MD   Critical care provider statement:    Critical care time (minutes):  30   Critical care was necessary to treat or prevent imminent or life-threatening deterioration of the following conditions: Brain cancer with edema.   Critical care was time spent personally by me on the following activities:  Development of  treatment plan with patient or surrogate, discussions with consultants, evaluation of patient's response to treatment, examination of patient, ordering and review of laboratory studies, ordering and review of radiographic studies, ordering and performing treatments and interventions, pulse oximetry, re-evaluation of patient's condition and review of old charts   (including critical care time)  Medical Decision Making / ED Course   This patient presents to the ED for concern of facial weakness, upper extremity weakness, this involves an extensive number of treatment options, and is a complaint that carries with it a high risk of complications and morbidity.  The differential diagnosis includes vasogenic edema secondary to radiation treatment, intracranial hemorrhage, CVA, worsening malignancy  MDM: Patient seen emergency room for evaluation of weakness.  Physical exam with right facial droop and right upper extremity weakness worse than patient's baseline.  Evaluation largely unremarkable.  Initial CT head with a left parietal lobe mass mildly increased in size compared to recent brain MRI with associated vasogenic edema.  Radiation oncology was consulted and I spoke with the patient's primary rad unk physician Dr. Tammi Klippel who recommends Decadron now with 4 mg Decadron every 6 and hospital observation.  Patient will require hospital admission to [facility] as [facility] does not have MRI or neurosurgical coverage over the weekend.  Patient then admitted.   Additional history obtained: -Additional history obtained from husband -External records from outside source obtained and reviewed including: Chart review including previous notes, labs, imaging, consultation notes   Lab Tests: -I ordered, reviewed, and interpreted labs.   The pertinent results include:   Labs Reviewed  CBG MONITORING, ED - Abnormal; Notable for the following components:      Result Value   Glucose-Capillary 137 (*)    All  other components within normal limits  I-STAT CHEM 8, ED - Abnormal; Notable for the following components:   BUN 4 (*)    Glucose, Bld 129 (*)    Hemoglobin 10.2 (*)    HCT 30.0 (*)    All other components within normal limits  CBG MONITORING, ED - Abnormal; Notable for the following components:   Glucose-Capillary 116 (*)    All other components within normal limits  PROTIME-INR  APTT  CBC  DIFFERENTIAL  COMPREHENSIVE METABOLIC PANEL  ETHANOL    Imaging Studies ordered: I ordered imaging studies including CT head I independently visualized and interpreted imaging. I agree with the radiologist interpretation   Medicines ordered and prescription drug management: Meds ordered this encounter  Medications   sodium chloride flush (NS) 0.9 % injection 3 mL   dexamethasone (DECADRON) injection 10 mg    -I have reviewed the patients home medicines and have made adjustments as needed  Critical interventions Steroids, multiple consultations  Consultations Obtained: I requested consultation with the radiation oncologist Dr. Tammi Klippel,  and discussed lab and imaging findings as well as pertinent plan - they recommend: Decadron every 6 and hospital admission   Cardiac Monitoring: The patient was maintained on a cardiac monitor.  I personally viewed and interpreted the cardiac monitored which showed an underlying rhythm of: NSR  Social Determinants of Health:  Factors impacting patients care include: none   Reevaluation: After the interventions noted above, I reevaluated the patient and found that they have :stayed the same  Co morbidities that complicate the patient evaluation  Past Medical History:  Diagnosis Date   Cancer ([place])    Lung and Brain mets      Dispostion: I considered admission for this patient, and given current brain metastases with vasogenic edema requiring steroids, patient require hospital admission     Final Clinical Impression(s) / ED  Diagnoses Final diagnoses:  None     @PCDICTATION @    Teressa Lower, MD 10/31/21 1610

## 2021-10-31 NOTE — ED Notes (Signed)
At time of transfer, pt denied complaints of pain or sick feeling. Neuro symptoms persist with no significant changes.

## 2021-10-31 NOTE — ED Triage Notes (Signed)
Pt is currently receiving chemo for brain cancer. Pt states she went to bed normal last night. Pt woke up at 10AM stating her face was "twisting". Weakness noted to right side. Kae Heller PA to triage to eval.

## 2021-11-01 DIAGNOSIS — R531 Weakness: Secondary | ICD-10-CM | POA: Diagnosis not present

## 2021-11-01 LAB — CBC
HCT: 30.3 % — ABNORMAL LOW (ref 36.0–46.0)
Hemoglobin: 9.9 g/dL — ABNORMAL LOW (ref 12.0–15.0)
MCH: 27 pg (ref 26.0–34.0)
MCHC: 32.7 g/dL (ref 30.0–36.0)
MCV: 82.6 fL (ref 80.0–100.0)
Platelets: 621 10*3/uL — ABNORMAL HIGH (ref 150–400)
RBC: 3.67 MIL/uL — ABNORMAL LOW (ref 3.87–5.11)
RDW: 16.6 % — ABNORMAL HIGH (ref 11.5–15.5)
WBC: 5.4 10*3/uL (ref 4.0–10.5)
nRBC: 0 % (ref 0.0–0.2)

## 2021-11-01 LAB — GLUCOSE, CAPILLARY
Glucose-Capillary: 135 mg/dL — ABNORMAL HIGH (ref 70–99)
Glucose-Capillary: 150 mg/dL — ABNORMAL HIGH (ref 70–99)
Glucose-Capillary: 167 mg/dL — ABNORMAL HIGH (ref 70–99)
Glucose-Capillary: 151 mg/dL — ABNORMAL HIGH (ref 70–99)
Glucose-Capillary: 161 mg/dL — ABNORMAL HIGH (ref 70–99)

## 2021-11-01 LAB — BASIC METABOLIC PANEL
Anion gap: 8 (ref 5–15)
BUN: 7 mg/dL — ABNORMAL LOW (ref 8–23)
CO2: 22 mmol/L (ref 22–32)
Calcium: 8.9 mg/dL (ref 8.9–10.3)
Chloride: 109 mmol/L (ref 98–111)
Creatinine, Ser: 0.57 mg/dL (ref 0.44–1.00)
GFR, Estimated: >60 mL/min (ref >60)
Glucose, Bld: 163 mg/dL — ABNORMAL HIGH (ref 70–99)
Potassium: 4.1 mmol/L (ref 3.5–5.1)
Sodium: 139 mmol/L (ref 135–145)

## 2021-11-01 MED ORDER — LEVETIRACETAM 500 MG PO TABS
500.0000 mg | ORAL_TABLET | Freq: Two times a day (BID) | ORAL | Status: DC
Start: 2021-11-01 — End: 2021-11-03
  Administered 2021-11-01 – 2021-11-03 (×5): 500 mg via ORAL
  Filled 2021-11-01 (×5): qty 1

## 2021-11-01 MED ORDER — GADOBUTROL 1 MMOL/ML IV SOLN
6.4000 mL | Freq: Once | INTRAVENOUS | Status: AC | PRN
  Administered 2021-11-01: 6.4 mL via INTRAVENOUS

## 2021-11-01 NOTE — Progress Notes (Signed)
PROGRESS NOTE    Sandra Horne  OFB:[id number] DOB: 09-01-1955 DOA: 10/31/2021 PCP: Patient, No Pcp Per    Brief Narrative:  66 year old smoker initially diagnosed with left infrahilar lung mass September 2021 but lost follow-up, December 2022 found to have headache weakness and confusion.  Bronchial biopsy with non-small cell carcinoma and brain MRI with brain mets.  Following up with medical oncology and radiation oncology.  Presented to the emergency room with worsening right hand weakness after recently going radiation treatment and also discontinued steroids.  CT head showed worsening vasogenic edema.  Patient transferred from [facility] to [facility] for neurology and neurosurgical input.   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  Patient was on dexamethasone that was stopped on 8/3. Started on Decadron 4 mg every 6 hours. MRI brain with multiple brain metastasis with vasogenic edema. We will notify her oncologist and radiation oncologist if any inpatient procedures/benefits with procedures. Currently hemodynamically stabilizing. Start working with PT OT today.  Steroid-induced hyperglycemia: Cover with sliding scale insulin.  Smoker: Nicotine patch.  Prognosis: Expecting her oncologist to discuss prognosis with the patient and family.   DVT prophylaxis: heparin injection 5,000 Units Start: 10/31/21 2200 SCDs Start: 10/31/21 1709 Place TED hose Start: 10/31/21 1709   Code Status: Full code Family Communication: Husband at the bedside Disposition Plan: Status is: Inpatient Remains inpatient appropriate because: IV steroids, worsening neurological status     Consultants:  Radiation oncology, message sent Oncology, message sent Neurology, curbside.  They do not have anything to add.  Procedures:  None  Antimicrobials:   None   Subjective: Patient seen and examined.  Husband was at the bedside.  On my morning evaluation, patient complained of persistent right hand weakness but no other complaints.  She was able to swallow food normally. Patient tells me that someone told her that keep taking steroids is not good for her health.  Objective: Vitals:   11/01/21 0226 11/01/21 0452 11/01/21 0848 11/01/21 1142  BP: 100/68 119/72 122/64 (!) 142/73  Pulse: 89 89 96 86  Resp: 18 16 20 19   Temp: 98.4 F (36.9 C) 97.6 F (36.4 C) 98.5 F (36.9 C) 98.4 F (36.9 C)  TempSrc: Oral Oral Oral Oral  SpO2: 96% 100% 99% 96%  Weight:      Height:       No intake or output data in the 24 hours ending 11/01/21 1427 Filed Weights   10/31/21 1433 10/31/21 1832  Weight: 64 kg 64.3 kg    Examination:  General exam: Appears calm and comfortable  Frail and debilitated.  Chronically sick looking. Respiratory system: No added sounds. Cardiovascular system: S1 & S2 heard, RRR. No JVD, murmurs, rubs, gallops or clicks. No pedal edema. Gastrointestinal system: Abdomen is nondistended, soft and nontender. No organomegaly or masses felt. Normal bowel sounds heard. Central nervous system: Alert and oriented.  No cranial nerve deficits. Right upper extremity 3/5.  Right lower extremity 4/5.  Left upper and lower extremity normal.    Data Reviewed: I have personally reviewed following labs and imaging studies  CBC: Recent Labs  Lab 10/31/21 1510 10/31/21 1528  WBC 7.1  --   NEUTROABS 4.3  --   HGB 9.7* 10.2*  HCT 31.5* 30.0*  MCV 86.5  --   PLT 671*  --    Basic Metabolic Panel: Recent Labs  Lab 10/31/21 1510 10/31/21 1528  NA 140 140  K 3.7 3.6  CL 106 102  CO2 27  --   GLUCOSE 135* 129*  BUN 7* 4*  CREATININE 0.65 0.60  CALCIUM 8.8*  --    GFR: Estimated Creatinine Clearance: 61.3 mL/min (by C-G formula based on SCr of 0.6 mg/dL). Liver Function Tests: Recent Labs  Lab 10/31/21 1510  AST 18   ALT 23  ALKPHOS 99  BILITOT 0.8  PROT 7.1  ALBUMIN 3.1*   No results for input(s): "LIPASE", "AMYLASE" in the last 168 hours. No results for input(s): "AMMONIA" in the last 168 hours. Coagulation Profile: Recent Labs  Lab 10/31/21 1510  INR 1.0   Cardiac Enzymes: No results for input(s): "CKTOTAL", "CKMB", "CKMBINDEX", "TROPONINI" in the last 168 hours. BNP (last 3 results) No results for input(s): "PROBNP" in the last 8760 hours. HbA1C: No results for input(s): "HGBA1C" in the last 72 hours. CBG: Recent Labs  Lab 10/31/21 1519 10/31/21 2120 11/01/21 0613 11/01/21 0629 11/01/21 1202  GLUCAP 116* 222* 150* 135* 167*   Lipid Profile: No results for input(s): "CHOL", "HDL", "LDLCALC", "TRIG", "CHOLHDL", "LDLDIRECT" in the last 72 hours. Thyroid Function Tests: No results for input(s): "TSH", "T4TOTAL", "FREET4", "T3FREE", "THYROIDAB" in the last 72 hours. Anemia Panel: No results for input(s): "VITAMINB12", "FOLATE", "FERRITIN", "TIBC", "IRON", "RETICCTPCT" in the last 72 hours. Sepsis Labs: No results for input(s): "PROCALCITON", "LATICACIDVEN" in the last 168 hours.  No results found for this or any previous visit (from the past 240 hour(s)).       Radiology Studies: MR BRAIN W WO CONTRAST  Result Date: 11/01/2021 CLINICAL DATA:  Metastatic lung carcinoma EXAM: MRI HEAD WITHOUT AND WITH CONTRAST TECHNIQUE: Multiplanar, multiecho pulse sequences of the brain and surrounding structures were obtained without and with intravenous contrast. CONTRAST:  6.17mL GADAVIST GADOBUTROL 1 MMOL/ML IV SOLN COMPARISON:  10/11/2021 FINDINGS: Brain: There are 6 peripherally contrast-enhancing lesions, 4 of which have increased in size. The left occipital lesion is slightly smaller. The left paramedian frontal lobe lesion is unchanged. No acute or chronic hemorrhage. The midline structures are normal. 1. Slightly decreased size of left occipital lesion, now 8 mm series 22, image 15 2.  Increased size and worsened edema of right temporal lesion, now 8 mm, image 16 3. Increased size of predominantly cystic lesion of the left parietal lobe with worsened surrounding edema, now 3.4 cm, image 33 4. Increased size of peripheral left frontal lesion, 4 mm with worsened surrounding edema. Image 33 5. Unchanged left paramedian frontal lobe lesion, 3 mm.  Image 33 6. Increased size of right frontal lesion, 6 mm, image 36 7. There are no new lesions. Vascular: Major flow voids are preserved. Skull and upper cervical spine: Normal calvarium and skull base. Visualized upper cervical spine and soft tissues are normal. Sinuses/Orbits:No paranasal sinus fluid levels or advanced mucosal thickening. No mastoid or middle ear effusion. Normal orbits. IMPRESSION: 1. Increased size of 4 of the 6 metastatic lesions with worsened associated edema. 2. Unchanged left paramedian frontal lobe metastasis and slightly decreased size of left occipital metastasis. 3. No new lesions. Electronically Signed   By: Ulyses Jarred M.D.   On: 11/01/2021 01:02   CT HEAD WO CONTRAST  Result Date: 10/31/2021 CLINICAL DATA:  Pt is currently receiving chemo for brain cancer. Pt states she went to bed normal last night. Pt woke up at 10AM stating her face was "twisting". Weakness noted to right side. EXAM: CT HEAD WITHOUT CONTRAST TECHNIQUE: Contiguous axial images  were obtained from the base of the skull through the vertex without intravenous contrast. RADIATION DOSE REDUCTION: This exam was performed according to the departmental dose-optimization program which includes automated exposure control, adjustment of the mA and/or kV according to patient size and/or use of iterative reconstruction technique. COMPARISON:  Head CT, 03/17/2021.  Brain MRI, 10/11/2021. FINDINGS: Brain: Low-attenuation oval mass, posterior left frontal lobe, 3.3 cm. This appears increased in size compared to the recent brain MRI. There is significant surrounding  hypoattenuation consistent with vasogenic edema. Small areas of hypoattenuation are noted in the anterior right frontal lobe, right temporal lobe and left occipital lobe consistent with vasogenic edema from additional lesions, similar to that seen on the prior brain MRI. There is mass effect with sulcal effacement, partial effacement of the left lateral ventricle and midline shift to the left of 9 mm. Mass effect appears similar to the prior brain MRI. No convincing infarct. No intracranial hemorrhage. No extra-axial masses or abnormal fluid collections. Vascular: No hyperdense vessel or unexpected calcification. Skull: Normal. Negative for fracture or focal lesion. Sinuses/Orbits: Visualized globes and orbits are unremarkable. Visualized sinuses are clear. Other: None. IMPRESSION: 1. Left parietal lobe mass appears mildly increased in size compared to no a recent prior brain MRI. There is a similar amount associated vasogenic edema. Vasogenic edema associated with the smaller brain masses is also similar to the prior brain MRI. Mass effect is without significant change. Findings are consistent with metastatic disease, better defined on the recent prior brain MRI. 2. No new abnormalities. No evidence of an ischemic infarct. No intracranial hemorrhage. Electronically Signed   By: Lajean Manes M.D.   On: 10/31/2021 15:37        Scheduled Meds:  aspirin EC  81 mg Oral BID   dexamethasone (DECADRON) injection  4 mg Intravenous Q6H   feeding supplement  237 mL Oral BID BM   heparin  5,000 Units Subcutaneous Q8H   insulin aspart  0-5 Units Subcutaneous QHS   insulin aspart  0-9 Units Subcutaneous TID WC   levETIRAcetam  500 mg Oral BID   methocarbamol  500 mg Oral Q8H   nicotine  21 mg Transdermal Daily   polyethylene glycol  17 g Oral Daily   sodium chloride flush  3 mL Intravenous Once   sodium chloride flush  3 mL Intravenous Q12H   sodium chloride flush  3 mL Intravenous Q12H   Continuous  Infusions:  sodium chloride       LOS: 1 day    Time spent: 35 minutes    Barb Merino, MD [facility] Pager [phone]

## 2021-11-02 ENCOUNTER — Ambulatory Visit: Payer: Medicare Other | Admitting: Radiation Oncology

## 2021-11-02 DIAGNOSIS — C3490 Malignant neoplasm of unspecified part of unspecified bronchus or lung: Secondary | ICD-10-CM

## 2021-11-02 DIAGNOSIS — E440 Moderate protein-calorie malnutrition: Secondary | ICD-10-CM | POA: Diagnosis not present

## 2021-11-02 DIAGNOSIS — R531 Weakness: Secondary | ICD-10-CM | POA: Diagnosis not present

## 2021-11-02 DIAGNOSIS — Z720 Tobacco use: Secondary | ICD-10-CM

## 2021-11-02 DIAGNOSIS — C7931 Secondary malignant neoplasm of brain: Secondary | ICD-10-CM

## 2021-11-02 DIAGNOSIS — Z515 Encounter for palliative care: Secondary | ICD-10-CM

## 2021-11-02 DIAGNOSIS — Z789 Other specified health status: Secondary | ICD-10-CM

## 2021-11-02 LAB — GLUCOSE, CAPILLARY
Glucose-Capillary: 154 mg/dL — ABNORMAL HIGH (ref 70–99)
Glucose-Capillary: 141 mg/dL — ABNORMAL HIGH (ref 70–99)
Glucose-Capillary: 135 mg/dL — ABNORMAL HIGH (ref 70–99)
Glucose-Capillary: 120 mg/dL — ABNORMAL HIGH (ref 70–99)
Glucose-Capillary: 119 mg/dL — ABNORMAL HIGH (ref 70–99)

## 2021-11-02 MED ORDER — ADULT MULTIVITAMIN W/MINERALS CH
1.0000 | ORAL_TABLET | Freq: Every day | ORAL | Status: DC
  Administered 2021-11-02 – 2021-11-03 (×2): 1 via ORAL
  Filled 2021-11-02 (×2): qty 1

## 2021-11-02 MED ORDER — ENSURE ENLIVE PO LIQD
237.0000 mL | Freq: Three times a day (TID) | ORAL | Status: DC
  Administered 2021-11-02 – 2021-11-03 (×4): 237 mL via ORAL

## 2021-11-02 NOTE — TOC Initial Note (Signed)
Transition of Care [facility] - Initial/Assessment Note    Patient Details  Name: Sandra Horne MRN: [id number] Date of Birth: [birth year]  Transition of [facility]) CM/SW Contact:    Pollie Friar, RN Phone Number: 11/02/2021, 4:19 PM  Clinical Narrative:                 Pt is from home with her spouse but she will be d/cing to her daughters home: [address] in [place].  She has needed DME at home.  Daughter will over see her medications and provide needed transportation.  Home health arranged through [facility] with information on the AVS.  Daughter will transport home when medically ready.   Expected Discharge Plan: [place] Barriers to Discharge: Continued Medical Work up   Patient Goals and CMS Choice   CMS Medicare.gov Compare Post Acute Care list provided to:: Patient Choice offered to / list presented to : Patient  Expected Discharge Plan and Services Expected Discharge Plan: [place]   Discharge Planning Services: CM Consult Post Acute Care Choice: [place] arrangements for the past 2 months: Single Family Home                           HH Arranged: PT, OT [facility] Agency: [facility] Date [place]: 11/02/21   Representative spoke with at [place]: Delsa Sale  Prior Living Arrangements/Services Living arrangements for the past 2 months: [place] Lives with:: Adult Children Patient language and need for interpreter reviewed:: Yes Do you feel safe going back to the place where you live?: Yes      Need for Family Participation in Patient Care: Yes (Comment) Care giver support system in place?: Yes (comment)   Criminal Activity/Legal Involvement Pertinent to Current Situation/Hospitalization: No - Comment as needed  Activities of Daily Living Home Assistive Devices/Equipment: Environmental consultant (specify type), Wheelchair ADL Screening (condition at time of admission) Patient's cognitive ability  adequate to safely complete daily activities?: Yes Is the patient deaf or have difficulty hearing?: No Does the patient have difficulty seeing, even when wearing glasses/contacts?: No Does the patient have difficulty concentrating, remembering, or making decisions?: Yes Patient able to express need for assistance with ADLs?: No Does the patient have difficulty dressing or bathing?: No Independently performs ADLs?: No Communication: Independent Dressing (OT): Needs assistance Is this a change from baseline?: Change from baseline, expected to last >3 days Grooming: Independent Feeding: Independent with device (comment) (needs setup) Bathing: Needs assistance Is this a change from baseline?: Change from baseline, expected to last >3 days Toileting: Needs assistance Is this a change from baseline?: Change from baseline, expected to last >3days In/Out Bed: Needs assistance Is this a change from baseline?: Change from baseline, expected to last >3 days Walks in Home: Needs assistance Is this a change from baseline?: Change from baseline, expected to last >3 days Does the patient have difficulty walking or climbing stairs?: Yes Weakness of Legs: Right Weakness of Arms/Hands: Right  Permission Sought/Granted                  Emotional Assessment Appearance:: Appears stated age Attitude/Demeanor/Rapport: Engaged Affect (typically observed): Accepting Orientation: : Oriented to Self, Oriented to Place, Oriented to  Time, Oriented to Situation   Psych Involvement: No (comment)  Admission diagnosis:  Weakness [R53.1] Patient Active Problem List   Diagnosis Date Noted   Malnutrition of  moderate degree 11/02/2021   Rt Sided Weakness due to Vasogenic Brain edema 10/31/2021   Tobacco abuse 10/31/2021   Closed displaced fracture of right femoral neck ([place]) 10/12/2021   Malignant neoplasm metastatic to brain [facility]) 03/27/2021   Primary malignant neoplasm of the Lt Lung with metastasis to  brain ([place]) 03/26/2021   Acute encephalopathy 03/25/2021   Primary cancer of left lower lobe of lung ([place]) 01/14/2020   CLOSED FRACTURE OF UNSPECIFIED PART OF HUMERUS 04/01/2010   PCP:  Patient, No Pcp Per Pharmacy:   [facility] [place] [facility] [id number] [place] [address] Phone: [phone] Fax: [phone]     Social Determinants of Health (SDOH) Interventions    Readmission Risk Interventions    10/14/2021   10:00 AM  Readmission Risk Prevention Plan  Transportation Screening Complete  PCP or Specialist Appt within 5-7 Days Not Complete  Home Care Screening Complete  Medication Review (RN CM) Complete

## 2021-11-02 NOTE — Evaluation (Signed)
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   Clinical Impression   PTA, pt lived with her daughter who assisted with IADLs and driving. Currently, pt presents with decreased functional use of RUE, strength, balance, coordination, problem solving, attention, and memory. Pt requiring min guard A-mod A for ADLs at this time. Requiring mod A for oral care during session for manipulation of items and problem solving. Performing functional mobility with RW with min guard-min A and verbal cues for RW use. Pt reporting good support at home, but would recommend home health aid at home pending family perception of caregiver burden. Pt would benefit from continued OT services with HHOT to optimize safety and independence with ADL and IADL. Will continue to follow acutely.      Recommendations for follow up therapy are one component of a multi-disciplinary discharge planning process, led by the attending physician.  Recommendations may be updated based on patient status, additional functional criteria and insurance authorization.   Follow Up Recommendations  Skilled nursing-short term rehab (<3 hours/day)    Assistance Recommended at Discharge Frequent or constant Supervision/Assistance  Patient can return home with the following A little help with walking and/or transfers;A lot of help with bathing/dressing/bathroom;Assistance with cooking/housework;Assistance with feeding;Direct supervision/assist for medications management;Assist for transportation;Direct supervision/assist for financial management;Help with stairs or ramp for entrance    Functional Status Assessment  Patient has had a recent decline in their functional status and  demonstrates the ability to make significant improvements in function in a reasonable and predictable amount of time.  Equipment Recommendations  None recommended by OT (Pt has all recommended equipment)    Recommendations for Other Services       Precautions / Restrictions Precautions Precautions: Fall Restrictions Weight Bearing Restrictions: No      Mobility Bed Mobility Overal bed mobility: Needs Assistance Bed Mobility: Supine to Sit     Supine to sit: Min guard     General bed mobility comments: Increased time    Transfers Overall transfer level: Needs assistance Equipment used: Rolling walker (2 wheels) Transfers: Sit to/from Stand Sit to Stand: Min assist           General transfer comment: Min assist for boost to stand. Cues for hand placement with sit<>stand transitions - able to sit with good control. Uses RW appropriately; occasional cues for placement due to RW shifted toward L and RLE outside of walker 2x during session with turning towards surfaces.      Balance Overall balance assessment: Needs assistance Sitting-balance support: No upper extremity supported, Feet supported Sitting balance-Leahy Scale: Fair Sitting balance - Comments: seated EOB and pulling up socks. Initially with R lateral lean upon achieving upright position EOB Postural control: Right lateral lean Standing balance support: Bilateral upper extremity supported, During functional activity Standing balance-Leahy Scale: Poor Standing balance comment: Reliant on RW                           ADL either performed or assessed with clinical judgement   ADL Overall ADL's : Needs assistance/impaired Eating/Feeding: Set up;Sitting   Grooming: Moderate assistance;Sitting Grooming Details (indicate cue type and reason): Mod A for problem solving and manipulating items required for oral care. Upper Body Bathing: Moderate  assistance;Sitting   Lower Body Bathing: Minimal  assistance;Sit to/from stand   Upper Body Dressing : Moderate assistance;Sitting   Lower Body Dressing: Moderate assistance;Sit to/from stand Lower Body Dressing Details (indicate cue type and reason): Pulling up socks with LUE at beginning of session with min guard A, however, anticipate would require assistance to don socks due to Bil task, and assist for pulling up pants. Toilet Transfer: Minimal assistance;Ambulation;Rolling walker (2 wheels);Comfort height toilet;Grab bars Toilet Transfer Details (indicate cue type and reason): Simulated         Functional mobility during ADLs: Minimal assistance;Rolling walker (2 wheels) General ADL Comments: Limited by decreased functional use of RUE, balance, strength, activity tolerance, problem solving.     Vision Patient Visual Report: No change from baseline Vision Assessment?: Yes Eye Alignment: Within Functional Limits Ocular Range of Motion: Within Functional Limits Tracking/Visual Pursuits: Impaired - to be further tested in functional context (Decreased visual attention; difficulty attenting to visual traching task, especially with distractions present (Food service walking through door)) Visual Fields:  (Unable to state how many fingers therapist is holding up in L inferior quadrant without compensatory strategies) Diplopia Assessment:  (denies diplopia) Additional Comments: Continue to assess.     Perception     Praxis Praxis Praxis-Other Comments: Difficulty motor planning during BUE    Pertinent Vitals/Pain Pain Assessment Pain Assessment: Faces Faces Pain Scale: No hurt     Hand Dominance Right   Extremity/Trunk Assessment Upper Extremity Assessment Upper Extremity Assessment: RUE deficits/detail;LUE deficits/detail RUE Deficits / Details: Some initiation of movement; shoulder flexion ~20degrees; grip strength 3/5; inability to perform finger opposition, and reporting she wants to, but her hand just will not. Limited  active use of RUE, and often unaware of items in hand (catheter tubing in hand, and pt attempting to perform finger opposition with tubing in hand) RUE Sensation: decreased light touch (pt reportig it feels numb) RUE Coordination: decreased fine motor;decreased gross motor LUE Deficits / Details: shoulder flexion ~90 degrees, grip strength 4-/5   Lower Extremity Assessment Lower Extremity Assessment: Defer to PT evaluation   Cervical / Trunk Assessment Cervical / Trunk Assessment: Normal   Communication Communication Communication: No difficulties   Cognition Arousal/Alertness: Awake/alert Behavior During Therapy: WFL for tasks assessed/performed Overall Cognitive Status: Impaired/Different from baseline Area of Impairment: Attention, Memory, Following commands, Safety/judgement, Awareness, Problem solving                   Current Attention Level: Sustained Memory: Decreased short-term memory Following Commands: Follows one step commands consistently, Follows one step commands with increased time, Follows multi-step commands inconsistently Safety/Judgement: Decreased awareness of safety, Decreased awareness of deficits Awareness: Emergent Problem Solving: Slow processing, Requires verbal cues, Difficulty sequencing, Requires tactile cues General Comments: Pt requiring verbal cues for safety throughout session due to inattention to RUE, est during standing activity (requires cues to grasp RW with BUE). Pt requiring verbal cues for problem solving during oral care due to decreased functional use of RUE. Pt attempting to order lunch on OT arrival, and with skill to locate items on menu, but difficulty finding phone number; placed on hold ~1 minute and only able to recall 2/4 items she was wishing to order when asked.     General Comments       Exercises     Shoulder Instructions      Home Living Family/patient expects to be discharged to:: Private residence Living  Arrangements: Children (daughter, victoria) Available Help at Discharge: Family;Available 24  hours/day Type of Home: House Home Access: Stairs to enter [employer] of Steps: 1 Entrance Stairs-Rails: None Home Layout: One level     Bathroom Shower/Tub: Walk-in shower (walk in shower at her home, tub/shower unit at her daughter's home.)   Bathroom Toilet: Standard     Home Equipment: Conservation officer, nature (2 wheels);BSC/3in1;Wheelchair - manual;Hospital bed;Grab bars - toilet   Additional Comments: Pt reporting she has been staying with her dughter, but between daughter and husband, she can have 24/7 assist if needed      Prior Functioning/Environment Prior Level of Function : Independent/Modified Independent;History of Falls (last six months)             Mobility Comments: States she quit driving 1 month ago. Has been using RW to ambulate at home since. ADLs Comments: Independent; pt reports bathing and dressing were hard after radiation and took increased time, but no physical A required. Pt reports daughter assist with IADL such as driving and cleaning as she has been living with daughter        OT Problem List: Decreased strength;Decreased activity tolerance;Impaired balance (sitting and/or standing);Decreased range of motion;Impaired vision/perception;Decreased coordination;Decreased cognition;Decreased safety awareness;Decreased knowledge of use of DME or AE;Impaired sensation;Impaired UE functional use      OT Treatment/Interventions: Self-care/ADL training;Therapeutic exercise;Therapeutic activities;Patient/family education;Balance training;DME and/or AE instruction;Neuromuscular education;Energy conservation;Cognitive remediation/compensation;Visual/perceptual remediation/compensation    OT Goals(Current goals can be found in the care plan section) Acute Rehab OT Goals Patient Stated Goal: Get better OT Goal Formulation: With patient Time For Goal Achievement:  11/16/21 Potential to Achieve Goals: Good  OT Frequency: Min 3X/week    Co-evaluation              AM-PAC OT "6 Clicks" Daily Activity     Outcome Measure Help from another person eating meals?: A Little Help from another person taking care of personal grooming?: A Lot Help from another person toileting, which includes using toliet, bedpan, or urinal?: A Lot Help from another person bathing (including washing, rinsing, drying)?: A Lot Help from another person to put on and taking off regular upper body clothing?: A Lot Help from another person to put on and taking off regular lower body clothing?: A Lot 6 Click Score: 13   End of Session Equipment Utilized During Treatment: Rolling walker (2 wheels);Gait belt Nurse Communication: Mobility status  Activity Tolerance: Patient tolerated treatment well Patient left: in bed;with call bell/phone within reach;with bed alarm set  OT Visit Diagnosis: Unsteadiness on feet (R26.81);Other abnormalities of gait and mobility (R26.89);Muscle weakness (generalized) (M62.81);History of falling (Z91.81);Hemiplegia and hemiparesis;Other symptoms and signs involving cognitive function Hemiplegia - Right/Left: Right Hemiplegia - dominant/non-dominant: Dominant                Time: 9379-0240 OT Time Calculation (min): 34 min Charges:  OT General Charges $OT Visit: 1 Visit OT Evaluation $OT Eval Moderate Complexity: 1 Mod OT Treatments $Self Care/Home Management : 8-22 mins  Sandra Horne, Sandra Horne [facility] Acute Rehabilitation Office: [phone]   Sandra Horne 11/02/2021, 2:11 PM

## 2021-11-02 NOTE — Progress Notes (Signed)
PROGRESS NOTE    Sandra Horne  TIW:[id number] DOB: 1955-04-15 DOA: 10/31/2021 PCP: Patient, No Pcp Per    Brief Narrative:  66 year old smoker initially diagnosed with left infrahilar lung mass on September 2021 but lost follow-up, December 2022 found to have headache weakness and confusion.  Bronchial biopsy with non-small cell carcinoma and brain MRI with brain mets.  Following up with medical oncology and radiation oncology.  Presented to the emergency room with worsening right hand weakness after recently going radiation treatment and also discontinued steroids.  CT head showed worsening vasogenic edema.  Patient transferred from [facility] to [facility] for neurology and neurosurgical input.   Assessment & Plan:   Metastatic non-small cell lung cancer, metastatic brain cancer with edema. Currently on radiation treatment.  Patient was on dexamethasone that was stopped on 8/3. Started on Decadron 4 mg every 6 hours. MRI brain with multiple brain metastasis with vasogenic edema. Discussed with oncology.  Recommended radiation oncology. Discussed with Dr. Tammi Klippel, radiation oncology who recommended to continue steroids and keep up with radiation plans. Currently hemodynamically stabilizing. Start working with PT OT today.  Steroid-induced hyperglycemia: Cover with sliding scale insulin.  Smoker: Nicotine patch.  Prognosis: Expecting her oncologist to discuss prognosis with the patient and family.  Case discussed with Dr. Earlie Server and Dr. Tammi Klippel.   DVT prophylaxis: heparin injection 5,000 Units Start: 10/31/21 2200 SCDs Start: 10/31/21 1709 Place TED hose Start: 10/31/21 1709   Code Status: Full code Family Communication: None.  Daughter unable to pick up the phone. Disposition Plan: Status is: Inpatient Remains inpatient appropriate because: IV steroids, worsening neurological status     Consultants:  Radiation oncology, message sent Oncology, message  sent Neurology, curbside.    Procedures:  None  Antimicrobials:  None   Subjective: Patient seen and examined.  Right sided weakness persist.  Patient thinks her cancer is curable.  She thinks or should try something other than radiation as her radiation is not helping or making weakness worse. I tried to elicit with patient that her cancer is not curable, radiation therapy is done to help shrinking the cancer from the brain and her lung tumor is not treated.  Patient has very poor understanding about her disease. I will consult palliative care to team to coordinate care in between patient family and oncology. Difficulty mobilizing.  Not work with PT OT yet.  Objective: Vitals:   11/01/21 2353 11/02/21 0436 11/02/21 0526 11/02/21 0800  BP: 138/69 108/76  111/62  Pulse: 81 79  72  Resp:  17 18 18   Temp: 97.8 F (36.6 C) 98 F (36.7 C)  (!) 97.5 F (36.4 C)  TempSrc: Oral Oral  Oral  SpO2: 99% 96%  98%  Weight:      Height:        Intake/Output Summary (Last 24 hours) at 11/02/2021 1319 Last data filed at 11/02/2021 0600 Gross per 24 hour  Intake 680 ml  Output 700 ml  Net -20 ml   Filed Weights   10/31/21 1433 10/31/21 1832  Weight: 64 kg 64.3 kg    Examination:  General exam: Appears calm and comfortable.  Today sitting in chair. Frail and debilitated.  Chronically sick looking. Respiratory system: No added sounds. Cardiovascular system: S1 & S2 heard, RRR. No JVD, murmurs, rubs, gallops or clicks. No pedal edema. Gastrointestinal system: Abdomen is nondistended, soft and nontender. No organomegaly or masses felt. Normal bowel sounds heard. Central nervous system: Alert and oriented.  No  cranial nerve deficits. Right upper extremity 3/5.  Right lower extremity 3/5.  Left upper and lower extremity normal.    Data Reviewed: I have personally reviewed following labs and imaging studies  CBC: Recent Labs  Lab 10/31/21 1510 10/31/21 1528 11/01/21 0500  WBC 7.1   --  5.4  NEUTROABS 4.3  --   --   HGB 9.7* 10.2* 9.9*  HCT 31.5* 30.0* 30.3*  MCV 86.5  --  82.6  PLT 671*  --  725*   Basic Metabolic Panel: Recent Labs  Lab 10/31/21 1510 10/31/21 1528 11/01/21 0500  NA 140 140 139  K 3.7 3.6 4.1  CL 106 102 109  CO2 27  --  22  GLUCOSE 135* 129* 163*  BUN 7* 4* 7*  CREATININE 0.65 0.60 0.57  CALCIUM 8.8*  --  8.9   GFR: Estimated Creatinine Clearance: 61.3 mL/min (by C-G formula based on SCr of 0.57 mg/dL). Liver Function Tests: Recent Labs  Lab 10/31/21 1510  AST 18  ALT 23  ALKPHOS 99  BILITOT 0.8  PROT 7.1  ALBUMIN 3.1*   No results for input(s): "LIPASE", "AMYLASE" in the last 168 hours. No results for input(s): "AMMONIA" in the last 168 hours. Coagulation Profile: Recent Labs  Lab 10/31/21 1510  INR 1.0   Cardiac Enzymes: No results for input(s): "CKTOTAL", "CKMB", "CKMBINDEX", "TROPONINI" in the last 168 hours. BNP (last 3 results) No results for input(s): "PROBNP" in the last 8760 hours. HbA1C: No results for input(s): "HGBA1C" in the last 72 hours. CBG: Recent Labs  Lab 11/01/21 1711 11/01/21 2139 11/02/21 0635 11/02/21 0844 11/02/21 1304  GLUCAP 151* 161* 154* 141* 135*   Lipid Profile: No results for input(s): "CHOL", "HDL", "LDLCALC", "TRIG", "CHOLHDL", "LDLDIRECT" in the last 72 hours. Thyroid Function Tests: No results for input(s): "TSH", "T4TOTAL", "FREET4", "T3FREE", "THYROIDAB" in the last 72 hours. Anemia Panel: No results for input(s): "VITAMINB12", "FOLATE", "FERRITIN", "TIBC", "IRON", "RETICCTPCT" in the last 72 hours. Sepsis Labs: No results for input(s): "PROCALCITON", "LATICACIDVEN" in the last 168 hours.  No results found for this or any previous visit (from the past 240 hour(s)).       Radiology Studies: MR BRAIN W WO CONTRAST  Result Date: 11/01/2021 CLINICAL DATA:  Metastatic lung carcinoma EXAM: MRI HEAD WITHOUT AND WITH CONTRAST TECHNIQUE: Multiplanar, multiecho pulse  sequences of the brain and surrounding structures were obtained without and with intravenous contrast. CONTRAST:  6.47mL GADAVIST GADOBUTROL 1 MMOL/ML IV SOLN COMPARISON:  10/11/2021 FINDINGS: Brain: There are 6 peripherally contrast-enhancing lesions, 4 of which have increased in size. The left occipital lesion is slightly smaller. The left paramedian frontal lobe lesion is unchanged. No acute or chronic hemorrhage. The midline structures are normal. 1. Slightly decreased size of left occipital lesion, now 8 mm series 22, image 15 2. Increased size and worsened edema of right temporal lesion, now 8 mm, image 16 3. Increased size of predominantly cystic lesion of the left parietal lobe with worsened surrounding edema, now 3.4 cm, image 33 4. Increased size of peripheral left frontal lesion, 4 mm with worsened surrounding edema. Image 33 5. Unchanged left paramedian frontal lobe lesion, 3 mm.  Image 33 6. Increased size of right frontal lesion, 6 mm, image 36 7. There are no new lesions. Vascular: Major flow voids are preserved. Skull and upper cervical spine: Normal calvarium and skull base. Visualized upper cervical spine and soft tissues are normal. Sinuses/Orbits:No paranasal sinus fluid levels or advanced mucosal thickening. No  mastoid or middle ear effusion. Normal orbits. IMPRESSION: 1. Increased size of 4 of the 6 metastatic lesions with worsened associated edema. 2. Unchanged left paramedian frontal lobe metastasis and slightly decreased size of left occipital metastasis. 3. No new lesions. Electronically Signed   By: Ulyses Jarred M.D.   On: 11/01/2021 01:02   CT HEAD WO CONTRAST  Result Date: 10/31/2021 CLINICAL DATA:  Pt is currently receiving chemo for brain cancer. Pt states she went to bed normal last night. Pt woke up at 10AM stating her face was "twisting". Weakness noted to right side. EXAM: CT HEAD WITHOUT CONTRAST TECHNIQUE: Contiguous axial images were obtained from the base of the skull  through the vertex without intravenous contrast. RADIATION DOSE REDUCTION: This exam was performed according to the departmental dose-optimization program which includes automated exposure control, adjustment of the mA and/or kV according to patient size and/or use of iterative reconstruction technique. COMPARISON:  Head CT, 03/17/2021.  Brain MRI, 10/11/2021. FINDINGS: Brain: Low-attenuation oval mass, posterior left frontal lobe, 3.3 cm. This appears increased in size compared to the recent brain MRI. There is significant surrounding hypoattenuation consistent with vasogenic edema. Small areas of hypoattenuation are noted in the anterior right frontal lobe, right temporal lobe and left occipital lobe consistent with vasogenic edema from additional lesions, similar to that seen on the prior brain MRI. There is mass effect with sulcal effacement, partial effacement of the left lateral ventricle and midline shift to the left of 9 mm. Mass effect appears similar to the prior brain MRI. No convincing infarct. No intracranial hemorrhage. No extra-axial masses or abnormal fluid collections. Vascular: No hyperdense vessel or unexpected calcification. Skull: Normal. Negative for fracture or focal lesion. Sinuses/Orbits: Visualized globes and orbits are unremarkable. Visualized sinuses are clear. Other: None. IMPRESSION: 1. Left parietal lobe mass appears mildly increased in size compared to no a recent prior brain MRI. There is a similar amount associated vasogenic edema. Vasogenic edema associated with the smaller brain masses is also similar to the prior brain MRI. Mass effect is without significant change. Findings are consistent with metastatic disease, better defined on the recent prior brain MRI. 2. No new abnormalities. No evidence of an ischemic infarct. No intracranial hemorrhage. Electronically Signed   By: Lajean Manes M.D.   On: 10/31/2021 15:37        Scheduled Meds:  aspirin EC  81 mg Oral BID    dexamethasone (DECADRON) injection  4 mg Intravenous Q6H   feeding supplement  237 mL Oral TID BM   heparin  5,000 Units Subcutaneous Q8H   insulin aspart  0-5 Units Subcutaneous QHS   insulin aspart  0-9 Units Subcutaneous TID WC   levETIRAcetam  500 mg Oral BID   methocarbamol  500 mg Oral Q8H   multivitamin with minerals  1 tablet Oral Daily   nicotine  21 mg Transdermal Daily   polyethylene glycol  17 g Oral Daily   sodium chloride flush  3 mL Intravenous Once   sodium chloride flush  3 mL Intravenous Q12H   sodium chloride flush  3 mL Intravenous Q12H   Continuous Infusions:  sodium chloride       LOS: 2 days    Time spent: 35 minutes    Barb Merino, MD [facility] Pager [phone]

## 2021-11-02 NOTE — Progress Notes (Signed)
Initial Nutrition Assessment  DOCUMENTATION CODES:   Non-severe (moderate) malnutrition in context of chronic illness  INTERVENTION:  Continue current diet, encourage PO intake Provided with menu and educated on ordering process Ensure Enlive po TID, each supplement provides 350 kcal and 20 grams of protein. MVI with minerals daily Would benefit from outpatient RD follow-up at cancer center.  NUTRITION DIAGNOSIS:   Moderate Malnutrition (in context of chronic illness) related to cancer and cancer related treatments as evidenced by moderate fat depletion, moderate muscle depletion.  GOAL:   Patient will meet greater than or equal to 90% of their needs  MONITOR:   PO intake, I & O's, Supplement acceptance  REASON FOR ASSESSMENT:   Malnutrition Screening Tool    ASSESSMENT:   Pt with hx of tobacco abuse and lung cancer with mets to the brain currently undergoing radiation and chemo, presented to ED with right sided weakness. Imaging shows growing mass in the brain.  Met with pt in room. Reports she is about to order lunch, provided with menu and discussed how to order meals. Pt endorses weight loss and that appetite has been decreased for some time. Endorses that she feels weak and lethargic on days she has her cancer treatment. Muscle and fat deficits seen on exam. Does not follow with RD at cancer but agrees it would be beneficial.  Does not drink ensure at baseline, states she likes the one she received earlier. Discussed they would be an easy way to increase kcal and protein on days when intake is poor.   Nutritionally Relevant Medications: Scheduled Meds:  dexamethasone   4 mg Intravenous Q6H   Ensure Plus High Protein  237 mL Oral BID BM   insulin aspart  0-5 Units Subcutaneous QHS   insulin aspart  0-9 Units Subcutaneous TID WC   polyethylene glycol  17 g Oral Daily   Continuous Infusions:  sodium chloride      Labs Reviewed:  NUTRITION - FOCUSED PHYSICAL  EXAM: Flowsheet Row Most Recent Value  Orbital Region Mild depletion  Upper Arm Region Moderate depletion  Thoracic and Lumbar Region Moderate depletion  Buccal Region Mild depletion  Temple Region Mild depletion  Clavicle Bone Region Moderate depletion  Clavicle and Acromion Bone Region Moderate depletion  Scapular Bone Region Moderate depletion  Dorsal Hand Mild depletion  Patellar Region Moderate depletion  Anterior Thigh Region Moderate depletion  Posterior Calf Region Moderate depletion  Edema (RD Assessment) None  Hair Reviewed  Eyes Reviewed  Mouth Reviewed  [teeth missing]  Skin Reviewed  Nails Reviewed  [clubbing]    Diet Order:   Diet Order             Diet regular Room service appropriate? Yes; Fluid consistency: Thin  Diet effective now                  EDUCATION NEEDS:  Education needs have been addressed  Skin:  Skin Assessment: Reviewed RN Assessment  Last BM:  8/4  Height:  Ht Readings from Last 1 Encounters:  10/31/21 5' 4"  (1.626 m)    Weight:  Wt Readings from Last 1 Encounters:  10/31/21 64.3 kg    Ideal Body Weight:  54.6 kg  BMI:  Body mass index is 24.33 kg/m.  Estimated Nutritional Needs:  Kcal:  1800-2000 kcal/d Protein:  90-100 g/d Fluid:  >/=2L/d  Ranell Patrick, RD, LDN Clinical Dietitian RD pager # available in [facility]  After hours/weekend pager # available in [facility]

## 2021-11-02 NOTE — Consult Note (Signed)
Consultation Note Date: 11/02/2021   Patient Name: Sandra Horne  DOB: 1955-09-06  MRN: [id number]  Age / Sex: 66 y.o., female  PCP: Patient, No Pcp Per Referring Physician: Barb Merino, MD  Reason for Consultation: Establishing goals of care  HPI/Patient Profile: 66 y.o. female  with past medical history of primary lung CA (left infrahilar mass dx Sept 2021 but lost to f/u, last radiation treatment 10/29/2021), chronic anemia, right femoral neck fracture (s/p right hemiarthroplasty on 10/13/2021) and tobacco abuse admitted on 10/31/2021 with new right-sided weakness.  Patient was following up with medical oncology and radiation oncology and receiving radiation treatments when she had worsening of right-handed weakness which prompted emergency room visit.  As per chart review, steroids were abruptly stopped.  As per patient, she thought they had been discontinued by her doctors.   CT of head at [facility] ED revealed left parietal lobe mass that appeared mildly increased in size with vasogenic edema compared to recent prior brain MRI.  Patient was transferred from [facility] on 10/31/21. Neurology, radiation oncology, and palliative medicine team have been consulted.    PMT was consulted to assist with goals of care.  Clinical Assessment and Goals of Care: I have reviewed medical records including EPIC notes, labs and imaging, assessed the patient and then met with patient at bedside to discuss diagnosis prognosis, GOC, EOL wishes, disposition and options.  I introduced Palliative Medicine as specialized medical care for people living with serious illness. It focuses on providing relief from the symptoms and stress of a serious illness. The goal is to improve quality of life for both the patient and the family.  We discussed a brief life review of the patient.  Patient lives with her daughter  [name].  She has been married for about a year and a half.  She shares she moved out of living with her husband in [place] to be with her daughter and Lady [place] so that she could get to the daily care that she needed.  She shares her husband checks in on her and helps provide care as needed.  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  She says she enjoyed this work but was ready for a break.  As far as functional and nutritional status PTA patient endorses she had a healthy appetite, was able to perform ADLs with minimal assistance from her daughter, and had no issues until she started chemotherapy "a while back".  When asked to elaborate what she meant by issues after chemotherapy, patient shared she has been experience right arm and right-sided weakness since beginning chemotherapy months ago.   I attempted to clarify patient's understanding of her current health status. She said she just wants to get "get better".  As per chart review, last chemo was received 06/02/21. I discussed patient's chemo and radiation for lung cancer were with Dr. Julien Nordmann, who had been made aware of patient's admission. I also shared that Dr. Tammi Klippel has been part  of her plan of care and is making recommendations to continue steroids and rad plans. Patient states she doesn't know Dr. Julien Nordmann or "anything about no lung cancer". She says she is just "trying to get this right side back to normal".   I further discussed patient's treatment plan which included chemo and radiation. Patient states she is going to take 2 more radiation treatments and then "that's it". When asked why patient was agreeable to 2 treatments she said that was "all I want to do right now". Patient declined to elaborate further.   Continued education on diagnosis, prognosis, and treatment plan needed.   Discussed with patient and then with family that entered towards the end of my visit the importance of continued conversation with family  and the medical providers regarding overall plan of care and treatment options, ensuring decisions are within the context of the patient's values and GOCs.   Questions and concerns were addressed. The family was encouraged to call with questions or concerns. I plan on rounding on the patient again tomorrow to continue [place] discussions.   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  Constitutional:      Appearance: Normal appearance.  HENT:     Head: Normocephalic and atraumatic.     Mouth/Throat:     Comments: Poor dentition Cardiovascular:     Rate and Rhythm: Normal rate.     Pulses: Normal pulses.  Pulmonary:     Effort: Pulmonary effort is normal.  Abdominal:     Palpations: Abdomen is soft.  Musculoskeletal:     Comments: RUE weakness  Skin:    General: Skin is warm and dry.  Neurological:     Mental Status: She is alert and oriented to person, place, and time.  Psychiatric:        Mood and Affect: Mood normal.     Palliative Assessment/Data: 50%     Thank you for this consult. Palliative medicine will continue to follow and assist holistically.   Time Total: 75 minutes Greater than 50%  of this time was spent counseling and coordinating care related to the above assessment and plan.  Signed by: [name], DNP, FNP-BC Palliative Medicine    Please contact Palliative Medicine Team phone at [phone] for questions and concerns.  For individual provider: See Shea Evans

## 2021-11-02 NOTE — Evaluation (Signed)
Physical Therapy Evaluation Patient Details Name: Sandra Horne MRN: [id number] DOB: 1955/07/07 Today's Date: 11/02/2021  History of Present Illness  66 y.o female admitted 8/6 with Metastatic non-small cell lung cancer, metastatic brain cancer with edema and pressure syndrome:  Currently on radiation treatment.  Clinical Impression  Pt admitted with above diagnosis. Demonstrates gross weakness with RUE and RLE greater weakness than Lt. Required min assist for transfer and ambulation with RW. States she will have 24/7 supervision available from daughter as needed. HHPT to progress independence after d/c recommended to reduce fall risk. Pt currently with functional limitations due to the deficits listed below (see PT Problem List). Pt will benefit from skilled PT to increase their independence and safety with mobility to allow discharge to the venue listed below.          Recommendations for follow up therapy are one component of a multi-disciplinary discharge planning process, led by the attending physician.  Recommendations may be updated based on patient status, additional functional criteria and insurance authorization.  Follow Up Recommendations Home health PT Can patient physically be transported by private vehicle: Yes    Assistance Recommended at Discharge Frequent or constant Supervision/Assistance  Patient can return home with the following  A little help with walking and/or transfers;Help with stairs or ramp for entrance;Assist for transportation;Assistance with cooking/housework;A little help with bathing/dressing/bathroom    Equipment Recommendations None recommended by PT  Recommendations for Other Services  OT consult    Functional Status Assessment Patient has had a recent decline in their functional status and demonstrates the ability to make significant improvements in function in a reasonable and predictable amount of time.     Precautions / Restrictions  Precautions Precautions: Fall Restrictions Weight Bearing Restrictions: No (Simultaneous filing. User may not have seen previous data.) RLE Weight Bearing: Weight bearing as tolerated      Mobility  Bed Mobility Overal bed mobility: Needs Assistance Bed Mobility: Supine to Sit     Supine to sit: Supervision     General bed mobility comments: Slower, requires extra time, cues to assist with doffing blankets.    Transfers Overall transfer level: Needs assistance Equipment used: Rolling walker (2 wheels) Transfers: Sit to/from Stand Sit to Stand: Min assist           General transfer comment: Min assist for boost to stand. Cues for hand placement with sit<>stand transitions - able to sit with good control. Uses RW appropriately to steady herself cues to widen BOS when standing.    Ambulation/Gait Ambulation/Gait assistance: Min assist Gait Distance (Feet): 80 Feet Assistive device: Rolling walker (2 wheels) Gait Pattern/deviations: Step-to pattern, Decreased step length - right, Decreased stride length, Decreased dorsiflexion - right, Narrow base of support, Drifts right/left Gait velocity: decreased Gait velocity interpretation: <1.31 ft/sec, indicative of household ambulator   General Gait Details: Educated on safe AD use with RW, cues for foot placement and symmetry, avoiding narrow BOS. Drifts Right, able to correct with VC and intermittent carry over. No buckling during bout. Min assist for RW control initially, progressed to min guard second half of distance for safety.  Stairs            Wheelchair Mobility    Modified Rankin (Stroke Patients Only)       Balance Overall balance assessment: Needs assistance Sitting-balance support: No upper extremity supported, Feet supported Sitting balance-Leahy Scale: Good     Standing balance support: No upper extremity supported, During functional activity Standing balance-Leahy Scale:  Fair                                Pertinent Vitals/Pain Pain Assessment Pain Assessment: No/denies pain    Home Living Family/patient expects to be discharged to:: Private residence Living Arrangements: Children (daughter - victoria) Available Help at Discharge: Family;Available 24 hours/day Type of Home: House Home Access: Stairs to enter Entrance Stairs-Rails: None Entrance Stairs-Number of Steps: 1   Home Layout: One level Home Equipment: Conservation officer, nature (2 wheels);BSC/3in1;Wheelchair - manual;Hospital bed;Grab bars - toilet      Prior Function Prior Level of Function : Independent/Modified Independent;History of Falls (last six months)             Mobility Comments: States she quit driving 1 month ago. Has been using RW to ambulate at home since. ADLs Comments: Independent     Hand Dominance   Dominant Hand: Right    Extremity/Trunk Assessment   Upper Extremity Assessment Upper Extremity Assessment: Defer to OT evaluation (Obvious weakness RUE)    Lower Extremity Assessment Lower Extremity Assessment: RLE deficits/detail;Generalized weakness RLE Deficits / Details: Gross weakness BIL with MMT, RLE weaker than left however low effort with both not consistent with functional mobility; ankle DF 3/5, knee extension 3/5,  knee flexion 4/5, Hip flexion 3-/5, hip abduction 3+/5; LLE grossly 4/5 RLE Sensation: decreased light touch (face, RUE, and RLE reported with light touch) RLE Coordination: decreased fine motor;decreased gross motor    Cervical / Trunk Assessment Cervical / Trunk Assessment: Normal  Communication   Communication: No difficulties  Cognition Arousal/Alertness: Awake/alert Behavior During Therapy: WFL for tasks assessed/performed Overall Cognitive Status: Impaired/Different from baseline Area of Impairment: Orientation, Following commands, Problem solving, Memory                 Orientation Level: Disoriented to, Time (5366, stated several times)    Memory: Decreased short-term memory Following Commands: Follows one step commands consistently     Problem Solving: Slow processing          General Comments      Exercises     Assessment/Plan    PT Assessment Patient needs continued PT services  PT Problem List Decreased strength;Decreased activity tolerance;Decreased balance;Decreased mobility;Decreased range of motion;Decreased coordination;Decreased knowledge of use of DME;Impaired sensation       PT Treatment Interventions DME instruction;Gait training;Stair training;Functional mobility training;Therapeutic activities;Therapeutic exercise;Balance training;Neuromuscular re-education;Cognitive remediation;Patient/family education    PT Goals (Current goals can be found in the Care Plan section)  Acute Rehab PT Goals Patient Stated Goal: return home PT Goal Formulation: With patient Time For Goal Achievement: 11/09/21 Potential to Achieve Goals: Good    Frequency Min 4X/week     Co-evaluation               AM-PAC PT "6 Clicks" Mobility  Outcome Measure Help needed turning from your back to your side while in a flat bed without using bedrails?: None Help needed moving from lying on your back to sitting on the side of a flat bed without using bedrails?: A Little Help needed moving to and from a bed to a chair (including a wheelchair)?: A Little Help needed standing up from a chair using your arms (e.g., wheelchair or bedside chair)?: A Little Help needed to walk in hospital room?: A Little Help needed climbing 3-5 steps with a railing? : A Little 6 Click Score: 19    End of Session Equipment  Utilized During Treatment: Gait belt Activity Tolerance: Patient tolerated treatment well Patient left: in chair;with call bell/phone within reach;with chair alarm set   PT Visit Diagnosis: Other abnormalities of gait and mobility (R26.89);Hemiplegia and hemiparesis;Difficulty in walking, not elsewhere classified  (R26.2);History of falling (Z91.81) Hemiplegia - Right/Left: Right Hemiplegia - dominant/non-dominant: Dominant Hemiplegia - caused by:  (vasogenic edema, brain mets)    Time: 9191-6606 PT Time Calculation (min) (ACUTE ONLY): 31 min   Charges:   PT Evaluation $PT Eval Low Complexity: 1 Low PT Treatments $Gait Training: 8-22 mins        Candie Mile, PT   Ellouise Newer 11/02/2021, 9:20 AM

## 2021-11-02 NOTE — Progress Notes (Signed)
   11/02/21 0930  Clinical Encounter Type  Visited With Patient  Visit Type Initial;Spiritual support  Referral From Nurse  Consult/Referral To Chaplain   Chaplain responded to a spiritual consult request for prayer.  The patient, Esha, stated she was feeling so much better today and was relieved because of that.  She shared that she does have family supporting her and is thankful. Lorijean requested prayer for soundness of mind and strength. As she looks forward to going home it is important that [place] continue to live and be as active as possible.   Danice Goltz  [facility]  [phone]

## 2021-11-03 DIAGNOSIS — S72001A Fracture of unspecified part of neck of right femur, initial encounter for closed fracture: Secondary | ICD-10-CM | POA: Diagnosis not present

## 2021-11-03 DIAGNOSIS — E440 Moderate protein-calorie malnutrition: Secondary | ICD-10-CM | POA: Diagnosis not present

## 2021-11-03 DIAGNOSIS — C3490 Malignant neoplasm of unspecified part of unspecified bronchus or lung: Secondary | ICD-10-CM | POA: Diagnosis not present

## 2021-11-03 DIAGNOSIS — R531 Weakness: Secondary | ICD-10-CM | POA: Diagnosis not present

## 2021-11-03 DIAGNOSIS — Z720 Tobacco use: Secondary | ICD-10-CM | POA: Diagnosis not present

## 2021-11-03 LAB — GLUCOSE, CAPILLARY: Glucose-Capillary: 161 mg/dL — ABNORMAL HIGH (ref 70–99)

## 2021-11-03 MED ORDER — DEXAMETHASONE 4 MG PO TABS: ORAL_TABLET | ORAL | 0 refills | Status: DC

## 2021-11-03 MED ORDER — LEVETIRACETAM 500 MG PO TABS
500.0000 mg | ORAL_TABLET | Freq: Two times a day (BID) | ORAL | 2 refills | Status: DC
Start: 2021-11-03 — End: 2022-05-12

## 2021-11-03 NOTE — TOC Transition Note (Signed)
Transition of Care [facility]) - CM/SW Discharge Note   Patient Details  Name: RHIAN ASEBEDO MRN: [id number] Date of Birth: May 20, 1955  [facility] CM/SW Contact:  Pollie Friar, RN Phone Number: 11/03/2021, 1:57 PM   Clinical Narrative:    Pt discharging home to her daughters. She will have home health services through [facility] and palliative care through [place]. Information on the AVS.  Tub bench and 3 in 1 ordered through [facility] for home. Pt requires a 3 in 1 as she has difficulty making it in time to the restroom at home.  Pts family to provide transport home.   Final next level of care: Home w Home Health Services Barriers to Discharge: No Barriers Identified   Patient Goals and CMS Choice   CMS Medicare.gov Compare Post Acute Care list provided to:: Patient Choice offered to / list presented to : Patient  Discharge Placement                       Discharge Plan and Services   Discharge Planning Services: CM Consult Post Acute Care Choice: Home Health                    HH Arranged: PT, OT [facility] Agency: [place] [facility]) Date [place]: 11/03/21   Representative spoke with at [place]: [place] Interventions     Readmission Risk Interventions    10/14/2021   10:00 AM  Readmission Risk Prevention Plan  Transportation Screening Complete  PCP or Specialist Appt within 5-7 Days Not Complete  Home Care Screening Complete  Medication Review (RN CM) Complete
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
## 2021-11-03 NOTE — Progress Notes (Signed)
Discharge instructions have been given and explained to patient.  Peripheral IV has been removed.  Patient verbalizes understanding and has no questions at this time.

## 2021-11-03 NOTE — Progress Notes (Signed)
Physical Therapy Treatment Patient Details Name: Sandra Horne MRN: [id number] DOB: [birth year] Today's Date: 11/03/2021   History of Present Illness 66 y.o female admitted 8/6 with Metastatic non-small cell lung cancer, metastatic brain cancer with edema and pressure syndrome:  Currently on radiation treatment.    PT Comments    Continues to make progress towards acute rehab goals. Ambulating with improved RW control, working on symmetry of gait today. Performed LE exercises and tolerated well. Pt reports subjectively feeling better overall. Was a bit more fatigued with ambulatory distance today. Patient will continue to benefit from skilled physical therapy services to further improve independence with functional mobility.    Recommendations for follow up therapy are one component of a multi-disciplinary discharge planning process, led by the attending physician.  Recommendations may be updated based on patient status, additional functional criteria and insurance authorization.  Follow Up Recommendations  Home health PT Can patient physically be transported by private vehicle: Yes   Assistance Recommended at Discharge Frequent or constant Supervision/Assistance  Patient can return home with the following A little help with walking and/or transfers;Help with stairs or ramp for entrance;Assist for transportation;Assistance with cooking/housework;A little help with bathing/dressing/bathroom   Equipment Recommendations  None recommended by PT    Recommendations for Other Services OT consult     Precautions / Restrictions Precautions Precautions: Fall Restrictions Weight Bearing Restrictions: No RLE Weight Bearing: Weight bearing as tolerated     Mobility  Bed Mobility               General bed mobility comments: In recliner    Transfers Overall transfer level: Needs assistance Equipment used: Rolling walker (2 wheels) Transfers: Sit to/from Stand Sit to Stand: Min  assist           General transfer comment: Very light assist to boost from recliner. Required a couple of attempts without assist before therapist intervined. Cues for technique    Ambulation/Gait Ambulation/Gait assistance: Min guard Gait Distance (Feet): 65 Feet Assistive device: Rolling walker (2 wheels) Gait Pattern/deviations: Step-to pattern, Decreased step length - right, Decreased stride length, Decreased dorsiflexion - right, Narrow base of support, Drifts right/left Gait velocity: decreased Gait velocity interpretation: <1.31 ft/sec, indicative of household ambulator   General Gait Details: Frequent cues to widen BOS, increase step length on Rt, and align with RW. Some brief carry over but not sustained. Min guard for safety throughout. Pt did not tolerate further distance today but did show improved RW control and stability.   Stairs             Wheelchair Mobility    Modified Rankin (Stroke Patients Only)       Balance Overall balance assessment: Needs assistance Sitting-balance support: No upper extremity supported, Feet supported Sitting balance-Leahy Scale: Good     Standing balance support: Bilateral upper extremity supported, During functional activity Standing balance-Leahy Scale: Poor Standing balance comment: Reliant on RW                            Cognition Arousal/Alertness: Awake/alert Behavior During Therapy: WFL for tasks assessed/performed Overall Cognitive Status: Impaired/Different from baseline Area of Impairment: Attention, Memory, Following commands, Problem solving                 Orientation Level:  (1923, stated several times) Current Attention Level: Sustained Memory: Decreased short-term memory Following Commands: Follows one step commands consistently, Follows one step commands with increased time, Follows  multi-step commands inconsistently     Problem Solving: Slow processing, Requires verbal cues,  Difficulty sequencing General Comments: Intermittent VC throughout for instructions and recall. Slower processing        Exercises General Exercises - Lower Extremity Ankle Circles/Pumps: AROM, Both, 10 reps, Seated Gluteal Sets: Strengthening, Both, 10 reps, Seated Long Arc Quad: Strengthening, Both, 15 reps, Seated Hip ABduction/ADduction: Strengthening, Both, 10 reps, Seated    General Comments        Pertinent Vitals/Pain Pain Assessment Pain Assessment: No/denies pain Pain Intervention(s): Monitored during session, Repositioned    Home Living                          Prior Function            PT Goals (current goals can now be found in the care plan section) Acute Rehab PT Goals Patient Stated Goal: return home PT Goal Formulation: With patient Time For Goal Achievement: 11/09/21 Potential to Achieve Goals: Good Progress towards PT goals: Progressing toward goals    Frequency    Min 4X/week      PT Plan Current plan remains appropriate    Co-evaluation              AM-PAC PT "6 Clicks" Mobility   Outcome Measure  Help needed turning from your back to your side while in a flat bed without using bedrails?: None Help needed moving from lying on your back to sitting on the side of a flat bed without using bedrails?: A Little Help needed moving to and from a bed to a chair (including a wheelchair)?: A Little Help needed standing up from a chair using your arms (e.g., wheelchair or bedside chair)?: A Little Help needed to walk in hospital room?: A Little Help needed climbing 3-5 steps with a railing? : A Little 6 Click Score: 19    End of Session Equipment Utilized During Treatment: Gait belt Activity Tolerance: Patient tolerated treatment well Patient left: in chair;with call bell/phone within reach;with chair alarm set   PT Visit Diagnosis: Other abnormalities of gait and mobility (R26.89);Hemiplegia and hemiparesis;Difficulty in  walking, not elsewhere classified (R26.2);History of falling (Z91.81) Hemiplegia - Right/Left: Right Hemiplegia - dominant/non-dominant: Dominant Hemiplegia - caused by:  (vasogenic edema, brain mets)     Time: 1212-1226 PT Time Calculation (min) (ACUTE ONLY): 14 min  Charges:  $Gait Training: 8-22 mins                     Candie Mile, PT    Sandra Horne 11/03/2021, 1:33 PM

## 2021-11-03 NOTE — Plan of Care (Signed)
  Problem: Clinical Measurements: Goal: Ability to maintain clinical measurements within normal limits will improve Outcome: Progressing Goal: Will remain free from infection Outcome: Progressing Goal: Diagnostic test results will improve Outcome: Progressing Goal: Respiratory complications will improve Outcome: Progressing Goal: Cardiovascular complication will be avoided Outcome: Progressing   Problem: Education: Goal: Knowledge of General Education information will improve Description: Including pain rating scale, medication(s)/side effects and non-pharmacologic comfort measures Outcome: Progressing   Problem: Health Behavior/Discharge Planning: Goal: Ability to manage health-related needs will improve Outcome: Progressing

## 2021-11-03 NOTE — Progress Notes (Signed)
     Chart reviewed and updates received from RN. Patient assessed and is able to engage appropriately in discussions. She would like her family to be involved in [place] discussions as well.   I spoke with [name] over the phone. [place] meeting scheduled for today, 8/8 @ 1pm. Family is aware we will meet at patient's bedside. RN notified as well.  Detailed note and recommendations to follow once GOC has been completed.   Thank you for your referral and allowing PMT to assist in Mrs. Sandra Horne's care.   [name], FNP-BC Palliative Medicine Team  Phone: [phone]  NO CHARGE

## 2021-11-03 NOTE — Progress Notes (Signed)
Palliative Care Progress Note, Assessment & Plan   Patient Name: Sandra Horne       Date: 11/03/2021 DOB: Sep 01, 1955  Age: 66 y.o. MRN#: [id number] Attending Physician: Barb Merino, MD Primary Care Physician: Patient, No Pcp Per Admit Date: 10/31/2021  Reason for Consultation/Follow-up: Establishing goals of care  Subjective: Patient is sitting in bed in no apparent distress.  She acknowledges my presence and is able to make her wishes known.  No family at bedside.  Patient has no acute complaints at this time.  HPI: 66 y.o. female  with past medical history of primary lung CA (left infrahilar mass dx Sept 2021 but lost to f/u, last radiation treatment 10/29/2021), chronic anemia, right femoral neck fracture (s/p right hemiarthroplasty on 10/13/2021) and tobacco abuse admitted on 10/31/2021 with new right-sided weakness.  Patient was following up with medical oncology and radiation oncology and receiving radiation treatments when she had worsening of right-handed weakness which prompted emergency room visit.  As per chart review, steroids were abruptly stopped.  As per patient, she thought they had been discontinued by her doctors.    CT of head at [facility] ED revealed left parietal lobe mass that appeared mildly increased in size with vasogenic edema compared to recent prior brain MRI.  Patient was transferred from [facility] to [facility] on 10/31/21. Neurology, radiation oncology, and palliative medicine team have been consulted.    PMT was consulted to assist with goals of care.  Summary of counseling/coordination of care: After reviewing the patient's chart and assessing the patient at bedside, I spoke with patient regards to plan of care.  I shared I spoke with patient's daughter Sandra Horne over  the phone and we plan to meet at bedside today at 1 PM to discuss goals of care.  I attempted to elicit goals and wishes important to the patient.  Patient shares she does not want to continue with chemotherapy or immunotherapy.  She is questioning whether or not she wants to continue with radiation, and wants to speak with her family today in regards to this decision.  The difference between medical treatment and comfort care was discussed.  Briefly reviewed continuing with medical treatment with radiation versus patient returning home with aggressive symptom management.  Reviewed that stopping medical treatment does not mean patient would need to suffer.  Reviewed symptoms could be aggressively managed and focus could be on relief from pain/discomfort. Quality of life versus quantity of life discussed.    When asked what is important to her, patient shares "comfort is exactly what I want.  That sounds good".  However, patient is still on the fence as to whether or not she would like to continue with radiation.  Outpatient palliative services discussed as an option for treating the treatable while also supporting patient's symptom management. Patient shares her family will support her decisions and wants to make sure they hear here wishes today.   The above will be discussed with patient's family today at 1 PM.  Addendum:  I met with patient, her daughter Sandra Horne, and her 2 granddaughters at bedside today.  Discussed current health situation.  Reviewed options for continuing with radiation treatments.  Also  discussed option of avoiding any further aggressive medical treatment and focusing on comfort.  Hospice philosophy and services discussed.  Family asked appropriate questions.  Daughter and patient were in agreement that they would like to enact hospice benefits at some point in the near future.  However, since patient is unclear on whether or not she wants to continue with last 2 radiation  treatments, patient does not want to enact hospice benefits at this time.  Family is excepting of outpatient palliative services to follow at discharge.  Attending and TOC made aware of above discussions.  Palliative medicine team will continue to follow patient throughout her hospitalization.  PMT is available at patient/family's request, if goals change, or if patient health status declines.  Code Status: Full code  Prognosis: Unable to determine  Discharge Planning: Home with Palliative Services  Physical Exam Vitals reviewed.  Constitutional:      Appearance: Normal appearance.  HENT:     Head: Normocephalic.     Mouth/Throat:     Mouth: Mucous membranes are moist.     Comments: Mild facial dropping on right side - improved from yesterday Cardiovascular:     Rate and Rhythm: Normal rate.     Pulses: Normal pulses.  Pulmonary:     Effort: Pulmonary effort is normal.  Abdominal:     Palpations: Abdomen is soft.  Musculoskeletal:     Comments: Right sided UE weakness, though patient have better grip and ROM than yesterday  Skin:    General: Skin is warm and dry.  Neurological:     Mental Status: She is alert and oriented to person, place, and time.  Psychiatric:        Mood and Affect: Mood normal.        Behavior: Behavior normal.        Thought Content: Thought content normal.        Judgment: Judgment normal.             Palliative Assessment/Data: 60%    Total Time 50 minutes  Greater than 50%  of this time was spent counseling and coordinating care related to the above assessment and plan.  Thank you for allowing the Palliative Medicine Team to assist in the care of this patient.  [place] Ilsa Iha, FNP-BC Palliative Medicine Team Team Phone # [phone]

## 2021-11-04 ENCOUNTER — Ambulatory Visit: Payer: Medicare Other | Admitting: Radiation Oncology

## 2021-11-05 ENCOUNTER — Telehealth: Payer: Self-pay | Admitting: Radiation Therapy

## 2021-11-05 ENCOUNTER — Ambulatory Visit: Payer: Medicare Other | Admitting: Radiation Oncology

## 2021-11-05 NOTE — Telephone Encounter (Signed)
[name] had not arrived for her radiation treatment, so I called to see if there was an issue with her transportation today. Her daughter answered the phone and said that Sandra Horne was very weak and unable to walk out to the car on her own. They have a wheelchair but it is in [place] at her husband's house. [name] has been staying with her daughter in [place] to make it easier for her to get back and forth to her appointments. Her husband plans to bring the wheelchair to her daughter's home before the next scheduled treatment appointment on Monday 8/14.  I have reached out to the transportation facilitator, Lambert Keto [phone], he has changed the appointment and pick up info for Monday 8/14 and Wed 8/16 to accommodate wheelchair assistance.   Mont Dutton R.T.(R)(T) Radiation Special Procedures Navigator

## 2021-11-06 ENCOUNTER — Inpatient Hospital Stay: Payer: Medicaid Other | Attending: Physician Assistant | Admitting: Nurse Practitioner

## 2021-11-06 ENCOUNTER — Ambulatory Visit: Payer: Medicare Other | Admitting: Radiation Oncology

## 2021-11-06 ENCOUNTER — Encounter: Payer: Self-pay | Admitting: Nurse Practitioner

## 2021-11-06 DIAGNOSIS — C3432 Malignant neoplasm of lower lobe, left bronchus or lung: Secondary | ICD-10-CM | POA: Diagnosis not present

## 2021-11-06 DIAGNOSIS — F17210 Nicotine dependence, cigarettes, uncomplicated: Secondary | ICD-10-CM

## 2021-11-06 DIAGNOSIS — Z7189 Other specified counseling: Secondary | ICD-10-CM

## 2021-11-06 DIAGNOSIS — Z515 Encounter for palliative care: Secondary | ICD-10-CM

## 2021-11-06 DIAGNOSIS — R630 Anorexia: Secondary | ICD-10-CM | POA: Diagnosis not present

## 2021-11-06 DIAGNOSIS — C3490 Malignant neoplasm of unspecified part of unspecified bronchus or lung: Secondary | ICD-10-CM

## 2021-11-06 DIAGNOSIS — C7931 Secondary malignant neoplasm of brain: Secondary | ICD-10-CM

## 2021-11-06 DIAGNOSIS — R530 Neoplastic (malignant) related fatigue: Secondary | ICD-10-CM

## 2021-11-06 NOTE — Progress Notes (Signed)
Sandra Horne  [phone] [phone]   Name: Sandra Horne Date: 11/06/2021 MRN: [id number]  DOB: 12-Apr-1955  Patient Care Team: Patient, No Pcp Per as PCP - General (General Practice)   I connected with Sandra Horne on 11/06/21 at 11:00 AM EDT by phone and verified that I am speaking with the correct person using two identifiers.   I discussed the limitations, risks, security and privacy concerns of performing an evaluation and management service by telemedicine and the availability of in-person appointments. I also discussed with the patient that there may be a patient responsible charge related to this service. The patient expressed understanding and agreed to proceed.   Other persons participating in the visit and their role in the encounter: Sandra Horne (husband) and Maygan, RN.    Patient's location: Home   Provider's location: [place]: Sandra Horne is a 66 y.o. female with medical history including primary lung CA (left infrahilar mass dx Sept 2021 but lost to f/u, last radiation treatment 10/29/2021), chronic anemia, right femoral neck fracture (s/p right hemiarthroplasty on 10/13/2021) and tobacco abuse. She was recently admitted on 10/31/2021 with new right-sided weakness. CT of head revealed left parietal lobe mass with vasogenic edema. She is currently undergoing SRS.  Palliative ask to see for symptom management and goals of care.    SOCIAL HISTORY:     reports that she has been smoking cigarettes. She has a 10.00 pack-year smoking history. She has never used smokeless tobacco. She reports that she does not currently use alcohol. She reports that she does not currently use drugs after having used the following drugs: Marijuana.  ADVANCE DIRECTIVES:    CODE STATUS:   PAST MEDICAL HISTORY: Past Medical History:  Diagnosis Date   Cancer ([place])    Lung and Brain mets     PAST SURGICAL HISTORY:  Past Surgical History:  Procedure Laterality Date   BRONCHIAL BRUSHINGS  03/26/2021   Procedure: BRONCHIAL BRUSHINGS;  Surgeon: Margaretha Seeds, MD;  Location: [facility];  Service: Cardiopulmonary;;   BRONCHIAL NEEDLE ASPIRATION BIOPSY  03/26/2021   Procedure: BRONCHIAL NEEDLE ASPIRATION BIOPSIES;  Surgeon: Margaretha Seeds, MD;  Location: [facility];  Service: Cardiopulmonary;;   BRONCHIAL WASHINGS  03/26/2021   Procedure: BRONCHIAL WASHINGS;  Surgeon: Margaretha Seeds, MD;  Location: [facility];  Service: Cardiopulmonary;;   ECTOPIC PREGNANCY SURGERY     ENDOBRONCHIAL ULTRASOUND Bilateral 03/26/2021   Procedure: ENDOBRONCHIAL ULTRASOUND;  Surgeon: Margaretha Seeds, MD;  Location: [facility];  Service: Cardiopulmonary;  Laterality: Bilateral;   HEMOSTASIS CONTROL  03/26/2021   Procedure: HEMOSTASIS CONTROL;  Surgeon: Margaretha Seeds, MD;  Location: [facility];  Service: Cardiopulmonary;;   HIP ARTHROPLASTY Right 10/13/2021   Procedure: ARTHROPLASTY BIPOLAR HIP (HEMIARTHROPLASTY);  Surgeon: Carole Civil, MD;  Location: [facility];  Service: Orthopedics;  Laterality: Right;    HEMATOLOGY/ONCOLOGY HISTORY:  Oncology History  Primary cancer of left lower lobe of lung ([place])  01/14/2020 Initial Diagnosis   Primary cancer of left lower lobe of lung ([place])   06/02/2021 Cancer Staging   Staging form: Lung, AJCC 8th Edition - Clinical: Stage IVA (cT3, cN2, cM1b) - Signed by Curt Bears, MD on 06/02/2021   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  MEDICATIONS:  Current Outpatient Medications  Medication Sig Dispense Refill   aspirin EC 81 MG tablet Take 1 tablet (81 mg total) by mouth  2 (two) times daily for 28 days. Swallow whole. (Patient taking differently: Take 81 mg by mouth daily. Swallow whole.) 56 tablet 0   dexamethasone (DECADRON) 4 MG tablet 1 tab three times daily for 7 days 1 tab two times daily for 3 days 1 tab daily to continue 30 tablet 0   levETIRAcetam (KEPPRA) 500 MG tablet Take 1 tablet (500 mg total) by mouth 2 (two) times daily. 60 tablet 2   oxyCODONE (OXY IR/ROXICODONE) 5 MG immediate release tablet Take 1 tablet (5 mg total) by mouth every 4 (four) hours as needed for moderate pain (pain score 4-6). 20 tablet 0   polyethylene glycol (MIRALAX / GLYCOLAX) 17 g packet Take 17 g by mouth daily. 14 each 0   No current facility-administered medications for this visit.    VITAL SIGNS: There were no vitals taken for this visit. There were no vitals filed for this visit.  Estimated body mass index is 24.33 kg/m as calculated from the following:   Height as of 10/31/21: 5\' 4"  (1.626 m).   Weight as of 10/31/21: 141 lb 12.1 oz (64.3 kg).  LABS: CBC:    Component Value Date/Time   WBC 5.4 11/01/2021 0500   HGB 9.9 (L) 11/01/2021 0500   HGB 10.4 (L) 06/02/2021 1342   HCT 30.3 (L) 11/01/2021 0500   PLT 621 (H) 11/01/2021 0500   PLT 337 06/02/2021 1342   MCV 82.6 11/01/2021 0500   NEUTROABS 4.3 10/31/2021 1510   LYMPHSABS 1.7 10/31/2021 1510   MONOABS 0.8 10/31/2021 1510   EOSABS 0.2 10/31/2021 1510   BASOSABS 0.1 10/31/2021 1510   Comprehensive Metabolic Panel:    Component Value Date/Time   NA 139 11/01/2021 0500   K 4.1 11/01/2021 0500   CL 109 11/01/2021 0500   CO2 22 11/01/2021 0500   BUN 7 (L) 11/01/2021 0500   CREATININE 0.57 11/01/2021 0500   CREATININE 0.71 10/05/2021 1027   GLUCOSE 163 (H) 11/01/2021 0500   CALCIUM 8.9 11/01/2021 0500   AST 18 10/31/2021 1510   AST 17 06/02/2021 1342   ALT 23 10/31/2021 1510   ALT 25 06/02/2021 1342   ALKPHOS 99 10/31/2021 1510   BILITOT 0.8 10/31/2021 1510   BILITOT 0.5 06/02/2021 1342   PROT  7.1 10/31/2021 1510   ALBUMIN 3.1 (L) 10/31/2021 1510    RADIOGRAPHIC STUDIES: MR BRAIN W WO CONTRAST  Result Date: 11/01/2021 CLINICAL DATA:  Metastatic lung carcinoma EXAM: MRI HEAD WITHOUT AND WITH CONTRAST TECHNIQUE: Multiplanar, multiecho pulse sequences of the brain and surrounding structures were obtained without and with intravenous contrast. CONTRAST:  6.60mL GADAVIST GADOBUTROL 1 MMOL/ML IV SOLN COMPARISON:  10/11/2021 FINDINGS: Brain: There are 6 peripherally contrast-enhancing lesions, 4 of which have increased in size. The left occipital lesion is slightly smaller. The left paramedian frontal lobe lesion is unchanged. No acute or chronic hemorrhage. The midline structures are normal. 1. Slightly decreased size of left occipital lesion, now 8 mm series 22, image 15 2. Increased size and worsened edema of right temporal lesion, now 8 mm, image 16 3. Increased size of predominantly cystic lesion of the left parietal lobe with worsened surrounding edema, now 3.4 cm, image 33  4. Increased size of peripheral left frontal lesion, 4 mm with worsened surrounding edema. Image 33 5. Unchanged left paramedian frontal lobe lesion, 3 mm.  Image 33 6. Increased size of right frontal lesion, 6 mm, image 36 7. There are no new lesions. Vascular: Major flow voids are preserved. Skull and upper cervical spine: Normal calvarium and skull base. Visualized upper cervical spine and soft tissues are normal. Sinuses/Orbits:No paranasal sinus fluid levels or advanced mucosal thickening. No mastoid or middle ear effusion. Normal orbits. IMPRESSION: 1. Increased size of 4 of the 6 metastatic lesions with worsened associated edema. 2. Unchanged left paramedian frontal lobe metastasis and slightly decreased size of left occipital metastasis. 3. No new lesions. Electronically Signed   By: Ulyses Jarred M.D.   On: 11/01/2021 01:02   CT HEAD WO CONTRAST  Result Date: 10/31/2021 CLINICAL DATA:  Pt is currently receiving chemo  for brain cancer. Pt states she went to bed normal last night. Pt woke up at 10AM stating her face was "twisting". Weakness noted to right side. EXAM: CT HEAD WITHOUT CONTRAST TECHNIQUE: Contiguous axial images were obtained from the base of the skull through the vertex without intravenous contrast. RADIATION DOSE REDUCTION: This exam was performed according to the departmental dose-optimization program which includes automated exposure control, adjustment of the mA and/or kV according to patient size and/or use of iterative reconstruction technique. COMPARISON:  Head CT, 03/17/2021.  Brain MRI, 10/11/2021. FINDINGS: Brain: Low-attenuation oval mass, posterior left frontal lobe, 3.3 cm. This appears increased in size compared to the recent brain MRI. There is significant surrounding hypoattenuation consistent with vasogenic edema. Small areas of hypoattenuation are noted in the anterior right frontal lobe, right temporal lobe and left occipital lobe consistent with vasogenic edema from additional lesions, similar to that seen on the prior brain MRI. There is mass effect with sulcal effacement, partial effacement of the left lateral ventricle and midline shift to the left of 9 mm. Mass effect appears similar to the prior brain MRI. No convincing infarct. No intracranial hemorrhage. No extra-axial masses or abnormal fluid collections. Vascular: No hyperdense vessel or unexpected calcification. Skull: Normal. Negative for fracture or focal lesion. Sinuses/Orbits: Visualized globes and orbits are unremarkable. Visualized sinuses are clear. Other: None. IMPRESSION: 1. Left parietal lobe mass appears mildly increased in size compared to no a recent prior brain MRI. There is a similar amount associated vasogenic edema. Vasogenic edema associated with the smaller brain masses is also similar to the prior brain MRI. Mass effect is without significant change. Findings are consistent with metastatic disease, better defined on  the recent prior brain MRI. 2. No new abnormalities. No evidence of an ischemic infarct. No intracranial hemorrhage. Electronically Signed   By: Lajean Manes M.D.   On: 10/31/2021 15:37    PERFORMANCE STATUS (ECOG) : 1 - Symptomatic but completely ambulatory  Review of Systems  Constitutional:  Positive for appetite change.  Neurological:  Positive for weakness.  Unless otherwise noted, a complete review of systems is negative.  IMPRESSION: I connected with [name] by phone for initial visit to establish outpatient palliative support.  Her husband [name] was also present during discussion.  Patient did not identify any acute distress.  Able to engage appropriately in discussions.  I introduced myself, Maygan RN, and Palliative's role in collaboration with the oncology team. Concept of Palliative Care was introduced as specialized medical care for people and their families living with serious illness.  It focuses on providing relief from  the symptoms and stress of a serious illness.  The goal is to improve quality of life for both the patient and the family. Values and goals of care important to patient and family were attempted to be elicited.   [name] is currently living in the home with her daughter due to her recent health decline.  She and her husband have been married since May 2022.  She has 2 children.  She worked for more than 20 years in the [employer] ([employer]).  She is ambulatory around the home with a walker due to recent hip surgery s/p fall.  Able to perform most ADLs independently.  Endorses ongoing fatigue and decreased appetite.  [name] denies nausea, vomiting, diarrhea, or constipation.  She endorses occasional aches and pains however this is controlled with Tylenol or ibuprofen.  Decreased appetite Patient reports her appetite has been minimal over the past several weeks.  She averages 1-2 meals daily.  She is making efforts to drink Ensure at least  3 times a day.  Denies any nausea and vomiting.  Confirms fatigue and decreased appetite overall.  Education provided on continued use of Ensure for additional protein.  Goals of care  We discussed her current illness and what it means in the larger context of Her on-going co-morbidities. Natural disease trajectory and expectations were discussed.  Patient is realistic in her understanding of her current illness and disease trajectory.  She is clear and expressed wishes to continue with her current radiation treatment.  She has been in communication with outpatient palliative and hospice ([facility]  She confirms she is not ready to accept hospice services at this time however is aware that this will be most appropriate in the near future.  Education provided on goals and philosophy of palliative and hospice care.  Patient and husband verbalized understanding and appreciation.  I discussed the importance of continued conversation with family and their medical providers regarding overall plan of care and treatment options, ensuring decisions are within the context of the patients values and GOCs.  PLAN: Establish therapeutic relationship.  Education provided on palliative's role in collaboration with her radiation team  No symptom management needs at this time  Ongoing goals of care discussions  I will plan to see patient back next week in collaboration to other oncology appointments.    Patient expressed understanding and was in agreement with this plan. She also understands that She can call the clinic at any time with any questions, concerns, or complaints.   Thank you for your referral and allowing Palliative to assist in Mrs. Amerie B Smith's care.   Number and complexity of problems addressed: 2 HIGH - 1 or more chronic illnesses with SEVERE exacerbation, progression, or side effects of treatment - advanced cancer, pain. Any controlled substances utilized were  prescribed in the context of palliative care.  Time Total: 45 min   Visit consisted of counseling and education dealing with the complex and emotionally intense issues of symptom management and palliative care in the setting of serious and potentially life-threatening illness.Greater than 50%  of this time was spent counseling and coordinating care related to the above assessment and plan.  Signed by: Alda Lea, AGPCNP-BC Palliative Medicine Team/[place]

## 2021-11-08 ENCOUNTER — Emergency Department (HOSPITAL_COMMUNITY)
Admission: EM | Admit: 2021-11-08 | Discharge: 2021-11-09 | Disposition: A | Discharge status: Home / Self Care | Payer: Medicare Other | Attending: Emergency Medicine | Admitting: Emergency Medicine

## 2021-11-08 ENCOUNTER — Other Ambulatory Visit: Payer: Self-pay

## 2021-11-08 ENCOUNTER — Emergency Department (HOSPITAL_COMMUNITY): Payer: Medicare Other

## 2021-11-08 ENCOUNTER — Encounter (HOSPITAL_COMMUNITY): Payer: Self-pay | Admitting: Emergency Medicine

## 2021-11-08 DIAGNOSIS — Z96641 Presence of right artificial hip joint: Secondary | ICD-10-CM | POA: Diagnosis not present

## 2021-11-08 DIAGNOSIS — W19XXXA Unspecified fall, initial encounter: Secondary | ICD-10-CM | POA: Diagnosis not present

## 2021-11-08 DIAGNOSIS — Z471 Aftercare following joint replacement surgery: Secondary | ICD-10-CM | POA: Diagnosis not present

## 2021-11-08 DIAGNOSIS — M79604 Pain in right leg: Secondary | ICD-10-CM

## 2021-11-08 DIAGNOSIS — R509 Fever, unspecified: Secondary | ICD-10-CM | POA: Diagnosis not present

## 2021-11-08 DIAGNOSIS — M1711 Unilateral primary osteoarthritis, right knee: Secondary | ICD-10-CM | POA: Diagnosis not present

## 2021-11-08 DIAGNOSIS — R531 Weakness: Secondary | ICD-10-CM | POA: Diagnosis not present

## 2021-11-08 NOTE — ED Triage Notes (Signed)
Patient BIB [facility] from home c/o bilateral knee pain and weakness x2 weeks.  Patient recently hospitalized and states she had the symptoms then as well.  Patient denies recent falls.   108/70 92 HR 97% CBG 164

## 2021-11-09 ENCOUNTER — Inpatient Hospital Stay: Payer: Medicaid Other

## 2021-11-09 ENCOUNTER — Telehealth: Payer: Self-pay | Admitting: Family Medicine

## 2021-11-09 ENCOUNTER — Other Ambulatory Visit: Payer: Self-pay

## 2021-11-09 ENCOUNTER — Other Ambulatory Visit: Payer: Self-pay | Admitting: Radiation Oncology

## 2021-11-09 ENCOUNTER — Ambulatory Visit
Admission: RE | Admit: 2021-11-09 | Discharge: 2021-11-09 | Disposition: A | Discharge status: Home / Self Care | Payer: Medicare Other | Source: Ambulatory Visit | Attending: Radiation Oncology | Admitting: Radiation Oncology

## 2021-11-09 ENCOUNTER — Telehealth: Payer: Self-pay

## 2021-11-09 VITALS — BP 129/81 | HR 97 | Temp 98.2°F | Resp 20

## 2021-11-09 DIAGNOSIS — Z743 Need for continuous supervision: Secondary | ICD-10-CM | POA: Diagnosis not present

## 2021-11-09 DIAGNOSIS — R531 Weakness: Secondary | ICD-10-CM | POA: Diagnosis not present

## 2021-11-09 DIAGNOSIS — C7931 Secondary malignant neoplasm of brain: Secondary | ICD-10-CM | POA: Diagnosis not present

## 2021-11-09 DIAGNOSIS — C3490 Malignant neoplasm of unspecified part of unspecified bronchus or lung: Secondary | ICD-10-CM

## 2021-11-09 LAB — RAD ONC ARIA SESSION SUMMARY
Course Elapsed Days: 11
Plan Fractions Treated to Date: 1
Plan Prescribed Dose Per Fraction: 9 Gy
Plan Total Fractions Prescribed: 2
Plan Total Prescribed Dose: 18 Gy
Reference Point Dosage Given to Date: 18 Gy
Reference Point Session Dosage Given: 9 Gy
Session Number: 2

## 2021-11-09 MED ORDER — ACETAMINOPHEN 500 MG PO TABS
1000.0000 mg | ORAL_TABLET | Freq: Once | ORAL | Status: AC
  Administered 2021-11-09: 1000 mg via ORAL
  Filled 2021-11-09: qty 2

## 2021-11-09 MED ORDER — PANTOPRAZOLE SODIUM 40 MG PO TBEC: 40.0000 mg | DELAYED_RELEASE_TABLET | Freq: Every day | ORAL | 2 refills | Status: DC

## 2021-11-09 MED ORDER — DEXAMETHASONE 4 MG PO TABS: ORAL_TABLET | ORAL | 0 refills | Status: DC

## 2021-11-09 NOTE — ED Provider Notes (Signed)
[facility] Emergency Department Provider Note MRN:  [id number]  Arrival date & time: 11/09/21     Chief Complaint   Leg Pain   History of Present Illness   Sandra Horne is a 66 y.o. year-old female presents to the ED with chief complaint of right leg pain.  She denies any fall or injury.  She states that the pain is worsened with movement and walking.  She denies fevers or chills.  She denies any successful treatments prior to arrival.  History provided by patient.   Review of Systems  Pertinent positive and negative review of systems noted in HPI.    Physical Exam   Vitals:   11/08/21 2329  BP: 110/70  Pulse: 87  Resp: 20  Temp: 98.7 F (37.1 C)  SpO2: 100%    CONSTITUTIONAL:  nontoxic-appearing, NAD NEURO:  Alert and oriented x 3, CN 3-12 grossly intact EYES:  eyes equal and reactive ENT/NECK:  Supple, no stridor  CARDIO:  appears well-perfused  PULM:  No respiratory distress,  GI/GU:  non-distended,  MSK/SPINE:  No gross deformities, no edema, moves all extremities, normal PROM of the right knee, no effusion SKIN:  no rash, atraumatic, no erythema   *Additional and/or pertinent findings included in MDM below  Diagnostic and Interventional Summary    Labs Reviewed - No data to display  DG Knee Complete 4 Views Right  Final Result    DG Hip Unilat W or Wo Pelvis 2-3 Views Right  Final Result      Medications  acetaminophen (TYLENOL) tablet 1,000 mg (has no administration in time range)     Procedures  /  Critical Care Procedures  ED Course and Medical Decision Making  I have reviewed the triage vital signs, the nursing notes, and pertinent available records from the EMR.  Social Determinants Affecting Complexity of Care: Patient has no clinically significant social determinants affecting this chief complaint..   ED Course:    Medical Decision Making Patient here with right leg pain.  She has been able to ambulate,  but reports that it is painful.  She has been having symptoms for the past 2 weeks.  She denies falls or injury.  We will check plain films and reassess.    Amount and/or Complexity of Data Reviewed Radiology: ordered and independent interpretation performed.    Details: No obvious fracture  Risk OTC drugs.     Consultants: No consultations were needed in caring for this patient. Patient will need to follow-up with orthopedics.  Treatment and Plan: Emergency department workup does not suggest an emergent condition requiring admission or immediate intervention beyond  what has been performed at this time. The patient is safe for discharge and has  been instructed to return immediately for worsening symptoms, change in  symptoms or any other concerns    Final Clinical Impressions(s) / ED Diagnoses     ICD-10-CM   1. 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  You need to follow-up with orthopedics.  Please call your orthopedic doctor or you can contact the one listed.  Take Tylenol for pain.  You can apply ice and use the knee wrap for comfort.      Montine Circle,  PA-C 11/09/21 0026    Jeanell Sparrow, DO 11/09/21 [phone]

## 2021-11-09 NOTE — Discharge Instructions (Signed)
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  You need to follow-up with orthopedics.  Please call your orthopedic doctor or you can contact the one listed.  Take Tylenol for pain.  You can apply ice and use the knee wrap for comfort.

## 2021-11-09 NOTE — Telephone Encounter (Signed)
RN called patient to inform her of refill on Decadron and a new prescription medication unable to reach attempted to left voicemail but mailbox is full.  Tried calling [name]'s daughter Sandra Horne attempt was unsuccessful.

## 2021-11-09 NOTE — ED Notes (Signed)
Called [facility] to request transport to pt's daughter's home  Address: [address] [place]  Attempted to call daughter Nelly Rout at [phone]; no answer. Attempted on alternate number in file; no answer.

## 2021-11-09 NOTE — Progress Notes (Signed)
Patient in to clinic for 15 minute observation [place] [facility].  Denies headache, nausea, dizziness, visual changes, and ringing in ears. Reports feeling mildly fatigued.  Patient requesting refill on dexamethasone.  Patient left clinic via wheelchair had recently fractured hip.  Patient reminded not to do anything strenuous for the next 24 hours and to call [phone] for nurse. Vitals: 129/81, 98.2,97,20,100 %.

## 2021-11-09 NOTE — ED Notes (Signed)
Patient return address is [address] per Daughter, [name].

## 2021-11-09 NOTE — Telephone Encounter (Signed)
Sandra Horne with [facility] [phone]  Called stating she is needing verbal order for physical therapy.  1 x a week for 1 week 2 x a week for 2 weeks 1 x a week for 6 weeks      Pt is scheduled to be seen as a NEW PATIENT tomorrow 11/10/21 @ 2pm

## 2021-11-09 NOTE — ED Notes (Signed)
[facility] at bedside 

## 2021-11-10 ENCOUNTER — Ambulatory Visit: Payer: Medicare Other | Admitting: Family Medicine

## 2021-11-10 NOTE — Telephone Encounter (Signed)
Pt missed her appt today. Will  approve after her initial visit

## 2021-11-11 ENCOUNTER — Ambulatory Visit: Admission: RE | Admit: 2021-11-11 | Payer: Medicare Other | Source: Ambulatory Visit | Admitting: Radiation Oncology

## 2021-11-11 ENCOUNTER — Telehealth: Payer: Self-pay

## 2021-11-11 ENCOUNTER — Inpatient Hospital Stay: Payer: Medicaid Other | Admitting: Nurse Practitioner

## 2021-11-11 ENCOUNTER — Inpatient Hospital Stay: Payer: Medicaid Other

## 2021-11-11 NOTE — Telephone Encounter (Signed)
Attempted to call pt for her phone visit and no answer, unable to LVM d/t mailbox full

## 2021-11-12 ENCOUNTER — Telehealth: Payer: Self-pay

## 2021-11-12 ENCOUNTER — Ambulatory Visit (HOSPITAL_COMMUNITY): Payer: Medicare Other | Admitting: Physical Therapy

## 2021-11-12 ENCOUNTER — Telehealth: Payer: Self-pay | Admitting: *Deleted

## 2021-11-12 NOTE — Telephone Encounter (Signed)
Returned patient's phone call, spoke with patient 

## 2021-11-12 NOTE — Telephone Encounter (Signed)
RN attempted to call patient about medication order was unable to reach and couldn't leave voicemail.

## 2021-11-13 ENCOUNTER — Encounter: Payer: Self-pay | Admitting: Urology

## 2021-11-13 ENCOUNTER — Other Ambulatory Visit: Payer: Self-pay

## 2021-11-13 ENCOUNTER — Telehealth: Payer: Self-pay

## 2021-11-13 ENCOUNTER — Ambulatory Visit
Admission: RE | Admit: 2021-11-13 | Discharge: 2021-11-13 | Disposition: A | Discharge status: Home / Self Care | Payer: Medicare Other | Source: Ambulatory Visit | Attending: Radiation Oncology | Admitting: Radiation Oncology

## 2021-11-13 ENCOUNTER — Inpatient Hospital Stay: Payer: Medicaid Other

## 2021-11-13 DIAGNOSIS — C3490 Malignant neoplasm of unspecified part of unspecified bronchus or lung: Secondary | ICD-10-CM | POA: Diagnosis not present

## 2021-11-13 DIAGNOSIS — C7931 Secondary malignant neoplasm of brain: Secondary | ICD-10-CM | POA: Diagnosis not present

## 2021-11-13 LAB — RAD ONC ARIA SESSION SUMMARY
Course Elapsed Days: 15
Plan Fractions Treated to Date: 2
Plan Prescribed Dose Per Fraction: 9 Gy
Plan Total Fractions Prescribed: 2
Plan Total Prescribed Dose: 18 Gy
Reference Point Dosage Given to Date: 27 Gy
Reference Point Session Dosage Given: 9 Gy
Session Number: 3

## 2021-11-13 NOTE — Progress Notes (Signed)
Patient in to clinic for 15 minute observation [place] [facility]. Denies headache, nausea, dizziness, visual changes, and ringing in ears. Reports feeling mildly fatigued.  Explained to patient that Decadron order was placed few days ago and that staff try to call but voicemail was full and no one answered phone.  Patient was told to go over to pharmacy today to pick up medication ASAP.  She verbalized understanding and said that she would.  RN also called patient daughter [name] and expressed the importance of picking up medication right away she too verbalized understanding and said to this writer that they will go to get medication.  Vitals:  98.7-89-18-124/83 O2 sat 100%.

## 2021-11-13 NOTE — Telephone Encounter (Signed)
RN spoke with patient in clinic this afternoon about the inability to reach her for medication pickup.  Expressed to her the importance of picking up medicine and start ASAP.  RN called and spoke with the [name] and reiterated to her the importance of the patient taking her medications when prescribed.  She  was made aware that medication can be picked up at the [facility] in [place] today.  Both ladies verbalized understanding and state they will get the medications today.  Nothing else follows.

## 2021-11-17 ENCOUNTER — Ambulatory Visit (HOSPITAL_COMMUNITY): Payer: Medicare Other | Admitting: Physical Therapy

## 2021-11-19 ENCOUNTER — Ambulatory Visit (HOSPITAL_COMMUNITY): Payer: Medicare Other | Admitting: Physical Therapy

## 2021-11-21 NOTE — Op Note (Signed)
Name: KRISTELL WOODING    MRN: [id number]   Date: 10/29/2021    DOB: 1955/09/02   STEREOTACTIC RADIOSURGERY OPERATIVE NOTE  PRE-OPERATIVE DIAGNOSIS:  Metastatic nonsmall cell Lung CA, multiple lesions (5 total)  POST-OPERATIVE DIAGNOSIS:  Same  PROCEDURE:  Stereotactic Radiosurgery  SURGEON:  Elwin Sleight, DO  RADIATION ONCOLOGIST: Dr. Tammi Klippel  TECHNIQUE:  The patient underwent a radiation treatment planning session in the radiation oncology simulation suite under the care of the radiation oncology physician and physicist.  I participated closely in the radiation treatment planning afterwards. The patient underwent planning CT which was fused to 3T high resolution MRI with 1 mm axial slices.  These images were fused on the planning system.  We contoured the gross target volumes and subsequently expanded this to yield the Planning Target Volume. I actively participated in the planning process.  I helped to define and review the target contours and also the contours of the optic pathway, eyes, brainstem and selected nearby organs at risk.  All the dose constraints for critical structures were reviewed and compared to AAPM Task Group 101.  The prescription dose conformity was reviewed.  I approved the plan electronically.    Accordingly, [name]  was brought to the TrueBeam stereotactic radiation treatment linac and placed in the custom immobilization mask.  The patient was aligned according to the IR fiducial markers with BrainLab Exactrac, then orthogonal x-rays were used in ExacTrac with the 6DOF robotic table and the shifts were made to align the patient.  [name] received stereotactic radiosurgery in 1 fraction of 2, uneventfully.    The detailed description of the procedure is recorded in the radiation oncology procedure note.  I was present for the duration of the procedure.  DISPOSITION:   Following delivery, the patient was transported to nursing in stable condition and  monitored for possible acute effects to be discharged to home in stable condition with follow-up in one month.  Elwin Sleight, [facility]

## 2021-11-21 NOTE — Addendum Note (Signed)
Encounter addended by: Karsten Ro, DO on: 11/21/2021 1:24 PM  Actions taken: Clinical Note Signed

## 2021-11-23 ENCOUNTER — Encounter (HOSPITAL_COMMUNITY): Payer: Self-pay | Admitting: Physical Therapy

## 2021-11-23 ENCOUNTER — Ambulatory Visit (HOSPITAL_COMMUNITY): Payer: Medicare Other | Attending: Orthopedic Surgery | Admitting: Physical Therapy

## 2021-11-23 DIAGNOSIS — S72001A Fracture of unspecified part of neck of right femur, initial encounter for closed fracture: Secondary | ICD-10-CM | POA: Diagnosis not present

## 2021-11-23 DIAGNOSIS — M6281 Muscle weakness (generalized): Secondary | ICD-10-CM | POA: Diagnosis not present

## 2021-11-23 DIAGNOSIS — R2689 Other abnormalities of gait and mobility: Secondary | ICD-10-CM | POA: Insufficient documentation

## 2021-11-23 DIAGNOSIS — R29898 Other symptoms and signs involving the musculoskeletal system: Secondary | ICD-10-CM | POA: Diagnosis not present

## 2021-11-23 DIAGNOSIS — M25551 Pain in right hip: Secondary | ICD-10-CM | POA: Insufficient documentation

## 2021-11-23 NOTE — Therapy (Signed)
OUTPATIENT PHYSICAL THERAPY LOWER EXTREMITY EVALUATION   Patient Name: Sandra Horne MRN: [id number] DOB:Apr 23, 1955, 66 y.o., female 6 Date: 11/23/2021   PT End of Session - 11/23/21 0918     Visit Number 1    Number of Visits 16    Date for PT Re-Evaluation 01/18/22    Authorization Type Primary: Medicare Secondary: Medicaid    Progress Note Due on Visit 10    PT Start Time 0930    PT Stop Time 1005    PT Time Calculation (min) 35 min    Equipment Utilized During Treatment Gait belt    Activity Tolerance Patient tolerated treatment well    Behavior During Therapy WFL for tasks assessed/performed             Past Medical History:  Diagnosis Date   Cancer ([place])    Lung and Brain mets   Past Surgical History:  Procedure Laterality Date   BRONCHIAL BRUSHINGS  03/26/2021   Procedure: BRONCHIAL BRUSHINGS;  Surgeon: Margaretha Seeds, MD;  [facility]: [facility];  Service: Cardiopulmonary;;   BRONCHIAL NEEDLE ASPIRATION BIOPSY  03/26/2021   Procedure: BRONCHIAL NEEDLE ASPIRATION BIOPSIES;  Surgeon: Margaretha Seeds, MD;  Location: [facility];  Service: Cardiopulmonary;;   BRONCHIAL WASHINGS  03/26/2021   Procedure: BRONCHIAL WASHINGS;  Surgeon: Margaretha Seeds, MD;  Location: [facility];  Service: Cardiopulmonary;;   ECTOPIC PREGNANCY SURGERY     ENDOBRONCHIAL ULTRASOUND Bilateral 03/26/2021   Procedure: ENDOBRONCHIAL ULTRASOUND;  Surgeon: Margaretha Seeds, MD;  [facility];  Service: Cardiopulmonary;  Laterality: Bilateral;   HEMOSTASIS CONTROL  03/26/2021   Procedure: HEMOSTASIS CONTROL;  Surgeon: Margaretha Seeds, MD;  [facility];  Service: Cardiopulmonary;;   HIP ARTHROPLASTY Right 10/13/2021   Procedure: ARTHROPLASTY BIPOLAR HIP (HEMIARTHROPLASTY);  Surgeon: Carole Civil, MD;  [facility]: [facility];  Service: Orthopedics;  Laterality: Right;   Patient Active Problem List   Diagnosis Date Noted   Malnutrition of moderate degree  11/02/2021   Rt Sided Weakness due to Vasogenic Brain edema 10/31/2021   Tobacco abuse 10/31/2021   Closed displaced fracture of right femoral neck ([place]) 10/12/2021   Malignant neoplasm metastatic to brain [facility]) 03/27/2021   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 ([place]) 03/26/2021   Acute encephalopathy 03/25/2021   Primary cancer of left lower lobe of lung ([place]) 01/14/2020   CLOSED FRACTURE OF UNSPECIFIED PART OF HUMERUS 04/01/2010    PCP: no PCP  REFERRING PROVIDER: Carole Civil, MD   REFERRING DIAG: S72.001A (ICD-10-CM) - Closed displaced fracture of right femoral neck ([place])   THERAPY DIAG:  Other abnormalities of gait and mobility  Pain in right hip  Muscle weakness (generalized)  Other symptoms and signs involving the musculoskeletal system  Rationale for Evaluation and Treatment Rehabilitation  ONSET DATE: 10/13/21  SUBJECTIVE:   SUBJECTIVE STATEMENT: Patient states she passed out and broke her hip. She had to have a R hemiarthroplasty on 10/13/21. She was then discharged and is staying with her daughter. She had a little home health PT. Patient did not have to use AD before this. She is having trouble with walking, stairs, transfers, balance.   PERTINENT HISTORY: Lung cancer with mets to brain  PAIN:  Are you having pain? No  PRECAUTIONS: Other: Direct lateral hip precautions  WEIGHT BEARING RESTRICTIONS No  FALLS:  Has patient fallen in last 6 months? Yes. Number of falls 1  LIVING ENVIRONMENT: Lives with: lives with their  daughter Lives in: House/apartment Stairs: Yes: External: 1 steps; none Has following equipment at home: Environmental consultant - 2 wheeled, Wheelchair (manual), bed side commode, and Grab bars  OCCUPATION: Retired  PLOF: [place] get well and back to her life   OBJECTIVE:   PATIENT SURVEYS:  FOTO 36% function  COGNITION:  Overall cognitive status: Within functional limits for tasks  assessed     SENSATION: WFL   POSTURE: rounded shoulders and forward head  PALPATION: No tenderness noted  LOWER EXTREMITY ROM:  decreased R hip extension, lacks R TKE  Active ROM Right eval Left eval  Hip flexion    Hip extension    Hip abduction    Hip adduction    Hip internal rotation    Hip external rotation    Knee flexion    Knee extension    Ankle dorsiflexion    Ankle plantarflexion    Ankle inversion    Ankle eversion     (Blank rows = not tested)  LOWER EXTREMITY MMT:  MMT Right eval Left eval  Hip flexion 4 4+  Hip extension    Hip abduction    Hip adduction    Hip internal rotation    Hip external rotation    Knee flexion 4+ 5  Knee extension 4 5  Ankle dorsiflexion 4+ 5  Ankle plantarflexion    Ankle inversion    Ankle eversion     (Blank rows = not tested)   FUNCTIONAL TESTS:  5 times sit to stand: 21.11 seconds with bilateral UE use on RW 2 minute walk test: 100 feet with RW Transfers: labored, bilateral UE use, relies LLE  GAIT: Distance walked: 100 Assistive device utilized: Environmental consultant - 2 wheeled Level of assistance: CGA Comments: 2MWT, slow, labored, R knee flexed in stance with antalgic gait, decreased R stance phase, unsteady turnings with body coming outside or RW    TODAY'S TREATMENT: 11/23/21 LAQ 10 x 5 second holds  Bridge 2x 10  SLR 1x 10 RLE   PATIENT EDUCATION:  Education details: Patient educated on exam findings, POC, scope of PT, HEP, and proper RW use. Person educated: Patient Education method: Explanation, Demonstration, and Handouts Education comprehension: verbalized understanding, returned demonstration, verbal cues required, and tactile cues required   HOME EXERCISE PROGRAM: Access Code: [email or web address] Date: 11/23/2021 - Seated Long Arc Quad  - 3 x daily - 7 x weekly - 2 sets - 10 reps - 5 second hold - Supine Bridge  - 3 x daily - 7 x weekly - 2 sets - 10 reps - Supine Straight Leg Raises (Mirrored)  - 3 x  daily - 7 x weekly - 1 sets - 10 reps  ASSESSMENT:  CLINICAL IMPRESSION: Patient a 66 y.o. y.o. female who was seen today for physical therapy evaluation and treatment for R hip pain s/p hemiarthroplasty on 10/13/21 following closed displaced fracture of R femoral neck. Patient presents with pain limited deficits in R hip strength, ROM, endurance, activity tolerance, gait, balance, and functional mobility with ADL. Patient is having to modify and restrict ADL as indicated by outcome measure score as well as subjective information and objective measures which is affecting overall participation. Patient will benefit from skilled physical therapy in order to improve function and reduce impairment.   OBJECTIVE IMPAIRMENTS Abnormal gait, decreased activity tolerance, decreased balance, decreased endurance, decreased mobility, difficulty walking, decreased ROM, decreased strength, increased muscle spasms, impaired flexibility, improper body mechanics, and pain.   ACTIVITY LIMITATIONS carrying,  lifting, bending, standing, squatting, stairs, transfers, locomotion level, and caring for others  PARTICIPATION LIMITATIONS: meal prep, cleaning, laundry, shopping, community activity, and yard work  [place], Time since onset of injury/illness/exacerbation, and 1-2 comorbidities: lung cancer with mets to brain, OA  are also affecting patient's functional outcome.   REHAB POTENTIAL: Good  CLINICAL DECISION MAKING: Evolving/moderate complexity  EVALUATION COMPLEXITY: Moderate   GOALS: Goals reviewed with patient? Yes  SHORT TERM GOALS: Target date: 12/21/2021  Patient will be independent with HEP in order to improve functional outcomes. Baseline:  Goal status: INITIAL  2.  Patient will report at least 25% improvement in symptoms for improved quality of life. Baseline:  Goal status: INITIAL   LONG TERM GOALS: Target date: 01/18/2022  Patient will report at least 75% improvement in  symptoms for improved quality of life. Baseline:  Goal status: INITIAL  2.  Patient will improve FOTO score by at least 26 points in order to indicate improved tolerance to activity. Baseline: 36% function Goal status: INITIAL  3.  Patient will be able to navigate stairs with reciprocal pattern without compensation in order to demonstrate improved LE strength. Baseline: unable Goal status: INITIAL  4.  Patient will be able to ambulate at least 226 feet in 2MWT in order to demonstrate improved tolerance to activity. Baseline: 100 feet with RW Goal status: INITIAL  5.  Patient will demonstrate grade of 5/5 MMT grade in all tested musculature as evidence of improved strength to assist with stair ambulation and gait.   Baseline: see MMT Goal status: INITIAL  PLAN: PT FREQUENCY: 2x/week  PT DURATION: 8 weeks  PLANNED INTERVENTIONS: Therapeutic exercises, Therapeutic activity, Neuromuscular re-education, Balance training, Gait training, Patient/Family education, Joint manipulation, Joint mobilization, Stair training, Orthotic/Fit training, DME instructions, Aquatic Therapy, Dry Needling, Electrical stimulation, Spinal manipulation, Spinal mobilization, Cryotherapy, Moist heat, Compression bandaging, scar mobilization, Splintting, Taping, Traction, Ultrasound, Ionotophoresis 4mg /ml Dexamethasone, and Manual therapy  PLAN FOR NEXT SESSION: Hip and RLE strength, balance and gait training, progress to LRAD when able, functional strength when able   Mearl Latin, PT 11/23/2021, 10:20 AM

## 2021-11-24 ENCOUNTER — Other Ambulatory Visit: Payer: Self-pay

## 2021-11-25 ENCOUNTER — Encounter: Payer: Self-pay | Admitting: Orthopedic Surgery

## 2021-11-25 ENCOUNTER — Ambulatory Visit (HOSPITAL_COMMUNITY): Payer: Medicare Other

## 2021-11-25 ENCOUNTER — Ambulatory Visit: Payer: Medicare Other | Admitting: Family Medicine

## 2021-11-25 ENCOUNTER — Ambulatory Visit (INDEPENDENT_AMBULATORY_CARE_PROVIDER_SITE_OTHER): Payer: Medicare Other | Admitting: Orthopedic Surgery

## 2021-11-25 DIAGNOSIS — R2689 Other abnormalities of gait and mobility: Secondary | ICD-10-CM

## 2021-11-25 DIAGNOSIS — R29898 Other symptoms and signs involving the musculoskeletal system: Secondary | ICD-10-CM

## 2021-11-25 DIAGNOSIS — M25551 Pain in right hip: Secondary | ICD-10-CM

## 2021-11-25 DIAGNOSIS — S72001A Fracture of unspecified part of neck of right femur, initial encounter for closed fracture: Secondary | ICD-10-CM

## 2021-11-25 DIAGNOSIS — M6281 Muscle weakness (generalized): Secondary | ICD-10-CM | POA: Diagnosis not present

## 2021-11-25 MED ORDER — OXYCODONE HCL 5 MG PO TABS: 5.0000 mg | ORAL_TABLET | Freq: Three times a day (TID) | ORAL | 0 refills | Status: AC | PRN

## 2021-11-25 NOTE — Therapy (Signed)
OUTPATIENT PHYSICAL THERAPY LOWER EXTREMITY EVALUATION   Patient Name: Sandra Horne MRN: [id number] DOB:05-15-1955, 66 y.o., female Today's Date: 11/25/2021   PT End of Session - 11/25/21 1152     Visit Number 2    Number of Visits 16    Date for PT Re-Evaluation 01/18/22    Authorization Type Primary: Medicare Secondary: Medicaid    Progress Note Due on Visit 10    PT Start Time 1130    PT Stop Time 1210    PT Time Calculation (min) 40 min              Past Medical History:  Diagnosis Date   Cancer ([place])    Lung and Brain mets   Past Surgical History:  Procedure Laterality Date   BRONCHIAL BRUSHINGS  03/26/2021   Procedure: BRONCHIAL BRUSHINGS;  Surgeon: Margaretha Seeds, MD;  [facility];  Service: Cardiopulmonary;;   BRONCHIAL NEEDLE ASPIRATION BIOPSY  03/26/2021   Procedure: BRONCHIAL NEEDLE ASPIRATION BIOPSIES;  Surgeon: Margaretha Seeds, MD;  Location: [facility];  Service: Cardiopulmonary;;   BRONCHIAL WASHINGS  03/26/2021   Procedure: BRONCHIAL WASHINGS;  Surgeon: Margaretha Seeds, MD;  Location: [facility];  Service: Cardiopulmonary;;   ECTOPIC PREGNANCY SURGERY     ENDOBRONCHIAL ULTRASOUND Bilateral 03/26/2021   Procedure: ENDOBRONCHIAL ULTRASOUND;  Surgeon: Margaretha Seeds, MD;  [facility];  Service: Cardiopulmonary;  Laterality: Bilateral;   HEMOSTASIS CONTROL  03/26/2021   Procedure: HEMOSTASIS CONTROL;  Surgeon: Margaretha Seeds, MD;  [facility];  Service: Cardiopulmonary;;   HIP ARTHROPLASTY Right 10/13/2021   Procedure: ARTHROPLASTY BIPOLAR HIP (HEMIARTHROPLASTY);  Surgeon: Carole Civil, MD;  Location[facility];  Service: Orthopedics;  Laterality: Right;   Patient Active Problem List   Diagnosis Date Noted   Malnutrition of moderate degree 11/02/2021   Rt Sided Weakness due to Vasogenic Brain edema 10/31/2021   Tobacco abuse 10/31/2021   Closed displaced fracture of right femoral neck ([place])  10/12/2021   Malignant neoplasm metastatic to brain [facility]) 03/27/2021   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 ([place]) 03/26/2021   Acute encephalopathy 03/25/2021   Primary cancer of left lower lobe of lung ([place]) 01/14/2020   CLOSED FRACTURE OF UNSPECIFIED PART OF HUMERUS 04/01/2010    PCP: no PCP  REFERRING PROVIDER: Carole Civil, MD   REFERRING DIAG: S72.001A (ICD-10-CM) - Closed displaced fracture of right femoral neck ([place])   THERAPY DIAG:  Other abnormalities of gait and mobility  Other symptoms and signs involving the musculoskeletal system  Pain in right hip  Muscle weakness (generalized)  Rationale for Evaluation and Treatment Rehabilitation  ONSET DATE: 10/13/21  SUBJECTIVE:   SUBJECTIVE STATEMENT: 2/10 pain today.  Saw MD today; good report; she is coming from MD office; wants to be seen early her original appt today is at 4:00 but doesn't want to go home and have to come back; currently living with daughter in [place].   Eval:Patient states she passed out and broke her hip. She had to have a R hemiarthroplasty on 10/13/21. She was then discharged and is staying with her daughter. She had a little home health PT. Patient did not have to use AD before this. She is having trouble with walking, stairs, transfers, balance.   PERTINENT HISTORY: Lung cancer with mets to brain  PAIN:  Are you having pain? No  PRECAUTIONS: Other: Direct lateral hip precautions  WEIGHT BEARING RESTRICTIONS No  FALLS:  Has  patient fallen in last 6 months? Yes. Number of falls 1  LIVING ENVIRONMENT: Lives with: lives with their daughter Lives in: House/apartment Stairs: Yes: External: 1 steps; none Has following equipment at home: Environmental consultant - 2 wheeled, Wheelchair (manual), bed side commode, and Grab bars  OCCUPATION: Retired  PLOF: Independent  PATIENT GOALS get well and back to her life   OBJECTIVE:   PATIENT SURVEYS:  FOTO 36%  function  COGNITION:  Overall cognitive status: Within functional limits for tasks assessed     SENSATION: WFL   POSTURE: rounded shoulders and forward head  PALPATION: No tenderness noted  LOWER EXTREMITY ROM:  decreased R hip extension, lacks R TKE  Active ROM Right eval Left eval  Hip flexion    Hip extension    Hip abduction    Hip adduction    Hip internal rotation    Hip external rotation    Knee flexion    Knee extension    Ankle dorsiflexion    Ankle plantarflexion    Ankle inversion    Ankle eversion     (Blank rows = not tested)  LOWER EXTREMITY MMT:  MMT Right eval Left eval  Hip flexion 4 4+  Hip extension    Hip abduction    Hip adduction    Hip internal rotation    Hip external rotation    Knee flexion 4+ 5  Knee extension 4 5  Ankle dorsiflexion 4+ 5  Ankle plantarflexion    Ankle inversion    Ankle eversion     (Blank rows = not tested)   FUNCTIONAL TESTS:  5 times sit to stand: 21.11 seconds with bilateral UE use on RW 2 minute walk test: 100 feet with RW Transfers: labored, bilateral UE use, relies LLE  GAIT: Distance walked: 100 Assistive device utilized: Environmental consultant - 2 wheeled Level of assistance: CGA Comments: 2MWT, slow, labored, R knee flexed in stance with antalgic gait, decreased R stance phase, unsteady turnings with body coming outside or RW    TODAY'S TREATMENT: 11/25/21 Review of HEP and goals Seated: LAQ's 5 sec hold x 10 Heel/toe raises x 10 Hip adduction ball squeeze 5" x 10  Sit to stand no UE assist 2 x 5  Standing: // bars Heel raises 2 x 10 Mini squats  2 x 10 Marching 2 x 10 Side stepping length of bars x 3 down and back  Supine: SLR x 10 Bridge 2 x 10   11/23/21 LAQ 10 x 5 second holds  Bridge 2x 10  SLR 1x 10 RLE   PATIENT EDUCATION:  Education details: Patient educated on exam findings, POC, scope of PT, HEP, and proper RW use. Person educated: Patient Education method: Explanation,  Demonstration, and Handouts Education comprehension: verbalized understanding, returned demonstration, verbal cues required, and tactile cues required   HOME EXERCISE PROGRAM: Access Code: [email or web address] Date: 11/23/2021 - Seated Long Arc Quad  - 3 x daily - 7 x weekly - 2 sets - 10 reps - 5 second hold - Supine Bridge  - 3 x daily - 7 x weekly - 2 sets - 10 reps - Supine Straight Leg Raises (Mirrored)  - 3 x daily - 7 x weekly - 1 sets - 10 reps  ASSESSMENT:  CLINICAL IMPRESSION: Today's session started with review of HEP and goals; patient verbalizes understanding and agreement with set rehab goals. Added standing exercise today without issue; patient needs verbal cues to fully extend right knee on standing; she tends to  shift to the left and weight bear more left than right.  Unable to take large steps laterally to the right compared to left.  Slight lag noted with SLR; improves with cues but returns with fatigue. Patient will benefit from continued skilled therapy services to address deficits and promote optimal function.   OBJECTIVE IMPAIRMENTS Abnormal gait, decreased activity tolerance, decreased balance, decreased endurance, decreased mobility, difficulty walking, decreased ROM, decreased strength, increased muscle spasms, impaired flexibility, improper body mechanics, and pain.   ACTIVITY LIMITATIONS carrying, lifting, bending, standing, squatting, stairs, transfers, locomotion level, and caring for others  PARTICIPATION LIMITATIONS: meal prep, cleaning, laundry, shopping, community activity, and yard work  [place], Time since onset of injury/illness/exacerbation, and 1-2 comorbidities: lung cancer with mets to brain, OA  are also affecting patient's functional outcome.   REHAB POTENTIAL: Good  CLINICAL DECISION MAKING: Evolving/moderate complexity  EVALUATION COMPLEXITY: Moderate   GOALS: Goals reviewed with patient? Yes  SHORT TERM GOALS: Target date:  12/21/2021  Patient will be independent with HEP in order to improve functional outcomes. Baseline:  Goal status: ongoing  2.  Patient will report at least 25% improvement in symptoms for improved quality of life. Baseline:  Goal status: ongoing   LONG TERM GOALS: Target date: 01/18/2022  Patient will report at least 75% improvement in symptoms for improved quality of life. Baseline:  Goal status: ongoing  2.  Patient will improve FOTO score by at least 26 points in order to indicate improved tolerance to activity. Baseline: 36% function Goal status: ongoing  3.  Patient will be able to navigate stairs with reciprocal pattern without compensation in order to demonstrate improved LE strength. Baseline: unable Goal status: ongoing  4.  Patient will be able to ambulate at least 226 feet in 2MWT in order to demonstrate improved tolerance to activity. Baseline: 100 feet with RW Goal status:ongoing  5.  Patient will demonstrate grade of 5/5 MMT grade in all tested musculature as evidence of improved strength to assist with stair ambulation and gait.   Baseline: see MMT Goal status: ongoing  PLAN: PT FREQUENCY: 2x/week  PT DURATION: 8 weeks  PLANNED INTERVENTIONS: Therapeutic exercises, Therapeutic activity, Neuromuscular re-education, Balance training, Gait training, Patient/Family education, Joint manipulation, Joint mobilization, Stair training, Orthotic/Fit training, DME instructions, Aquatic Therapy, Dry Needling, Electrical stimulation, Spinal manipulation, Spinal mobilization, Cryotherapy, Moist heat, Compression bandaging, scar mobilization, Splintting, Taping, Traction, Ultrasound, Ionotophoresis 4mg /ml Dexamethasone, and Manual therapy  PLAN FOR NEXT SESSION: Hip and RLE strength, balance and gait training, progress to LRAD when able, functional strength when able   12:09 PM, 11/25/21 Mariha Sleeper Small Aideliz Garmany MPT [facility] [place] [phone] Ph:[phone]

## 2021-11-25 NOTE — Progress Notes (Signed)
Chief Complaint  Patient presents with   Post-op Follow-up    Right hip 10/13/21    Encounter Diagnosis  Name Primary?   Closed displaced fracture of right femoral neck ([place]) 10/13/21 Bipolar hip replacement  Yes   Postop appointment after for hip fracture  Status post bipolar direct lateral approach  Patient is doing well having leg.  No ankle edema.  Patient is starting to ambulate more.  No leg length difference  Return in 6 weeks continue progressive ambulation   Meds ordered this encounter  Medications   oxyCODONE (OXY IR/ROXICODONE) 5 MG immediate release tablet    Sig: Take 1 tablet (5 mg total) by mouth every 8 (eight) hours as needed for up to 5 days for moderate pain (pain score 4-6).    Dispense:  15 tablet    Refill:  0

## 2021-11-26 ENCOUNTER — Other Ambulatory Visit: Payer: Self-pay | Admitting: Orthopedic Surgery

## 2021-11-26 ENCOUNTER — Telehealth: Payer: Self-pay | Admitting: Orthopedic Surgery

## 2021-11-26 DIAGNOSIS — S72001A Fracture of unspecified part of neck of right femur, initial encounter for closed fracture: Secondary | ICD-10-CM

## 2021-11-26 MED ORDER — OXYCODONE-ACETAMINOPHEN 5-325 MG PO TABS: 1.0000 | ORAL_TABLET | Freq: Four times a day (QID) | ORAL | 0 refills | Status: AC | PRN

## 2021-11-26 NOTE — Progress Notes (Signed)
Meds ordered this encounter  Medications  . oxyCODONE-acetaminophen (PERCOCET/ROXICET) 5-325 MG tablet    Sig: Take 1 tablet by mouth every 6 (six) hours as needed for up to 7 days for severe pain.    Dispense:  28 tablet    Refill:  0    

## 2021-11-26 NOTE — Telephone Encounter (Signed)
Patient daughter stopped by and prescription did not go to pharmacy yesterday it printed instead.  Please send prescription to [facility] in [place]

## 2021-12-01 ENCOUNTER — Ambulatory Visit (HOSPITAL_COMMUNITY): Payer: Medicare Other | Attending: Orthopedic Surgery | Admitting: Physical Therapy

## 2021-12-01 DIAGNOSIS — R2689 Other abnormalities of gait and mobility: Secondary | ICD-10-CM | POA: Diagnosis not present

## 2021-12-01 DIAGNOSIS — R29898 Other symptoms and signs involving the musculoskeletal system: Secondary | ICD-10-CM | POA: Diagnosis not present

## 2021-12-01 DIAGNOSIS — M25551 Pain in right hip: Secondary | ICD-10-CM | POA: Insufficient documentation

## 2021-12-01 DIAGNOSIS — M6281 Muscle weakness (generalized): Secondary | ICD-10-CM | POA: Insufficient documentation

## 2021-12-01 NOTE — Therapy (Signed)
OUTPATIENT PHYSICAL THERAPY LOWER EXTREMITY EVALUATION   Patient Name: Sandra Horne MRN: [id number] DOB:06/08/1955, 66 y.o., female 38 Date: 12/01/2021   PT End of Session - 12/01/21 1519     Visit Number 3    Number of Visits 16    Date for PT Re-Evaluation 01/18/22    Authorization Type Primary: Medicare Secondary: Medicaid    Progress Note Due on Visit 10    PT Start Time 1518    PT Stop Time 1950    PT Time Calculation (min) 39 min    Activity Tolerance Patient tolerated treatment well    Behavior During Therapy WFL for tasks assessed/performed              Past Medical History:  Diagnosis Date   Cancer ([place])    Lung and Brain mets   Past Surgical History:  Procedure Laterality Date   BRONCHIAL BRUSHINGS  03/26/2021   Procedure: BRONCHIAL BRUSHINGS;  Surgeon: Margaretha Seeds, MD;  Location: [facility];  Service: Cardiopulmonary;;   BRONCHIAL NEEDLE ASPIRATION BIOPSY  03/26/2021   Procedure: BRONCHIAL NEEDLE ASPIRATION BIOPSIES;  Surgeon: Margaretha Seeds, MD;  Location: [facility];  Service: Cardiopulmonary;;   BRONCHIAL WASHINGS  03/26/2021   Procedure: BRONCHIAL WASHINGS;  Surgeon: Margaretha Seeds, MD;  Location: [facility];  Service: Cardiopulmonary;;   ECTOPIC PREGNANCY SURGERY     ENDOBRONCHIAL ULTRASOUND Bilateral 03/26/2021   Procedure: ENDOBRONCHIAL ULTRASOUND;  Surgeon: Margaretha Seeds, MD;  [facility];  Service: Cardiopulmonary;  Laterality: Bilateral;   HEMOSTASIS CONTROL  03/26/2021   Procedure: HEMOSTASIS CONTROL;  Surgeon: Margaretha Seeds, MD;  [facility]: [facility];  Service: Cardiopulmonary;;   HIP ARTHROPLASTY Right 10/13/2021   Procedure: ARTHROPLASTY BIPOLAR HIP (HEMIARTHROPLASTY);  Surgeon: Carole Civil, MD;  Location: [facility];  Service: Orthopedics;  Laterality: Right;   Patient Active Problem List   Diagnosis Date Noted   Malnutrition of moderate degree 11/02/2021   Rt Sided Weakness due to Vasogenic  Brain edema 10/31/2021   Tobacco abuse 10/31/2021   Closed displaced fracture of right femoral neck ([place]) 10/12/2021   Malignant neoplasm metastatic to brain [facility]) 03/27/2021   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 ([place]) 03/26/2021   Acute encephalopathy 03/25/2021   Primary cancer of left lower lobe of lung ([place]) 01/14/2020   CLOSED FRACTURE OF UNSPECIFIED PART OF HUMERUS 04/01/2010    PCP: no PCP  REFERRING PROVIDER: Carole Civil, MD   REFERRING DIAG: S72.001A (ICD-10-CM) - Closed displaced fracture of right femoral neck ([place])   THERAPY DIAG:  Other abnormalities of gait and mobility  Other symptoms and signs involving the musculoskeletal system  Pain in right hip  Muscle weakness (generalized)  Rationale for Evaluation and Treatment Rehabilitation  ONSET DATE: 10/13/21  SUBJECTIVE:   SUBJECTIVE STATEMENT: Things are going well. Feeling fine after last visit. Getting around using RW, but "still not like I should"  Eval:Patient states she passed out and broke her hip. She had to have a R hemiarthroplasty on 10/13/21. She was then discharged and is staying with her daughter. She had a little home health PT. Patient did not have to use AD before this. She is having trouble with walking, stairs, transfers, balance.   PERTINENT HISTORY: Lung cancer with mets to brain  PAIN:  Are you having pain? No  PRECAUTIONS: Other: Direct lateral hip precautions  WEIGHT BEARING RESTRICTIONS No  FALLS:  Has patient fallen in last 6 months? Yes.  Number of falls 1  LIVING ENVIRONMENT: Lives with: lives with their daughter Lives in: House/apartment Stairs: Yes: External: 1 steps; none Has following equipment at home: Environmental consultant - 2 wheeled, Wheelchair (manual), bed side commode, and Grab bars  OCCUPATION: Retired  PLOF: Independent  PATIENT GOALS get well and back to her life   OBJECTIVE:   PATIENT SURVEYS:  FOTO 36%  function  COGNITION:  Overall cognitive status: Within functional limits for tasks assessed     SENSATION: WFL   POSTURE: rounded shoulders and forward head  PALPATION: No tenderness noted  LOWER EXTREMITY ROM:  decreased R hip extension, lacks R TKE  Active ROM Right eval Left eval  Hip flexion    Hip extension    Hip abduction    Hip adduction    Hip internal rotation    Hip external rotation    Knee flexion    Knee extension    Ankle dorsiflexion    Ankle plantarflexion    Ankle inversion    Ankle eversion     (Blank rows = not tested)  LOWER EXTREMITY MMT:  MMT Right eval Left eval  Hip flexion 4 4+  Hip extension    Hip abduction    Hip adduction    Hip internal rotation    Hip external rotation    Knee flexion 4+ 5  Knee extension 4 5  Ankle dorsiflexion 4+ 5  Ankle plantarflexion    Ankle inversion    Ankle eversion     (Blank rows = not tested)   FUNCTIONAL TESTS:  5 times sit to stand: 21.11 seconds with bilateral UE use on RW 2 minute walk test: 100 feet with RW Transfers: labored, bilateral UE use, relies LLE  GAIT: Distance walked: 100 Assistive device utilized: Environmental consultant - 2 wheeled Level of assistance: CGA Comments: 2MWT, slow, labored, R knee flexed in stance with antalgic gait, decreased R stance phase, unsteady turnings with body coming outside or RW    TODAY'S TREATMENT: 12/01/21 LAQ 15 x 3" Sit to stand 2 x 10  Heel raise 2 x 10 Toe raise 2 x 10 Standing hip abduction 2 x 10 Standing hip extension 2 x 10 Step up 4 inch 2 x 10 HHA x 2  Semi tandem stance 2 x 30"   11/25/21 Review of HEP and goals Seated: LAQ's 5 sec hold x 10 Heel/toe raises x 10 Hip adduction ball squeeze 5" x 10  Sit to stand no UE assist 2 x 5  Standing: // bars Heel raises 2 x 10 Mini squats  2 x 10 Marching 2 x 10 Side stepping length of bars x 3 down and back  Supine: SLR x 10 Bridge 2 x 10   11/23/21 LAQ 10 x 5 second holds  Bridge  2x 10  SLR 1x 10 RLE   PATIENT EDUCATION:  Education details: Patient educated on exam findings, POC, scope of PT, HEP, and proper RW use. Person educated: Patient Education method: Explanation, Demonstration, and Handouts Education comprehension: verbalized understanding, returned demonstration, verbal cues required, and tactile cues required   HOME EXERCISE PROGRAM: Access Code: [email or web address]  12/01/21 - Heel Raises with Counter Support  - 3 x daily - 7 x weekly - 2 sets - 10 reps - Toe Raises with Counter Support  - 3 x daily - 7 x weekly - 2 sets - 10 reps - Standing Tandem Balance with Counter Support  - 3 x daily - 7 x weekly - 1  sets - 4 reps - 20-30 second hold  Date: 11/23/2021 - Seated Long Arc Quad  - 3 x daily - 7 x weekly - 2 sets - 10 reps - 5 second hold - Supine Bridge  - 3 x daily - 7 x weekly - 2 sets - 10 reps - Supine Straight Leg Raises (Mirrored)  - 3 x daily - 7 x weekly - 1 sets - 10 reps  ASSESSMENT:  CLINICAL IMPRESSION: Patient tolerated session well today. Progressed standing LE strengthening activity as well as balance. Patient required moderate cueing for proper foot placement during semi tandem stance for balance. Good return once in proper position. No pain noted during treatment. Continues to be limited by LE weakness. Patient will continue to benefit from skilled therapy services to reduce remaining deficits and improve functional ability.    OBJECTIVE IMPAIRMENTS Abnormal gait, decreased activity tolerance, decreased balance, decreased endurance, decreased mobility, difficulty walking, decreased ROM, decreased strength, increased muscle spasms, impaired flexibility, improper body mechanics, and pain.   ACTIVITY LIMITATIONS carrying, lifting, bending, standing, squatting, stairs, transfers, locomotion level, and caring for others  PARTICIPATION LIMITATIONS: meal prep, cleaning, laundry, shopping, community activity, and yard work  [place], Time since onset of injury/illness/exacerbation, and 1-2 comorbidities: lung cancer with mets to brain, OA  are also affecting patient's functional outcome.   REHAB POTENTIAL: Good  CLINICAL DECISION MAKING: Evolving/moderate complexity  EVALUATION COMPLEXITY: Moderate   GOALS: Goals reviewed with patient? Yes  SHORT TERM GOALS: Target date: 12/21/2021  Patient will be independent with HEP in order to improve functional outcomes. Baseline:  Goal status: ongoing  2.  Patient will report at least 25% improvement in symptoms for improved quality of life. Baseline:  Goal status: ongoing   LONG TERM GOALS: Target date: 01/18/2022  Patient will report at least 75% improvement in symptoms for improved quality of life. Baseline:  Goal status: ongoing  2.  Patient will improve FOTO score by at least 26 points in order to indicate improved tolerance to activity. Baseline: 36% function Goal status: ongoing  3.  Patient will be able to navigate stairs with reciprocal pattern without compensation in order to demonstrate improved LE strength. Baseline: unable Goal status: ongoing  4.  Patient will be able to ambulate at least 226 feet in 2MWT in order to demonstrate improved tolerance to activity. Baseline: 100 feet with RW Goal status:ongoing  5.  Patient will demonstrate grade of 5/5 MMT grade in all tested musculature as evidence of improved strength to assist with stair ambulation and gait.   Baseline: see MMT Goal status: ongoing  PLAN: PT FREQUENCY: 2x/week  PT DURATION: 8 weeks  PLANNED INTERVENTIONS: Therapeutic exercises, Therapeutic activity, Neuromuscular re-education, Balance training, Gait training, Patient/Family education, Joint manipulation, Joint mobilization, Stair training, Orthotic/Fit training, DME instructions, Aquatic Therapy, Dry Needling, Electrical stimulation, Spinal manipulation, Spinal mobilization, Cryotherapy, Moist heat, Compression  bandaging, scar mobilization, Splintting, Taping, Traction, Ultrasound, Ionotophoresis 4mg /ml Dexamethasone, and Manual therapy  PLAN FOR NEXT SESSION: Hip and RLE strength, balance and gait training, progress to LRAD when able, functional strength when able  3:20 PM, 12/01/21 Josue Hector PT DPT  Physical Therapist with [facility]  [phone]

## 2021-12-03 ENCOUNTER — Encounter (HOSPITAL_COMMUNITY): Payer: Medicare Other | Admitting: Physical Therapy

## 2021-12-04 ENCOUNTER — Encounter (HOSPITAL_COMMUNITY): Payer: Self-pay | Admitting: Physical Therapy

## 2021-12-04 ENCOUNTER — Ambulatory Visit (HOSPITAL_COMMUNITY): Payer: Medicare Other | Admitting: Physical Therapy

## 2021-12-04 ENCOUNTER — Ambulatory Visit: Payer: Medicare Other | Admitting: Family Medicine

## 2021-12-04 DIAGNOSIS — M25551 Pain in right hip: Secondary | ICD-10-CM | POA: Diagnosis not present

## 2021-12-04 DIAGNOSIS — M6281 Muscle weakness (generalized): Secondary | ICD-10-CM | POA: Diagnosis not present

## 2021-12-04 DIAGNOSIS — R2689 Other abnormalities of gait and mobility: Secondary | ICD-10-CM

## 2021-12-04 DIAGNOSIS — R29898 Other symptoms and signs involving the musculoskeletal system: Secondary | ICD-10-CM

## 2021-12-04 NOTE — Therapy (Signed)
OUTPATIENT PHYSICAL THERAPY LOWER EXTREMITY EVALUATION   Patient Name: Sandra Horne MRN: [id number] DOB:1956/03/23, 66 y.o., female 47 Date: 12/04/2021   PT End of Session - 12/04/21 0954     Visit Number 4    Number of Visits 16    Date for PT Re-Evaluation 01/18/22    Authorization Type Primary: Medicare Secondary: Medicaid    Progress Note Due on Visit 10    PT Start Time 0954    PT Stop Time 1030    PT Time Calculation (min) 36 min    Activity Tolerance Patient tolerated treatment well    Behavior During Therapy [facility] for tasks assessed/performed              Past Medical History:  Diagnosis Date   Cancer ([place])    Lung and Brain mets   Past Surgical History:  Procedure Laterality Date   BRONCHIAL BRUSHINGS  03/26/2021   Procedure: BRONCHIAL BRUSHINGS;  Surgeon: Margaretha Seeds, MD;  [facility];  Service: Cardiopulmonary;;   BRONCHIAL NEEDLE ASPIRATION BIOPSY  03/26/2021   Procedure: BRONCHIAL NEEDLE ASPIRATION BIOPSIES;  Surgeon: Margaretha Seeds, MD;  Location: [facility];  Service: Cardiopulmonary;;   BRONCHIAL WASHINGS  03/26/2021   Procedure: BRONCHIAL WASHINGS;  Surgeon: Margaretha Seeds, MD;  Location: [facility];  Service: Cardiopulmonary;;   ECTOPIC PREGNANCY SURGERY     ENDOBRONCHIAL ULTRASOUND Bilateral 03/26/2021   Procedure: ENDOBRONCHIAL ULTRASOUND;  Surgeon: Margaretha Seeds, MD;  [facility];  Service: Cardiopulmonary;  Laterality: Bilateral;   HEMOSTASIS CONTROL  03/26/2021   Procedure: HEMOSTASIS CONTROL;  Surgeon: Margaretha Seeds, MD;  [facility];  Service: Cardiopulmonary;;   HIP ARTHROPLASTY Right 10/13/2021   Procedure: ARTHROPLASTY BIPOLAR HIP (HEMIARTHROPLASTY);  Surgeon: Carole Civil, MD;  Location: [facility];  Service: Orthopedics;  Laterality: Right;   Patient Active Problem List   Diagnosis Date Noted   Malnutrition of moderate degree 11/02/2021   Rt Sided Weakness due to Vasogenic  Brain edema 10/31/2021   Tobacco abuse 10/31/2021   Closed displaced fracture of right femoral neck ([place]) 10/12/2021   Malignant neoplasm metastatic to brain [facility] 03/27/2021   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 ([place]) 03/26/2021   Acute encephalopathy 03/25/2021   Primary cancer of left lower lobe of lung ([place]) 01/14/2020   CLOSED FRACTURE OF UNSPECIFIED PART OF HUMERUS 04/01/2010    PCP: no PCP  REFERRING PROVIDER: Carole Civil, MD   REFERRING DIAG: S72.001A (ICD-10-CM) - Closed displaced fracture of right femoral neck ([place])   THERAPY DIAG:  Other abnormalities of gait and mobility  Other symptoms and signs involving the musculoskeletal system  Pain in right hip  Muscle weakness (generalized)  Rationale for Evaluation and Treatment Rehabilitation  ONSET DATE: 10/13/21  SUBJECTIVE:   SUBJECTIVE STATEMENT: Doing well but did not have time to take her medications this morning as she was running behind. Feels comfortable with HEP so far. Patient reports she has arranged to transfer OPPT services to [place], [employer]. Location because she is living with her daughter in [place] while recovering. Plans to come next Tuesday 9/12 then start in [place] next Thursday 9/15.  Eval:Patient states she passed out and broke her hip. She had to have a R hemiarthroplasty on 10/13/21. She was then discharged and is staying with her daughter. She had a little home health PT. Patient did not have to use AD before this. She is having trouble with walking, stairs,  transfers, balance.   PERTINENT HISTORY: Lung cancer with mets to brain  PAIN:  Are you having pain? No  PRECAUTIONS: Other: Direct lateral hip precautions  WEIGHT BEARING RESTRICTIONS No  FALLS:  Has patient fallen in last 6 months? Yes. Number of falls 1  LIVING ENVIRONMENT: Lives with: lives with their daughter Lives in: House/apartment Stairs: Yes: External: 1 steps; none Has following  equipment at home: Environmental consultant - 2 wheeled, Wheelchair (manual), bed side commode, and Grab bars  OCCUPATION: Retired  PLOF: Independent  PATIENT GOALS get well and back to her life   OBJECTIVE:   PATIENT SURVEYS:  FOTO 36% function  COGNITION:  Overall cognitive status: Within functional limits for tasks assessed     SENSATION: WFL   POSTURE: rounded shoulders and forward head  PALPATION: No tenderness noted  LOWER EXTREMITY ROM:  decreased R hip extension, lacks R TKE  Active ROM Right eval Left eval  Hip flexion    Hip extension    Hip abduction    Hip adduction    Hip internal rotation    Hip external rotation    Knee flexion    Knee extension    Ankle dorsiflexion    Ankle plantarflexion    Ankle inversion    Ankle eversion     (Blank rows = not tested)  LOWER EXTREMITY MMT:  MMT Right eval Left eval  Hip flexion 4 4+  Hip extension    Hip abduction    Hip adduction    Hip internal rotation    Hip external rotation    Knee flexion 4+ 5  Knee extension 4 5  Ankle dorsiflexion 4+ 5  Ankle plantarflexion    Ankle inversion    Ankle eversion     (Blank rows = not tested)   FUNCTIONAL TESTS:  5 times sit to stand: 21.11 seconds with bilateral UE use on RW 2 minute walk test: 100 feet with RW Transfers: labored, bilateral UE use, relies LLE  GAIT: Distance walked: 100 Assistive device utilized: Environmental consultant - 2 wheeled Level of assistance: CGA Comments: 2MWT, slow, labored, R knee flexed in stance with antalgic gait, decreased R stance phase, unsteady turnings with body coming outside or RW    TODAY'S TREATMENT:   12/01/21 LAQ 2# 3x10 Sit to stand no UE support 2 x 10   Standing in // bars: Hip abduction 2# 2 x 10 Hip extension 2# 2 x 10 Hip flexion 2# 2x10 Heel raise 2 x 10 Toe raise 2 x 10 Step up 6" 2 x 10 HHA x 2   Forward gait in // bars with fading UE support, just enough to eliminate trendelenburg and stabilize: x 4RT Gait  training with SPC in and out of // bars with cues for sequencing.  11/25/21 Review of HEP and goals Seated: LAQ's 5 sec hold x 10 Heel/toe raises x 10 Hip adduction ball squeeze 5" x 10  Sit to stand no UE assist 2 x 5  Standing: // bars Heel raises 2 x 10 Mini squats  2 x 10 Marching 2 x 10 Side stepping length of bars x 3 down and back  Supine: SLR x 10 Bridge 2 x 10   PATIENT EDUCATION:  Education details: Patient educated on exam findings, POC, scope of PT, HEP, and proper RW use. Person educated: Patient Education method: Explanation, Demonstration, and Handouts Education comprehension: verbalized understanding, returned demonstration, verbal cues required, and tactile cues required   HOME EXERCISE PROGRAM: Access Code:  [email or web address]  12/01/21 - Heel Raises with Counter Support  - 3 x daily - 7 x weekly - 2 sets - 10 reps - Toe Raises with Counter Support  - 3 x daily - 7 x weekly - 2 sets - 10 reps - Standing Tandem Balance with Counter Support  - 3 x daily - 7 x weekly - 1 sets - 4 reps - 20-30 second hold  Date: 11/23/2021 - Seated Long Arc Quad  - 3 x daily - 7 x weekly - 2 sets - 10 reps - 5 second hold - Supine Bridge  - 3 x daily - 7 x weekly - 2 sets - 10 reps - Supine Straight Leg Raises (Mirrored)  - 3 x daily - 7 x weekly - 1 sets - 10 reps  ASSESSMENT:  CLINICAL IMPRESSION: Progressed loads and volume as tolerated; denies pain throughout treatment session today. Initiated gait training with SPC (great difficulty with understanding sequencing). Fading UE support in // bars with cues for just enough support to improve symmetry of gait and eliminate/minimize trendelenburg.   OBJECTIVE IMPAIRMENTS Abnormal gait, decreased activity tolerance, decreased balance, decreased endurance, decreased mobility, difficulty walking, decreased ROM, decreased strength, increased muscle spasms, impaired flexibility, improper body mechanics, and pain.   ACTIVITY LIMITATIONS  carrying, lifting, bending, standing, squatting, stairs, transfers, locomotion level, and caring for others  PARTICIPATION LIMITATIONS: meal prep, cleaning, laundry, shopping, community activity, and yard work  [place], Time since onset of injury/illness/exacerbation, and 1-2 comorbidities: lung cancer with mets to brain, OA  are also affecting patient's functional outcome.   REHAB POTENTIAL: Good  CLINICAL DECISION MAKING: Evolving/moderate complexity  EVALUATION COMPLEXITY: Moderate   GOALS: Goals reviewed with patient? Yes  SHORT TERM GOALS: Target date: 12/21/2021  Patient will be independent with HEP in order to improve functional outcomes. Baseline:  Goal status: ongoing  2.  Patient will report at least 25% improvement in symptoms for improved quality of life. Baseline:  Goal status: ongoing   LONG TERM GOALS: Target date: 01/18/2022  Patient will report at least 75% improvement in symptoms for improved quality of life. Baseline:  Goal status: ongoing  2.  Patient will improve FOTO score by at least 26 points in order to indicate improved tolerance to activity. Baseline: 36% function Goal status: ongoing  3.  Patient will be able to navigate stairs with reciprocal pattern without compensation in order to demonstrate improved LE strength. Baseline: unable Goal status: ongoing  4.  Patient will be able to ambulate at least 226 feet in 2MWT in order to demonstrate improved tolerance to activity. Baseline: 100 feet with RW Goal status:ongoing  5.  Patient will demonstrate grade of 5/5 MMT grade in all tested musculature as evidence of improved strength to assist with stair ambulation and gait.   Baseline: see MMT Goal status: ongoing  PLAN: PT FREQUENCY: 2x/week  PT DURATION: 8 weeks  PLANNED INTERVENTIONS: Therapeutic exercises, Therapeutic activity, Neuromuscular re-education, Balance training, Gait training, Patient/Family education, Joint  manipulation, Joint mobilization, Stair training, Orthotic/Fit training, DME instructions, Aquatic Therapy, Dry Needling, Electrical stimulation, Spinal manipulation, Spinal mobilization, Cryotherapy, Moist heat, Compression bandaging, scar mobilization, Splintting, Taping, Traction, Ultrasound, Ionotophoresis 4mg /ml Dexamethasone, and Manual therapy  PLAN FOR NEXT SESSION: Hip and RLE strength, balance and gait training, progress to LRAD when able, functional strength when able  10:36 AM, 12/04/21 Candie Mile, PT, DPT Physical Therapist [facility]  [place]

## 2021-12-08 ENCOUNTER — Ambulatory Visit (HOSPITAL_COMMUNITY): Payer: Medicare Other | Admitting: Physical Therapy

## 2021-12-08 DIAGNOSIS — M6281 Muscle weakness (generalized): Secondary | ICD-10-CM | POA: Diagnosis not present

## 2021-12-08 DIAGNOSIS — R29898 Other symptoms and signs involving the musculoskeletal system: Secondary | ICD-10-CM | POA: Diagnosis not present

## 2021-12-08 DIAGNOSIS — M25551 Pain in right hip: Secondary | ICD-10-CM

## 2021-12-08 DIAGNOSIS — R2689 Other abnormalities of gait and mobility: Secondary | ICD-10-CM | POA: Diagnosis not present

## 2021-12-08 NOTE — Therapy (Signed)
OUTPATIENT PHYSICAL THERAPY LOWER EXTREMITY Treatment.   Patient Name: Sandra Horne MRN: [id number] DOB:13-Nov-1955, 66 y.o., female 78 Date: 12/08/2021   PT End of Session - 12/08/21 1037     Visit Number 5    Number of Visits 16    Date for PT Re-Evaluation 01/18/22    Authorization Type Primary: Medicare Secondary: Medicaid    Progress Note Due on Visit 10    PT Start Time 1005    PT Stop Time 1036    PT Time Calculation (min) 31 min    Equipment Utilized During Treatment Gait belt    Activity Tolerance Patient tolerated treatment well    Behavior During Therapy WFL for tasks assessed/performed              Past Medical History:  Diagnosis Date   Cancer ([place])    Lung and Brain mets   Past Surgical History:  Procedure Laterality Date   BRONCHIAL BRUSHINGS  03/26/2021   Procedure: BRONCHIAL BRUSHINGS;  Surgeon: Margaretha Seeds, MD;  Location: [facility];  Service: Cardiopulmonary;;   BRONCHIAL NEEDLE ASPIRATION BIOPSY  03/26/2021   Procedure: BRONCHIAL NEEDLE ASPIRATION BIOPSIES;  Surgeon: Margaretha Seeds, MD;  Location: [facility];  Service: Cardiopulmonary;;   BRONCHIAL WASHINGS  03/26/2021   Procedure: BRONCHIAL WASHINGS;  Surgeon: Margaretha Seeds, MD;  Location: [facility];  Service: Cardiopulmonary;;   ECTOPIC PREGNANCY SURGERY     ENDOBRONCHIAL ULTRASOUND Bilateral 03/26/2021   Procedure: ENDOBRONCHIAL ULTRASOUND;  Surgeon: Margaretha Seeds, MD;  Location: [facility];  Service: Cardiopulmonary;  Laterality: Bilateral;   HEMOSTASIS CONTROL  03/26/2021   Procedure: HEMOSTASIS CONTROL;  Surgeon: Margaretha Seeds, MD;  Location: [facility];  Service: Cardiopulmonary;;   HIP ARTHROPLASTY Right 10/13/2021   Procedure: ARTHROPLASTY BIPOLAR HIP (HEMIARTHROPLASTY);  Surgeon: Carole Civil, MD;  Location: [facility];  Service: Orthopedics;  Laterality: Right;   Patient Active Problem List   Diagnosis Date Noted   Malnutrition of moderate  degree 11/02/2021   Rt Sided Weakness due to Vasogenic Brain edema 10/31/2021   Tobacco abuse 10/31/2021   Closed displaced fracture of right femoral neck ([place]) 10/12/2021   Malignant neoplasm metastatic to brain [facility]) 03/27/2021   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 ([place]) 03/26/2021   Acute encephalopathy 03/25/2021   Primary cancer of left lower lobe of lung ([place]) 01/14/2020   CLOSED FRACTURE OF UNSPECIFIED PART OF HUMERUS 04/01/2010    PCP: no PCP  REFERRING PROVIDER: Carole Civil, MD   REFERRING DIAG: S72.001A (ICD-10-CM) - Closed displaced fracture of right femoral neck ([place])   THERAPY DIAG:  Other abnormalities of gait and mobility  Other symptoms and signs involving the musculoskeletal system  Pain in right hip  Muscle weakness (generalized)  Rationale for Evaluation and Treatment Rehabilitation  ONSET DATE: 10/13/21  SUBJECTIVE:   SUBJECTIVE STATEMENT: Pt states that she got her times confused she was supposed to be here this afternoon.  She states that her pain has been much better. She has been washing her dishes at the sink.  PERTINENT HISTORY: Lung cancer with mets to brain  PAIN:  Are you having pain? No  PRECAUTIONS: Other: Direct lateral hip precautions  WEIGHT BEARING RESTRICTIONS No  FALLS:  Has patient fallen in last 6 months? Yes. Number of falls 1  LIVING ENVIRONMENT: Lives with: lives with their daughter Lives in: House/apartment Stairs: Yes: External: 1 steps; none Has following equipment at home: Gilford Rile - 2  wheeled, Wheelchair (manual), bed side commode, and Grab bars  OCCUPATION: Retired  PLOF: [employer] get well and back to her life   OBJECTIVE:   PATIENT SURVEYS:  FOTO 36% function   LOWER EXTREMITY MMT:  MMT Right eval Left eval  Hip flexion 4 4+  Hip extension    Hip abduction    Hip adduction    Hip internal rotation    Hip external rotation    Knee flexion 4+  5  Knee extension 4 5  Ankle dorsiflexion 4+ 5  Ankle plantarflexion    Ankle inversion    Ankle eversion     (Blank rows = not tested)   FUNCTIONAL TESTS:  5 times sit to stand: 21.11 seconds with bilateral UE use on RW 2 minute walk test: 100 feet with RW Transfers: labored, bilateral UE use, relies LLE  GAIT: Distance walked: 100 Assistive device utilized: Environmental consultant - 2 wheeled Level of assistance: CGA Comments: 2MWT, slow, labored, R knee flexed in stance with antalgic gait, decreased R stance phase, unsteady turnings with body coming outside or RW    TODAY'S TREATMENT: 12/08/2021 Wall arch x 10 Heel raises x 10 Single leg stance 5" x 3 B           Gait with cane x 226           4 steps x 3 reps reciprocal            Sit to stand x 10               12/01/21 LAQ 2# 3x10 Sit to stand no UE support 2 x 10   Standing in // bars: Hip abduction 2# 2 x 10 Hip extension 2# 2 x 10 Hip flexion 2# 2x10 Heel raise 2 x 10 Toe raise 2 x 10 Step up 6" 2 x 10 HHA x 2   Forward gait in // bars with fading UE support, just enough to eliminate trendelenburg and stabilize: x 4RT Gait training with SPC in and out of // bars with cues for sequencing.  11/25/21 Review of HEP and goals Seated: LAQ's 5 sec hold x 10 Heel/toe raises x 10 Hip adduction ball squeeze 5" x 10  Sit to stand no UE assist 2 x 5  Standing: // bars Heel raises 2 x 10 Mini squats  2 x 10 Marching 2 x 10 Side stepping length of bars x 3 down and back  Supine: SLR x 10 Bridge 2 x 10   PATIENT EDUCATION:  Education details: Patient educated on exam findings, POC, scope of PT, HEP, and proper RW use. Person educated: Patient Education method: Explanation, Demonstration, and Handouts Education comprehension: verbalized understanding, returned demonstration, verbal cues required, and tactile cues required   HOME EXERCISE PROGRAM: Access Code: [email or web address]  12/01/21 - Heel Raises with Counter Support  - 3  x daily - 7 x weekly - 2 sets - 10 reps - Toe Raises with Counter Support  - 3 x daily - 7 x weekly - 2 sets - 10 reps - Standing Tandem Balance with Counter Support  - 3 x daily - 7 x weekly - 1 sets - 4 reps - 20-30 second hold  Date: 11/23/2021 - Seated Long Arc Quad  - 3 x daily - 7 x weekly - 2 sets - 10 reps - 5 second hold - Supine Bridge  - 3 x daily - 7 x weekly - 2 sets - 10 reps -  Supine Straight Leg Raises (Mirrored)  - 3 x daily - 7 x weekly - 1 sets - 10 reps  ASSESSMENT:  CLINICAL IMPRESSION: Pt showed at 10:00 for a 2:30 PM appointment, therefore, treatment was abbreviated.  Worked on gt training with cane, reciprocal steps and balance this session.  Pt will continue to need skilled care to improve these areas. OBJECTIVE IMPAIRMENTS Abnormal gait, decreased activity tolerance, decreased balance, decreased endurance, decreased mobility, difficulty walking, decreased ROM, decreased strength, increased muscle spasms, impaired flexibility, improper body mechanics, and pain.   ACTIVITY LIMITATIONS carrying, lifting, bending, standing, squatting, stairs, transfers, locomotion level, and caring for others  PARTICIPATION LIMITATIONS: meal prep, cleaning, laundry, shopping, community activity, and yard work  Independence, Time since onset of injury/illness/exacerbation, and 1-2 comorbidities: lung cancer with mets to brain, OA  are also affecting patient's functional outcome.   REHAB POTENTIAL: Good  CLINICAL DECISION MAKING: Evolving/moderate complexity  EVALUATION COMPLEXITY: Moderate   GOALS: Goals reviewed with patient? Yes  SHORT TERM GOALS: Target date: 12/21/2021  Patient will be independent with HEP in order to improve functional outcomes. Baseline:  Goal status: ongoing  2.  Patient will report at least 25% improvement in symptoms for improved quality of life. Baseline:  Goal status: ongoing   LONG TERM GOALS: Target date: 01/18/2022  Patient  will report at least 75% improvement in symptoms for improved quality of life. Baseline:  Goal status: ongoing  2.  Patient will improve FOTO score by at least 26 points in order to indicate improved tolerance to activity. Baseline: 36% function Goal status: ongoing  3.  Patient will be able to navigate stairs with reciprocal pattern without compensation in order to demonstrate improved LE strength. Baseline: unable Goal status: ongoing  4.  Patient will be able to ambulate at least 226 feet in 2MWT in order to demonstrate improved tolerance to activity. Baseline: 100 feet with RW Goal status:ongoing  5.  Patient will demonstrate grade of 5/5 MMT grade in all tested musculature as evidence of improved strength to assist with stair ambulation and gait.   Baseline: see MMT Goal status: ongoing  PLAN: PT FREQUENCY: 2x/week  PT DURATION: 8 weeks  PLANNED INTERVENTIONS: Therapeutic exercises, Therapeutic activity, Neuromuscular re-education, Balance training, Gait training, Patient/Family education, Joint manipulation, Joint mobilization, Stair training, Orthotic/Fit training, DME instructions, Aquatic Therapy, Dry Needling, Electrical stimulation, Spinal manipulation, Spinal mobilization, Cryotherapy, Moist heat, Compression bandaging, scar mobilization, Splintting, Taping, Traction, Ultrasound, Ionotophoresis 4mg /ml Dexamethasone, and Manual therapy  PLAN FOR NEXT SESSION: Hip and RLE strength, balance and gait training,  functional strength when able Rayetta Humphrey, PT CLT [phone]  10:37 AM, 12/08/21

## 2021-12-09 ENCOUNTER — Ambulatory Visit: Payer: Self-pay | Admitting: Urology

## 2021-12-10 ENCOUNTER — Telehealth: Payer: Self-pay | Admitting: Physical Therapy

## 2021-12-10 ENCOUNTER — Ambulatory Visit: Payer: Medicare Other | Attending: Orthopedic Surgery | Admitting: Physical Therapy

## 2021-12-10 ENCOUNTER — Encounter (HOSPITAL_COMMUNITY): Payer: Medicare Other | Admitting: Physical Therapy

## 2021-12-10 NOTE — Telephone Encounter (Signed)
Attempted to call pt at preferred number re: no show this AM - went to voicemail, unable to leave message as mail box full.

## 2021-12-14 ENCOUNTER — Encounter: Payer: Self-pay | Admitting: Urology

## 2021-12-14 NOTE — Progress Notes (Signed)
Telephone appointment. I verified patient's identity and began nursing interview. Patient denies any issues/ discomfort at this time.  Meaningful use complete.  Reminded patient of her 9:30am-12/15/21 telephone appointment w/ [name] Bruning PA-C. I left my extension [phone] in case patient needs anything. Patient verbalized understanding.  Patient contact [phone]- Daughter Thurston Pounds

## 2021-12-15 ENCOUNTER — Encounter: Payer: Self-pay | Admitting: Internal Medicine

## 2021-12-15 ENCOUNTER — Ambulatory Visit
Admission: RE | Admit: 2021-12-15 | Discharge: 2021-12-15 | Disposition: A | Discharge status: Home / Self Care | Payer: Medicare Other | Source: Ambulatory Visit | Attending: Urology | Admitting: Urology

## 2021-12-15 ENCOUNTER — Encounter (HOSPITAL_COMMUNITY): Payer: Medicare Other | Admitting: Physical Therapy

## 2021-12-15 DIAGNOSIS — C3490 Malignant neoplasm of unspecified part of unspecified bronchus or lung: Secondary | ICD-10-CM

## 2021-12-15 NOTE — Progress Notes (Signed)
  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  Plan: The patient has completed radiation treatment. The patient will return to radiation oncology clinic for routine followup in one month. I advised them to call or return sooner if they have any questions or concerns related to their recovery or treatment. ________________________________  Sheral Apley. Tammi Klippel, M.D.

## 2021-12-15 NOTE — Progress Notes (Signed)
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  The patient was notified in advance and gave permission to proceed with this visit format.  She tolerated the SRS brain treatments well, without any acute ill side effects.                              On review of systems, the patient states that she is doing well in general.  She specifically denies headaches, dizziness, imbalance, tremors, seizure activity, focal weakness or difficulty with speech.  She feels like she is getting stronger every day and overall, is pleased with her progress to date.  ALLERGIES:  has No Known Allergies.  Meds: Current Outpatient Medications  Medication Sig Dispense Refill   dexamethasone (DECADRON) 4 MG tablet 1 tab four times daily for 7 days, then 1 tab three times daily for 7 days, then 1 tab twice daily for 7 days, then 1 tab daily for 7 days, then 1/2 tab daily for 7 days, then stop 80 tablet 0   levETIRAcetam (KEPPRA) 500 MG tablet Take 1 tablet (500 mg total) by mouth 2 (two) times daily. 60 tablet 2   pantoprazole (PROTONIX) 40 MG tablet Take 1 tablet (40 mg total) by mouth daily. 30 tablet 2   No current facility-administered medications for this encounter.     Physical Findings:  vitals were not taken for this visit.  Pain Assessment Pain Score: 0-No pain/10 Unable to assess due to telephone follow-up visit format.  Lab Findings: Lab Results  Component Value Date   WBC 5.4 11/01/2021   HGB 9.9 (L) 11/01/2021   HCT 30.3 (L) 11/01/2021   MCV 82.6 11/01/2021   PLT 621 (H) 11/01/2021     Radiographic Findings: No results found.  Impression/Plan: 33. 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 She appears to have recovered well from the effects of her recent SRS brain treatments and is currently without complaints.  We discussed the plan to obtain a follow-up MRI brain scan in November 2023, to assess her treatment response and pending this scan is stable, we will then continue with serial MRI brain scans every 3 months going forward to monitor for any evidence of disease progression or recurrence.  She is not interested in any follow-up with medical oncology as she is adamant that she is not interested in any systemic therapy for treatment of her disease.  She has been taking her Decadron (4 mg p.o. twice daily) sporadically and still has approximately 2 weeks of medication remaining  if she were to continue taking this twice daily.  However, since it has already been a month since completing treatment, we will begin weaning down to 4 mg daily for 1 week and then 2 mg daily for 1 week and then discontinue use.  I will plan to connect with her by telephone following her next scan to review results and any recommendations from the multidisciplinary brain conference but she knows that she is welcome to call at anytime in the interim with any questions or concerns related to her previous radiation.    Nicholos Johns, PA-C

## 2021-12-16 ENCOUNTER — Other Ambulatory Visit: Payer: Self-pay | Admitting: Orthopedic Surgery

## 2021-12-16 ENCOUNTER — Other Ambulatory Visit: Payer: Self-pay

## 2021-12-16 ENCOUNTER — Ambulatory Visit: Payer: Medicare Other | Admitting: Urology

## 2021-12-16 DIAGNOSIS — S72001A Fracture of unspecified part of neck of right femur, initial encounter for closed fracture: Secondary | ICD-10-CM

## 2021-12-16 MED ORDER — HYDROCODONE-ACETAMINOPHEN 5-325 MG PO TABS: 1.0000 | ORAL_TABLET | Freq: Three times a day (TID) | ORAL | 0 refills | Status: DC | PRN

## 2021-12-16 NOTE — Progress Notes (Signed)
Med change   Meds ordered this encounter  Medications   HYDROcodone-acetaminophen (NORCO/VICODIN) 5-325 MG tablet    Sig: Take 1 tablet by mouth every 8 (eight) hours as needed for moderate pain.    Dispense:  30 tablet    Refill:  0

## 2021-12-16 NOTE — Telephone Encounter (Signed)
Oxycodone-Acetaminophen 5/325 MG  Qty 28 Tablets  Take 1 tablet by mouth every 6 (six) hours as needed for up to 7 days for severe pain.  PATIENT USES Sandra Horne

## 2021-12-17 ENCOUNTER — Encounter (HOSPITAL_COMMUNITY): Payer: Medicare Other | Admitting: Physical Therapy

## 2021-12-18 ENCOUNTER — Other Ambulatory Visit: Payer: Self-pay | Admitting: Radiation Oncology

## 2021-12-22 ENCOUNTER — Encounter (HOSPITAL_COMMUNITY): Payer: Medicare Other | Admitting: Physical Therapy

## 2021-12-24 ENCOUNTER — Encounter (HOSPITAL_COMMUNITY): Payer: Medicare Other | Admitting: Physical Therapy

## 2021-12-26 ENCOUNTER — Encounter (HOSPITAL_COMMUNITY): Payer: Self-pay

## 2021-12-26 ENCOUNTER — Other Ambulatory Visit: Payer: Self-pay

## 2021-12-26 ENCOUNTER — Emergency Department (HOSPITAL_COMMUNITY): Payer: Medicare Other

## 2021-12-26 ENCOUNTER — Emergency Department (HOSPITAL_COMMUNITY)
Admission: EM | Admit: 2021-12-26 | Discharge: 2021-12-26 | Disposition: A | Discharge status: Home / Self Care | Payer: Medicare Other | Attending: Emergency Medicine | Admitting: Emergency Medicine

## 2021-12-26 DIAGNOSIS — B371 Pulmonary candidiasis: Secondary | ICD-10-CM | POA: Diagnosis not present

## 2021-12-26 DIAGNOSIS — R000 Tachycardia, unspecified: Secondary | ICD-10-CM | POA: Diagnosis not present

## 2021-12-26 DIAGNOSIS — M25551 Pain in right hip: Secondary | ICD-10-CM | POA: Diagnosis not present

## 2021-12-26 DIAGNOSIS — Z85841 Personal history of malignant neoplasm of brain: Secondary | ICD-10-CM | POA: Diagnosis not present

## 2021-12-26 DIAGNOSIS — W08XXXA Fall from other furniture, initial encounter: Secondary | ICD-10-CM | POA: Diagnosis not present

## 2021-12-26 DIAGNOSIS — R42 Dizziness and giddiness: Secondary | ICD-10-CM | POA: Diagnosis not present

## 2021-12-26 DIAGNOSIS — Z85118 Personal history of other malignant neoplasm of bronchus and lung: Secondary | ICD-10-CM | POA: Diagnosis not present

## 2021-12-26 DIAGNOSIS — R531 Weakness: Secondary | ICD-10-CM | POA: Insufficient documentation

## 2021-12-26 DIAGNOSIS — C3490 Malignant neoplasm of unspecified part of unspecified bronchus or lung: Secondary | ICD-10-CM | POA: Diagnosis not present

## 2021-12-26 DIAGNOSIS — R52 Pain, unspecified: Secondary | ICD-10-CM | POA: Diagnosis not present

## 2021-12-26 DIAGNOSIS — I878 Other specified disorders of veins: Secondary | ICD-10-CM | POA: Diagnosis not present

## 2021-12-26 LAB — CBC WITH DIFFERENTIAL/PLATELET
Abs Immature Granulocytes: 0.24 10*3/uL — ABNORMAL HIGH (ref 0.00–0.07)
Basophils Absolute: 0.1 10*3/uL (ref 0.0–0.1)
Basophils Relative: 1 %
Eosinophils Absolute: 0.1 10*3/uL (ref 0.0–0.5)
Eosinophils Relative: 1 %
HCT: 34.5 % — ABNORMAL LOW (ref 36.0–46.0)
Hemoglobin: 11.1 g/dL — ABNORMAL LOW (ref 12.0–15.0)
Immature Granulocytes: 3 %
Lymphocytes Relative: 18 %
Lymphs Abs: 1.5 10*3/uL (ref 0.7–4.0)
MCH: 28.5 pg (ref 26.0–34.0)
MCHC: 32.2 g/dL (ref 30.0–36.0)
MCV: 88.7 fL (ref 80.0–100.0)
Monocytes Absolute: 1.2 10*3/uL — ABNORMAL HIGH (ref 0.1–1.0)
Monocytes Relative: 15 %
Neutro Abs: 5.1 10*3/uL (ref 1.7–7.7)
Neutrophils Relative %: 62 %
Platelets: 384 10*3/uL (ref 150–400)
RBC: 3.89 MIL/uL (ref 3.87–5.11)
RDW: 18.3 % — ABNORMAL HIGH (ref 11.5–15.5)
WBC: 8.2 10*3/uL (ref 4.0–10.5)
nRBC: 0 % (ref 0.0–0.2)

## 2021-12-26 LAB — BASIC METABOLIC PANEL
Anion gap: 4 — ABNORMAL LOW (ref 5–15)
BUN: 6 mg/dL — ABNORMAL LOW (ref 8–23)
CO2: 25 mmol/L (ref 22–32)
Calcium: 8.3 mg/dL — ABNORMAL LOW (ref 8.9–10.3)
Chloride: 109 mmol/L (ref 98–111)
Creatinine, Ser: 0.64 mg/dL (ref 0.44–1.00)
GFR, Estimated: >60 mL/min (ref >60)
Glucose, Bld: 104 mg/dL — ABNORMAL HIGH (ref 70–99)
Potassium: 3.6 mmol/L (ref 3.5–5.1)
Sodium: 138 mmol/L (ref 135–145)

## 2021-12-26 MED ORDER — DEXAMETHASONE 4 MG PO TABS: 4.0000 mg | ORAL_TABLET | Freq: Two times a day (BID) | ORAL | 0 refills | Status: DC

## 2021-12-26 MED ORDER — OXYCODONE-ACETAMINOPHEN 5-325 MG PO TABS: 1.0000 | ORAL_TABLET | Freq: Four times a day (QID) | ORAL | 0 refills | Status: DC | PRN

## 2021-12-26 MED ORDER — DEXAMETHASONE 4 MG PO TABS
4.0000 mg | ORAL_TABLET | Freq: Once | ORAL | Status: AC
  Administered 2021-12-26: 4 mg via ORAL
  Filled 2021-12-26: qty 1

## 2021-12-26 NOTE — ED Triage Notes (Signed)
Patient states she has had trouble walking since yesterday morning. Patient states she slipped off her stool and is now complaining about right upper leg pain. Patient had hip fx and surgery 4 months ago to right hip. Denies hitting her head.

## 2021-12-26 NOTE — ED Notes (Signed)
Patient sprite at this time.

## 2021-12-26 NOTE — Discharge Instructions (Signed)
Call your doctors Monday and let them know that you are in the emergency department and we have started you back on your Decadron.  Keep your appointment with Dr. Aline Brochure and your cancer doctor

## 2021-12-26 NOTE — ED Provider Notes (Signed)
[facility] EMERGENCY DEPARTMENT Provider Note   CSN: [id number] Arrival date & time: 12/26/21  3329     History {Add pertinent medical, surgical, social history, OB history to HPI:1} Chief Complaint  Patient presents with   Sandra Horne is a 66 y.o. female.  Patient has lung cancer with metastases to the brain.  She also had hip surgery 4 months ago.  Patient is complaining of pain in her right hip and worsening weakness in that hip.  Patient was on Decadron up till 10 days ago   Weakness      Home Medications Prior to Admission medications   Medication Sig Start Date End Date Taking? Authorizing Provider  dexamethasone (DECADRON) 4 MG tablet Take 1 tablet (4 mg total) by mouth 2 (two) times daily with a meal. 12/26/21  Yes Milton Ferguson, MD  oxyCODONE-acetaminophen (PERCOCET/ROXICET) 5-325 MG tablet Take 1 tablet by mouth every 6 (six) hours as needed for severe pain. 12/26/21  Yes Milton Ferguson, MD  HYDROcodone-acetaminophen (NORCO/VICODIN) 5-325 MG tablet Take 1 tablet by mouth every 8 (eight) hours as needed for moderate pain. 12/16/21   Carole Civil, MD  levETIRAcetam (KEPPRA) 500 MG tablet Take 1 tablet (500 mg total) by mouth 2 (two) times daily. 11/03/21 02/01/22  Barb Merino, MD  pantoprazole (PROTONIX) 40 MG tablet Take 1 tablet by mouth once daily 12/18/21   Tyler Pita, MD      Allergies    Patient has no known allergies.    Review of Systems   Review of Systems  Neurological:  Positive for weakness.    Physical Exam Updated Vital Signs BP 133/69   Pulse 87   Temp 98.4 F (36.9 C) (Oral)   Resp 19   Ht 5\' 4"  (1.626 m)   Wt 68 kg   SpO2 99%   BMI 25.75 kg/m  Physical Exam  ED Results / Procedures / Treatments   Labs (all labs ordered are listed, but only abnormal results are displayed) Labs Reviewed  CBC WITH DIFFERENTIAL/PLATELET - Abnormal; Notable for the following components:      Result Value   Hemoglobin 11.1 (*)     HCT 34.5 (*)    RDW 18.3 (*)    Monocytes Absolute 1.2 (*)    Abs Immature Granulocytes 0.24 (*)    All other components within normal limits  BASIC METABOLIC PANEL - Abnormal; Notable for the following components:   Glucose, Bld 104 (*)    BUN 6 (*)    Calcium 8.3 (*)    Anion gap 4 (*)    All other components within normal limits    EKG None  Radiology CT Head Wo Contrast  Result Date: 12/26/2021 CLINICAL DATA:  History of metastatic lung carcinoma.  Dizziness. EXAM: CT HEAD WITHOUT CONTRAST TECHNIQUE: Contiguous axial images were obtained from the base of the skull through the vertex without intravenous contrast. RADIATION DOSE REDUCTION: This exam was performed according to the departmental dose-optimization program which includes automated exposure control, adjustment of the mA and/or kV according to patient size and/or use of iterative reconstruction technique. COMPARISON:  Head CT and brain MRI, 10/31/2021. FINDINGS: Brain: Cystic mass in the left parietal lobe, 2.7 cm in greatest dimension measured on the coronal sequence, previously 3.8 cm. Diffuse surrounding vasogenic edema and associated mass effect. Midline shift to the right, 8-9 mm, without significant change. Partial effacement of the left lateral ventricle, also unchanged. Small area of edema now noted along  the posterosuperior left frontal lobe, not evident on the previous CT, only a small focus of increased T2 signal noted in this location on the prior MRI. This may reflect a new or enlarged brain lesion. Edema in the left occipital lobe is similar to the prior CT. Right temporal lobe edema has significantly improved compared to the prior CT. No evidence of an infarct. No hydrocephalus. No intracranial hemorrhage. No extra-axial masses or abnormal fluid collections. Vascular: No hyperdense vessel or unexpected calcification. Skull: Normal. Negative for fracture or focal lesion. Sinuses/Orbits: Globes and orbits are  unremarkable. Visualized sinuses are clear. Other: None. IMPRESSION: 1. No acute intracranial abnormalities. No evidence of an infarct or of intracranial hemorrhage. 2. Findings of metastatic disease. Focal area of hypoattenuation consistent with vasogenic edema along the superior, posterior and medial aspect of the right frontal lobe has developed since the previous head CT concerning for a new or worsened metastatic lesion. 3. Left parietal cystic mass is smaller. Right parietal lobe vasogenic edema has significantly improved. Electronically Signed   By: Lajean Manes M.D.   On: 12/26/2021 09:17   DG Hip Unilat W or Wo Pelvis 2-3 Views Right  Result Date: 12/26/2021 CLINICAL DATA:  Pain EXAM: DG HIP (WITH OR WITHOUT PELVIS) 3V RIGHT COMPARISON:  11/08/2021 FINDINGS: Right hip arthroplasty is located. No evidence of periprosthetic fracture. Chronic metallic fragments inferior to the right hip. The pelvic ring is intact. Pelvic phleboliths. IMPRESSION: No acute or interval finding. Electronically Signed   By: Jorje Guild M.D.   On: 12/26/2021 08:57    Procedures Procedures  {Document cardiac monitor, telemetry assessment procedure when appropriate:1}  Medications Ordered in ED Medications  dexamethasone (DECADRON) tablet 4 mg (has no administration in time range)    ED Course/ Medical Decision Making/ A&P  CT scan of the head show worsening of metastatic disease.  I spoke with Dr. Delton Coombes and we will start back Decadron to 4 mg twice a day.  She will follow-up with Dr. Aline Brochure and get in touch with her cancer doctor Monday                         Medical Decision Making Amount and/or Complexity of Data Reviewed Labs: ordered. Radiology: ordered.  Risk Prescription drug management.  Patient with progressive weakness in her right leg and worsening metastatic disease to the brain.  Patient is started on Decadron and Percocet  {Document critical care time when  appropriate:1} {Document review of labs and clinical decision tools ie heart score, Chads2Vasc2 etc:1}  {Document your independent review of radiology images, and any outside records:1} {Document your discussion with family members, caretakers, and with consultants:1} {Document social determinants of health affecting pt's care:1} {Document your decision making why or why not admission, treatments were needed:1} Final Clinical Impression(s) / ED Diagnoses Final diagnoses:  Candidiasis of lung ([place])    Rx / DC Orders ED Discharge Orders          Ordered    dexamethasone (DECADRON) 4 MG tablet  2 times daily with meals        12/26/21 1232    oxyCODONE-acetaminophen (PERCOCET/ROXICET) 5-325 MG tablet  Every 6 hours PRN        12/26/21 1232

## 2021-12-28 ENCOUNTER — Other Ambulatory Visit: Payer: Self-pay | Admitting: Radiation Oncology

## 2021-12-29 ENCOUNTER — Encounter (HOSPITAL_COMMUNITY): Payer: Medicare Other | Admitting: Physical Therapy

## 2021-12-31 ENCOUNTER — Other Ambulatory Visit: Payer: Self-pay | Admitting: Radiation Therapy

## 2021-12-31 ENCOUNTER — Telehealth: Payer: Self-pay | Admitting: Radiation Therapy

## 2021-12-31 ENCOUNTER — Encounter (HOSPITAL_COMMUNITY): Payer: Medicare Other | Admitting: Physical Therapy

## 2021-12-31 ENCOUNTER — Encounter: Payer: Self-pay | Admitting: Internal Medicine

## 2021-12-31 DIAGNOSIS — C7931 Secondary malignant neoplasm of brain: Secondary | ICD-10-CM

## 2021-12-31 NOTE — Telephone Encounter (Signed)
Spoke with daughter about an MRI on 10/8 @ [facility]. I instructed them to check in at the ED department at 11:30. She said she will have Sandra Horne there.  Mont Dutton R.T.(R)(T) Radiation Special Procedures Navigator

## 2022-01-03 ENCOUNTER — Ambulatory Visit (HOSPITAL_COMMUNITY)
Admission: RE | Admit: 2022-01-03 | Discharge: 2022-01-03 | Disposition: A | Discharge status: Home / Self Care | Payer: Medicare Other | Source: Ambulatory Visit | Attending: Radiation Oncology | Admitting: Radiation Oncology

## 2022-01-03 DIAGNOSIS — C7931 Secondary malignant neoplasm of brain: Secondary | ICD-10-CM | POA: Insufficient documentation

## 2022-01-03 DIAGNOSIS — G9389 Other specified disorders of brain: Secondary | ICD-10-CM | POA: Diagnosis not present

## 2022-01-03 MED ORDER — GADOBUTROL 1 MMOL/ML IV SOLN
6.8000 mL | Freq: Once | INTRAVENOUS | Status: AC | PRN
  Administered 2022-01-03: 6.8 mL via INTRAVENOUS

## 2022-01-04 ENCOUNTER — Inpatient Hospital Stay: Payer: Medicare Other | Attending: Physician Assistant

## 2022-01-04 DIAGNOSIS — Z993 Dependence on wheelchair: Secondary | ICD-10-CM | POA: Insufficient documentation

## 2022-01-04 DIAGNOSIS — C7951 Secondary malignant neoplasm of bone: Secondary | ICD-10-CM | POA: Insufficient documentation

## 2022-01-04 DIAGNOSIS — C3492 Malignant neoplasm of unspecified part of left bronchus or lung: Secondary | ICD-10-CM | POA: Insufficient documentation

## 2022-01-04 DIAGNOSIS — F17210 Nicotine dependence, cigarettes, uncomplicated: Secondary | ICD-10-CM | POA: Insufficient documentation

## 2022-01-05 ENCOUNTER — Encounter (HOSPITAL_COMMUNITY): Payer: Medicare Other | Admitting: Physical Therapy

## 2022-01-05 ENCOUNTER — Encounter: Payer: Self-pay | Admitting: Urology

## 2022-01-05 ENCOUNTER — Telehealth: Payer: Self-pay

## 2022-01-05 ENCOUNTER — Encounter: Payer: Self-pay | Admitting: Internal Medicine

## 2022-01-05 ENCOUNTER — Ambulatory Visit
Admission: RE | Admit: 2022-01-05 | Discharge: 2022-01-05 | Disposition: A | Discharge status: Home / Self Care | Payer: Medicare Other | Source: Ambulatory Visit | Attending: Urology | Admitting: Urology

## 2022-01-05 ENCOUNTER — Other Ambulatory Visit: Payer: Self-pay | Admitting: Radiation Therapy

## 2022-01-05 DIAGNOSIS — Z87891 Personal history of nicotine dependence: Secondary | ICD-10-CM | POA: Diagnosis not present

## 2022-01-05 DIAGNOSIS — C3432 Malignant neoplasm of lower lobe, left bronchus or lung: Secondary | ICD-10-CM | POA: Diagnosis not present

## 2022-01-05 DIAGNOSIS — C3490 Malignant neoplasm of unspecified part of unspecified bronchus or lung: Secondary | ICD-10-CM

## 2022-01-05 DIAGNOSIS — C7931 Secondary malignant neoplasm of brain: Secondary | ICD-10-CM | POA: Diagnosis not present

## 2022-01-05 NOTE — Telephone Encounter (Signed)
Called in reference to completing the nursing portion of patient's 1:00pm-01/05/22 telephone appt w/ [name] Bruning PA-C. No message option available. Voicemail full.

## 2022-01-05 NOTE — Progress Notes (Signed)
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 Patient reports that her walking, and headaches have improved overall. No issues to report at this time.  Meaningful use complete. No chances of pregnancy.  Patient aware of her 1:00pm-01/05/22 telephone appointment w/ [name] Bruning PA-C. I left my extension [phone] in case patient needs anything. Patient verbalized understanding. This concludes the nursing interview.  Patient contact [phone]
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
## 2022-01-06 ENCOUNTER — Inpatient Hospital Stay: Payer: Medicare Other | Admitting: Nurse Practitioner

## 2022-01-06 ENCOUNTER — Encounter: Payer: Medicare Other | Admitting: Orthopedic Surgery

## 2022-01-06 ENCOUNTER — Ambulatory Visit: Payer: Medicare Other

## 2022-01-06 ENCOUNTER — Ambulatory Visit: Payer: Medicare Other | Admitting: Radiation Oncology

## 2022-01-07 ENCOUNTER — Encounter (HOSPITAL_COMMUNITY): Payer: Medicare Other | Admitting: Physical Therapy

## 2022-01-08 ENCOUNTER — Ambulatory Visit
Admission: RE | Admit: 2022-01-08 | Discharge: 2022-01-08 | Disposition: A | Discharge status: Home / Self Care | Payer: Medicare Other | Source: Ambulatory Visit | Attending: Radiation Oncology | Admitting: Radiation Oncology

## 2022-01-08 ENCOUNTER — Inpatient Hospital Stay: Payer: Medicare Other

## 2022-01-08 ENCOUNTER — Other Ambulatory Visit: Payer: Self-pay

## 2022-01-08 ENCOUNTER — Encounter: Payer: Self-pay | Admitting: Radiation Oncology

## 2022-01-08 ENCOUNTER — Inpatient Hospital Stay: Payer: Medicare Other | Admitting: Nurse Practitioner

## 2022-01-08 VITALS — BP 122/81 | HR 76 | Temp 97.7°F | Resp 20 | Ht 64.0 in | Wt 143.0 lb

## 2022-01-08 DIAGNOSIS — C7931 Secondary malignant neoplasm of brain: Secondary | ICD-10-CM | POA: Diagnosis not present

## 2022-01-08 DIAGNOSIS — Z87891 Personal history of nicotine dependence: Secondary | ICD-10-CM | POA: Diagnosis not present

## 2022-01-08 DIAGNOSIS — C3490 Malignant neoplasm of unspecified part of unspecified bronchus or lung: Secondary | ICD-10-CM | POA: Insufficient documentation

## 2022-01-08 DIAGNOSIS — C3432 Malignant neoplasm of lower lobe, left bronchus or lung: Secondary | ICD-10-CM | POA: Diagnosis not present

## 2022-01-08 MED ORDER — SODIUM CHLORIDE 0.9% FLUSH
10.0000 mL | INTRAVENOUS | Status: DC | PRN
  Administered 2022-01-08: 10 mL via INTRAVENOUS

## 2022-01-08 NOTE — Progress Notes (Signed)
Has armband been applied?  Yes.    Does patient have an allergy to IV contrast dye?: No.   Has patient ever received premedication for IV contrast dye?: No.   Does patient take metformin?: No.  If patient does take metformin when was the last dose: None  Date of lab work: December 26, 2021 BUN: 6   CR: 0.64  IV site: antecubital right, condition patent and no redness  Has IV site been added to flowsheet?  Yes.     BP 122/81 (BP Location: Right Arm, Patient Position: Sitting, Cuff Size: Normal)   Pulse 76   Temp 97.7 F (36.5 C) (Oral)   Resp 20   Ht 5\' 4"  (1.626 m)   Wt 143 lb (64.9 kg)   SpO2 100%   BMI 24.55 kg/m

## 2022-01-08 NOTE — Progress Notes (Signed)
  Radiation Oncology         [phone] ________________________________  Name: KRYSTAN NORTHROP MRN: [id number]  Date: 01/08/2022  DOB: 02/19/1956  SIMULATION AND TREATMENT PLANNING NOTE    ICD-10-CM   1. Malignant neoplasm metastatic to brain ([place])  C79.31 sodium chloride flush (NS) 0.9 % injection 10 mL    2. 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  Identity was confirmed.  All relevant records and images related to the planned course of therapy were reviewed.  The patient freely provided informed written consent to proceed with treatment after reviewing the details related to the planned course of therapy. The consent form was witnessed and verified by the simulation staff. Intravenous access was established for contrast administration. Then, the patient was set-up in a stable reproducible supine position for radiation therapy.  A relocatable thermoplastic stereotactic head frame was fabricated for precise immobilization.  CT images were obtained.  Surface markings were placed.  The CT images were loaded into the planning software and fused with the patient's targeting MRI scan.  Then the target and avoidance structures were contoured.  Treatment planning then occurred.  The radiation prescription was entered and confirmed.  I have requested 3D planning  I have requested a DVH of the following structures: Brain stem, brain, left eye, right eye, lenses, optic chiasm, target volumes, uninvolved brain, and normal tissue.    SPECIAL TREATMENT PROCEDURE:  The planned course of therapy using radiation constitutes a special treatment procedure. Special care is required in the management of this patient for the following reasons. This  treatment constitutes a Special Treatment Procedure for the following reason: High dose per fraction requiring special monitoring for increased toxicities of treatment including daily imaging.  The special nature of the planned course of radiotherapy will require increased physician supervision and oversight to ensure patient's safety with optimal treatment outcomes.  This requires extended time and effort.  PLAN:  The patient will receive 20 Gy in 1 fraction to each new lesion.  ________________________________  Sheral Apley Tammi Klippel, M.D.

## 2022-01-11 DIAGNOSIS — Z87891 Personal history of nicotine dependence: Secondary | ICD-10-CM | POA: Diagnosis not present

## 2022-01-11 DIAGNOSIS — C7931 Secondary malignant neoplasm of brain: Secondary | ICD-10-CM | POA: Diagnosis not present

## 2022-01-11 DIAGNOSIS — C3432 Malignant neoplasm of lower lobe, left bronchus or lung: Secondary | ICD-10-CM | POA: Diagnosis not present

## 2022-01-12 ENCOUNTER — Encounter (HOSPITAL_COMMUNITY): Payer: Medicare Other | Admitting: Physical Therapy

## 2022-01-12 ENCOUNTER — Ambulatory Visit: Payer: Medicare Other | Admitting: Radiation Oncology

## 2022-01-13 ENCOUNTER — Ambulatory Visit (INDEPENDENT_AMBULATORY_CARE_PROVIDER_SITE_OTHER): Payer: Medicare Other

## 2022-01-13 ENCOUNTER — Other Ambulatory Visit: Payer: Self-pay

## 2022-01-13 ENCOUNTER — Inpatient Hospital Stay: Payer: Medicare Other

## 2022-01-13 ENCOUNTER — Ambulatory Visit (INDEPENDENT_AMBULATORY_CARE_PROVIDER_SITE_OTHER): Payer: Medicare Other | Admitting: Orthopedic Surgery

## 2022-01-13 ENCOUNTER — Ambulatory Visit
Admission: RE | Admit: 2022-01-13 | Discharge: 2022-01-13 | Disposition: A | Discharge status: Home / Self Care | Payer: Medicare Other | Source: Ambulatory Visit | Attending: Radiation Oncology | Admitting: Radiation Oncology

## 2022-01-13 ENCOUNTER — Encounter: Payer: Self-pay | Admitting: Urology

## 2022-01-13 ENCOUNTER — Inpatient Hospital Stay (HOSPITAL_BASED_OUTPATIENT_CLINIC_OR_DEPARTMENT_OTHER): Payer: Medicare Other | Admitting: Nurse Practitioner

## 2022-01-13 ENCOUNTER — Encounter: Payer: Self-pay | Admitting: Orthopedic Surgery

## 2022-01-13 DIAGNOSIS — C7931 Secondary malignant neoplasm of brain: Secondary | ICD-10-CM | POA: Diagnosis not present

## 2022-01-13 DIAGNOSIS — C3490 Malignant neoplasm of unspecified part of unspecified bronchus or lung: Secondary | ICD-10-CM | POA: Diagnosis not present

## 2022-01-13 DIAGNOSIS — M545 Low back pain, unspecified: Secondary | ICD-10-CM | POA: Diagnosis not present

## 2022-01-13 DIAGNOSIS — C3432 Malignant neoplasm of lower lobe, left bronchus or lung: Secondary | ICD-10-CM | POA: Diagnosis not present

## 2022-01-13 DIAGNOSIS — S72001A Fracture of unspecified part of neck of right femur, initial encounter for closed fracture: Secondary | ICD-10-CM

## 2022-01-13 DIAGNOSIS — Z993 Dependence on wheelchair: Secondary | ICD-10-CM | POA: Diagnosis not present

## 2022-01-13 DIAGNOSIS — M5410 Radiculopathy, site unspecified: Secondary | ICD-10-CM | POA: Diagnosis not present

## 2022-01-13 DIAGNOSIS — F17210 Nicotine dependence, cigarettes, uncomplicated: Secondary | ICD-10-CM | POA: Diagnosis not present

## 2022-01-13 DIAGNOSIS — R530 Neoplastic (malignant) related fatigue: Secondary | ICD-10-CM | POA: Diagnosis not present

## 2022-01-13 DIAGNOSIS — Z7189 Other specified counseling: Secondary | ICD-10-CM

## 2022-01-13 DIAGNOSIS — Z515 Encounter for palliative care: Secondary | ICD-10-CM | POA: Diagnosis not present

## 2022-01-13 DIAGNOSIS — Z510 Encounter for antineoplastic radiation therapy: Secondary | ICD-10-CM | POA: Diagnosis not present

## 2022-01-13 DIAGNOSIS — R630 Anorexia: Secondary | ICD-10-CM | POA: Diagnosis not present

## 2022-01-13 DIAGNOSIS — C7951 Secondary malignant neoplasm of bone: Secondary | ICD-10-CM | POA: Diagnosis not present

## 2022-01-13 DIAGNOSIS — C3492 Malignant neoplasm of unspecified part of left bronchus or lung: Secondary | ICD-10-CM | POA: Diagnosis not present

## 2022-01-13 DIAGNOSIS — Z87891 Personal history of nicotine dependence: Secondary | ICD-10-CM | POA: Diagnosis not present

## 2022-01-13 LAB — RAD ONC ARIA SESSION SUMMARY
Course Elapsed Days: 0
Plan Fractions Treated to Date: 1
Plan Fractions Treated to Date: 1
Plan Prescribed Dose Per Fraction: 20 Gy
Plan Prescribed Dose Per Fraction: 20 Gy
Plan Total Fractions Prescribed: 1
Plan Total Fractions Prescribed: 1
Plan Total Prescribed Dose: 20 Gy
Plan Total Prescribed Dose: 20 Gy
Reference Point Dosage Given to Date: 20 Gy
Reference Point Dosage Given to Date: 20 Gy
Reference Point Session Dosage Given: 20 Gy
Reference Point Session Dosage Given: 20 Gy
Session Number: 1

## 2022-01-13 MED ORDER — GABAPENTIN 300 MG PO CAPS: 300.0000 mg | ORAL_CAPSULE | Freq: Three times a day (TID) | ORAL | 5 refills | Status: DC

## 2022-01-13 MED ORDER — METHOCARBAMOL 500 MG PO TABS: 500.0000 mg | ORAL_TABLET | Freq: Three times a day (TID) | ORAL | 1 refills | Status: DC

## 2022-01-13 MED ORDER — IBUPROFEN 800 MG PO TABS: 800.0000 mg | ORAL_TABLET | Freq: Three times a day (TID) | ORAL | 1 refills | Status: DC | PRN

## 2022-01-13 NOTE — Progress Notes (Signed)
Patient to nursing for 30 minute observation [facility] [place] [facility].  Alert and verbally responsive.  Denies headache, fatigue, and nausea.  Reports left eye mild vision changes and some ringing in ears.  Uses walker for ambulation.  Vitals:  97.9-72-18-146/83.  O2 sat 99%.  Patient will wait in nursing for her next appointment that is with palliative care.  Maygan Hodge RN & Alda Lea, NP with palliative are aware.
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
## 2022-01-13 NOTE — Patient Instructions (Addendum)
While we are working on your approval forCT please go ahead and call to schedule your appointment with [facility] within at least one (1) week.   Central Scheduling [phone]

## 2022-01-13 NOTE — Progress Notes (Signed)
  Radiation Oncology         [phone] ________________________________  Stereotactic Treatment Procedure Note  Name: Sandra Horne MRN: [id number]  Date: 01/13/2022  DOB: 14-Jun-1955  SPECIAL TREATMENT PROCEDURE    ICD-10-CM   1. Malignant neoplasm metastatic to brain ([place])  C79.31     2. 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 [facility])  C34.90    C79.31       3D TREATMENT PLANNING AND DOSIMETRY:  The patient's radiation plan was reviewed and approved by neurosurgery and radiation oncology prior to treatment.  It showed 3-dimensional radiation distributions overlaid onto the planning CT/MRI image set.  The [facility] for the target structures as well as the organs at risk were reviewed. The documentation of the 3D plan and dosimetry are filed in the radiation oncology EMR.  NARRATIVE:  Sandra Horne was brought to the True[employer] stereotactic radiation treatment machine and placed supine on the CT couch. The head frame was applied, and the patient was set up for stereotactic radiosurgery.  Neurosurgery was present for the set-up and delivery  SIMULATION VERIFICATION:  In the couch zero-angle position, the patient underwent Exactrac imaging using the Brainlab system with orthogonal KV images.  These were carefully aligned and repeated to confirm treatment position for each of the isocenters.  The Exactrac snap film verification was repeated at each couch angle.  PROCEDURE: [name] received stereotactic radiosurgery to the following targets: Right precentral 6 mm target was treated using 4 Dynamic Conformal Arcs to a prescription dose of 20 Gy.  ExacTrac registration was performed for each couch angle.  The 100% isodose line was prescribed.  6 MV X-rays were delivered in the flattening filter free beam mode. Right parietal 3 mm target was treated using 4 Dynamic Conformal Arcs to a prescription dose of 20 Gy.  ExacTrac registration was performed for each  couch angle.  The 100% isodose line was prescribed.  6 MV X-rays were delivered in the flattening filter free beam mode.  STEREOTACTIC TREATMENT MANAGEMENT:  Following delivery, the patient was transported to nursing in stable condition and monitored for possible acute effects.  Vital signs were recorded . The patient tolerated treatment without significant acute effects, and was discharged to home in stable condition.    PLAN: Follow-up in one month.  ________________________________  Sheral Apley. Tammi Klippel, M.D.

## 2022-01-14 ENCOUNTER — Encounter: Payer: Self-pay | Admitting: Nurse Practitioner

## 2022-01-14 ENCOUNTER — Encounter (HOSPITAL_COMMUNITY): Payer: Medicare Other | Admitting: Physical Therapy

## 2022-01-14 ENCOUNTER — Encounter: Payer: Self-pay | Admitting: Internal Medicine

## 2022-01-14 NOTE — Progress Notes (Signed)
[facility]  [phone] [phone]   Name: Sandra Horne Date: 01/14/2022 MRN: [id number]  DOB: [birth year]  Patient Care Team: System, Provider Not In as PCP - General Pickenpack-Cousar, Carlena Sax, NP as Nurse Practitioner (Nurse Practitioner)    INTERVAL HISTORY: Sandra Horne is a 66 y.o. female with oncologic medical history including primary lung CA (left infrahilar mass dx Sept 2021 but lost to f/u, last radiation treatment 10/29/2021), chronic anemia, right femoral neck fracture (s/p right hemiarthroplasty on 10/13/2021) and tobacco abuse. She was recently admitted on 10/31/2021 with new right-sided weakness. CT of head revealed left parietal lobe mass with vasogenic edema. She is currently undergoing SRS.  Palliative ask to see for symptom management and goals of care.   SOCIAL HISTORY:     reports that she has been smoking cigarettes. She has a 10.00 pack-year smoking history. She has never used smokeless tobacco. She reports that she does not currently use alcohol. She reports that she does not currently use drugs after having used the following drugs: Marijuana.  ADVANCE DIRECTIVES:    CODE STATUS:   PAST MEDICAL HISTORY: Past Medical History:  Diagnosis Date   Cancer ([place])    Lung and Brain mets    ALLERGIES:  has No Known Allergies.  MEDICATIONS:  Current Outpatient Medications  Medication Sig Dispense Refill   dexamethasone (DECADRON) 4 MG tablet Take 1 tablet (4 mg total) by mouth 2 (two) times daily with a meal. 60 tablet 0   gabapentin (NEURONTIN) 300 MG capsule Take 1 capsule (300 mg total) by mouth 3 (three) times daily. 90 capsule 5   ibuprofen (ADVIL) 800 MG tablet Take 1 tablet (800 mg total) by mouth every 8 (eight) hours as needed. 90 tablet 1   levETIRAcetam (KEPPRA) 500 MG tablet Take 1 tablet (500 mg total) by mouth 2 (two) times daily. (Patient not taking: Reported on 01/13/2022) 60 tablet  2   methocarbamol (ROBAXIN) 500 MG tablet Take 1 tablet (500 mg total) by mouth 3 (three) times daily. 60 tablet 1   pantoprazole (PROTONIX) 40 MG tablet Take 1 tablet by mouth once daily (Patient not taking: Reported on 01/13/2022) 30 tablet 0   No current facility-administered medications for this visit.    VITAL SIGNS: There were no vitals taken for this visit. There were no vitals filed for this visit.  Estimated body mass index is 24.55 kg/m as calculated from the following:   Height as of 01/08/22: 5\' 4"  (1.626 m).   Weight as of 01/08/22: 143 lb (64.9 kg).   PERFORMANCE STATUS (ECOG) : 1 - Symptomatic but completely ambulatory   Physical Exam General: NAD, in wheelchair  Cardiovascular: regular rate and rhythm Extremities: no edema, no joint deformities Skin: no rashes Neurological: AAO X3  IMPRESSION: I saw Sandra Horne during her radiation appointment. She is in a wheelchair. No family present. She is still staying with her daughter for ongoing support. States she is ready to return back to her own home with her husband. Feels she has gotten somewhat stronger and appreciative of the strong family support that she has.   She received SRS treatment today. Tolerated well. Recent ED visit. Head CT showed disease progression and vasogenic edema which was most likely causing symptoms of leg weakness. She reports feeling better since starting back on decadron.   We discussed Her current illness and what it means in the larger context of Her on-going co-morbidities. Natural  disease trajectory and expectations were discussed.  Sandra Horne is realistic in her understanding. She knows her condition is tenuous however is remaining hopeful for some stability. She is interested in further discussions with medical Oncology to gain understanding of plan of care and potential treatment options. She wishes to continue to treat the treatable at this time allowing her every opportunity to continue  to thrive.   We discussed having a family discussion once she has visit with Oncologist offering assistance in complex decision making.   No symptom management needs at this time.   I discussed the importance of continued conversation with family and their medical providers regarding overall plan of care and treatment options, ensuring decisions are within the context of the patients values and GOCs.  PLAN: Ongoing goals of care discussions. Will plan for further family discussions. Patient is clear in expressed wishes to continue to treat the treatable allowing her every opportunity to continue to thrive.  No symptom management needs I will plan to see patient back in 1-2 weeks.    Patient expressed understanding and was in agreement with this plan. She also understands that She can call the clinic at any time with any questions, concerns, or complaints.   Any controlled substances utilized were prescribed in the context of palliative care. PDMP has been reviewed.   Time Total: 45 min.   Visit consisted of counseling and education dealing with the complex and emotionally intense issues of symptom management and palliative care in the setting of serious and potentially life-threatening illness.Greater than 50%  of this time was spent counseling and coordinating care related to the above assessment and plan.  Alda Lea, AGPCNP-BC  Palliative Medicine Team/[facility]

## 2022-01-15 ENCOUNTER — Other Ambulatory Visit: Payer: Self-pay

## 2022-01-19 ENCOUNTER — Telehealth: Payer: Self-pay | Admitting: Radiation Therapy

## 2022-01-19 ENCOUNTER — Encounter: Payer: Self-pay | Admitting: Urology

## 2022-01-19 ENCOUNTER — Encounter (HOSPITAL_COMMUNITY): Payer: Medicare Other | Admitting: Physical Therapy

## 2022-01-19 NOTE — Telephone Encounter (Signed)
I called [name] to communicate her steroid taper instructions. She seemed to understand what was being told to her and was able to read back the dates and dose instructions given.   Radiation Oncology         [phone] ________________________________   Name: Sandra Horne        PFX:[id number]   Date of Service: 01/19/22  DOB: 12-13-1955     Steroid Taper Instructions     You currently have a prescription for Dexamethasone 4 mg Tablets.    Beginning 01/19/22  Take a 4 mg tablet once a day   Beginning 01/26/22: Take 1/2 of a tablet (which is 2 mg) once a day   Beginning 01/31/22: Take 1/2 of a tablet (which is 2 mg) every other day and stop on 02/08/22.     Please call our office if you have any headaches, visual changes, uncontrolled movements, extremity weakness, nausea or vomiting.     Mont Dutton R.T.(R)(T) Radiation Special Procedures Navigator

## 2022-01-19 NOTE — Progress Notes (Signed)
  Radiation Oncology         [phone] ________________________________  Name: Sandra Horne  CZY:[id number]  Date of Service: 01/19/22  DOB: 09-Nov-1955   Steroid Taper Instructions   You currently have a prescription for Dexamethasone 4 mg Tablets.   Beginning 01/19/22  Take a 4 mg tablet once a day  Beginning 01/26/22: Take 1/2 of a tablet (which is 2 mg) once a day  Beginning 01/31/22: Take 1/2 of a tablet (which is 2 mg) every other day and stop on 02/08/22.   Please call our office if you have any headaches, visual changes, uncontrolled movements, extremity weakness, nausea or vomiting.

## 2022-01-21 ENCOUNTER — Encounter (HOSPITAL_COMMUNITY): Payer: Medicare Other | Admitting: Physical Therapy

## 2022-02-01 ENCOUNTER — Other Ambulatory Visit: Payer: Self-pay

## 2022-02-01 ENCOUNTER — Encounter: Payer: Self-pay | Admitting: Nurse Practitioner

## 2022-02-01 ENCOUNTER — Inpatient Hospital Stay: Payer: Medicare Other | Attending: Physician Assistant | Admitting: Internal Medicine

## 2022-02-01 ENCOUNTER — Encounter: Payer: Self-pay | Admitting: Internal Medicine

## 2022-02-01 ENCOUNTER — Inpatient Hospital Stay (HOSPITAL_BASED_OUTPATIENT_CLINIC_OR_DEPARTMENT_OTHER): Payer: Medicare Other | Admitting: Nurse Practitioner

## 2022-02-01 VITALS — BP 122/83 | HR 101 | Temp 98.4°F | Wt 148.3 lb

## 2022-02-01 DIAGNOSIS — Z7189 Other specified counseling: Secondary | ICD-10-CM

## 2022-02-01 DIAGNOSIS — R530 Neoplastic (malignant) related fatigue: Secondary | ICD-10-CM

## 2022-02-01 DIAGNOSIS — C7931 Secondary malignant neoplasm of brain: Secondary | ICD-10-CM | POA: Diagnosis not present

## 2022-02-01 DIAGNOSIS — C3490 Malignant neoplasm of unspecified part of unspecified bronchus or lung: Secondary | ICD-10-CM | POA: Diagnosis not present

## 2022-02-01 DIAGNOSIS — C3492 Malignant neoplasm of unspecified part of left bronchus or lung: Secondary | ICD-10-CM | POA: Insufficient documentation

## 2022-02-01 DIAGNOSIS — C3432 Malignant neoplasm of lower lobe, left bronchus or lung: Secondary | ICD-10-CM | POA: Diagnosis not present

## 2022-02-01 DIAGNOSIS — Z79899 Other long term (current) drug therapy: Secondary | ICD-10-CM | POA: Insufficient documentation

## 2022-02-01 DIAGNOSIS — F17210 Nicotine dependence, cigarettes, uncomplicated: Secondary | ICD-10-CM | POA: Diagnosis not present

## 2022-02-01 DIAGNOSIS — Z515 Encounter for palliative care: Secondary | ICD-10-CM

## 2022-02-01 NOTE — Progress Notes (Signed)
[place]  Telephone:([phone] [phone]) [phone]   Name: Sandra Horne Date: 02/01/2022 MRN: [id number]  DOB: 1956/01/01  Patient Care Team: System, Provider Not In as PCP - General Pickenpack-Cousar, Carlena Sax, NP as Nurse Practitioner (Nurse Practitioner)    INTERVAL HISTORY: Sandra Horne is a 66 y.o. female with oncologic medical history including primary lung CA (left infrahilar mass dx Sept 2021 but lost to f/u, last radiation treatment 10/29/2021), chronic anemia, right femoral neck fracture (s/p right hemiarthroplasty on 10/13/2021) and tobacco abuse. She was recently admitted on 10/31/2021 with new right-sided weakness. CT of head revealed left parietal lobe mass with vasogenic edema. She is currently undergoing SRS.  Palliative ask to see for symptom management and goals of care.   SOCIAL HISTORY:     reports that she has been smoking cigarettes. She has a 10.00 pack-year smoking history. She has never used smokeless tobacco. She reports that she does not currently use alcohol. She reports that she does not currently use drugs after having used the following drugs: Marijuana.  ADVANCE DIRECTIVES:    CODE STATUS:   PAST MEDICAL HISTORY: Past Medical History:  Diagnosis Date   Cancer ([place])    Lung and Brain mets    ALLERGIES:  has No Known Allergies.  MEDICATIONS:  Current Outpatient Medications  Medication Sig Dispense Refill   dexamethasone (DECADRON) 4 MG tablet Take 1 tablet (4 mg total) by mouth 2 (two) times daily with a meal. 60 tablet 0   gabapentin (NEURONTIN) 300 MG capsule Take 1 capsule (300 mg total) by mouth 3 (three) times daily. 90 capsule 5   ibuprofen (ADVIL) 800 MG tablet Take 1 tablet (800 mg total) by mouth every 8 (eight) hours as needed. 90 tablet 1   levETIRAcetam (KEPPRA) 500 MG tablet Take 1 tablet (500 mg total) by mouth 2 (two) times daily. 60 tablet 2   methocarbamol (ROBAXIN) 500 MG tablet  Take 1 tablet (500 mg total) by mouth 3 (three) times daily. 60 tablet 1   pantoprazole (PROTONIX) 40 MG tablet Take 1 tablet by mouth once daily (Patient not taking: Reported on 01/13/2022) 30 tablet 0   No current facility-administered medications for this visit.    VITAL SIGNS: There were no vitals taken for this visit. There were no vitals filed for this visit.  Estimated body mass index is 25.46 kg/m as calculated from the following:   Height as of 01/08/22: 5\' 4"  (1.626 m).   Weight as of an earlier encounter on 02/01/22: 148 lb 4.8 oz (67.3 kg).   PERFORMANCE STATUS (ECOG) : 1 - Symptomatic but completely ambulatory   Physical Exam General: NAD, ambulatory with walker Cardiovascular: regular rate and rhythm Extremities: no edema, no joint deformities Skin: no rashes Neurological: AAO X3  IMPRESSION: Sandra Horne presents to clinic today for follow-up.  No acute distress noted.  She is ambulatory with walker.  States that she is feeling the best she has felt in several months.  She also had a visit today with Dr. Earlie Server where he continued to offer her additional treatment for her cancer.  Patient however continues to decline at this time stating she is not interested in pursuing further treatments.    I created space and opportunity for patient to express thoughts and feelings in regards to her current illness.  Sandra Horne states she is ready to return home as she is currently living with her daughter.  Feels that  she is doing much better and does not wish to pursue treatments and take the risk of feeling poorly again.  Education provided on risk of disease progression in the setting of no interventions.  She verbalized understanding again stating she wishes to continue taking things 1 day at a time allowing herself every opportunity to continue to thrive is much as possible while feeling good.  She is much appreciative of her current quality of life.  She understands if her disease  progresses she may become more symptomatic and not be a candidate for further treatment.  She is clear in her expressed wishes that she will address that when the time comes.  No symptom management needs at this time.   I discussed the importance of continued conversation with family and their medical providers regarding overall plan of care and treatment options, ensuring decisions are within the context of the patients values and GOCs.  PLAN: Ongoing goals of care discussions.  Patient is clear in her expressed wishes that she is not interested in pursuing further treatment at this time.  Is appreciative of her current quality of life expresses she has felt the best that she has in several months.  I had an open and honest discussion with her regarding high likelihood of disease progression and increase in symptoms.  She verbalized understanding again expressing wishes to take things 1 day at a time. No symptom management needs I will plan to see patient back in 3-4 weeks.  She knows to contact our office sooner if needed.   Patient expressed understanding and was in agreement with this plan. She also understands that She can call the clinic at any time with any questions, concerns, or complaints.   Time Total: 40 min   Visit consisted of counseling and education dealing with the complex and emotionally intense issues of symptom management and palliative care in the setting of serious and potentially life-threatening illness.Greater than 50%  of this time was spent counseling and coordinating care related to the above assessment and plan.  Sandra Horne, AGPCNP-BC  Palliative Medicine Team/[place]
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
## 2022-02-09 ENCOUNTER — Other Ambulatory Visit: Payer: Self-pay | Admitting: Radiation Therapy

## 2022-02-09 DIAGNOSIS — C7931 Secondary malignant neoplasm of brain: Secondary | ICD-10-CM

## 2022-02-15 NOTE — Progress Notes (Signed)
  Radiation Oncology         [phone] ________________________________  Name: Sandra Horne MRN: [id number]  Date of Service: 02/16/2022  DOB: [birth year]  Post Treatment Telephone Note  Diagnosis:  Malignant neoplasm metastatic to brain [facility]) (as documented in provider EOT note)   The patient was not available for call today. Voice mailbox full. No voicemail left.   The patient was counseled that she will be contacted by our brain and spine navigator to schedule surveillance imaging. The patient was encouraged to call if  she have not received a call to schedule imaging, or if she develop concerns or questions regarding radiation. The patient will also continue to follow up with Dr. Julien Nordmann in medical oncology.   Leandra Kern, LPN

## 2022-02-16 ENCOUNTER — Ambulatory Visit
Admission: RE | Admit: 2022-02-16 | Discharge: 2022-02-16 | Disposition: A | Discharge status: Home / Self Care | Payer: Medicare Other | Source: Ambulatory Visit | Attending: Urology | Admitting: Urology

## 2022-02-16 DIAGNOSIS — C3490 Malignant neoplasm of unspecified part of unspecified bronchus or lung: Secondary | ICD-10-CM

## 2022-02-16 NOTE — Progress Notes (Signed)
  Radiation Oncology         [phone]) [phone] ________________________________  Name: Sandra Horne MRN: [id number]  Date: 01/13/2022  DOB: 1955-08-31  End of Treatment Note  Diagnosis:   66 y/o woman with a new 33mm metastasis in the right mid precentral gyrus and a 50mm metastasis in the right parietal lobe, secondary to Stage IV NSCLC adenocarcinoma of the LLL lung.       Indication for treatment:  Palliation       Radiation treatment dates:   01/13/22  Site/dose/beams/energy:  01/13/22: SRS brain PTV 7-8// The patient received a single fraction of 20 Gy to the new right precentral gyrus and right parietal lesions     Narrative: The patient tolerated radiation treatment relatively well without any ill side effects.  Plan: The patient has completed radiation treatment. The patient will return to radiation oncology clinic for routine followup in one month. I advised them to call or return sooner if they have any questions or concerns related to their recovery or treatment. ________________________________  Sheral Apley. Tammi Klippel, M.D.

## 2022-02-16 NOTE — Progress Notes (Signed)
Radiation Oncology         [phone] ________________________________  Name: Sandra Horne MRN: [id number]  Date: 02/16/2022  DOB: 11/06/1955  Post Treatment Note  CC: System, Provider Not In  Sandra Jack, Sandra Horne  Diagnosis:   66 y/o woman with a new 40m metastasis in the right mid precentral gyrus and 315mmetastasis in the right parietal lobe, secondary to Stage IV NSCLC adenocarcinoma of the LLL lung.   Interval Since Last Radiation:  1 month 01/13/22: SRS brain PTV 7-8// The patient received a single fraction of 20 Gy to the new right precentral gyrus and right parietal lesions       10/29/21//SRS PTV2-6:      11/09/21 - 11/13/21//SRS PTV3:    04/03/21 - 04/08/21//fractionated SRS PTV1:    Narrative:  I spoke with the patient to conduct her scheduled 1 month follow up visit via telephone to spare the patient unnecessary potential exposure in the healthcare setting during the current COVID-19 pandemic.  The patient was notified in advance and gave permission to proceed with this visit format.  She has tolerated the SRS brain treatments well, without any acute ill side effects. More recently, she developed headaches and right leg pain/weakness that prompted a visit to the ED on 01/05/22.  A CT head was performed at that time showing a focal area of hypo attenuation consistent with vasogenic edema along the superior, posterior and medial aspect of the right frontal lobe, new since the previous head CT on 10/31/21, concerning for a new or worsened metastatic lesion. The left parietal cystic mass was smaller and the right parietal lobe vasogenic edema had significantly improved. She was started back on Decadron 54m80mo BID with resolution of her headaches and leg weakness. Given the symptoms and concern for progressive disease, we recommended moving up her post-treatment MRI brain scan sooner than planned. This was performed on 01/03/22 and confirmed a new 6 mm metastasis in the medial  right precentral gyrus with moderate to fairly prominent edema surrounding the previously treated peripherally enhancing cystic/necrotic left parietal lobe lesion, slightly increased and associated mass effect with 4 mm right midline shift, similar to the prior MRI of 11/01/2021. There was also a new 3 mm focus of enhancement along the right parietal lobe, suspicious for a new metastasis but alternatively could reflect vascular enhancement. Her images were reviewed in the multidisciplinary conference and consensus recommend patient was to proceed with a single fraction SRS treatment to the 2 new lesions.  We met with the patient on 01/05/2022 to discuss the findings and recommendations and she was in agreement to proceed with the recommended single fraction SRS treatment which was completed on 01/13/2022.                     On review of systems, the patient states that she is doing well in general. She has completed the Decadron taper as directed and is no longer having headaches or right leg weakness. She denies dizziness, imbalance, tremors, seizure activity, focal weakness or difficulty with speech. She has not had recent fevers, chills, N/V/D.  She had a follow-up visit with Dr. MohJulien Nordmann 02/01/2022 to discuss potential systemic therapy but ultimately decided against this and instead, prefers to continue in surveillance.  She also met with NikAlda LeaP in palliative care and will continue to be followed her as well.   ALLERGIES:  has No Known Allergies.  Meds: Current Outpatient Medications  Medication Sig Dispense  Refill   dexamethasone (DECADRON) 4 MG tablet Take 1 tablet (4 mg total) by mouth 2 (two) times daily with a meal. 60 tablet 0   gabapentin (NEURONTIN) 300 MG capsule Take 1 capsule (300 mg total) by mouth 3 (three) times daily. 90 capsule 5   ibuprofen (ADVIL) 800 MG tablet Take 1 tablet (800 mg total) by mouth every 8 (eight) hours as needed. 90 tablet 1   levETIRAcetam  (KEPPRA) 500 MG tablet Take 1 tablet (500 mg total) by mouth 2 (two) times daily. 60 tablet 2   methocarbamol (ROBAXIN) 500 MG tablet Take 1 tablet (500 mg total) by mouth 3 (three) times daily. 60 tablet 1   pantoprazole (PROTONIX) 40 MG tablet Take 1 tablet by mouth once daily (Patient not taking: Reported on 01/13/2022) 30 tablet 0   No current facility-administered medications for this encounter.    Physical Findings:  vitals were not taken for this visit.   /10 Unable to assess due to telephone follow up visit format.  Lab Findings: Lab Results  Component Value Date   WBC 8.2 12/26/2021   HGB 11.1 (L) 12/26/2021   HCT 34.5 (L) 12/26/2021   MCV 88.7 12/26/2021   PLT 384 12/26/2021     Radiographic Findings: No results found.  Impression/Plan: 36. 66 yo woman with brain metastases secondary to Stage IV NSCLC adenocarcinoma of the LLL lung.     She appears to have recovered well from the effects of her recent SRS brain treatment and is currently without complaints.  We discussed the plan to obtain a follow-up MRI brain scan in Jan. 2024, to assess her treatment response and pending this scan is stable, we will then continue with serial MRI brain scans every 3 months going forward to monitor for any evidence of disease progression or recurrence.  She had a recent follow-up with medical oncology and remains adamant that she is not interested in any systemic therapy for treatment of her disease.  I will plan to connect with her by telephone following her next MRI brain scan to review results and any recommendations from the multidisciplinary brain conference but she knows that she is welcome to call at anytime in the interim with any questions or concerns related to her previous radiation.  She will also continue in routine follow-up with Alda Lea, NP in palliative care.  I personally spent 15 minutes in this encounter including chart review, reviewing radiological studies,  telephone discussion with the patient, entering orders and completing documentation.     Nicholos Johns, PA-C

## 2022-02-23 ENCOUNTER — Inpatient Hospital Stay: Payer: Medicare Other | Admitting: Nurse Practitioner

## 2022-02-24 ENCOUNTER — Telehealth: Payer: Medicare Other | Admitting: Nurse Practitioner

## 2022-03-04 ENCOUNTER — Inpatient Hospital Stay: Payer: Medicare Other | Attending: Physician Assistant | Admitting: Nurse Practitioner

## 2022-03-04 ENCOUNTER — Encounter: Payer: Self-pay | Admitting: Nurse Practitioner

## 2022-03-04 DIAGNOSIS — Z7189 Other specified counseling: Secondary | ICD-10-CM | POA: Diagnosis not present

## 2022-03-04 DIAGNOSIS — C7931 Secondary malignant neoplasm of brain: Secondary | ICD-10-CM

## 2022-03-04 DIAGNOSIS — Z515 Encounter for palliative care: Secondary | ICD-10-CM

## 2022-03-04 DIAGNOSIS — C3490 Malignant neoplasm of unspecified part of unspecified bronchus or lung: Secondary | ICD-10-CM | POA: Diagnosis not present

## 2022-03-04 MED ORDER — DEXAMETHASONE 2 MG PO TABS: 2.0000 mg | ORAL_TABLET | Freq: Every day | ORAL | 1 refills | Status: DC

## 2022-03-04 NOTE — Progress Notes (Signed)
[place]  [phone]) [phone] [phone]) [phone]   Name: Sandra Horne Date: 03/04/2022 MRN: [id number]  DOB: Jul 04, 1955  Patient Care Team: System, Provider Not In as PCP - General Pickenpack-Cousar, Carlena Sax, NP as Nurse Practitioner (Nurse Practitioner)   I connected with Sandra Horne on 03/04/22 at 10:00 AM EST by phone and verified that I am speaking with the correct person using two identifiers.   I discussed the limitations, risks, security and privacy concerns of performing an evaluation and management service by telemedicine and the availability of in-person appointments. I also discussed with the patient that there may be a patient responsible charge related to this service. The patient expressed understanding and agreed to proceed.   Other persons participating in the visit and their role in the encounter: [place], RN    Patient's location: Home   Provider's location: [place]    Chief Complaint: Symptom Management Follow-Up   INTERVAL HISTORY: Sandra Horne is a 66 y.o. female with oncologic medical history including primary lung CA (left infrahilar mass dx Sept 2021 but lost to f/u, last radiation treatment 10/29/2021), chronic anemia, right femoral neck fracture (s/p right hemiarthroplasty on 10/13/2021) and tobacco abuse. She was recently admitted on 10/31/2021 with new right-sided weakness. CT of head revealed left parietal lobe mass with vasogenic edema. She is currently undergoing SRS.  Palliative ask to see for symptom management and goals of care.   SOCIAL HISTORY:     reports that she has been smoking cigarettes. She has a 10.00 pack-year smoking history. She has never used smokeless tobacco. She reports that she does not currently use alcohol. She reports that she does not currently use drugs after having used the following drugs: Marijuana.  ADVANCE DIRECTIVES:    CODE STATUS:   PAST MEDICAL  HISTORY: Past Medical History:  Diagnosis Date   Cancer ([place])    Lung and Brain mets    ALLERGIES:  has No Known Allergies.  MEDICATIONS:  Current Outpatient Medications  Medication Sig Dispense Refill   dexamethasone (DECADRON) 2 MG tablet Take 1 tablet (2 mg total) by mouth daily. 30 tablet 1   gabapentin (NEURONTIN) 300 MG capsule Take 1 capsule (300 mg total) by mouth 3 (three) times daily. 90 capsule 5   ibuprofen (ADVIL) 800 MG tablet Take 1 tablet (800 mg total) by mouth every 8 (eight) hours as needed. 90 tablet 1   levETIRAcetam (KEPPRA) 500 MG tablet Take 1 tablet (500 mg total) by mouth 2 (two) times daily. 60 tablet 2   methocarbamol (ROBAXIN) 500 MG tablet Take 1 tablet (500 mg total) by mouth 3 (three) times daily. 60 tablet 1   pantoprazole (PROTONIX) 40 MG tablet Take 1 tablet by mouth once daily (Patient not taking: Reported on 01/13/2022) 30 tablet 0   No current facility-administered medications for this visit.    VITAL SIGNS: There were no vitals taken for this visit. There were no vitals filed for this visit.  Estimated body mass index is 25.46 kg/m as calculated from the following:   Height as of 01/08/22: 5\' 4"  (2.563 m).   Weight as of 02/01/22: 148 lb 4.8 oz (67.3 kg).   PERFORMANCE STATUS (ECOG) : 1 - Symptomatic but completely ambulatory    IMPRESSION: I connected with Sandra Horne by phone for symptom management follow-up. No acute distress identified. She is emotional sharing her mother passed away 3 days ago. She is at her family's  home currently. Emotional support provided.   Sandra Horne continues to express wishes to focus on her quality of life. She does not wish to undergo treatment. Denies constipation, diarrhea, nausea, or vomiting. Is continuing to take her dexamethasone 2mg  daily. Is requesting refill.   No symptom management needs at this time. [name] knows to contact our office as needed.    Goals of Care (02/01/2022) I created space and  opportunity for patient to express thoughts and feelings in regards to her current illness.  [name] states she is ready to return home as she is currently living with her daughter.  Feels that she is doing much better and does not wish to pursue treatments and take the risk of feeling poorly again.  Education provided on risk of disease progression in the setting of no interventions.  She verbalized understanding again stating she wishes to continue taking things 1 day at a time allowing herself every opportunity to continue to thrive is much as possible while feeling good.  She is much appreciative of her current quality of life.  She understands if her disease progresses she may become more symptomatic and not be a candidate for further treatment.  She is clear in her expressed wishes that she will address that when the time comes.  I discussed the importance of continued conversation with family and their medical providers regarding overall plan of care and treatment options, ensuring decisions are within the context of the patients values and GOCs.  PLAN: Ongoing goals of care discussions.  Patient continues to be clear in her expressed wishes that she is not interested in pursuing further treatment at this time.  Is appreciative of her current quality of life expresses she has felt the best that she has in several months.  I had an open and honest discussion with her regarding high likelihood of disease progression and increase in symptoms.  She verbalized understanding again expressing wishes to take things 1 day at a time. No symptom management needs Dexamethasone 2mg  daily  I will plan to see patient back in 4-6 weeks.  She knows to contact our office sooner if needed.   Patient expressed understanding and was in agreement with this plan. She also understands that She can call the clinic at any time with any questions, concerns, or complaints.   Time Total: 35 min   Visit consisted of  counseling and education dealing with the complex and emotionally intense issues of symptom management and palliative care in the setting of serious and potentially life-threatening illness.Greater than 50%  of this time was spent counseling and coordinating care related to the above assessment and plan.  Alda Lea, AGPCNP-BC  Palliative Medicine Team/[facility]

## 2022-03-09 NOTE — Op Note (Signed)
Name: Sandra Horne    MRN: [id number]   Date: 01/13/2022    DOB: Jun 17, 1955   STEREOTACTIC RADIOSURGERY OPERATIVE NOTE  PRE-OPERATIVE DIAGNOSIS:  Lung metastasis, 2 targets (Right precentral 35mm, Right parietal 75mm)  POST-OPERATIVE DIAGNOSIS:  Same  PROCEDURE:  Stereotactic Radiosurgery  SURGEON:  Elwin Sleight, DO  RADIATION ONCOLOGIST: Dr. Tammi Klippel  TECHNIQUE:  The patient underwent a radiation treatment planning session in the radiation oncology simulation suite under the care of the radiation oncology physician and physicist.  I participated closely in the radiation treatment planning afterwards. The patient underwent planning CT which was fused to 3T high resolution MRI with 1 mm axial slices.  These images were fused on the planning system.  We contoured the gross target volumes and subsequently expanded this to yield the Planning Target Volume. I actively participated in the planning process.  I helped to define and review the target contours and also the contours of the optic pathway, eyes, brainstem and selected nearby organs at risk.  All the dose constraints for critical structures were reviewed and compared to AAPM Task Group 101.  The prescription dose conformity was reviewed.  I approved the plan electronically.    Accordingly, Sandra Horne  was brought to the TrueBeam stereotactic radiation treatment linac and placed in the custom immobilization mask.  The patient was aligned according to the IR fiducial markers with BrainLab Exactrac, then orthogonal x-rays were used in ExacTrac with the 6DOF robotic table and the shifts were made to align the patient.  Sandra Horne received stereotactic radiosurgery to a prescription dose of 20Gy uneventfully to both targets.    The detailed description of the procedure is recorded in the radiation oncology procedure note.  I was present for the duration of the procedure.  DISPOSITION:   Following delivery, the patient was  transported to nursing in stable condition and monitored for possible acute effects to be discharged to home in stable condition with follow-up in one month.  Elwin Sleight, [facility]

## 2022-03-09 NOTE — Addendum Note (Signed)
Encounter addended by: Karsten Ro, DO on: 03/09/2022 12:25 PM  Actions taken: Clinical Note Signed

## 2022-04-06 ENCOUNTER — Other Ambulatory Visit: Payer: Self-pay

## 2022-04-06 ENCOUNTER — Emergency Department (HOSPITAL_COMMUNITY): Payer: Medicare Other

## 2022-04-06 ENCOUNTER — Emergency Department (HOSPITAL_COMMUNITY)
Admission: EM | Admit: 2022-04-06 | Discharge: 2022-04-06 | Disposition: A | Discharge status: Home / Self Care | Payer: Medicare Other | Attending: Student | Admitting: Student

## 2022-04-06 DIAGNOSIS — Z85841 Personal history of malignant neoplasm of brain: Secondary | ICD-10-CM | POA: Diagnosis not present

## 2022-04-06 DIAGNOSIS — R091 Pleurisy: Secondary | ICD-10-CM | POA: Insufficient documentation

## 2022-04-06 DIAGNOSIS — F17210 Nicotine dependence, cigarettes, uncomplicated: Secondary | ICD-10-CM | POA: Diagnosis not present

## 2022-04-06 DIAGNOSIS — D75839 Thrombocytosis, unspecified: Secondary | ICD-10-CM | POA: Diagnosis not present

## 2022-04-06 DIAGNOSIS — R079 Chest pain, unspecified: Secondary | ICD-10-CM | POA: Diagnosis not present

## 2022-04-06 DIAGNOSIS — D72829 Elevated white blood cell count, unspecified: Secondary | ICD-10-CM | POA: Diagnosis not present

## 2022-04-06 DIAGNOSIS — R058 Other specified cough: Secondary | ICD-10-CM | POA: Insufficient documentation

## 2022-04-06 DIAGNOSIS — J984 Other disorders of lung: Secondary | ICD-10-CM

## 2022-04-06 DIAGNOSIS — R0789 Other chest pain: Secondary | ICD-10-CM | POA: Diagnosis not present

## 2022-04-06 DIAGNOSIS — Z85118 Personal history of other malignant neoplasm of bronchus and lung: Secondary | ICD-10-CM | POA: Insufficient documentation

## 2022-04-06 DIAGNOSIS — Z1152 Encounter for screening for COVID-19: Secondary | ICD-10-CM | POA: Insufficient documentation

## 2022-04-06 LAB — CBC WITH DIFFERENTIAL/PLATELET
Abs Immature Granulocytes: 0.11 10*3/uL — ABNORMAL HIGH (ref 0.00–0.07)
Basophils Absolute: 0 10*3/uL (ref 0.0–0.1)
Basophils Relative: 0 %
Eosinophils Absolute: 0 10*3/uL (ref 0.0–0.5)
Eosinophils Relative: 0 %
HCT: 37.8 % (ref 36.0–46.0)
Hemoglobin: 12.1 g/dL (ref 12.0–15.0)
Immature Granulocytes: 1 %
Lymphocytes Relative: 12 %
Lymphs Abs: 1.5 10*3/uL (ref 0.7–4.0)
MCH: 29.3 pg (ref 26.0–34.0)
MCHC: 32 g/dL (ref 30.0–36.0)
MCV: 91.5 fL (ref 80.0–100.0)
Monocytes Absolute: 0.8 10*3/uL (ref 0.1–1.0)
Monocytes Relative: 6 %
Neutro Abs: 10.2 10*3/uL — ABNORMAL HIGH (ref 1.7–7.7)
Neutrophils Relative %: 81 %
Platelets: 459 10*3/uL — ABNORMAL HIGH (ref 150–400)
RBC: 4.13 MIL/uL (ref 3.87–5.11)
RDW: 14.9 % (ref 11.5–15.5)
WBC: 12.7 10*3/uL — ABNORMAL HIGH (ref 4.0–10.5)
nRBC: 0 % (ref 0.0–0.2)

## 2022-04-06 LAB — COMPREHENSIVE METABOLIC PANEL
ALT: 13 U/L (ref 0–44)
AST: 14 U/L — ABNORMAL LOW (ref 15–41)
Albumin: 3.5 g/dL (ref 3.5–5.0)
Alkaline Phosphatase: 70 U/L (ref 38–126)
Anion gap: 10 (ref 5–15)
BUN: 9 mg/dL (ref 8–23)
CO2: 26 mmol/L (ref 22–32)
Calcium: 9 mg/dL (ref 8.9–10.3)
Chloride: 103 mmol/L (ref 98–111)
Creatinine, Ser: 0.62 mg/dL (ref 0.44–1.00)
GFR, Estimated: >60 mL/min (ref >60)
Glucose, Bld: 108 mg/dL — ABNORMAL HIGH (ref 70–99)
Potassium: 3.9 mmol/L (ref 3.5–5.1)
Sodium: 139 mmol/L (ref 135–145)
Total Bilirubin: 0.7 mg/dL (ref 0.3–1.2)
Total Protein: 7.2 g/dL (ref 6.5–8.1)

## 2022-04-06 LAB — URINALYSIS, ROUTINE W REFLEX MICROSCOPIC
Bilirubin Urine: NEGATIVE
Glucose, UA: NEGATIVE mg/dL
Hgb urine dipstick: NEGATIVE
Ketones, ur: 5 mg/dL — AB
Leukocytes,Ua: NEGATIVE
Nitrite: NEGATIVE
Protein, ur: NEGATIVE mg/dL
Specific Gravity, Urine: 1.012 (ref 1.005–1.030)
pH: 7 (ref 5.0–8.0)

## 2022-04-06 LAB — TROPONIN I (HIGH SENSITIVITY)
Troponin I (High Sensitivity): <2 ng/L (ref <18)
Troponin I (High Sensitivity): 2 ng/L (ref <18)

## 2022-04-06 LAB — RESP PANEL BY RT-PCR (RSV, FLU A&B, COVID)  RVPGX2
Influenza A by PCR: NEGATIVE
Influenza B by PCR: NEGATIVE
Resp Syncytial Virus by PCR: NEGATIVE
SARS Coronavirus 2 by RT PCR: NEGATIVE

## 2022-04-06 LAB — LIPASE, BLOOD: Lipase: 27 U/L (ref 11–51)

## 2022-04-06 MED ORDER — NAPROXEN 375 MG PO TABS: 375.0000 mg | ORAL_TABLET | Freq: Two times a day (BID) | ORAL | 0 refills | Status: DC

## 2022-04-06 MED ORDER — ACETAMINOPHEN 500 MG PO TABS: 1000.0000 mg | ORAL_TABLET | Freq: Three times a day (TID) | ORAL | 0 refills | Status: AC

## 2022-04-06 MED ORDER — KETOROLAC TROMETHAMINE 15 MG/ML IJ SOLN
15.0000 mg | Freq: Once | INTRAMUSCULAR | Status: AC
  Administered 2022-04-06: 15 mg via INTRAVENOUS
  Filled 2022-04-06: qty 1

## 2022-04-06 MED ORDER — LIDOCAINE 5 % EX PTCH: 1.0000 | MEDICATED_PATCH | CUTANEOUS | 0 refills | Status: DC

## 2022-04-06 MED ORDER — OXYCODONE HCL 5 MG PO TABS: 5.0000 mg | ORAL_TABLET | Freq: Four times a day (QID) | ORAL | 0 refills | Status: DC | PRN

## 2022-04-06 MED ORDER — LIDOCAINE 5 % EX PTCH
1.0000 | MEDICATED_PATCH | CUTANEOUS | Status: DC
  Administered 2022-04-06: 1 via TRANSDERMAL
  Filled 2022-04-06: qty 1

## 2022-04-06 MED ORDER — IOHEXOL 350 MG/ML SOLN
75.0000 mL | Freq: Once | INTRAVENOUS | Status: AC | PRN
  Administered 2022-04-06: 75 mL via INTRAVENOUS

## 2022-04-06 MED ORDER — AMOXICILLIN-POT CLAVULANATE 875-125 MG PO TABS
1.0000 | ORAL_TABLET | Freq: Once | ORAL | Status: AC
  Administered 2022-04-06: 1 via ORAL
  Filled 2022-04-06: qty 1

## 2022-04-06 MED ORDER — AMOXICILLIN-POT CLAVULANATE 875-125 MG PO TABS: 1.0000 | ORAL_TABLET | Freq: Two times a day (BID) | ORAL | 0 refills | Status: DC

## 2022-04-06 NOTE — ED Notes (Signed)
Patient transported to CT 

## 2022-04-06 NOTE — ED Triage Notes (Signed)
Pt c/o right lower rib pain x 4 days. No coughing noted.

## 2022-04-06 NOTE — ED Provider Notes (Signed)
[facility] EMERGENCY DEPARTMENT Provider Note  CSN: [id number] Arrival date & time: 04/06/22 0732  Chief Complaint(s) Chest Pain  HPI Sandra Horne is a 67 y.o. 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  Patient has not completed therapy and was previously scheduled for immunotherapy but after discussions with the patient it is unclear as to why she is not pursuing this at this time.  She states that over the last 4 days she has had progressively worsening right-sided pleuritic chest pain under the right breast.  No associated shortness of breath, abdominal pain, nausea, vomiting, fever or other systemic symptoms.   Past Medical History Past Medical History:  Diagnosis Date   Cancer ([place])    Lung and Brain mets   Patient Active Problem List   Diagnosis Date Noted   Malnutrition of moderate degree 11/02/2021   Rt Sided Weakness due to Vasogenic Brain edema 10/31/2021   Tobacco abuse 10/31/2021   Closed displaced fracture of right femoral neck ([place]) 10/12/2021   Malignant neoplasm metastatic to brain [facility]) 03/27/2021   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 ([place]) 03/26/2021   Acute encephalopathy 03/25/2021   Primary cancer of left lower lobe of lung ([place]) 01/14/2020   CLOSED FRACTURE OF UNSPECIFIED PART OF HUMERUS 04/01/2010   Home Medication(s) Prior to Admission medications   Medication Sig Start Date End Date Taking? Authorizing Provider  dexamethasone (DECADRON) 2 MG tablet Take 1 tablet (2 mg total) by mouth daily. 03/04/22   Pickenpack-Cousar, Carlena Sax, NP  gabapentin (NEURONTIN) 300 MG capsule Take 1 capsule (300 mg total) by mouth 3 (three) times daily. 01/13/22   Carole Civil, MD  ibuprofen (ADVIL) 800 MG tablet Take 1 tablet (800 mg total) by mouth every 8 (eight) hours as needed. 01/13/22   Carole Civil, MD   levETIRAcetam (KEPPRA) 500 MG tablet Take 1 tablet (500 mg total) by mouth 2 (two) times daily. 11/03/21 02/01/22  Barb Merino, MD  methocarbamol (ROBAXIN) 500 MG tablet Take 1 tablet (500 mg total) by mouth 3 (three) times daily. 01/13/22   Carole Civil, MD  pantoprazole (PROTONIX) 40 MG tablet Take 1 tablet by mouth once daily Patient not taking: Reported on 01/13/2022 01/05/22   Tyler Pita, MD                                                                                                                                    Past Surgical History Past Surgical History:  Procedure Laterality Date   BRONCHIAL BRUSHINGS  03/26/2021   Procedure: BRONCHIAL BRUSHINGS;  Surgeon: Margaretha Seeds, MD;  Location: [facility] ENDOSCOPY;  Service: Cardiopulmonary;;   BRONCHIAL NEEDLE ASPIRATION BIOPSY  03/26/2021   Procedure: BRONCHIAL NEEDLE ASPIRATION BIOPSIES;  Surgeon: Margaretha Seeds, MD;  Location: [facility] ENDOSCOPY;  Service: Cardiopulmonary;;   BRONCHIAL WASHINGS  03/26/2021   Procedure: BRONCHIAL WASHINGS;  Surgeon: Margaretha Seeds, MD;  Location: [facility];  Service: Cardiopulmonary;;   ECTOPIC PREGNANCY SURGERY     ENDOBRONCHIAL ULTRASOUND Bilateral 03/26/2021   Procedure: ENDOBRONCHIAL ULTRASOUND;  Surgeon: Margaretha Seeds, MD;  Location: [facility] ENDOS[facility];  Service: Cardiopulmonary;  Laterality: Bilateral;   HEMOSTASIS CONTROL  03/26/2021   Procedure: HEMOSTASIS CONTROL;  Surgeon: Margaretha Seeds, MD;  Location: [facility] ENDOS[facility];  Service: Cardiopulmonary;;   HIP ARTHROPLASTY Right 10/13/2021   Procedure: ARTHROPLASTY BIPOLAR HIP (HEMIARTHROPLASTY);  Surgeon: Carole Civil, MD;  Location: [facility];  Service: Orthopedics;  Laterality: Right;   Family History Family History  Problem Relation Age of Onset   Hypertension Mother    Stroke Mother    Hypertension Father    Cancer Sister    Cancer Paternal Uncle     Social History Social History   Tobacco Use   Smoking status:  Some Days    Packs/day: 0.25    Years: 40.00    Total pack years: 10.00    Types: Cigarettes   Smokeless tobacco: Never  Vaping Use   Vaping Use: Never used  Substance Use Topics   Alcohol use: Not Currently    Comment: drinks beer rarely    Drug use: Not Currently    Types: Marijuana    Comment: last marijuana use mid 11/19/2019   Allergies Patient has no known allergies.  Review of Systems Review of Systems  Cardiovascular:  Positive for chest pain.    Physical Exam Vital Signs  I have reviewed the triage vital signs BP (!) 138/94 (BP Location: Left Arm)   Pulse (!) 103   Temp 98.1 F (36.7 C) (Oral)   Resp 18   Ht 5\' 6"  (1.676 m)   Wt 68 kg   SpO2 98%   BMI 24.21 kg/m   Physical Exam Vitals and nursing note reviewed.  Constitutional:      General: She is not in acute distress.    Appearance: She is well-developed.  HENT:     Head: Normocephalic and atraumatic.  Eyes:     Conjunctiva/sclera: Conjunctivae normal.  Cardiovascular:     Rate and Rhythm: Normal rate and regular rhythm.     Heart sounds: No murmur heard. Pulmonary:     Effort: Pulmonary effort is normal. No respiratory distress.     Breath sounds: Normal breath sounds.  Chest:     Chest wall: Tenderness present.  Abdominal:     Palpations: Abdomen is soft.     Tenderness: There is no abdominal tenderness.  Musculoskeletal:        General: No swelling.     Cervical back: Neck supple.  Skin:    General: Skin is warm and dry.     Capillary Refill: Capillary refill takes less than 2 seconds.  Neurological:     Mental Status: She is alert.  Psychiatric:        Mood and Affect: Mood normal.     ED Results and Treatments Labs (all labs ordered are listed, but only abnormal results are displayed) Labs Reviewed  CBC WITH DIFFERENTIAL/PLATELET - Abnormal; Notable for the following components:      Result Value   WBC 12.7 (*)    Platelets 459 (*)    Neutro Abs 10.2 (*)    Abs Immature  Granulocytes 0.11 (*)    All other components within normal limits  COMPREHENSIVE METABOLIC PANEL - Abnormal;  Notable for the following components:   Glucose, Bld 108 (*)    AST 14 (*)    All other components within normal limits  URINALYSIS, ROUTINE W REFLEX MICROSCOPIC - Abnormal; Notable for the following components:   Ketones, ur 5 (*)    All other components within normal limits  RESP PANEL BY RT-PCR (RSV, FLU A&B, COVID)  RVPGX2  LIPASE, BLOOD  TROPONIN I (HIGH SENSITIVITY)  TROPONIN I (HIGH SENSITIVITY)                                                                                                                          Radiology DG Chest 2 View  Result Date: 04/06/2022 CLINICAL DATA:  Chest pain.  History of lung cancer EXAM: CHEST - 2 VIEW COMPARISON:  Chest 10/14/2021 FINDINGS: Interval development of infiltrate in the lingula, probable pneumonia. No effusion Right lung clear.  No heart failure or edema identified. IMPRESSION: Lingular airspace density likely pneumonia. Given the history of lung cancer, post treatment effect and tumor spread are possible. Close follow-up recommended. Electronically Signed   By: Franchot Gallo M.D.   On: 04/06/2022 09:56    Pertinent labs & imaging results that were available during my care of the patient were reviewed by me and considered in my medical decision making (see MDM for details).  Medications Ordered in ED Medications  lidocaine (LIDODERM) 5 % 1 patch (1 patch Transdermal Patch Applied 04/06/22 1009)  iohexol (OMNIPAQUE) 350 MG/ML injection 75 mL (75 mLs Intravenous Contrast Given 04/06/22 1220)                                                                                                                                     Procedures Procedures  (including critical care time)  Medical Decision Making / ED Course   This patient presents to the ED for concern of chest pain, this involves an extensive number of treatment options,  and is a complaint that carries with it a high risk of complications and morbidity.  The differential diagnosis includes pneumonia, PE, tumor burden worsening, metastasis with lytic lesions in the ribs, ACS  MDM: Patient seen emerged part for evaluation of chest pain.  Physical exam reveals reproducible pinpoint tenderness under the breast on the right along the rib line.  Laboratory evaluation with a leukocytosis to 12.9, thrombocytosis to 459, COVID, flu, RSV negative, high-sensitivity troponin negative.  Chest x-ray with an airspace density consistent with likely pneumonia but given patient's known cancer history and significant pleurisy here in the ER follow-up CT PE was obtained that was reassuringly negative for PE but does show a likely postobstructive pneumonitis in the lingula near the source of the patient's pain.  Given the reproducible nature of her pain, no exertional symptoms, and negative troponins I have very low suspicion for ACS as the source of her pain.  She was covered with Augmentin and received pain control with Lidoderm and Toradol.  I had a long discussion with the patient about her current cancer care and her PE today does show persistence of her hilar mass and strangely the patient believes that her cancer was completely treated and that is why she is not on persistent therapy.  I spoke with both the patient and the patient's daughter informing them that via chart review it appears that Dr. Julien Nordmann of oncology wanted to pursue immunotherapy and given the persistence of her mass she should urgently follow back up with Dr. Earlie Server to have a longer discussion about her treatment options as it does not appear that her cancer has been completely eradicated.  Both the patient and her daughter voiced understanding of this and the patient was discharged with outpatient oncology follow-up.   Additional history obtained: -Additional history obtained from daughter -External records from  outside source obtained and reviewed including: Chart review including previous notes, labs, imaging, consultation notes   Lab Tests: -I ordered, reviewed, and interpreted labs.   The pertinent results include:   Labs Reviewed  CBC WITH DIFFERENTIAL/PLATELET - Abnormal; Notable for the following components:      Result Value   WBC 12.7 (*)    Platelets 459 (*)    Neutro Abs 10.2 (*)    Abs Immature Granulocytes 0.11 (*)    All other components within normal limits  COMPREHENSIVE METABOLIC PANEL - Abnormal; Notable for the following components:   Glucose, Bld 108 (*)    AST 14 (*)    All other components within normal limits  URINALYSIS, ROUTINE W REFLEX MICROSCOPIC - Abnormal; Notable for the following components:   Ketones, ur 5 (*)    All other components within normal limits  RESP PANEL BY RT-PCR (RSV, FLU A&B, COVID)  RVPGX2  LIPASE, BLOOD  TROPONIN I (HIGH SENSITIVITY)  TROPONIN I (HIGH SENSITIVITY)      EKG   EKG Interpretation  Date/Time:  Tuesday April 06 2022 09:23:24 EST Ventricular Rate:  95 PR Interval:  158 QRS Duration: 86 QT Interval:  352 QTC Calculation: 442 R Axis:   70 Text Interpretation: Normal sinus rhythm Normal ECG When compared with ECG of 31-Oct-2021 14:53, PREVIOUS ECG IS PRESENT Confirmed by Mikell Camp ([id number]) on 04/06/2022 7:31:43 PM         Imaging Studies ordered: I ordered imaging studies including chest x-ray, CT PE I independently visualized and interpreted imaging. I agree with the radiologist interpretation   Medicines ordered and prescription drug management: Meds ordered this encounter  Medications   lidocaine (LIDODERM) 5 % 1 patch   iohexol (OMNIPAQUE) 350 MG/ML injection 75 mL    -I have reviewed the patients home medicines and have made adjustments as needed  Critical interventions none    Cardiac Monitoring: The patient was maintained on a cardiac monitor.  I personally viewed and interpreted the cardiac  monitored which showed an underlying rhythm of: NSR  Social Determinants of Health:  Factors impacting patients  care include: none   Reevaluation: After the interventions noted above, I reevaluated the patient and found that they have :improved  Co morbidities that complicate the patient evaluation  Past Medical History:  Diagnosis Date   Cancer ([place])    Lung and Brain mets      Dispostion: I considered admission for this patient, but at this time she does not meet inpatient criteria for admission and she is safe for discharge with outpatient follow-up     Final Clinical Impression(s) / ED Diagnoses Final diagnoses:  None     @PCDICTATION @    Teressa Lower, MD 04/06/22 1932

## 2022-04-06 NOTE — Discharge Instructions (Addendum)
For pain:  - Acetaminophen 1000 mg three times daily (every 8 hours) - Naproxen 2 times daily (every 12 hours) - oxycodone for breakthrough pain only - lidoderm patches every 12 hours

## 2022-04-08 ENCOUNTER — Ambulatory Visit (HOSPITAL_COMMUNITY): Payer: Medicare Other | Attending: Radiation Oncology

## 2022-04-12 ENCOUNTER — Inpatient Hospital Stay: Payer: 59

## 2022-04-14 ENCOUNTER — Ambulatory Visit: Payer: Medicaid Other | Admitting: Urology

## 2022-04-15 ENCOUNTER — Inpatient Hospital Stay: Payer: 59 | Attending: Physician Assistant | Admitting: Nurse Practitioner

## 2022-04-15 ENCOUNTER — Encounter: Payer: Self-pay | Admitting: Nurse Practitioner

## 2022-04-15 DIAGNOSIS — C7931 Secondary malignant neoplasm of brain: Secondary | ICD-10-CM

## 2022-04-15 DIAGNOSIS — Z7189 Other specified counseling: Secondary | ICD-10-CM

## 2022-04-15 DIAGNOSIS — G893 Neoplasm related pain (acute) (chronic): Secondary | ICD-10-CM

## 2022-04-15 DIAGNOSIS — C3490 Malignant neoplasm of unspecified part of unspecified bronchus or lung: Secondary | ICD-10-CM

## 2022-04-15 DIAGNOSIS — Z515 Encounter for palliative care: Secondary | ICD-10-CM | POA: Diagnosis not present

## 2022-04-15 MED ORDER — OXYCODONE HCL 5 MG PO TABS: 5.0000 mg | ORAL_TABLET | Freq: Four times a day (QID) | ORAL | 0 refills | Status: DC | PRN

## 2022-04-15 NOTE — Progress Notes (Signed)
Palliative Medicine [facility]  Telephone:([phone]) [phone] [phone]) [phone]   Name: Sandra Horne Date: 04/15/2022 MRN: [id number]  DOB: 08/17/1955  Patient Care Team: Pcp, No as PCP - General Pickenpack-Cousar, Arty Baumgartner, NP as Nurse Practitioner (Nurse Practitioner)   I connected with Conception Oms Munley on 04/15/22 at 11:30 AM EST by phone and verified that I am speaking with the correct person using two identifiers.   I discussed the limitations, risks, security and privacy concerns of performing an evaluation and management service by telemedicine and the availability of in-person appointments. I also discussed with the patient that there may be a patient responsible charge related to this service. The patient expressed understanding and agreed to proceed.   Other persons participating in the visit and their role in the encounter: [place], RN    Patient's location: Home   Provider's location: [facility]    Chief Complaint: Symptom Management Follow-Up   INTERVAL HISTORY: VISTA SAWATZKY is a 67 y.o. female with oncologic medical history including primary lung CA (left infrahilar mass dx Sept 2021 but lost to f/u, last radiation treatment 10/29/2021), chronic anemia, right femoral neck fracture (s/p right hemiarthroplasty on 10/13/2021) and tobacco abuse. She was recently admitted on 10/31/2021 with new right-sided weakness. CT of head revealed left parietal lobe mass with vasogenic edema. She is currently undergoing SRS.  Palliative ask to see for symptom management and goals of care.   SOCIAL HISTORY:     reports that she has been smoking cigarettes. She has a 10.00 pack-year smoking history. She has never used smokeless tobacco. She reports that she does not currently use alcohol. She reports that she does not currently use drugs after having used the following drugs: Marijuana.  ADVANCE DIRECTIVES:    CODE STATUS:   PAST MEDICAL HISTORY: Past Medical  History:  Diagnosis Date   Cancer (HCC)    Lung and Brain mets    ALLERGIES:  has No Known Allergies.  MEDICATIONS:  Current Outpatient Medications  Medication Sig Dispense Refill   acetaminophen (TYLENOL) 500 MG tablet Take 2 tablets (1,000 mg total) by mouth every 8 (eight) hours. 180 tablet 0   amoxicillin-clavulanate (AUGMENTIN) 875-125 MG tablet Take 1 tablet by mouth every 12 (twelve) hours. 14 tablet 0   dexamethasone (DECADRON) 2 MG tablet Take 1 tablet (2 mg total) by mouth daily. 30 tablet 1   gabapentin (NEURONTIN) 300 MG capsule Take 1 capsule (300 mg total) by mouth 3 (three) times daily. 90 capsule 5   ibuprofen (ADVIL) 800 MG tablet Take 1 tablet (800 mg total) by mouth every 8 (eight) hours as needed. 90 tablet 1   levETIRAcetam (KEPPRA) 500 MG tablet Take 1 tablet (500 mg total) by mouth 2 (two) times daily. 60 tablet 2   lidocaine (LIDODERM) 5 % Place 1 patch onto the skin daily. Remove & Discard patch within 12 hours or as directed by MD 30 patch 0   methocarbamol (ROBAXIN) 500 MG tablet Take 1 tablet (500 mg total) by mouth 3 (three) times daily. 60 tablet 1   naproxen (NAPROSYN) 375 MG tablet Take 1 tablet (375 mg total) by mouth 2 (two) times daily. 20 tablet 0   oxyCODONE (ROXICODONE) 5 MG immediate release tablet Take 1 tablet (5 mg total) by mouth every 6 (six) hours as needed for breakthrough pain. 10 tablet 0   pantoprazole (PROTONIX) 40 MG tablet Take 1 tablet by mouth once daily (Patient not taking:  Reported on 01/13/2022) 30 tablet 0   No current facility-administered medications for this visit.    VITAL SIGNS: There were no vitals taken for this visit. There were no vitals filed for this visit.  Estimated body mass index is 24.21 kg/m as calculated from the following:   Height as of 04/06/22: 5\' 6"  (1.676 m).   Weight as of 04/06/22: 150 lb (68 kg).   PERFORMANCE STATUS (ECOG) : 1 - Symptomatic but completely ambulatory    IMPRESSION: I connected  with [name] via phone for follow-up. No acute distress identified. She is trying to remain as active as possible. Currently still with her daughter in [place]. She shared she recently married her long-term boyfriend of more than 15 years.   I approached discussions regarding patients recent ED visit for chest pain. She reports pain has improved with Oxycodone and rest. She rates pain without medication 6-7 and decreases to 3-4 out of 10 with medications. She denies nausea, vomiting, diarrhea, or constipation. Denies shortness of breath, cough, headaches, or dizziness.   I empathetically discussed goals of care with patient given previous expressed goals were to not pursue additional treatments. [name] expresses she, her daughter, and family have discussed on several occasions and all continue to mutually agree that she does not want to consider treatment. I encouraged patient if she was interested in further discussions we can arrange a follow-up with she and her family to discuss with Dr. Julien Nordmann. Patient verbalized understanding however declined expressing she is not interested in treatment and feels she wishes to take things one day at a time. She is appreciative of her quality of life.    I discussed the importance of continued conversation with family and their medical providers regarding overall plan of care and treatment options, ensuring decisions are within the context of the patients values and GOCs.   Goals of Care (02/01/2022) I created space and opportunity for patient to express thoughts and feelings in regards to her current illness.  [name] states she is ready to return home as she is currently living with her daughter.  Feels that she is doing much better and does not wish to pursue treatments and take the risk of feeling poorly again.  Education provided on risk of disease progression in the setting of no interventions.  She verbalized understanding again stating she wishes  to continue taking things 1 day at a time allowing herself every opportunity to continue to thrive is much as possible while feeling good.  She is much appreciative of her current quality of life.  She understands if her disease progresses she may become more symptomatic and not be a candidate for further treatment.  She is clear in her expressed wishes that she will address that when the time comes.   PLAN: Ongoing goals of care discussions.  Patient continues to be clear in her expressed wishes that she is not interested in pursuing further treatment at this time.  Is appreciative of her current quality of life. She has recently gotten married and taking things one day at a time.  I had an open and honest discussion with her regarding high likelihood of disease progression and increase in symptoms.  She verbalized understanding. Declines  Oxycodone 5mg  as needed every 6 hours as needed  I will plan to see patient back in 4-6 weeks.  She knows to contact our office sooner if needed.   Patient expressed understanding and was in agreement with this plan.  She also understands that She can call the clinic at any time with any questions, concerns, or complaints.   Any controlled substances utilized were prescribed in the context of palliative care. PDMP has been reviewed.    Time Total: 50 min  Visit consisted of counseling and education dealing with the complex and emotionally intense issues of symptom management and palliative care in the setting of serious and potentially life-threatening illness.Greater than 50%  of this time was spent counseling and coordinating care related to the above assessment and plan.  Willette Alma, AGPCNP-BC  Palliative Medicine Team/[facility]

## 2022-04-17 ENCOUNTER — Ambulatory Visit (HOSPITAL_COMMUNITY)
Admission: RE | Admit: 2022-04-17 | Discharge: 2022-04-17 | Disposition: A | Discharge status: Home / Self Care | Payer: 59 | Source: Ambulatory Visit | Attending: Radiation Oncology | Admitting: Radiation Oncology

## 2022-04-17 DIAGNOSIS — C7931 Secondary malignant neoplasm of brain: Secondary | ICD-10-CM | POA: Insufficient documentation

## 2022-04-17 DIAGNOSIS — G936 Cerebral edema: Secondary | ICD-10-CM | POA: Diagnosis not present

## 2022-04-17 DIAGNOSIS — G9389 Other specified disorders of brain: Secondary | ICD-10-CM | POA: Diagnosis not present

## 2022-04-17 MED ORDER — GADOBUTROL 1 MMOL/ML IV SOLN
5.8000 mL | Freq: Once | INTRAVENOUS | Status: AC | PRN
  Administered 2022-04-17: 5.8 mL via INTRAVENOUS

## 2022-04-19 ENCOUNTER — Inpatient Hospital Stay: Payer: 59

## 2022-04-21 ENCOUNTER — Ambulatory Visit
Admission: RE | Admit: 2022-04-21 | Discharge: 2022-04-21 | Disposition: A | Discharge status: Home / Self Care | Payer: 59 | Source: Ambulatory Visit | Attending: Urology | Admitting: Urology

## 2022-04-21 ENCOUNTER — Encounter: Payer: Self-pay | Admitting: Urology

## 2022-04-21 DIAGNOSIS — C7931 Secondary malignant neoplasm of brain: Secondary | ICD-10-CM | POA: Diagnosis not present

## 2022-04-21 DIAGNOSIS — C3490 Malignant neoplasm of unspecified part of unspecified bronchus or lung: Secondary | ICD-10-CM

## 2022-04-21 DIAGNOSIS — C3432 Malignant neoplasm of lower lobe, left bronchus or lung: Secondary | ICD-10-CM | POA: Diagnosis not present

## 2022-04-21 DIAGNOSIS — Z87891 Personal history of nicotine dependence: Secondary | ICD-10-CM | POA: Diagnosis not present

## 2022-04-21 NOTE — Progress Notes (Addendum)
Telephone nursing interview for patient to receive most recent MRI results from 04/17/22. I verified patient's identity and began nursing interview.  Patient reports persistent gait disturbance, muscle weakness in RT hand, and RT leg pain 7/10. Oxycodone is helping to manage this pain. No other related issues reported at this time.  Meaningful use complete.  Patient aware of her 10:00am-04/21/2022 telephone appointment w/ [name] Bruning PA-C. I left my extension [phone] in case patient needs anything. Patient verbalized understanding.   Leandra Kern, LPN

## 2022-04-22 ENCOUNTER — Telehealth: Payer: Self-pay | Admitting: Radiation Therapy

## 2022-04-22 NOTE — Telephone Encounter (Signed)
I was not able to get in touch with the patient on the phone but did speak with her daughter. I shared the appointment info for the [facility] on 1/30 @ 1:00 and requested that she communicate this with her mother for [place]. I also let her know that we are trying to assist with transportation for [name], but she needs to answer the phone for our coordinator to help her with this. [name] was thankful for the call and will share this information with her mother.   Mont Dutton R.T.(R)(T) Radiation Special Procedures Navigator

## 2022-04-22 NOTE — Progress Notes (Signed)
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   Interval Since Last Radiation:  3 months 01/13/22: SRS brain PTV 7-8// The patient received a single fraction of 20 Gy to the new right precentral gyrus and right parietal lesions       10/29/21//SRS PTV2-6:      11/09/21 - 11/13/21//SRS PTV3:    04/03/21 - 04/08/21//fractionated SRS PTV1:    Narrative:  I spoke with the patient to conduct her scheduled 3 month follow up visit to review the results of her recent post-treatment MRI brain scan via telephone to spare the patient unnecessary potential exposure in the healthcare setting during the current COVID-19 pandemic.  The patient was notified in advance and gave permission to proceed with this visit format.  She has tolerated the SRS brain treatments well, without any acute ill side effects. She reports occasional headaches, imbalance (she attributes to her recent right hip surgery) and some new weakness in the right upper extremity present for the past couple of weeks.  She has continued taking Decadron 4 mg daily despite our previous recommendation to discontinue following a taper provided to her at the completion of her most recent [facility] treatment in October 2023.  She had a recent posttreatment MRI brain scan on 04/17/2022 that shows 11 new metastatic lesions.  Her images were reviewed in the multidisciplinary conference and consensus recommendation was to consider discontinuation of the surveillance scans and transitioning to hospice care given her lack of interest in proceeding with any systemic treatment.  We again had a long discussion regarding her goals of care at this time, in attempt to help her understand that if  we treat the whole brain now, she will almost certainly continue to develop recurrent brain mets the future if she continues to decline systemic therapy.  She admits that her fear of systemic therapy is based on seeing multiple family members have had negative experiences with chemotherapy, often dying shortly after they started their treatments.  I voiced my understanding of this fear and we discussed that more often than not, death occurring shortly after initiation of a treatment is related to disease progression and may not have anything to do with the actual treatment itself.  I answered multiple questions that she had, related to her fear of treatment and we also discussed the fact that based on her initial biopsy and molecular testing, she appears to be a good candidate for immunotherapy which is generally much better tolerated than traditional chemotherapies.  I strongly encouraged her to have conversations with her family and also further conversation with Lexine Baton, NP regarding her goals of care and any other barriers that may be influencing her decision to decline systemic treatment.   On review of systems, the patient states that she is doing fair in general. She has continued taking Decadron 4 mg daily but has had intermittent headaches and imbalance as well new onset, right upper extremity weakness and occasional difficulty with word finding. She denies dizziness, tremors, seizure activity, or changes in her visual or auditory acuity. She has not had recent fevers, chills, N/V/D.  She previously had a follow-up visit with Dr. Julien Nordmann on 02/01/2022 to discuss proceeding with the recommended immunotherapy  but she ultimately declined, wishing to just continue in surveillance.  She also met with Alda Lea, NP in palliative care and has continued to follow with her as well.   ALLERGIES:  has No Known Allergies.  Meds: Current Outpatient Medications  Medication Sig Dispense Refill    acetaminophen (TYLENOL) 500 MG tablet Take 2 tablets (1,000 mg total) by mouth every 8 (eight) hours. 180 tablet 0   amoxicillin-clavulanate (AUGMENTIN) 875-125 MG tablet Take 1 tablet by mouth every 12 (twelve) hours. 14 tablet 0   dexamethasone (DECADRON) 2 MG tablet Take 1 tablet (2 mg total) by mouth daily. 30 tablet 1   gabapentin (NEURONTIN) 300 MG capsule Take 1 capsule (300 mg total) by mouth 3 (three) times daily. 90 capsule 5   ibuprofen (ADVIL) 800 MG tablet Take 1 tablet (800 mg total) by mouth every 8 (eight) hours as needed. 90 tablet 1   levETIRAcetam (KEPPRA) 500 MG tablet Take 1 tablet (500 mg total) by mouth 2 (two) times daily. 60 tablet 2   lidocaine (LIDODERM) 5 % Place 1 patch onto the skin daily. Remove & Discard patch within 12 hours or as directed by MD 30 patch 0   methocarbamol (ROBAXIN) 500 MG tablet Take 1 tablet (500 mg total) by mouth 3 (three) times daily. 60 tablet 1   naproxen (NAPROSYN) 375 MG tablet Take 1 tablet (375 mg total) by mouth 2 (two) times daily. 20 tablet 0   oxyCODONE (ROXICODONE) 5 MG immediate release tablet Take 1 tablet (5 mg total) by mouth every 6 (six) hours as needed for breakthrough pain. 45 tablet 0   pantoprazole (PROTONIX) 40 MG tablet Take 1 tablet by mouth once daily (Patient not taking: Reported on 01/13/2022) 30 tablet 0   No current facility-administered medications for this encounter.    Physical Findings:  vitals were not taken for this visit.  Pain Assessment Pain Score: 0-No pain/10 Unable to assess due to telephone follow up visit format.  Lab Findings: Lab Results  Component Value Date   WBC 12.7 (H) 04/06/2022   HGB 12.1 04/06/2022   HCT 37.8 04/06/2022   MCV 91.5 04/06/2022   PLT 459 (H) 04/06/2022     Radiographic Findings: MR Brain W Wo Contrast  Result Date: 04/18/2022 CLINICAL DATA:  Brain metastases, assess treatment response EXAM: MRI HEAD WITHOUT AND WITH CONTRAST TECHNIQUE: Multiplanar, multiecho  pulse sequences of the brain and surrounding structures were obtained without and with intravenous contrast. CONTRAST:  5.31mL GADAVIST GADOBUTROL 1 MMOL/ML IV SOLN COMPARISON:  01/03/2022 FINDINGS: BRAIN New Lesions: 1. Ring-enhancing 18 mm mass in the right cerebellum on 1100:136, with edema. 2. 4 mm in the low left cerebellum on image 122 3. Cortical right occipital temporal junction medially measuring 4 mm on image 172 4. Superior right temporal cortex measuring 4 mm on image 186 5. Punctate at the right basal ganglia on image 192 6. Punctate along the lateral right frontal cortex on image 226 7. 4 mm along the high right frontal cortex on image 250 8. 5 mm along the high left frontal cortex on image 257 9. 3 mm along the left occipital cortex on image 190 10. Punctate lesion at the left temporal occipital junction on image 196 11. Second left temporal occipital cortex lesion on the same image measuring 2 mm. Larger lesions: 1. Cystic mass centered in the left parietal lobe shows thicker and more irregular solid type enhancement superiorly and inferiorly, upper solid component measuring 2.5 cm on  axial images as opposed to 14 mm previously, measurement on image 241 2. Stellate left occipital lesion has thicker enhancement at its upper margin, length measuring 14 mm on axial slices. Stable or Smaller lesions: 1. Punctate along the parasagittal anterior right frontal lobe on 232 2. Punctate enhancement in the superior right frontal parietal cortex on 260, decreased Other Brain findings: Multifocal vasogenic edema, most exuberant in the right cerebellum and around the left parietal cystic lesions. Associated midline shift towards the right measures 7 mm. No infarct, hydrocephalus, or collection. Vascular: Major flow voids and vascular enhancements are preserved Skull and upper cervical spine: No focal marrow lesion. Sinuses/Orbits: Negative IMPRESSION: 1. Eleven interval enhancing lesions as catalogued above. The  largest is in the right cerebellum at 18 mm with moderate vasogenic edema. 2. Pre-existing lesions in the left occipital and parietal lobes show increased enhancement and edema. Midline shift measures 7 mm. 3. Two pre-existing lesions are stable or smaller. Electronically Signed   By: Jorje Guild M.D.   On: 04/18/2022 09:56   CT Angio Chest PE W and/or Wo Contrast  Result Date: 04/06/2022 CLINICAL DATA:  Lung cancer. Brain metastasis. LEFT hilar mass on prior CT. Concern for acute pulmonary embolism. * Tracking Code: BO * EXAM: CT ANGIOGRAPHY CHEST WITH CONTRAST TECHNIQUE: Multidetector CT imaging of the chest was performed using the standard protocol during bolus administration of intravenous contrast. Multiplanar CT image reconstructions and MIPs were obtained to evaluate the vascular anatomy. RADIATION DOSE REDUCTION: This exam was performed according to the departmental dose-optimization program which includes automated exposure control, adjustment of the mA and/or kV according to patient size and/or use of iterative reconstruction technique. CONTRAST:  69mL OMNIPAQUE IOHEXOL 350 MG/ML SOLN COMPARISON:  CT chest 03/05/2021 FINDINGS: Cardiovascular: No filling defects within the pulmonary arteries to suggest acute pulmonary embolism. Mediastinum/Nodes: LEFT hilar mass again demonstrated measuring 4.6 by 3.5 cm compared to 4.5 by by 3.8 cm. There is increased postobstructive pneumonitis in the lingula distal to the LEFT hilar mass. A similar obstructive pneumonitis pattern in the LEFT lower lobe as well as mucous plugging which is increased (image 92/6). Prevascular lymph node measures 8 mm similar to prior. Supraclavicular adenopathy. No axillary adenopathy Lungs/Pleura: RIGHT lung is clear. LEFT lung hilar mass described in the mediastinal section. Upper Abdomen: Limited view of the liver, kidneys, pancreas are unremarkable. Normal adrenal glands. Musculoskeletal: No aggressive osseous lesion Review of  the MIP images confirms the above findings. IMPRESSION: 1. LEFT hilar mass again demonstrated. Visually and by measurement mass is very similar to comparison exam. There is increased postobstructive pneumonitis within the lingula and LEFT lower lobe. 2. No mediastinal lymphadenopathy. 3. No evidence acute pulmonary embolism. Electronically Signed   By: Suzy Bouchard M.D.   On: 04/06/2022 12:56   DG Chest 2 View  Result Date: 04/06/2022 CLINICAL DATA:  Chest pain.  History of lung cancer EXAM: CHEST - 2 VIEW COMPARISON:  Chest 10/14/2021 FINDINGS: Interval development of infiltrate in the lingula, probable pneumonia. No effusion Right lung clear.  No heart failure or edema identified. IMPRESSION: Lingular airspace density likely pneumonia. Given the history of lung cancer, post treatment effect and tumor spread are possible. Close follow-up recommended. Electronically Signed   By: Franchot Gallo M.D.   On: 04/06/2022 09:56    Impression/Plan: 1. 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     She has recovered from the effects of her most recent  SRS brain treatment but has continued with intermittent headaches, imbalance and more recently has developed some difficulty with word finding and weakness in the right upper extremity.  Her most recent MRI brain scan from 04/17/2022 shows 11 new lesions throughout the brain.  At this point, she may benefit from brain radiation, specifically whole brain radiation given the number of new lesions present.  We also discussed the option to forego treatment and discontinue surveillance brain scans and transition to hospice/comfort care.  She is adamant that she is not ready for hospice care and desires treatment of the new brain lesions.  We had a lengthy discussion regarding our recommendation to reconsider starting immunotherapy to better manage her systemic disease, as continuing with brain radiation alone is certainly  suboptimal care, knowing that she will almost certainly continue to develop additional brain lesions in the future in the absence of any systemic therapy.   At the conclusion of our conversation, she is willing to give systemic therapy further consideration but is not ready to commit at this time.  However, she does wish to proceed with whole brain radiation.  She understands that whole brain radiotherapy would treat the known metastatic deposits and help provide some reduction of risk for future brain metastases.  We discussed the potential risks associated with whole brain radiation, including but not limited to hair loss, subacute somnolence, and neurocognitive changes including a possible reduction in short-term memory.  She understands that whole brain radiotherapy also may carry a lower likelihood of tumor control at the treatment sites because of the low-dose used but she is willing to proceed.  She will need transportation to and from her radiation planning and treatment appointments so we will work to connect her with our transportation services team here at the cancer center. She appears to have a good understanding of her disease and our treatment recommendations and is in agreement with the stated plan so we will move forward with treatment planning, in anticipation of beginning her treatments in the near future.  She will also continue in routine follow-up with Alda Lea, NP in palliative care to continue the conversation regarding consideration of immunotherapy for systemic disease control.  I personally spent 45 minutes in this encounter including chart review, reviewing radiological studies, telephone discussion with the patient, entering orders and completing documentation.     Nicholos Johns, PA-C

## 2022-04-27 ENCOUNTER — Other Ambulatory Visit: Payer: Self-pay

## 2022-04-27 ENCOUNTER — Ambulatory Visit
Admission: RE | Admit: 2022-04-27 | Discharge: 2022-04-27 | Disposition: A | Discharge status: Home / Self Care | Payer: 59 | Source: Ambulatory Visit | Attending: Radiation Oncology | Admitting: Radiation Oncology

## 2022-04-27 DIAGNOSIS — C7931 Secondary malignant neoplasm of brain: Secondary | ICD-10-CM | POA: Diagnosis not present

## 2022-04-27 DIAGNOSIS — C3490 Malignant neoplasm of unspecified part of unspecified bronchus or lung: Secondary | ICD-10-CM | POA: Diagnosis not present

## 2022-04-27 DIAGNOSIS — C3432 Malignant neoplasm of lower lobe, left bronchus or lung: Secondary | ICD-10-CM | POA: Diagnosis not present

## 2022-04-27 DIAGNOSIS — Z87891 Personal history of nicotine dependence: Secondary | ICD-10-CM | POA: Diagnosis not present

## 2022-04-27 DIAGNOSIS — Z510 Encounter for antineoplastic radiation therapy: Secondary | ICD-10-CM | POA: Diagnosis not present

## 2022-04-27 NOTE — Progress Notes (Signed)
  Radiation Oncology         [phone] ________________________________  Name: Sandra Horne MRN: [id number]  Date: 04/27/2022  DOB: 28-Aug-1955  SIMULATION AND TREATMENT PLANNING NOTE    ICD-10-CM   1. Malignant neoplasm metastatic to brain ([place])  C79.31     2. 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   NARRATIVE:  The patient was brought to the [facility].  Identity was confirmed.  All relevant records and images related to the planned course of therapy were reviewed.  The patient freely provided informed written consent to proceed with treatment after reviewing the details related to the planned course of therapy. The consent form was witnessed and verified by the simulation staff.  Then, the patient was set-up in a stable reproducible  supine position for radiation therapy.  CT images were obtained.  Surface markings were placed.  The CT images were loaded into the planning software.  Then the target and avoidance structures were contoured.  Treatment planning then occurred.  The radiation prescription was entered and confirmed.  Then, I designed and supervised the construction of a total of 3 medically necessary complex treatment device including a custom made thermoplastic mask used for immobilization, and two MLC collimator apertures for radiotherapy from the right and left side, with independent collimation for each to account for beam divergence.  I have requested : Isodose Plan.    PLAN:  The whole brain will be treated to 30 Gy in 10 fractions.  ________________________________  Sheral Apley Tammi Klippel, M.D.

## 2022-04-28 DIAGNOSIS — Z87891 Personal history of nicotine dependence: Secondary | ICD-10-CM | POA: Diagnosis not present

## 2022-04-28 DIAGNOSIS — C7931 Secondary malignant neoplasm of brain: Secondary | ICD-10-CM | POA: Diagnosis not present

## 2022-04-28 DIAGNOSIS — C3432 Malignant neoplasm of lower lobe, left bronchus or lung: Secondary | ICD-10-CM | POA: Diagnosis not present

## 2022-04-28 DIAGNOSIS — Z510 Encounter for antineoplastic radiation therapy: Secondary | ICD-10-CM | POA: Diagnosis not present

## 2022-04-28 DIAGNOSIS — C3490 Malignant neoplasm of unspecified part of unspecified bronchus or lung: Secondary | ICD-10-CM | POA: Diagnosis not present

## 2022-05-03 ENCOUNTER — Other Ambulatory Visit: Payer: Self-pay

## 2022-05-03 ENCOUNTER — Ambulatory Visit
Admission: RE | Admit: 2022-05-03 | Discharge: 2022-05-03 | Disposition: A | Discharge status: Home / Self Care | Payer: 59 | Source: Ambulatory Visit | Attending: Radiation Oncology | Admitting: Radiation Oncology

## 2022-05-03 DIAGNOSIS — C7931 Secondary malignant neoplasm of brain: Secondary | ICD-10-CM | POA: Diagnosis not present

## 2022-05-03 DIAGNOSIS — Z510 Encounter for antineoplastic radiation therapy: Secondary | ICD-10-CM | POA: Diagnosis not present

## 2022-05-03 DIAGNOSIS — C3432 Malignant neoplasm of lower lobe, left bronchus or lung: Secondary | ICD-10-CM | POA: Diagnosis not present

## 2022-05-03 DIAGNOSIS — C3490 Malignant neoplasm of unspecified part of unspecified bronchus or lung: Secondary | ICD-10-CM | POA: Diagnosis not present

## 2022-05-03 DIAGNOSIS — Z87891 Personal history of nicotine dependence: Secondary | ICD-10-CM | POA: Diagnosis not present

## 2022-05-03 LAB — RAD ONC ARIA SESSION SUMMARY
Course Elapsed Days: 0
Plan Fractions Treated to Date: 1
Plan Prescribed Dose Per Fraction: 3 Gy
Plan Total Fractions Prescribed: 10
Plan Total Prescribed Dose: 30 Gy
Reference Point Dosage Given to Date: 3 Gy
Reference Point Session Dosage Given: 3 Gy
Session Number: 1

## 2022-05-04 ENCOUNTER — Ambulatory Visit
Admission: RE | Admit: 2022-05-04 | Discharge: 2022-05-04 | Disposition: A | Discharge status: Home / Self Care | Payer: 59 | Source: Ambulatory Visit | Attending: Radiation Oncology | Admitting: Radiation Oncology

## 2022-05-04 ENCOUNTER — Inpatient Hospital Stay: Payer: 59 | Attending: Physician Assistant

## 2022-05-04 ENCOUNTER — Other Ambulatory Visit: Payer: Self-pay

## 2022-05-04 DIAGNOSIS — C3490 Malignant neoplasm of unspecified part of unspecified bronchus or lung: Secondary | ICD-10-CM | POA: Diagnosis not present

## 2022-05-04 DIAGNOSIS — Z510 Encounter for antineoplastic radiation therapy: Secondary | ICD-10-CM | POA: Diagnosis not present

## 2022-05-04 DIAGNOSIS — C7931 Secondary malignant neoplasm of brain: Secondary | ICD-10-CM | POA: Diagnosis not present

## 2022-05-04 LAB — RAD ONC ARIA SESSION SUMMARY
Course Elapsed Days: 1
Plan Fractions Treated to Date: 2
Plan Prescribed Dose Per Fraction: 3 Gy
Plan Total Fractions Prescribed: 10
Plan Total Prescribed Dose: 30 Gy
Reference Point Dosage Given to Date: 6 Gy
Reference Point Session Dosage Given: 3 Gy
Session Number: 2

## 2022-05-05 ENCOUNTER — Ambulatory Visit
Admission: RE | Admit: 2022-05-05 | Discharge: 2022-05-05 | Disposition: A | Discharge status: Home / Self Care | Payer: 59 | Source: Ambulatory Visit | Attending: Radiation Oncology | Admitting: Radiation Oncology

## 2022-05-05 ENCOUNTER — Inpatient Hospital Stay: Payer: 59

## 2022-05-05 ENCOUNTER — Other Ambulatory Visit: Payer: Self-pay

## 2022-05-05 DIAGNOSIS — C7931 Secondary malignant neoplasm of brain: Secondary | ICD-10-CM | POA: Diagnosis not present

## 2022-05-05 DIAGNOSIS — C3490 Malignant neoplasm of unspecified part of unspecified bronchus or lung: Secondary | ICD-10-CM | POA: Diagnosis not present

## 2022-05-05 DIAGNOSIS — Z510 Encounter for antineoplastic radiation therapy: Secondary | ICD-10-CM | POA: Diagnosis not present

## 2022-05-05 LAB — RAD ONC ARIA SESSION SUMMARY
Course Elapsed Days: 2
Plan Fractions Treated to Date: 3
Plan Prescribed Dose Per Fraction: 3 Gy
Plan Total Fractions Prescribed: 10
Plan Total Prescribed Dose: 30 Gy
Reference Point Dosage Given to Date: 9 Gy
Reference Point Session Dosage Given: 3 Gy
Session Number: 3

## 2022-05-06 ENCOUNTER — Inpatient Hospital Stay: Payer: 59

## 2022-05-06 ENCOUNTER — Inpatient Hospital Stay: Payer: 59 | Admitting: Nurse Practitioner

## 2022-05-06 ENCOUNTER — Ambulatory Visit: Payer: 59

## 2022-05-06 NOTE — Progress Notes (Signed)
[facility]  Telephone:([phone]) [phone] Fax:([phone]) [phone]   Name: Sandra Horne Date: 05/06/2022 MRN: [id number]  DOB: 1955/11/19  Patient Care Team: Pcp, No as PCP - General Pickenpack-Cousar, Carlena Sax, NP as Nurse Practitioner (Nurse Practitioner)   I connected with [place] on 05/06/22 at 11:00 AM EST by phone and verified that I am speaking with the correct person using two identifiers.   I discussed the limitations, risks, security and privacy concerns of performing an evaluation and management service by telemedicine and the availability of in-person appointments. I also discussed with the patient that there may be a patient responsible charge related to this service. The patient expressed understanding and agreed to proceed.   Other persons participating in the visit and their role in the encounter: Silvana Holecek, RN   Patient's location: home  Provider[facility]  Chief Complaint: follow up of symptom management and goals of care     INTERVAL HISTORY: [name] is a 67 y.o. female with oncologic medical history including primary lung CA (left infrahilar mass dx Sept 2021 but lost to f/u, last radiation treatment 10/29/2021), chronic anemia, right femoral neck fracture (s/p right hemiarthroplasty on 10/13/2021) and tobacco abuse. She was recently admitted on 10/31/2021 with new right-sided weakness. CT of head revealed left parietal lobe mass with vasogenic edema. She is currently undergoing SRS.  Palliative ask to see for symptom management and goals of care.   SOCIAL HISTORY:     reports that she has been smoking cigarettes. She has a 10.00 pack-year smoking history. She has never used smokeless tobacco. She reports that she does not currently use alcohol. She reports that she does not currently use drugs after having used the following drugs: Marijuana.  ADVANCE DIRECTIVES:    CODE STATUS:   PAST MEDICAL HISTORY: Past  Medical History:  Diagnosis Date   Cancer ([place])    Lung and Brain mets    ALLERGIES:  has No Known Allergies.  MEDICATIONS:  Current Outpatient Medications  Medication Sig Dispense Refill   acetaminophen (TYLENOL) 500 MG tablet Take 2 tablets (1,000 mg total) by mouth every 8 (eight) hours. 180 tablet 0   amoxicillin-clavulanate (AUGMENTIN) 875-125 MG tablet Take 1 tablet by mouth every 12 (twelve) hours. 14 tablet 0   dexamethasone (DECADRON) 2 MG tablet Take 1 tablet (2 mg total) by mouth daily. 30 tablet 1   gabapentin (NEURONTIN) 300 MG capsule Take 1 capsule (300 mg total) by mouth 3 (three) times daily. 90 capsule 5   ibuprofen (ADVIL) 800 MG tablet Take 1 tablet (800 mg total) by mouth every 8 (eight) hours as needed. 90 tablet 1   levETIRAcetam (KEPPRA) 500 MG tablet Take 1 tablet (500 mg total) by mouth 2 (two) times daily. 60 tablet 2   lidocaine (LIDODERM) 5 % Place 1 patch onto the skin daily. Remove & Discard patch within 12 hours or as directed by MD 30 patch 0   methocarbamol (ROBAXIN) 500 MG tablet Take 1 tablet (500 mg total) by mouth 3 (three) times daily. 60 tablet 1   naproxen (NAPROSYN) 375 MG tablet Take 1 tablet (375 mg total) by mouth 2 (two) times daily. 20 tablet 0   oxyCODONE (ROXICODONE) 5 MG immediate release tablet Take 1 tablet (5 mg total) by mouth every 6 (six) hours as needed for breakthrough pain. 45 tablet 0   pantoprazole (PROTONIX) 40 MG tablet Take 1 tablet by mouth once daily (Patient  not taking: Reported on 01/13/2022) 30 tablet 0   No current facility-administered medications for this visit.    VITAL SIGNS: There were no vitals taken for this visit. There were no vitals filed for this visit.  Estimated body mass index is 24.21 kg/m as calculated from the following:   Height as of 04/06/22: 5\' 6"  (1.676 m).   Weight as of 04/06/22: 150 lb (68 kg).   PERFORMANCE STATUS (ECOG) : 1 - Symptomatic but completely  ambulatory    IMPRESSION:    Goals of Care (02/01/2022) I created space and opportunity for patient to express thoughts and feelings in regards to her current illness.  [name] states she is ready to return home as she is currently living with her daughter.  Feels that she is doing much better and does not wish to pursue treatments and take the risk of feeling poorly again.  Education provided on risk of disease progression in the setting of no interventions.  She verbalized understanding again stating she wishes to continue taking things 1 day at a time allowing herself every opportunity to continue to thrive is much as possible while feeling good.  She is much appreciative of her current quality of life.  She understands if her disease progresses she may become more symptomatic and not be a candidate for further treatment.  She is clear in her expressed wishes that she will address that when the time comes.   PLAN: Ongoing goals of care discussions.  Patient continues to be clear in her expressed wishes that she is not interested in pursuing further treatment at this time.  Is appreciative of her current quality of life. She has recently gotten married and taking things one day at a time.  I had an open and honest discussion with her regarding high likelihood of disease progression and increase in symptoms.  She verbalized understanding. Declines  Oxycodone 5mg  as needed every 6 hours as needed  I will plan to see patient back in 4-6 weeks.  She knows to contact our office sooner if needed.   Patient expressed understanding and was in agreement with this plan. She also understands that She can call the clinic at any time with any questions, concerns, or complaints.   Any controlled substances utilized were prescribed in the context of palliative care. PDMP has been reviewed.    Time Total: 50 min  Visit consisted of counseling and education dealing with the complex and emotionally intense  issues of symptom management and palliative care in the setting of serious and potentially life-threatening illness.Greater than 50%  of this time was spent counseling and coordinating care related to the above assessment and plan.  Alda Lea, AGPCNP-BC  Palliative Medicine Team/[place]

## 2022-05-07 ENCOUNTER — Telehealth: Payer: Self-pay | Admitting: Radiation Therapy

## 2022-05-07 ENCOUNTER — Telehealth: Payer: Self-pay

## 2022-05-07 ENCOUNTER — Inpatient Hospital Stay: Payer: 59

## 2022-05-07 ENCOUNTER — Ambulatory Visit: Payer: 59

## 2022-05-07 NOTE — Telephone Encounter (Signed)
RN called patient to see if she would make her afternoon appointment for radiation treatment.  No response and voicemail is full so unable to leave message at this time.

## 2022-05-07 NOTE — Telephone Encounter (Signed)
I was asked to reach out to Sandra Horne because she has been a no call no show for the past two days. (Current treatment is whole brain radiation under the care of Dr. Tyler Pita. She has received 3 of the planned 10 treatments.)   I spoke with the patient and her daughter. Sandra Horne has not been feeling well the past two days. She said she is weak and tired, been in the bed all day. I encouraged her to come Monday even if she is not feeling well so she can be assessed by the doctor. They agreed.   Mont Dutton R.T.(R)(T) Radiation Special Procedures Navigator

## 2022-05-08 ENCOUNTER — Encounter (HOSPITAL_COMMUNITY): Payer: Self-pay | Admitting: Emergency Medicine

## 2022-05-08 ENCOUNTER — Emergency Department (HOSPITAL_COMMUNITY): Payer: 59

## 2022-05-08 ENCOUNTER — Other Ambulatory Visit: Payer: Self-pay

## 2022-05-08 ENCOUNTER — Observation Stay (HOSPITAL_COMMUNITY)
Admission: EM | Admit: 2022-05-08 | Discharge: 2022-05-12 | Disposition: A | Discharge status: Skilled Nursing Facility | Payer: 59 | Attending: Internal Medicine | Admitting: Internal Medicine

## 2022-05-08 DIAGNOSIS — Z515 Encounter for palliative care: Secondary | ICD-10-CM

## 2022-05-08 DIAGNOSIS — Z720 Tobacco use: Secondary | ICD-10-CM | POA: Diagnosis present

## 2022-05-08 DIAGNOSIS — R509 Fever, unspecified: Secondary | ICD-10-CM | POA: Diagnosis not present

## 2022-05-08 DIAGNOSIS — R29818 Other symptoms and signs involving the nervous system: Secondary | ICD-10-CM | POA: Diagnosis not present

## 2022-05-08 DIAGNOSIS — C3492 Malignant neoplasm of unspecified part of left bronchus or lung: Principal | ICD-10-CM | POA: Insufficient documentation

## 2022-05-08 DIAGNOSIS — E876 Hypokalemia: Secondary | ICD-10-CM | POA: Diagnosis not present

## 2022-05-08 DIAGNOSIS — R52 Pain, unspecified: Secondary | ICD-10-CM

## 2022-05-08 DIAGNOSIS — C7931 Secondary malignant neoplasm of brain: Secondary | ICD-10-CM

## 2022-05-08 DIAGNOSIS — D696 Thrombocytopenia, unspecified: Secondary | ICD-10-CM

## 2022-05-08 DIAGNOSIS — Z96641 Presence of right artificial hip joint: Secondary | ICD-10-CM | POA: Diagnosis not present

## 2022-05-08 DIAGNOSIS — Z79899 Other long term (current) drug therapy: Secondary | ICD-10-CM | POA: Diagnosis not present

## 2022-05-08 DIAGNOSIS — R918 Other nonspecific abnormal finding of lung field: Secondary | ICD-10-CM | POA: Diagnosis not present

## 2022-05-08 DIAGNOSIS — C3490 Malignant neoplasm of unspecified part of unspecified bronchus or lung: Secondary | ICD-10-CM | POA: Diagnosis present

## 2022-05-08 DIAGNOSIS — R059 Cough, unspecified: Secondary | ICD-10-CM | POA: Diagnosis not present

## 2022-05-08 DIAGNOSIS — K5900 Constipation, unspecified: Secondary | ICD-10-CM | POA: Diagnosis not present

## 2022-05-08 DIAGNOSIS — Z7189 Other specified counseling: Secondary | ICD-10-CM

## 2022-05-08 DIAGNOSIS — Z7982 Long term (current) use of aspirin: Secondary | ICD-10-CM | POA: Insufficient documentation

## 2022-05-08 DIAGNOSIS — R531 Weakness: Secondary | ICD-10-CM

## 2022-05-08 DIAGNOSIS — R062 Wheezing: Secondary | ICD-10-CM | POA: Diagnosis not present

## 2022-05-08 DIAGNOSIS — R519 Headache, unspecified: Secondary | ICD-10-CM | POA: Diagnosis not present

## 2022-05-08 DIAGNOSIS — F17210 Nicotine dependence, cigarettes, uncomplicated: Secondary | ICD-10-CM | POA: Diagnosis not present

## 2022-05-08 DIAGNOSIS — G936 Cerebral edema: Secondary | ICD-10-CM

## 2022-05-08 LAB — CBC WITH DIFFERENTIAL/PLATELET
Abs Immature Granulocytes: 0.14 10*3/uL — ABNORMAL HIGH (ref 0.00–0.07)
Basophils Absolute: 0.1 10*3/uL (ref 0.0–0.1)
Basophils Relative: 1 %
Eosinophils Absolute: 0.1 10*3/uL (ref 0.0–0.5)
Eosinophils Relative: 1 %
HCT: 39.6 % (ref 36.0–46.0)
Hemoglobin: 12.3 g/dL (ref 12.0–15.0)
Immature Granulocytes: 1 %
Lymphocytes Relative: 30 %
Lymphs Abs: 3 10*3/uL (ref 0.7–4.0)
MCH: 28.3 pg (ref 26.0–34.0)
MCHC: 31.1 g/dL (ref 30.0–36.0)
MCV: 91 fL (ref 80.0–100.0)
Monocytes Absolute: 1.2 10*3/uL — ABNORMAL HIGH (ref 0.1–1.0)
Monocytes Relative: 12 %
Neutro Abs: 5.5 10*3/uL (ref 1.7–7.7)
Neutrophils Relative %: 55 %
Platelets: 445 10*3/uL — ABNORMAL HIGH (ref 150–400)
RBC: 4.35 MIL/uL (ref 3.87–5.11)
RDW: 14.8 % (ref 11.5–15.5)
WBC: 10 10*3/uL (ref 4.0–10.5)
nRBC: 0 % (ref 0.0–0.2)

## 2022-05-08 LAB — COMPREHENSIVE METABOLIC PANEL
ALT: 18 U/L (ref 0–44)
AST: 14 U/L — ABNORMAL LOW (ref 15–41)
Albumin: 3 g/dL — ABNORMAL LOW (ref 3.5–5.0)
Alkaline Phosphatase: 56 U/L (ref 38–126)
Anion gap: 5 (ref 5–15)
BUN: 11 mg/dL (ref 8–23)
CO2: 24 mmol/L (ref 22–32)
Calcium: 7.7 mg/dL — ABNORMAL LOW (ref 8.9–10.3)
Chloride: 108 mmol/L (ref 98–111)
Creatinine, Ser: 0.68 mg/dL (ref 0.44–1.00)
GFR, Estimated: >60 mL/min (ref >60)
Glucose, Bld: 106 mg/dL — ABNORMAL HIGH (ref 70–99)
Potassium: 2.8 mmol/L — ABNORMAL LOW (ref 3.5–5.1)
Sodium: 137 mmol/L (ref 135–145)
Total Bilirubin: 0.8 mg/dL (ref 0.3–1.2)
Total Protein: 6.1 g/dL — ABNORMAL LOW (ref 6.5–8.1)

## 2022-05-08 LAB — RAPID URINE DRUG SCREEN, HOSP PERFORMED
Amphetamines: NOT DETECTED
Barbiturates: NOT DETECTED
Benzodiazepines: NOT DETECTED
Cocaine: NOT DETECTED
Opiates: NOT DETECTED
Tetrahydrocannabinol: POSITIVE — AB

## 2022-05-08 LAB — URINALYSIS, ROUTINE W REFLEX MICROSCOPIC
Bilirubin Urine: NEGATIVE
Glucose, UA: NEGATIVE mg/dL
Hgb urine dipstick: NEGATIVE
Ketones, ur: NEGATIVE mg/dL
Leukocytes,Ua: NEGATIVE
Nitrite: NEGATIVE
Protein, ur: NEGATIVE mg/dL
Specific Gravity, Urine: 1.013 (ref 1.005–1.030)
pH: 6 (ref 5.0–8.0)

## 2022-05-08 LAB — MAGNESIUM: Magnesium: 1.8 mg/dL (ref 1.7–2.4)

## 2022-05-08 MED ORDER — DEXAMETHASONE 4 MG PO TABS: 2.0000 mg | ORAL_TABLET | Freq: Every day | ORAL | Status: DC

## 2022-05-08 MED ORDER — ONDANSETRON HCL 4 MG PO TABS: 4.0000 mg | ORAL_TABLET | Freq: Four times a day (QID) | ORAL | Status: DC | PRN

## 2022-05-08 MED ORDER — ONDANSETRON HCL 4 MG/2ML IJ SOLN: 4.0000 mg | Freq: Four times a day (QID) | INTRAMUSCULAR | Status: DC | PRN

## 2022-05-08 MED ORDER — NICOTINE 14 MG/24HR TD PT24
14.0000 mg | MEDICATED_PATCH | Freq: Every day | TRANSDERMAL | Status: DC
  Administered 2022-05-08 – 2022-05-12 (×5): 14 mg via TRANSDERMAL
  Filled 2022-05-08 (×5): qty 1

## 2022-05-08 MED ORDER — DEXAMETHASONE SODIUM PHOSPHATE 10 MG/ML IJ SOLN
10.0000 mg | Freq: Once | INTRAMUSCULAR | Status: AC
  Administered 2022-05-08: 10 mg via INTRAVENOUS
  Filled 2022-05-08: qty 1

## 2022-05-08 MED ORDER — ENOXAPARIN SODIUM 40 MG/0.4ML IJ SOSY
40.0000 mg | PREFILLED_SYRINGE | INTRAMUSCULAR | Status: DC
  Administered 2022-05-08 – 2022-05-11 (×4): 40 mg via SUBCUTANEOUS
  Filled 2022-05-08 (×4): qty 0.4

## 2022-05-08 MED ORDER — SODIUM CHLORIDE 0.9 % IV SOLN: INTRAVENOUS | Status: AC

## 2022-05-08 MED ORDER — POTASSIUM CHLORIDE 10 MEQ/100ML IV SOLN
10.0000 meq | Freq: Once | INTRAVENOUS | Status: AC
  Administered 2022-05-08: 10 meq via INTRAVENOUS
  Filled 2022-05-08: qty 100

## 2022-05-08 MED ORDER — OXYCODONE HCL 5 MG PO TABS
5.0000 mg | ORAL_TABLET | ORAL | Status: DC | PRN
  Administered 2022-05-08 – 2022-05-12 (×9): 5 mg via ORAL
  Filled 2022-05-08 (×9): qty 1

## 2022-05-08 MED ORDER — DEXAMETHASONE 4 MG PO TABS
5.0000 mg | ORAL_TABLET | Freq: Every day | ORAL | Status: DC
  Administered 2022-05-09 – 2022-05-11 (×3): 5 mg via ORAL
  Filled 2022-05-08 (×3): qty 2

## 2022-05-08 MED ORDER — ACETAMINOPHEN 650 MG RE SUPP: 650.0000 mg | Freq: Four times a day (QID) | RECTAL | Status: DC | PRN

## 2022-05-08 MED ORDER — ACETAMINOPHEN 325 MG PO TABS: 650.0000 mg | ORAL_TABLET | Freq: Four times a day (QID) | ORAL | Status: DC | PRN

## 2022-05-08 MED ORDER — POTASSIUM CHLORIDE CRYS ER 20 MEQ PO TBCR
40.0000 meq | EXTENDED_RELEASE_TABLET | Freq: Once | ORAL | Status: AC
  Administered 2022-05-08: 40 meq via ORAL
  Filled 2022-05-08: qty 2

## 2022-05-08 MED ORDER — METHOCARBAMOL 500 MG PO TABS
500.0000 mg | ORAL_TABLET | Freq: Three times a day (TID) | ORAL | Status: DC
  Administered 2022-05-08 – 2022-05-12 (×11): 500 mg via ORAL
  Filled 2022-05-08 (×11): qty 1

## 2022-05-08 MED ORDER — GABAPENTIN 300 MG PO CAPS
300.0000 mg | ORAL_CAPSULE | Freq: Three times a day (TID) | ORAL | Status: DC
  Administered 2022-05-08 – 2022-05-12 (×11): 300 mg via ORAL
  Filled 2022-05-08 (×11): qty 1

## 2022-05-08 NOTE — Assessment & Plan Note (Signed)
Likely secondary to poor PO intake, no exogenous losses Magnesium wnl Repleted in ED Trend

## 2022-05-08 NOTE — H&P (Signed)
History and Physical    Patient: Sandra Horne JME:[id number] DOB: 1955-11-13 DOA: 05/08/2022 DOS: the patient was seen and examined on 05/08/2022 PCP: Pcp, No  Patient coming from: Home - lives with daughter and husband. Uses walker and needs assistance    Chief Complaint: weakness   HPI: Sandra Horne is a 67 y.o. female with medical history significant of primary lung cancer with mets to brain with right sided weakness on radiation treatment, right femoral neck fracture s/p right hemiarthroplasty 09/2021 and tobacco abuse (recently quit) but note from 2/8 still smoking who presented to ED with complaints of worsening weakness x 3 days. She has baseline right sided weakness, but this has also progressed to where she can't walk.  She is on oral decadron daily. She is currently receiving whole brain radiation under Dr. Tammi Klippel and has received 3 of the planned 10 treatments. Husband denies any confusion. She does admit to poor PO intake and has not eaten anything in the past 2 days.   She was seen by radiation oncology on 04/21/22 following repeat MRI brain that showed 11 new metastatic lesions. Consensus recommended transitioning to hospice care given her lack of interest in systemic treatment. She again wanted to think about systemic treatment on 04/21/22. She is now saying that never happened, but she could have some memory loss from recent whole brain radiation.   Denies any fever/chills, vision changes/headaches, chest pain or palpitations, shortness of breath or cough, abdominal pain, N/V/D, dysuria or leg swelling.   She states she quit smoking; however, would like nicotine patch. No alcohol.    ER Course:  vitals: afebrile, bp: 127/76, HR; 87, RR: 18, oxygen: 98%RA Pertinent labs: potassium: 2.8, normal magnesium  CT head: Mildly increased brain edema associated with known metastases, including extensive edema throughout the left cerebral hemisphere with increased rightward  midline shift (now 10 mm). 2. Mildly increased right cerebellar edema with partial effacement of the fourth ventricle. No obstructive hydrocephalus. CXR : Left hilar mass and patchy opacities at the left lung base, the latter finding suspected to be postobstructive pneumonitis. Overall, no change from the previous day's chest CT. In ED: oncology consulted. Given decadron 10mg , potassium oral and IV and TRH asked to admit.   Review of Systems: As mentioned in the history of present illness. All other systems reviewed and are negative. Past Medical History:  Diagnosis Date   Cancer ([place])    Lung and Brain mets   Past Surgical History:  Procedure Laterality Date   BRONCHIAL BRUSHINGS  03/26/2021   Procedure: BRONCHIAL BRUSHINGS;  Surgeon: Margaretha Seeds, MD;  Location: [facility];  Service: Cardiopulmonary;;   BRONCHIAL NEEDLE ASPIRATION BIOPSY  03/26/2021   Procedure: BRONCHIAL NEEDLE ASPIRATION BIOPSIES;  Surgeon: Margaretha Seeds, MD;  Location: [facility];  Service: Cardiopulmonary;;   BRONCHIAL WASHINGS  03/26/2021   Procedure: BRONCHIAL WASHINGS;  Surgeon: Margaretha Seeds, MD;  Location: [facility];  Service: Cardiopulmonary;;   ECTOPIC PREGNANCY SURGERY     ENDOBRONCHIAL ULTRASOUND Bilateral 03/26/2021   Procedure: ENDOBRONCHIAL ULTRASOUND;  Surgeon: Margaretha Seeds, MD;  Location: [facility];  Service: Cardiopulmonary;  Laterality: Bilateral;   HEMOSTASIS CONTROL  03/26/2021   Procedure: HEMOSTASIS CONTROL;  Surgeon: Margaretha Seeds, MD;  Location: [facility];  Service: Cardiopulmonary;;   HIP ARTHROPLASTY Right 10/13/2021   Procedure: ARTHROPLASTY BIPOLAR HIP (HEMIARTHROPLASTY);  Surgeon: Carole Civil, MD;  Location: [facility];  Service: Orthopedics;  Laterality: Right;   Social History:  reports  that she has been smoking cigarettes. She has a 10.00 pack-year smoking history. She has never used smokeless tobacco. She reports that she does not currently use  alcohol. She reports that she does not currently use drugs after having used the following drugs: Marijuana.  No Known Allergies  Family History  Problem Relation Age of Onset   Hypertension Mother    Stroke Mother    Hypertension Father    Cancer Sister    Cancer Paternal Uncle     Prior to Admission medications   Medication Sig Start Date End Date Taking? Authorizing Provider  amoxicillin-clavulanate (AUGMENTIN) 875-125 MG tablet Take 1 tablet by mouth every 12 (twelve) hours. 04/06/22   Kommor, Madison, MD  dexamethasone (DECADRON) 2 MG tablet Take 1 tablet (2 mg total) by mouth daily. 03/04/22   Pickenpack-Cousar, Carlena Sax, NP  gabapentin (NEURONTIN) 300 MG capsule Take 1 capsule (300 mg total) by mouth 3 (three) times daily. 01/13/22   Carole Civil, MD  ibuprofen (ADVIL) 800 MG tablet Take 1 tablet (800 mg total) by mouth every 8 (eight) hours as needed. 01/13/22   Carole Civil, MD  levETIRAcetam (KEPPRA) 500 MG tablet Take 1 tablet (500 mg total) by mouth 2 (two) times daily. 11/03/21 02/01/22  Barb Merino, MD  lidocaine (LIDODERM) 5 % Place 1 patch onto the skin daily. Remove & Discard patch within 12 hours or as directed by MD 04/06/22   Kommor, Debe Coder, MD  methocarbamol (ROBAXIN) 500 MG tablet Take 1 tablet (500 mg total) by mouth 3 (three) times daily. 01/13/22   Carole Civil, MD  naproxen (NAPROSYN) 375 MG tablet Take 1 tablet (375 mg total) by mouth 2 (two) times daily. 04/06/22   Kommor, Madison, MD  oxyCODONE (ROXICODONE) 5 MG immediate release tablet Take 1 tablet (5 mg total) by mouth every 6 (six) hours as needed for breakthrough pain. 04/15/22   Pickenpack-Cousar, Carlena Sax, NP  pantoprazole (PROTONIX) 40 MG tablet Take 1 tablet by mouth once daily Patient not taking: Reported on 01/13/2022 01/05/22   Tyler Pita, MD    Physical Exam: Vitals:   05/08/22 1605 05/08/22 1645 05/08/22 1925 05/08/22 2014  BP: 127/76 124/75 131/77 114/76  Pulse: 87 78 75  85  Resp: 18 18 17 18   Temp: 99.2 F (37.3 C)  98.6 F (37 C) 98.4 F (36.9 C)  TempSrc: Oral  Oral Oral  SpO2: 98% 96% 98% 96%  Weight:      Height:       General:  Appears calm and comfortable and is in NAD Eyes:  PERRL, EOMI, normal lids, iris ENT:  grossly normal hearing, lips & tongue, mmm; appropriate dentition Neck:  no LAD, masses or thyromegaly; no carotid bruits Cardiovascular:  RRR, no m/r/g. No LE edema.  Respiratory:   CTA bilaterally with no wheezes/rales/rhonchi.  Normal respiratory effort. Abdomen:  soft, NT, ND, NABS Back:   normal alignment, no CVAT Skin:  no rash or induration seen on limited exam Musculoskeletal:  RUE and RLE weakness and decreased tone. RLE: 2/5 and RUE: 3/5. LUE and LLE: 5/5.  Lower extremity:  No LE edema.  Limited foot exam with no ulcerations.  2+ distal pulses. Psychiatric:  grossly normal mood and affect, speech fluent and appropriate, Aox2  Neurologic:  CN 2-12 grossly intact but CN 3 weak on right, sensation intact.    Radiological Exams on Admission: Independently reviewed - see discussion in A/P where applicable  CT Head Wo Contrast  Result Date: 05/08/2022 CLINICAL DATA:  Headache, neuro deficit. History of lung cancer with brain metastases. EXAM: CT HEAD WITHOUT CONTRAST TECHNIQUE: Contiguous axial images were obtained from the base of the skull through the vertex without intravenous contrast. RADIATION DOSE REDUCTION: This exam was performed according to the departmental dose-optimization program which includes automated exposure control, adjustment of the mA and/or kV according to patient size and/or use of iterative reconstruction technique. COMPARISON:  Head MRI 04/17/2022 FINDINGS: Brain: A known 2 cm cystic metastasis is again seen in the left parietal lobe. Extensive vasogenic edema throughout the left cerebral hemisphere has mildly worsened with increased rightward midline shift, now measuring 10 mm (previously 8 mm). There is  partial effacement of the left lateral ventricle. There is no evidence of intracapsular trapping. Mild edema associated with a metastasis in the posterior right temporal lobe has also likely mildly increased without associated mass effect. There is an approximately 2 cm lesion in the right cerebellar hemisphere without frank enlargement compared to the prior MRI, however prominent right cerebellar edema has likely mildly increased with increased mass effect on the fourth ventricle and mild mass effect on the right dorsal pons. Minimal left cerebellar edema has not significantly changed. No acute cortical infarct, intracranial hemorrhage, or extra-axial fluid collection is evident. Vascular: Calcified atherosclerosis at the skull base. No hyperdense vessel. Skull: No acute fracture or suspicious osseous lesion. Sinuses/Orbits: Visualized paranasal sinuses and mastoid air cells are clear. Were its are unremarkable. Other: None. IMPRESSION: 1. Mildly increased brain edema associated with known metastases, including extensive edema throughout the left cerebral hemisphere with increased rightward midline shift (now 10 mm). 2. Mildly increased right cerebellar edema with partial effacement of the fourth ventricle. No obstructive hydrocephalus. Electronically Signed   By: Logan Bores M.D.   On: 05/08/2022 17:51   DG Chest 2 View  Result Date: 05/08/2022 CLINICAL DATA:  Cough and wheezing. EXAM: CHEST - 2 VIEW COMPARISON:  04/06/2022, chest radiographs and chest CT. FINDINGS: Patchy opacity noted at the left lung base or spine Stu the airspace opacities noted on the previous day's CT. Remainder of the lungs is clear. Cardiac silhouette is normal in size. No mediastinal masses. Left hilar prominence corresponds to the mass noted on the previous day's CT. Normal right hilar contours. No pleural effusion or pneumothorax. Skeletal structures are intact. IMPRESSION: 1. Left hilar mass and patchy opacities at the left lung  base, the latter finding suspected to be postobstructive pneumonitis. Overall, no change from the previous day's chest CT. Electronically Signed   By: Lajean Manes M.D.   On: 05/08/2022 17:09    EKG: Independently reviewed.  NSR with rate 82; nonspecific ST changes with no evidence of acute ischemia   Labs on Admission: I have personally reviewed the available labs and imaging studies at the time of the admission.  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 Notes from last office visit with rad-onc below. She has declined systemic therapy as recently as end of January and has been seeing palliative care outpatient. Currently completed 3/10 whole brain radiation treatments. Missed her last 2 appointments due to weakness.   -She had a recent posttreatment MRI brain scan on 04/17/2022 that shows 11 new metastatic lesions.  Her images were reviewed in the multidisciplinary conference and consensus recommendation was to consider discontinuation of the surveillance scans and transitioning to hospice care given her lack of interest in proceeding with any systemic treatment.  (Rad-onc note from 04/21/22)  -given IV decadron in ED, start oral decadron 5mg  daily tomorrow -gentle IVF -continue oxycodone prn  -will send message to her primary oncologist, Dr. Inda Merlin -radiation-oncology consulted by EDP -palliative consult placed    Hypokalemia Likely secondary to poor PO intake, no exogenous losses Magnesium wnl Repleted in ED Trend  Tobacco abuse Nicotine patch   Thrombocytopenia ([place]) In setting of malignancy Continue ASA     Advance Care Planning:   Code Status: Full Code   Consults: oncology-vaslow and palliative care   DVT Prophylaxis: lovenox   Family Communication: husband at bedside   Severity of Illness: The appropriate patient status for this patient is OBSERVATION. Observation status is judged to be reasonable and necessary in order to provide the required intensity of service to ensure the patient's safety. The patient's presenting symptoms, physical exam findings, and initial radiographic and laboratory data in the context of their medical condition is felt to place them at decreased risk for further clinical deterioration. Furthermore, it is anticipated that the patient will be medically stable for discharge from the hospital within 2 midnights of admission.   Author: Orma Flaming, MD 05/08/2022 8:38 PM  For on call review [email or web address].

## 2022-05-08 NOTE — Assessment & Plan Note (Signed)
67 year old female with known primary lung cancer with mets to brain with right sided weakness from edema who presented to ED with worsening weakness on the right side -CT head shows mildly increased brain edema with increased rightward midline shift and mildly increased right cerebellar edema with partial effacement of the fourth ventricle -obs to tele -rad-onc consulted and recommended decadron IV and increasing her oral decadron to 4-5mg  (sounds like she has been taking 4mg  already) and palliative care consult. Notes from last office visit with rad-onc below. She has declined systemic therapy as recently as end of January and has been seeing palliative care outpatient. Currently completed 3/10 whole brain radiation treatments. Missed her last 2 appointments due to weakness.   -She had a recent posttreatment MRI brain scan on 04/17/2022 that shows 11 new metastatic lesions.  Her images were reviewed in the multidisciplinary conference and consensus recommendation was to consider discontinuation of the surveillance scans and transitioning to hospice care given her lack of interest in proceeding with any systemic treatment. (Rad-onc note from 04/21/22)  -given IV decadron in ED, start oral decadron 5mg  daily tomorrow -gentle IVF -continue oxycodone prn  -will send message to her primary oncologist, Dr. Inda Merlin -radiation-oncology consulted by EDP -palliative consult placed

## 2022-05-08 NOTE — Assessment & Plan Note (Signed)
In setting of malignancy Continue ASA

## 2022-05-08 NOTE — ED Triage Notes (Signed)
Patient BIB EMS from home c/o weakness x 3 days. Per report pt had her chemo treatment last Wednesday  and since then she got very weak and unable to do ADL;s. 350ML NS given by EMS. Pt hx of lung Ca.   BP 112/70 HR 92  RR 20  O2sat 98% on RA

## 2022-05-08 NOTE — Assessment & Plan Note (Signed)
Nicotine patch  

## 2022-05-08 NOTE — ED Provider Notes (Signed)
[facility] Provider Note   CSN: [id number] Arrival date & time: 05/08/22  1551     History  Chief Complaint  Patient presents with   Weakness    Sandra Horne is a 67 y.o. female.  Patient is a 67 year old female who presents with weakness.  She has a history of metastatic lung cancer with brain metastases.  On chart review it appears that she is getting radiation treatments currently that seems to be more of a palliative treatment.  She is getting seen by palliative care.  The notes from palliative care states that she does not want to get any more active treatments but it does look like she is getting radiation treatments.  She missed her last 2 treatments.  She states that she has had some ongoing weakness in her right side but over the last 2 or 3 days she has not been able to walk at all due to the worsening weakness in her right side.  She also has been having a headache for the last few days.  She denies any recent falls.  She says she had previously been walking with a walker but has not really been able to get out of bed recently.  She denies any fevers.  He has a little bit of cough.  No other congestion.  No vomiting or diarrhea.  She is currently not complaining of any pain anywhere.  She denies any chest pain or shortness of breath.  She also says she has been having some increased difficulty getting her words out.       Home Medications Prior to Admission medications   Medication Sig Start Date End Date Taking? Authorizing Provider  amoxicillin-clavulanate (AUGMENTIN) 875-125 MG tablet Take 1 tablet by mouth every 12 (twelve) hours. 04/06/22   Kommor, Madison, MD  dexamethasone (DECADRON) 2 MG tablet Take 1 tablet (2 mg total) by mouth daily. 03/04/22   Pickenpack-Cousar, Carlena Sax, NP  gabapentin (NEURONTIN) 300 MG capsule Take 1 capsule (300 mg total) by mouth 3 (three) times daily. 01/13/22   Carole Civil, MD   ibuprofen (ADVIL) 800 MG tablet Take 1 tablet (800 mg total) by mouth every 8 (eight) hours as needed. 01/13/22   Carole Civil, MD  levETIRAcetam (KEPPRA) 500 MG tablet Take 1 tablet (500 mg total) by mouth 2 (two) times daily. 11/03/21 02/01/22  Barb Merino, MD  lidocaine (LIDODERM) 5 % Place 1 patch onto the skin daily. Remove & Discard patch within 12 hours or as directed by MD 04/06/22   Kommor, Debe Coder, MD  methocarbamol (ROBAXIN) 500 MG tablet Take 1 tablet (500 mg total) by mouth 3 (three) times daily. 01/13/22   Carole Civil, MD  naproxen (NAPROSYN) 375 MG tablet Take 1 tablet (375 mg total) by mouth 2 (two) times daily. 04/06/22   Kommor, Madison, MD  oxyCODONE (ROXICODONE) 5 MG immediate release tablet Take 1 tablet (5 mg total) by mouth every 6 (six) hours as needed for breakthrough pain. 04/15/22   Pickenpack-Cousar, Carlena Sax, NP  pantoprazole (PROTONIX) 40 MG tablet Take 1 tablet by mouth once daily Patient not taking: Reported on 01/13/2022 01/05/22   Tyler Pita, MD      Allergies    Patient has no known allergies.    Review of Systems   Review of Systems  Constitutional:  Positive for fatigue. Negative for chills, diaphoresis and fever.  HENT:  Negative for congestion, rhinorrhea and sneezing.  Eyes: Negative.   Respiratory:  Positive for cough. Negative for chest tightness and shortness of breath.   Cardiovascular:  Negative for chest pain and leg swelling.  Gastrointestinal:  Negative for abdominal pain, blood in stool, diarrhea, nausea and vomiting.  Genitourinary:  Negative for difficulty urinating, flank pain, frequency and hematuria.  Musculoskeletal:  Negative for arthralgias and back pain.  Skin:  Negative for rash.  Neurological:  Positive for weakness and headaches. Negative for dizziness, speech difficulty and numbness.    Physical Exam Updated Vital Signs BP 127/76 (BP Location: Left Arm)   Pulse 87   Temp 99.2 F (37.3 C) (Oral)   Resp 18    Ht 5\' 6"  (1.676 m)   Wt 68 kg   SpO2 98%   BMI 24.20 kg/m  Physical Exam Constitutional:      Appearance: She is well-developed.  HENT:     Head: Normocephalic and atraumatic.  Eyes:     Pupils: Pupils are equal, round, and reactive to light.  Cardiovascular:     Rate and Rhythm: Normal rate and regular rhythm.     Heart sounds: Normal heart sounds.  Pulmonary:     Effort: Pulmonary effort is normal. No respiratory distress.     Breath sounds: Wheezing present. No rales.  Chest:     Chest wall: No tenderness.  Abdominal:     General: Bowel sounds are normal.     Palpations: Abdomen is soft.     Tenderness: There is no abdominal tenderness. There is no guarding or rebound.  Musculoskeletal:        General: Normal range of motion.     Cervical back: Normal range of motion and neck supple.  Lymphadenopathy:     Cervical: No cervical adenopathy.  Skin:    General: Skin is warm and dry.     Findings: No rash.  Neurological:     Mental Status: She is alert.     Comments: Patient knows that she is in the hospital but cannot tell me the name of it.  She was able to tell me the month but when asked the year, she said 68.  She has some significant weakness on the right arm and right leg as compared to the L left.  She can raise them against gravity but has 3 out of 5 strength.     ED Results / Procedures / Treatments   Labs (all labs ordered are listed, but only abnormal results are displayed) Labs Reviewed  COMPREHENSIVE METABOLIC PANEL - Abnormal; Notable for the following components:      Result Value   Potassium 2.8 (*)    Glucose, Bld 106 (*)    Calcium 7.7 (*)    Total Protein 6.1 (*)    Albumin 3.0 (*)    AST 14 (*)    All other components within normal limits  CBC WITH DIFFERENTIAL/PLATELET - Abnormal; Notable for the following components:   Platelets 445 (*)    Monocytes Absolute 1.2 (*)    Abs Immature Granulocytes 0.14 (*)    All other components within  normal limits  MAGNESIUM  URINALYSIS, ROUTINE W REFLEX MICROSCOPIC    EKG EKG Interpretation  Date/Time:  Saturday May 08 2022 16:05:47 EST Ventricular Rate:  82 PR Interval:  175 QRS Duration: 89 QT Interval:  361 QTC Calculation: 422 R Axis:   64 Text Interpretation: Sinus rhythm since last tracing no significant change Confirmed by Malvin Johns [phone]) on 05/08/2022 5:25:19 PM  Radiology CT Head Wo Contrast  Result Date: 05/08/2022 CLINICAL DATA:  Headache, neuro deficit. History of lung cancer with brain metastases. EXAM: CT HEAD WITHOUT CONTRAST TECHNIQUE: Contiguous axial images were obtained from the base of the skull through the vertex without intravenous contrast. RADIATION DOSE REDUCTION: This exam was performed according to the departmental dose-optimization program which includes automated exposure control, adjustment of the mA and/or kV according to patient size and/or use of iterative reconstruction technique. COMPARISON:  Head MRI 04/17/2022 FINDINGS: Brain: A known 2 cm cystic metastasis is again seen in the left parietal lobe. Extensive vasogenic edema throughout the left cerebral hemisphere has mildly worsened with increased rightward midline shift, now measuring 10 mm (previously 8 mm). There is partial effacement of the left lateral ventricle. There is no evidence of intracapsular trapping. Mild edema associated with a metastasis in the posterior right temporal lobe has also likely mildly increased without associated mass effect. There is an approximately 2 cm lesion in the right cerebellar hemisphere without frank enlargement compared to the prior MRI, however prominent right cerebellar edema has likely mildly increased with increased mass effect on the fourth ventricle and mild mass effect on the right dorsal pons. Minimal left cerebellar edema has not significantly changed. No acute cortical infarct, intracranial hemorrhage, or extra-axial fluid collection is evident.  Vascular: Calcified atherosclerosis at the skull base. No hyperdense vessel. Skull: No acute fracture or suspicious osseous lesion. Sinuses/Orbits: Visualized paranasal sinuses and mastoid air cells are clear. Were its are unremarkable. Other: None. IMPRESSION: 1. Mildly increased brain edema associated with known metastases, including extensive edema throughout the left cerebral hemisphere with increased rightward midline shift (now 10 mm). 2. Mildly increased right cerebellar edema with partial effacement of the fourth ventricle. No obstructive hydrocephalus. Electronically Signed   By: Logan Bores M.D.   On: 05/08/2022 17:51   DG Chest 2 View  Result Date: 05/08/2022 CLINICAL DATA:  Cough and wheezing. EXAM: CHEST - 2 VIEW COMPARISON:  04/06/2022, chest radiographs and chest CT. FINDINGS: Patchy opacity noted at the left lung base or spine Stu the airspace opacities noted on the previous day's CT. Remainder of the lungs is clear. Cardiac silhouette is normal in size. No mediastinal masses. Left hilar prominence corresponds to the mass noted on the previous day's CT. Normal right hilar contours. No pleural effusion or pneumothorax. Skeletal structures are intact. IMPRESSION: 1. Left hilar mass and patchy opacities at the left lung base, the latter finding suspected to be postobstructive pneumonitis. Overall, no change from the previous day's chest CT. Electronically Signed   By: Lajean Manes M.D.   On: 05/08/2022 17:09    Procedures Procedures    Medications Ordered in ED Medications  potassium chloride SA (KLOR-CON M) CR tablet 40 mEq (40 mEq Oral Given 05/08/22 1745)  potassium chloride 10 mEq in 100 mL IVPB (0 mEq Intravenous Stopped 05/08/22 1844)  dexamethasone (DECADRON) injection 10 mg (10 mg Intravenous Given 05/08/22 1843)    ED Course/ Medical Decision Making/ A&P                             Medical Decision Making Amount and/or Complexity of Data Reviewed Labs: ordered. Radiology:  ordered.  Risk Prescription drug management. Decision regarding hospitalization.   Patient is a 67 year old who presents with progressive right-sided weakness.  She is currently the point where she cannot ambulate due to the weakness.  CT scan shows some increased edema  around the mid metastatic disease.  This is likely etiology for her symptoms.  Her labs show a mildly low potassium at 2.8.  She was given IV and oral potassium replacement.  Other labs are nonconcerning.  Her chest x-ray shows mediastinal mass and some patchy infiltrates concerning for post obstructive pneumonitis.  This was interpreted by me and confirmed by the radiologist.  I spoke with the oncologist on-call Dr. Mickeal Skinner.  I suspect patient will need to be admitted for IV steroids and hospice consult.  Dr. Mickeal Skinner concurs with this.  He recommends possibly increasing her daily Decadron to 4 or 5 mg  daily.  Will plan admission to the hospitalist service.  There seems to be a disconnect between the patient's notes and what the patient is currently tell me.  Her recent oncology notes and palliative care notes indicate the patient has refused all systemic treatments.  In discussing this with the patient, she and her husband who is now at bedside states that she wants to have all possible treatments to make her better including systemic treatments and she does not remember refusing these.  Will need to have further discussion with oncology team and probable hospice consult.  I spoke with Dr. Rogers Blocker who will admit the pt.  Final Clinical Impression(s) / ED Diagnoses Final diagnoses:  Metastasis to brain [facility])  Cerebral edema ([place])  Right sided weakness    Rx / DC Orders ED Discharge Orders     None         Malvin Johns, MD 05/08/22 1916

## 2022-05-09 DIAGNOSIS — C3492 Malignant neoplasm of unspecified part of left bronchus or lung: Secondary | ICD-10-CM | POA: Diagnosis not present

## 2022-05-09 DIAGNOSIS — C7931 Secondary malignant neoplasm of brain: Secondary | ICD-10-CM | POA: Diagnosis not present

## 2022-05-09 DIAGNOSIS — C3490 Malignant neoplasm of unspecified part of unspecified bronchus or lung: Secondary | ICD-10-CM | POA: Diagnosis not present

## 2022-05-09 LAB — BASIC METABOLIC PANEL
Anion gap: 10 (ref 5–15)
BUN: 10 mg/dL (ref 8–23)
CO2: 20 mmol/L — ABNORMAL LOW (ref 22–32)
Calcium: 8.5 mg/dL — ABNORMAL LOW (ref 8.9–10.3)
Chloride: 106 mmol/L (ref 98–111)
Creatinine, Ser: 0.63 mg/dL (ref 0.44–1.00)
GFR, Estimated: >60 mL/min (ref >60)
Glucose, Bld: 177 mg/dL — ABNORMAL HIGH (ref 70–99)
Potassium: 5 mmol/L (ref 3.5–5.1)
Sodium: 136 mmol/L (ref 135–145)

## 2022-05-09 LAB — HIV ANTIBODY (ROUTINE TESTING W REFLEX): HIV Screen 4th Generation wRfx: NONREACTIVE

## 2022-05-09 MED ORDER — BISACODYL 10 MG RE SUPP
10.0000 mg | Freq: Once | RECTAL | Status: AC
  Administered 2022-05-09: 10 mg via RECTAL
  Filled 2022-05-09: qty 1

## 2022-05-09 MED ORDER — POLYETHYLENE GLYCOL 3350 17 G PO PACK
17.0000 g | PACK | Freq: Every day | ORAL | Status: DC
  Administered 2022-05-09 – 2022-05-12 (×2): 17 g via ORAL
  Filled 2022-05-09 (×3): qty 1

## 2022-05-09 MED ORDER — SENNA 8.6 MG PO TABS
1.0000 | ORAL_TABLET | Freq: Two times a day (BID) | ORAL | Status: DC
  Administered 2022-05-09 – 2022-05-12 (×6): 8.6 mg via ORAL
  Filled 2022-05-09 (×7): qty 1

## 2022-05-09 NOTE — Evaluation (Signed)
Physical Therapy Evaluation Patient Details Name: Sandra Horne MRN: [id number] DOB: September 17, 1955 Today's Date: 05/09/2022  History of Present Illness  67 year old female who presented from home with weakness, poor oral intake. Recent MRI of the brain showed 11 new metastatic lesions and was recommended hospice approach. Patient admitted for further management. Palliative care consulted for goals of care. 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  Pt currently with functional limitations due to the deficits listed below (see PT Problem List). Pt will benefit from skilled PT to increase their independence and safety with mobility to allow discharge to the venue listed below.  Pt reports residual right sided weakness however worsened with admission.  Pt was ambulating with assistive device prior to a couple weeks ago however spouse has been needing to physically assist pt leading up to admission.  Spouse reports he cannot assist pt at home in current condition, and pt and spouse would like to pursue SNF upon d/c.  Pt very agreeable to participate however fatigued quickly.  Pt was able to stand with mod assist 3 times and take a few side steps up [place].  Would recommend +2 for ambulating for safety, and pt felt unable to perform this today due to fatigue.        Recommendations for follow up therapy are one component of a multi-disciplinary discharge planning process, led by the attending physician.  Recommendations may be updated based on patient status, additional functional criteria and insurance authorization.  Follow Up Recommendations Skilled nursing-short term rehab (<3 hours/day) Can patient physically be transported by private  vehicle: No    Assistance Recommended at Discharge Intermittent Supervision/Assistance  Patient can return home with the following  A lot of help with walking and/or transfers;A lot of help with bathing/dressing/bathroom;Assistance with cooking/housework;Assist for transportation    Equipment Recommendations None recommended by PT  Recommendations for Other Services       Functional Status Assessment Patient has had a recent decline in their functional status and demonstrates the ability to make significant improvements in function in a reasonable and predictable amount of time.     Precautions / Restrictions Precautions Precautions: Fall Precaution Comments: Right-sided hemiparesis      Mobility  Bed Mobility Overal bed mobility: Needs Assistance Bed Mobility: Supine to Sit, Sit to Supine     Supine to sit: Min guard Sit to supine: Min guard   General bed mobility comments: increased time and effort however able to perform without assist    Transfers Overall transfer level: Needs assistance Equipment used: Rolling walker (2 wheels) Transfers: Sit to/from Stand Sit to Stand: Mod assist           General transfer comment: required assist to place R hand on RW and maintain grip, cues for technique and pushing down through RW upon standing, performed x3, able to lightly march in place approx 15 sec before fatigued, on last stand was able to take side steps up [facility]; fatigued quickly and felt unable to transfer to recliner    Ambulation/Gait                  Stairs            Wheelchair Mobility    Modified Rankin (Stroke Patients Only)  Balance Overall balance assessment: Needs assistance Sitting-balance support: No upper extremity supported, Feet supported Sitting balance-Leahy Scale: Fair     Standing balance support: Bilateral upper extremity supported, Reliant on assistive device for balance Standing balance-Leahy Scale: Poor                                Pertinent Vitals/Pain Pain Assessment Pain Assessment: No/denies pain    Home Living Family/patient expects to be discharged to:: Private residence Living Arrangements: Children;Spouse/significant other Available Help at Discharge: Family;Available 24 hours/day Type of Home: House Home Access: Stairs to enter Entrance Stairs-Rails: None Entrance Stairs-Number of Steps: 2   Home Layout: One level Home Equipment: Conservation officer, nature (2 wheels);BSC/3in1;Wheelchair - manual      Prior Function Prior Level of Function : History of Falls (last six months);Needs assist             Mobility Comments: was ambulating with cane or RW prior to sudden onset of weakness approx a week ago, spouse has been physically assisting her with transfers leading up to admission       Hand Dominance   Dominant Hand: Right    Extremity/Trunk Assessment   Upper Extremity Assessment Upper Extremity Assessment: Defer to OT evaluation    Lower Extremity Assessment Lower Extremity Assessment: RLE deficits/detail RLE Deficits / Details: grossly 2+/5 throughout RLE Coordination: decreased gross motor       Communication   Communication: No difficulties  Cognition Arousal/Alertness: Awake/alert Behavior During Therapy: WFL for tasks assessed/performed Overall Cognitive Status: Within Functional Limits for tasks assessed                                          General Comments      Exercises     Assessment/Plan    PT Assessment Patient needs continued PT services  PT Problem List Decreased strength;Decreased coordination;Decreased mobility;Decreased balance;Decreased activity tolerance;Decreased knowledge of use of DME       PT Treatment Interventions Gait training;DME instruction;Therapeutic exercise;Functional mobility training;Therapeutic activities;Patient/family education;Balance training;Wheelchair mobility training    PT Goals  (Current goals can be found in the Care Plan section)  Acute Rehab PT Goals PT Goal Formulation: With patient Time For Goal Achievement: 05/23/22 Potential to Achieve Goals: Good    Frequency Min 2X/week     Co-evaluation               AM-PAC PT "6 Clicks" Mobility  Outcome Measure Help needed turning from your back to your side while in a flat bed without using bedrails?: A Little Help needed moving from lying on your back to sitting on the side of a flat bed without using bedrails?: A Little Help needed moving to and from a bed to a chair (including a wheelchair)?: A Lot Help needed standing up from a chair using your arms (e.g., wheelchair or bedside chair)?: A Lot Help needed to walk in hospital room?: A Lot Help needed climbing 3-5 steps with a railing? : Total 6 Click Score: 13    End of Session Equipment Utilized During Treatment: Gait belt Activity Tolerance: Patient tolerated treatment well Patient left: in bed;with call bell/phone within reach;with bed alarm set;with family/visitor present Nurse Communication: Mobility status      Time: 1455-1515 PT Time Calculation (min) (ACUTE ONLY): 20 min   Charges:  PT Evaluation $PT Eval Moderate Complexity: 1 Mod         Kati PT, DPT Physical Therapist Acute Rehabilitation Services Preferred contact method: Secure Chat Weekend Pager Only: [phone] Office: [email or web address] 05/09/2022, 4:32 PM

## 2022-05-09 NOTE — Consult Note (Addendum)
Consultation Note Date: 05/09/2022   Patient Name: Sandra Horne  DOB: Aug 23, 1955  MRN: [id number]  Age / Sex: 67 y.o., female  PCP: Pcp, No Referring Physician: Shelly Coss, MD  Reason for Consultation: Establishing goals of care  HPI/Patient Profile: 67 y.o. 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 Cousar, NP at [place]. In the past, the patient has moderately declined immunotherapy and symptom management was being done. Patient admitted to the hospital, wants to continue radiation, palliative care consulted. Is awake alert resting in bed.  Husband present at bedside Palliative medicine is specialized medical care for people living with serious illness. It focuses on providing relief from the symptoms and stress of a serious illness. The goal is to improve quality of life for both the patient and the family. Goals of care: Broad aims of medical therapy in relation to the patient's values and preferences. Our aim is to provide medical care aimed at enabling patients to achieve the goals that matter most to them, given the circumstances of their particular medical situation and their constraints.   " I do want treatment.  I want to talk to Dr. Earlie Server."  NEXT OF KIN  Has adult children, was living with daughter in [place], [place]. Prior to that, was living with husband in [place], [place].   Discussion/SUMMARY OF  RECOMMENDATIONS   Patient elects for full code full scope status Patient wishes to continue radiation Patient wishes to discuss with medical oncology further about what options exist regarding treatment of her serious illness Will recommend continuation of follow-up with outpatient palliative services Pain and known pain symptom management options discussed with patient.  Continue bowel regimen.  Code Status/Advance Care Planning: Full code   Symptom Management:     Palliative Prophylaxis:  Frequent Pain Assessment  Additional Recommendations (Limitations, Scope, Preferences): Full Scope Treatment  Psycho-social/Spiritual:  Desire for further Chaplaincy support:yes Additional Recommendations: Caregiving  Support/Resources  Prognosis:  Unable to determine  Discharge Planning: To Be Determined      Primary Diagnoses: Present on Admission:  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 [facility])  Tobacco abuse  Lung cancer metastatic to brain [facility])   I have reviewed the medical record, interviewed the patient and family, and examined the patient. The following aspects are pertinent.  Past Medical History:  Diagnosis Date   Cancer ([place])    Lung and Brain mets   Social History   Socioeconomic History   Marital status: Married    Spouse name: Not on file   Number of children: Not on file   Years  of education: Not on file   Highest education level: Not on file  Occupational History   Not on file  Tobacco Use   Smoking status: Some Days    Packs/day: 0.25    Years: 40.00    Total pack years: 10.00    Types: Cigarettes   Smokeless tobacco: Never  Vaping Use   Vaping Use: Never used  Substance and Sexual Activity   Alcohol use: Not Currently    Comment: drinks beer rarely    Drug use: Not Currently    Types: Marijuana    Comment: last marijuana use mid 11/19/2019   Sexual activity: Yes    Birth control/protection: None  Other Topics  Concern   Not on file  Social History Narrative   Not on file   Social Determinants of Health   Financial Resource Strain: Low Risk  (01/14/2020)   Overall Financial Resource Strain (CARDIA)    Difficulty of Paying Living Expenses: Not hard at all  Food Insecurity: No Food Insecurity (05/08/2022)   Hunger Vital Sign    Worried About Running Out of Food in the Last Year: Never true    Ran Out of Food in the Last Year: Never true  Transportation Needs: Unmet Transportation Needs (05/08/2022)   PRAPARE - Transportation    Lack of Transportation (Medical): Yes    Lack of Transportation (Non-Medical): Yes  Physical Activity: Inactive (01/14/2020)   Exercise Vital Sign    Days of Exercise per Week: 0 days    Minutes of Exercise per Session: 0 min  Stress: No Stress Concern Present (01/14/2020)   [place]    Feeling of Stress : Not at all  Social Connections: Socially Isolated (01/14/2020)   Social Connection and Isolation Panel [NHANES]    Frequency of Communication with Friends and Family: More than three times a week    Frequency of Social Gatherings with Friends and Family: Never    Attends Religious Services: Never    Marine scientist or Organizations: No    Attends Archivist Meetings: Never    Marital Status: Widowed   Family History  Problem Relation Age of Onset   Hypertension Mother    Stroke Mother    Hypertension Father    Cancer Sister    Cancer Paternal Uncle    Scheduled Meds:  dexamethasone  5 mg Oral Daily   enoxaparin (LOVENOX) injection  40 mg Subcutaneous Q24H   gabapentin  300 mg Oral TID   methocarbamol  500 mg Oral TID   nicotine  14 mg Transdermal Daily   polyethylene glycol  17 g Oral Daily   senna  1 tablet Oral BID   Continuous Infusions: PRN Meds:.acetaminophen **OR** acetaminophen, ondansetron **OR** ondansetron (ZOFRAN) IV, oxyCODONE Medications Prior to  Admission:  Prior to Admission medications   Medication Sig Start Date End Date Taking? Authorizing Provider  aspirin EC 81 MG tablet Take 81 mg by mouth daily. Swallow whole.   Yes [provider]  dexamethasone (DECADRON) 2 MG tablet Take 1 tablet (2 mg total) by mouth daily. 03/04/22  Yes Pickenpack-Cousar, Carlena Sax, NP  gabapentin (NEURONTIN) 300 MG capsule Take 1 capsule (300 mg total) by mouth 3 (three) times daily. Patient taking differently: Take 300 mg by mouth 3 (three) times daily as needed (for pain). 01/13/22  Yes Carole Civil, MD  ibuprofen (ADVIL) 800 MG tablet Take 1 tablet (800 mg total) by mouth every  8 (eight) hours as needed. Patient taking differently: Take 800 mg by mouth every 8 (eight) hours as needed for mild pain or headache. 01/13/22  Yes Carole Civil, MD  methocarbamol (ROBAXIN) 500 MG tablet Take 1 tablet (500 mg total) by mouth 3 (three) times daily. Patient taking differently: Take 500 mg by mouth every 8 (eight) hours as needed for muscle spasms. 01/13/22  Yes Carole Civil, MD  Nicotine (NICODERM CQ TD) Place 1 patch onto the skin daily as needed (for smoking cessation while hospitalized).   Yes [provider]  oxyCODONE (ROXICODONE) 5 MG immediate release tablet Take 1 tablet (5 mg total) by mouth every 6 (six) hours as needed for breakthrough pain. Patient taking differently: Take 5 mg by mouth every 4 (four) hours as needed (for pain). 04/15/22  Yes Pickenpack-Cousar, Carlena Sax, NP  levETIRAcetam (KEPPRA) 500 MG tablet Take 1 tablet (500 mg total) by mouth 2 (two) times daily. Patient not taking: Reported on 05/08/2022 11/03/21 05/08/22  Barb Merino, MD  lidocaine (LIDODERM) 5 % Place 1 patch onto the skin daily. Remove & Discard patch within 12 hours or as directed by MD Patient not taking: Reported on 05/08/2022 04/06/22   Kommor, Debe Coder, MD  naproxen (NAPROSYN) 375 MG tablet Take 1 tablet (375 mg total) by mouth 2 (two) times  daily. Patient not taking: Reported on 05/08/2022 04/06/22   Kommor, Debe Coder, MD  pantoprazole (PROTONIX) 40 MG tablet Take 1 tablet by mouth once daily Patient not taking: Reported on 05/08/2022 01/05/22   Tyler Pita, MD   No Known Allergies Review of Systems weakness Physical Exam Complains of chronic pain and weakness in R leg Awake alert No distress Abdomen not distended No edema Denies fecal/urinary incontinence, denies new back pain.   Vital Signs: BP 121/80 (BP Location: Right Arm)   Pulse 80   Temp 97.9 F (36.6 C) (Oral)   Resp 18   Ht 5\' 6"  (1.676 m)   Wt 68 kg   SpO2 98%   BMI 24.20 kg/m  Pain Scale: 0-10   Pain Score: Asleep   SpO2: SpO2: 98 % O2 Device:SpO2: 98 % O2 Flow Rate: .   IO: Intake/output summary:  Intake/Output Summary (Last 24 hours) at 05/09/2022 1228 Last data filed at 05/09/2022 1138 Gross per 24 hour  Intake 823.72 ml  Output 450 ml  Net 373.72 ml    LBM: Last BM Date : 05/05/22 Baseline Weight: Weight: 68 kg Most recent weight: Weight: 68 kg     Palliative Assessment/Data:   PPS 50%  Time In:  11.30 Time Out:  1230 Time Total:  60  Greater than 50%  of this time was spent counseling and coordinating care related to the above assessment and plan.  Signed by: Loistine Chance, MD   Please contact Palliative Medicine Team phone at [phone] for questions and concerns.  For individual provider: See Shea Evans

## 2022-05-09 NOTE — TOC Initial Note (Addendum)
Transition of Care [facility]) - Initial/Assessment Note    Patient Details  Name: Sandra Horne MRN: [id number] Date of Birth: [birth year]  Transition of Care [facility]) CM/SW Contact:    Henrietta Dine, RN Phone Number: 05/09/2022, 1:35 PM  Clinical Narrative:                 SDOH risk; spoke w/ pt and husband in room; pt identified [place] [phone]); pt says she is from home and she plans to return at d/c; she denies IPV, difficulty paying utilities, and food insecurity; they say she has transportation; pt says she does not have glasses, HA, or dentures; pt says she has walker, cane, wheelchair, shower chair, bars in shower, and BSC; pt says she does not have [place] services or home oxygen; no PCP listed; pt says she has PCP on [employer], but she is not sure of practice name; she thinks provider's name is "Inez Catalina"; pt's husband also does not know the provider's name or practice; awaiting PT eval; TOC will follow.  [id number]- pt's sister-in-law says MD's name is Dr Tula Nakayama. Expected Discharge Plan: Home/Self Care Barriers to Discharge: Continued Medical Work up   Patient Goals and CMS Choice Patient states their goals for this hospitalization and ongoing recovery are:: home          Expected Discharge Plan and Services   Discharge Planning Services: CM Consult   Living arrangements for the past 2 months: Single Family Home                                      Prior Living Arrangements/Services Living arrangements for the past 2 months: Single Family Home Lives with:: Self Patient language and need for interpreter reviewed:: Yes Do you feel safe going back to the place where you live?: Yes      Need for Family Participation in Patient Care: Yes (Comment) Care giver support system in place?: Yes (comment) Current home services: DME (cane. walker, wheel chair, shower chair, BSC) Criminal Activity/Legal Involvement Pertinent to Current  Situation/Hospitalization: No - Comment as needed  Activities of Daily Living Home Assistive Devices/Equipment: Walker (specify type) ADL Screening (condition at time of admission) Patient's cognitive ability adequate to safely complete daily activities?: No Is the patient deaf or have difficulty hearing?: No Does the patient have difficulty seeing, even when wearing glasses/contacts?: No Does the patient have difficulty concentrating, remembering, or making decisions?: Yes Patient able to express need for assistance with ADLs?: Yes Does the patient have difficulty dressing or bathing?: Yes Independently performs ADLs?: No Communication: Independent Dressing (OT): Needs assistance Is this a change from baseline?: Pre-admission baseline Grooming: Needs assistance Is this a change from baseline?: Pre-admission baseline Feeding: Independent Bathing: Needs assistance Is this a change from baseline?: Pre-admission baseline Toileting: Needs assistance Is this a change from baseline?: Pre-admission baseline In/Out Bed: Needs assistance Is this a change from baseline?: Pre-admission baseline Walks in Home: Needs assistance Is this a change from baseline?: Pre-admission baseline Does the patient have difficulty walking or climbing stairs?: Yes Weakness of Legs: Both (greater in R) Weakness of Arms/Hands: Both (greater in R)  Permission Sought/Granted Permission sought to share information with : Case Manager    Share Information with NAME: Lenor Coffin, RN, CM     Permission granted to share info w Relationship: [name] (spouse) [phone]     Emotional Assessment  Appearance:: Appears stated age Attitude/Demeanor/Rapport: Gracious Affect (typically observed): Accepting Orientation: : Oriented to Self, Oriented to Place, Oriented to  Time, Oriented to Situation Alcohol / Substance Use: Not Applicable Psych Involvement: No (comment)  Admission diagnosis:  Cerebral edema  (HCC) [G93.6] Metastasis to brain [facility]) [C79.31] Right sided weakness [R53.1] Lung cancer metastatic to brain ([place]) [C34.90, C79.31] Patient Active Problem List   Diagnosis Date Noted   Hypokalemia 05/08/2022   Lung cancer metastatic to brain ([place]) 05/08/2022   Thrombocytopenia ([place]) 05/08/2022   Malnutrition of moderate degree 11/02/2021   Rt Sided Weakness due to Vasogenic Brain edema 10/31/2021   Tobacco abuse 10/31/2021   Closed displaced fracture of right femoral neck ([place]) 10/12/2021   Malignant neoplasm metastatic to brain ([place]) 03/27/2021   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 ([place]) 03/26/2021   Acute encephalopathy 03/25/2021   Primary cancer of left lower lobe of lung ([place]) 01/14/2020   CLOSED FRACTURE OF UNSPECIFIED PART OF HUMERUS 04/01/2010   PCP:  Pcp, No Pharmacy:   [facility] [place] - [address] [place] [address] Phone: [phone] Fax: [phone]     Social Determinants of Health (SDOH) Social History: SDOH Screenings   Food Insecurity: No Food Insecurity (05/09/2022)  Housing: Low Risk  (05/08/2022)  Transportation Needs: No Transportation Needs (05/09/2022)  Recent Concern: Transportation Needs - Unmet Transportation Needs (05/08/2022)  Utilities: Not At Risk (05/08/2022)  Financial Resource Strain: Low Risk  (01/14/2020)  Physical Activity: Inactive (01/14/2020)  Social Connections: Socially Isolated (01/14/2020)  Stress: No Stress Concern Present (01/14/2020)  Tobacco Use: High Risk (05/08/2022)   SDOH Interventions: Food Insecurity Interventions: Inpatient TOC Housing Interventions: Inpatient TOC Transportation Interventions: Inpatient TOC   Readmission Risk Interventions    10/14/2021   10:00 AM  Readmission Risk Prevention Plan  Transportation Screening Complete  PCP or Specialist Appt within 5-7 Days Not Complete  Home Care Screening Complete  Medication  Review (RN CM) Complete

## 2022-05-09 NOTE — Progress Notes (Signed)
PROGRESS NOTE  Sandra WIESEN  QIW:[id number] DOB: 05-13-1955 DOA: 05/08/2022 PCP: Pcp, No   Brief Narrative: Patient is a 67 year old female with history of non-small cell lung cancer with mets to brain with mets to brain resulting in right-sided weakness, currently in radiation treatment, recent history of right femoral neck fracture status post right hemiarthroplasty on 7/23, tobacco use who presented from home with weakness, poor oral intake.  Recent MRI of the brain showed 11 new metastatic lesions and was recommended hospice approach.  Patient admitted for further management.  Palliative care consulted for goals of care.  Assessment & Plan:  Principal Problem:   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 [facility]) Active Problems:   Hypokalemia   Tobacco abuse   Thrombocytopenia (HCC)   Lung cancer metastatic to brain ([place])   Non-small cell lung cancer with mets to brain: Has residual right-sided weakness from brain mets.  Presented with increased weakness from baseline.  Patient MRI showed likely new metastatic lesions, was on radiation treatment but now has been recommended hospice approach.  But patient wants to continue radiation treatment.  CT head showed mildly increased brain edema with increased rightward midline shift and mild increased right cerebral edema.  Continue Decadron. She recently completed 3/10 radiation treatments. Oncology, radiation oncology consulted/notified. 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  Remains on room air.  Hypokalemia: Continue monitoring and supplemented as needed  Tobacco use: Counseled cessation.  Ordered nicotine patch  Left-sided weakness: Will consult PT/OT  Constipation: Continue aggressive bowel regimen  Goals of care: Patient with metastatic lung cancer.  Recent MRI with evidence of increased metastatic lesions.   Very poor prognosis.  Palliative care consulted for goals of care.  Currently full code.  As per conversation today at bedside, she wants full to continue radiation treatment.         DVT prophylaxis:enoxaparin (LOVENOX) injection 40 mg Start: 05/08/22 2200     Code Status: Full Code  Family Communication: None at bedside  Patient status:Inpatient  Patient is from :Home  Anticipated discharge ER:[facility]  Estimated DC date: 1 to 2 days   Consultants: Palliative care  Procedures: None  Antimicrobials:  Anti-infectives (From admission, onward)    None       Subjective: Patient seen and examined at bedside today.  Hemodynamically stable.  Comfortable, on room air.  Denies any shortness of breath or cough.  She thinks her right sided weakness has improved.  Remains alert and oriented  Objective: Vitals:   05/08/22 1925 05/08/22 2014 05/09/22 0054 05/09/22 0527  BP: 131/77 114/76 103/63 125/86  Pulse: 75 85 73 77  Resp: 17 18 15 15   Temp: 98.6 F (37 C) 98.4 F (36.9 C) 98.3 F (36.8 C) 98.5 F (36.9 C)  TempSrc: Oral Oral Oral Oral  SpO2: 98% 96% 94% 97%  Weight:      Height:        Intake/Output Summary (Last 24 hours) at 05/09/2022 0810 Last data filed at 05/09/2022 0536 Gross per 24 hour  Intake 583.72 ml  Output 250 ml  Net 333.72 ml   Filed Weights   05/08/22 1601  Weight: 68 kg    Examination:  General exam: Overall comfortable, not in distress, pleasant female HEENT: PERRL Respiratory system:  no wheezes or crackles  Cardiovascular system: S1 & S2 heard, RRR.  Gastrointestinal system: Abdomen  is nondistended, soft and nontender. Central nervous system: Alert and oriented, residual weakness on the right side Extremities: No edema, no clubbing ,no cyanosis Skin: No rashes, no ulcers,no icterus     Data Reviewed: I have personally reviewed following labs and imaging studies  CBC: Recent Labs  Lab 05/08/22 1643  WBC 10.0  NEUTROABS 5.5   HGB 12.3  HCT 39.6  MCV 91.0  PLT 831*   Basic Metabolic Panel: Recent Labs  Lab 05/08/22 1643 05/08/22 1733 05/09/22 0021  NA 137  --  136  K 2.8*  --  5.0  CL 108  --  106  CO2 24  --  20*  GLUCOSE 106*  --  177*  BUN 11  --  10  CREATININE 0.68  --  0.63  CALCIUM 7.7*  --  8.5*  MG  --  1.8  --      No results found for this or any previous visit (from the past 240 hour(s)).   Radiology Studies: CT Head Wo Contrast  Result Date: 05/08/2022 CLINICAL DATA:  Headache, neuro deficit. History of lung cancer with brain metastases. EXAM: CT HEAD WITHOUT CONTRAST TECHNIQUE: Contiguous axial images were obtained from the base of the skull through the vertex without intravenous contrast. RADIATION DOSE REDUCTION: This exam was performed according to the departmental dose-optimization program which includes automated exposure control, adjustment of the mA and/or kV according to patient size and/or use of iterative reconstruction technique. COMPARISON:  Head MRI 04/17/2022 FINDINGS: Brain: A known 2 cm cystic metastasis is again seen in the left parietal lobe. Extensive vasogenic edema throughout the left cerebral hemisphere has mildly worsened with increased rightward midline shift, now measuring 10 mm (previously 8 mm). There is partial effacement of the left lateral ventricle. There is no evidence of intracapsular trapping. Mild edema associated with a metastasis in the posterior right temporal lobe has also likely mildly increased without associated mass effect. There is an approximately 2 cm lesion in the right cerebellar hemisphere without frank enlargement compared to the prior MRI, however prominent right cerebellar edema has likely mildly increased with increased mass effect on the fourth ventricle and mild mass effect on the right dorsal pons. Minimal left cerebellar edema has not significantly changed. No acute cortical infarct, intracranial hemorrhage, or extra-axial fluid collection  is evident. Vascular: Calcified atherosclerosis at the skull base. No hyperdense vessel. Skull: No acute fracture or suspicious osseous lesion. Sinuses/Orbits: Visualized paranasal sinuses and mastoid air cells are clear. Were its are unremarkable. Other: None. IMPRESSION: 1. Mildly increased brain edema associated with known metastases, including extensive edema throughout the left cerebral hemisphere with increased rightward midline shift (now 10 mm). 2. Mildly increased right cerebellar edema with partial effacement of the fourth ventricle. No obstructive hydrocephalus. Electronically Signed   By: Logan Bores M.D.   On: 05/08/2022 17:51   DG Chest 2 View  Result Date: 05/08/2022 CLINICAL DATA:  Cough and wheezing. EXAM: CHEST - 2 VIEW COMPARISON:  04/06/2022, chest radiographs and chest CT. FINDINGS: Patchy opacity noted at the left lung base or spine Stu the airspace opacities noted on the previous day's CT. Remainder of the lungs is clear. Cardiac silhouette is normal in size. No mediastinal masses. Left hilar prominence corresponds to the mass noted on the previous day's CT. Normal right hilar contours. No pleural effusion or pneumothorax. Skeletal structures are intact. IMPRESSION: 1. Left hilar mass and patchy opacities at the left lung base, the latter finding suspected to be  postobstructive pneumonitis. Overall, no change from the previous day's chest CT. Electronically Signed   By: Lajean Manes M.D.   On: 05/08/2022 17:09    Scheduled Meds:  dexamethasone  5 mg Oral Daily   enoxaparin (LOVENOX) injection  40 mg Subcutaneous Q24H   gabapentin  300 mg Oral TID   methocarbamol  500 mg Oral TID   nicotine  14 mg Transdermal Daily   Continuous Infusions:  sodium chloride 75 mL/hr at 05/09/22 0440     LOS: 0 days   Shelly Coss, MD [facility] P2/01/2023, 8:10 AM

## 2022-05-10 ENCOUNTER — Ambulatory Visit
Admission: RE | Admit: 2022-05-10 | Discharge: 2022-05-10 | Disposition: A | Discharge status: Home / Self Care | Payer: 59 | Source: Ambulatory Visit | Attending: Radiation Oncology | Admitting: Radiation Oncology

## 2022-05-10 ENCOUNTER — Ambulatory Visit: Payer: 59

## 2022-05-10 ENCOUNTER — Inpatient Hospital Stay: Payer: 59 | Admitting: Nurse Practitioner

## 2022-05-10 ENCOUNTER — Other Ambulatory Visit: Payer: Self-pay

## 2022-05-10 DIAGNOSIS — C3492 Malignant neoplasm of unspecified part of left bronchus or lung: Secondary | ICD-10-CM | POA: Diagnosis not present

## 2022-05-10 DIAGNOSIS — Z510 Encounter for antineoplastic radiation therapy: Secondary | ICD-10-CM | POA: Diagnosis not present

## 2022-05-10 DIAGNOSIS — C3490 Malignant neoplasm of unspecified part of unspecified bronchus or lung: Secondary | ICD-10-CM | POA: Diagnosis not present

## 2022-05-10 DIAGNOSIS — C7931 Secondary malignant neoplasm of brain: Secondary | ICD-10-CM | POA: Diagnosis not present

## 2022-05-10 LAB — RAD ONC ARIA SESSION SUMMARY
Course Elapsed Days: 7
Plan Fractions Treated to Date: 4
Plan Prescribed Dose Per Fraction: 3 Gy
Plan Total Fractions Prescribed: 10
Plan Total Prescribed Dose: 30 Gy
Reference Point Dosage Given to Date: 12 Gy
Reference Point Session Dosage Given: 3 Gy
Session Number: 4

## 2022-05-10 LAB — GLUCOSE, CAPILLARY: Glucose-Capillary: 133 mg/dL — ABNORMAL HIGH (ref 70–99)

## 2022-05-10 NOTE — Evaluation (Signed)
Occupational Therapy Evaluation Patient Details Name: Sandra Horne MRN: [id number] DOB: [birth year] Today's Date: 05/10/2022   History of Present Illness Patient is a 67 year old female who presented to the hospital with poor oral intake and weakness. Patient imaging revealed 11 new metastatic lesions. pallative has been consulted. PMH: non-small cell lung cancer with mets to brain resulting in R side weakness, radiation treatemnt, R femoral neck fx s/p right hemiarthroplasty 7/23, tobacco use.   Clinical Impression   Patient is a 67 year old female who was admitted for above. Patient was living at home with husband support PRN prior level per patient report. Patient was able to transfer with min A with RW with cues for weight shifting, max A for LB dressing and min A for grooming tasks.patient endorsed having R side weakness worsen since admission. Per PT note, patients spouse is unable to offer the physical assist patient needs at this time.  Patient was noted to have decreased functional activity tolerance, decreased endurance, decreased standing balance, decreased safety awareness, and decreased knowledge of AD/AE impacting participation in ADLs. Patient would continue to benefit from skilled OT services at this time while admitted and after d/c to address noted deficits in order to improve overall safety and independence in ADLs.        Recommendations for follow up therapy are one component of a multi-disciplinary discharge planning process, led by the attending physician.  Recommendations may be updated based on patient status, additional functional criteria and insurance authorization.   Follow Up Recommendations  Skilled nursing-short term rehab (<3 hours/day)     Assistance Recommended at Discharge Frequent or constant Supervision/Assistance  Patient can return home with the following A lot of help with walking and/or transfers;A lot of help with bathing/dressing/bathroom;Assistance  with cooking/housework;Direct supervision/assist for medications management;Assist for transportation;Direct supervision/assist for financial management;Help with stairs or ramp for entrance    Functional Status Assessment  Patient has had a recent decline in their functional status and demonstrates the ability to make significant improvements in function in a reasonable and predictable amount of time.  Equipment Recommendations          Precautions / Restrictions Precautions Precautions: Fall Precaution Comments: Right-sided hemiparesis Restrictions Weight Bearing Restrictions: No      Mobility Bed Mobility Overal bed mobility: Needs Assistance Bed Mobility: Supine to Sit     Supine to sit: Min assist     General bed mobility comments: to advance BLE to EOB       Balance Overall balance assessment: Needs assistance Sitting-balance support: No upper extremity supported, Feet supported Sitting balance-Leahy Scale: Fair     Standing balance support: Bilateral upper extremity supported, Reliant on assistive device for balance Standing balance-Leahy Scale: Poor           ADL either performed or assessed with clinical judgement   ADL Overall ADL's : Needs assistance/impaired Eating/Feeding: Set up;Sitting   Grooming: Sitting;Minimal assistance   Upper Body Bathing: Sitting;Moderate assistance   Lower Body Bathing: Bed level;Maximal assistance   Upper Body Dressing : Moderate assistance;Bed level   Lower Body Dressing: Maximal assistance;Bed level   Toilet Transfer: Minimal assistance;Ambulation;Rolling walker (2 wheels) Toilet Transfer Details (indicate cue type and reason): to transfer with RW from edge of bed to recliner in room with cues for advancement of BLE to chair Toileting- Clothing Manipulation and Hygiene: Maximal assistance;Sit to/from stand                Pertinent Vitals/Pain Pain  Assessment Pain Assessment: No/denies pain     Hand  Dominance Right   Extremity/Trunk Assessment Upper Extremity Assessment Upper Extremity Assessment: RUE deficits/detail RUE Deficits / Details: R side hemi with h/o Hemiarthroplasty. patient reported that she had surgery on the right side but unable to say what or when. patient is able to AROM FF to about 60 degrees. patient was able to AROM elbow about 15 degrees but tolerat AAROM within full range to neutral and full flexion to upper arm. hand was able to make fist and extend digits to neutral.patient has clubbed fingers bilaterally.   Lower Extremity Assessment Lower Extremity Assessment: Defer to PT evaluation       Communication Communication Communication: No difficulties   Cognition Arousal/Alertness: Awake/alert Behavior During Therapy: WFL for tasks assessed/performed Overall Cognitive Status: Within Functional Limits for tasks assessed         General Comments: patient reported 02/28/23 for the date patient indicated that day was 12. had some confusion during session.                Home Living Family/patient expects to be discharged to:: Private residence Living Arrangements: Children;Spouse/significant other Available Help at Discharge: Family;Available PRN/intermittently (husband works during the day per patient report) Type of Home: House Home Access: Stairs to enter [employer] of Steps: 6 Entrance Stairs-Rails: None Home Layout: One level     Bathroom Shower/Tub: Occupational psychologist: Standard     Home Equipment: Conservation officer, nature (2 wheels);BSC/3in1;Wheelchair - manual          Prior Functioning/Environment Prior Level of Function : Independent/Modified Independent             Mobility Comments: patient used RW for mobility. ADLs Comments: husband helps with IADLs per patient report.        OT Problem List: Decreased activity tolerance;Impaired balance (sitting and/or standing);Decreased coordination;Decreased  knowledge of precautions;Decreased safety awareness;Decreased knowledge of use of DME or AE      OT Treatment/Interventions: Self-care/ADL training;Energy conservation;Therapeutic exercise;DME and/or AE instruction;Therapeutic activities;Patient/family education;Balance training    OT Goals(Current goals can be found in the care plan section) Acute Rehab OT Goals Patient Stated Goal: to get better OT Goal Formulation: With patient Time For Goal Achievement: 05/24/22 Potential to Achieve Goals: Fair  OT Frequency: Min 2X/week       AM-PAC OT "6 Clicks" Daily Activity     Outcome Measure Help from another person eating meals?: A Little Help from another person taking care of personal grooming?: A Little Help from another person toileting, which includes using toliet, bedpan, or urinal?: A Lot Help from another person bathing (including washing, rinsing, drying)?: A Lot Help from another person to put on and taking off regular upper body clothing?: A Lot Help from another person to put on and taking off regular lower body clothing?: A Lot 6 Click Score: 14   End of Session Equipment Utilized During Treatment: Gait belt;Rolling walker (2 wheels) Nurse Communication: Mobility status  Activity Tolerance: Patient tolerated treatment well Patient left: in chair;with call bell/phone within reach;with chair alarm set  OT Visit Diagnosis: Unsteadiness on feet (R26.81);Other abnormalities of gait and mobility (R26.89);Muscle weakness (generalized) (M62.81)                Time: 1030-1057 OT Time Calculation (min): 27 min Charges:  OT General Charges $OT Visit: 1 Visit OT Evaluation $OT Eval Moderate Complexity: 1 Mod OT Treatments $Self Care/Home Management : 8-22 mins  Nalany Steedley OTR/L,  MS Acute Rehabilitation Department Office# [phone]   Willa Rough 05/10/2022, 2:10 PM

## 2022-05-10 NOTE — Progress Notes (Signed)
Daily Progress Note   Patient Name: Sandra Horne       Date: 05/10/2022 DOB: 1956-03-08  Age: 67 y.o. MRN#: [id number] Attending Physician: Shelly Coss, MD Primary Care Physician: Pcp, No Admit Date: 05/08/2022  Reason for Consultation/Follow-up: Establishing goals of care  Subjective: Awake alert, sitting in chair, awaiting radiation appointment this afternoon. Had a bowel movement.   Length of Stay: 0  Current Medications: Scheduled Meds:  . dexamethasone  5 mg Oral Daily  . enoxaparin (LOVENOX) injection  40 mg Subcutaneous Q24H  . gabapentin  300 mg Oral TID  . methocarbamol  500 mg Oral TID  . nicotine  14 mg Transdermal Daily  . polyethylene glycol  17 g Oral Daily  . senna  1 tablet Oral BID    Continuous Infusions:   PRN Meds: acetaminophen **OR** acetaminophen, ondansetron **OR** ondansetron (ZOFRAN) IV, oxyCODONE  Physical Exam         Chronic R sided weakness Regular work of breathing Awake alert Abdomen not distended  Vital Signs: BP 139/82 (BP Location: Right Arm)   Pulse 67   Temp 97.8 F (36.6 C) (Oral)   Resp 15   Ht 5\' 6"  (1.676 m)   Wt 68 kg   SpO2 100%   BMI 24.20 kg/m  SpO2: SpO2: 100 % O2 Device: O2 Device: Room Air O2 Flow Rate:    Intake/output summary:  Intake/Output Summary (Last 24 hours) at 05/10/2022 1222 Last data filed at 05/10/2022 0453 Gross per 24 hour  Intake 240 ml  Output 700 ml  Net -460 ml   LBM: Last BM Date : 05/09/22 Baseline Weight: Weight: 68 kg Most recent weight: Weight: 68 kg       Palliative Assessment/Data:      Patient Active Problem List   Diagnosis Date Noted  . Hypokalemia 05/08/2022  . Lung cancer metastatic to brain ([place]) 05/08/2022  . Thrombocytopenia ([place]) 05/08/2022  .  Malnutrition of moderate degree 11/02/2021  . Rt Sided Weakness due to Vasogenic Brain edema 10/31/2021  . Tobacco abuse 10/31/2021  . Closed displaced fracture of right femoral neck ([place]) 10/12/2021  . Malignant neoplasm metastatic to brain ([place]) 03/27/2021  . 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 ([place]) 03/26/2021  . Acute encephalopathy 03/25/2021  . Primary cancer of  left lower lobe of lung ([place]) 01/14/2020  . CLOSED FRACTURE OF UNSPECIFIED PART OF HUMERUS 04/01/2010    Palliative Care Assessment & Plan   Patient Profile:    Assessment:  67 year old with history of non-small cell lung cancer with metastatic burden to the brain resulting in right-sided weakness currently on radiation treatments, recent history of right femoral neck fracture status post right hemiarthroplasty, history of tobacco use Patient presented with weakness poor oral intake Recent MRI of the brain showing 11 new metastatic lesions Known to palliative medicine service-is seen by palliative colleague Ms. Cousar, NP at [facility]. In the past, the patient has   declined immunotherapy and symptom management was being done.  Recommendations/Plan: Full code, full scope Continuing radiation.   SNF rehab with palliative on discharge. To continue follow up with palliative care at [facility].   Goals of Care and Additional Recommendations: Limitations on Scope of Treatment: Full Scope Treatment  Code Status:    Code Status Orders  (From admission, onward)           Start     Ordered   05/08/22 2020  Full code  Continuous       Question:  By:  Answer:  Consent: discussion documented in EHR   05/08/22 2020           Code Status History     Date Active Date Inactive Code Status Order ID Comments User Context   10/31/2021 1711 11/03/2021 2150 Full Code [id number]  Roxan Hockey, MD ED   10/13/2021 0445 10/16/2021 1926 Full Code [id number]  Bernadette Hoit, DO  Inpatient   03/25/2021 1158 03/28/2021 1908 Full Code [id number]  Rodena Goldmann, DO ED       Prognosis:  Unable to determine  Discharge Planning: [place] for rehab with Palliative care service follow-up  Care plan was discussed with  patient  Thank you for allowing the Palliative Medicine Team to assist in the care of this patient.  Mod MDM     Greater than 50%  of this time was spent counseling and coordinating care related to the above assessment and plan.  Loistine Chance, MD  Please contact Palliative Medicine Team phone at [phone] for questions and concerns.

## 2022-05-10 NOTE — Care Management Obs Status (Signed)
[facility] NOTIFICATION   Patient Details  Name: KINZEE HAPPEL MRN: [id number] Date of Birth: 1956-03-01   Medicare Observation Status Notification Given:  Yes    Angelita Ingles, RN 05/10/2022, 12:25 PM

## 2022-05-10 NOTE — NC FL2 (Signed)
[place] MEDICAID FL2 LEVEL OF CARE FORM     IDENTIFICATION  Patient Name: Sandra Horne Birthdate: [birth year] Sex: female Admission Date (Current Location): 05/08/2022  [place] and [place] Number:  Kathleen Argue [id number] [place] and Address:  [facility],  [place], [place]      Provider Number: [id number]  Attending Physician Name and Address:  Shelly Coss, MD  Relative Name and Phone Number:  Skyylar Kopf        [phone]    Current Level of Care: Hospital Recommended Level of Care: [place] Prior Approval Number:    Date Approved/Denied:   PASRR Number: [id number]   ( Found in ncmust under Lajuana Matte)  Discharge Plan: SNF    Current Diagnoses: Patient Active Problem List   Diagnosis Date Noted   Hypokalemia 05/08/2022   Lung cancer metastatic to brain ([place]) 05/08/2022   Thrombocytopenia ([place]) 05/08/2022   Malnutrition of moderate degree 11/02/2021   Rt Sided Weakness due to Vasogenic Brain edema 10/31/2021   Tobacco abuse 10/31/2021   Closed displaced fracture of right femoral neck ([place]) 10/12/2021   Malignant neoplasm metastatic to brain [facility]) 03/27/2021   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 [facility]) 03/26/2021   Acute encephalopathy 03/25/2021   Primary cancer of left lower lobe of lung ([place]) 01/14/2020   CLOSED FRACTURE OF UNSPECIFIED PART OF HUMERUS 04/01/2010    Orientation RESPIRATION BLADDER Height & Weight     Self, Situation, Place, Time  Normal Continent Weight: 68 kg Height:  5\' 6"  (167.6 cm)  BEHAVIORAL SYMPTOMS/MOOD NEUROLOGICAL BOWEL NUTRITION STATUS     (n/a) Continent Diet  AMBULATORY STATUS COMMUNICATION OF NEEDS Skin   Extensive Assist Verbally Normal                       Personal Care Assistance Level of Assistance  Bathing, Feeding, Dressing Bathing Assistance: Limited assistance Feeding assistance: Independent Dressing Assistance:  Limited assistance     Functional Limitations Info  Sight, Hearing, Speech Sight Info: Adequate Hearing Info: Adequate Speech Info: Adequate    SPECIAL CARE FACTORS FREQUENCY  PT (By licensed PT), OT (By licensed OT)     PT Frequency: 5x/wk OT Frequency: 5x/wk            Contractures Contractures Info: Not present    Additional Factors Info  Code Status, Allergies, Psychotropic, Insulin Sliding Scale, Isolation Precautions Code Status Info: Full Allergies Info: No known allergies Psychotropic Info: see discharge summary Insulin Sliding Scale Info: see discharge summary Isolation Precautions Info: n/a     Current Medications (05/10/2022):  This is the current hospital active medication list Current Facility-Administered Medications  Medication Dose Route Frequency Provider Last Rate Last Admin   acetaminophen (TYLENOL) tablet 650 mg  650 mg Oral Q6H PRN Orma Flaming, MD       Or   acetaminophen (TYLENOL) suppository 650 mg  650 mg Rectal Q6H PRN Orma Flaming, MD       dexamethasone (DECADRON) tablet 5 mg  5 mg Oral Daily Orma Flaming, MD   5 mg at 05/10/22 1037   enoxaparin (LOVENOX) injection 40 mg  40 mg Subcutaneous Q24H Orma Flaming, MD   40 mg at 05/09/22 2048   gabapentin (NEURONTIN) capsule 300 mg  300 mg Oral TID Orma Flaming, MD   300 mg at 05/10/22 1037   methocarbamol (ROBAXIN) tablet 500 mg  500 mg Oral TID Orma Flaming, MD  500 mg at 05/10/22 1041   nicotine (NICODERM CQ - dosed in mg/24 hours) patch 14 mg  14 mg Transdermal Daily Orma Flaming, MD   14 mg at 05/10/22 1039   ondansetron (ZOFRAN) tablet 4 mg  4 mg Oral Q6H PRN Orma Flaming, MD       Or   ondansetron [facility]) injection 4 mg  4 mg Intravenous Q6H PRN Orma Flaming, MD       oxyCODONE (Oxy IR/ROXICODONE) immediate release tablet 5 mg  5 mg Oral Q4H PRN Orma Flaming, MD   5 mg at 05/10/22 0536   polyethylene glycol (MIRALAX / GLYCOLAX) packet 17 g  17 g Oral Daily Shelly Coss, MD   17 g at 05/09/22 1136   senna (SENOKOT) tablet 8.6 mg  1 tablet Oral BID Shelly Coss, MD   8.6 mg at 05/10/22 1037     Discharge Medications: Please see discharge summary for a list of discharge medications.  Relevant Imaging Results:  Relevant Lab Results:   Additional Information SS# [id number]  Angelita Ingles, RN

## 2022-05-10 NOTE — TOC Progression Note (Addendum)
Transition of Care [facility]) - Progression Note    Patient Details  Name: Sandra Horne MRN: [id number] Date of Birth: [birth year]  [facility] CM/SW Contact  Angelita Ingles, RN Phone Number:[phone]  05/10/2022, 2:31 PM  Clinical Narrative:    [facility] acknowledges consult for SNF placement. CM at bedside to offer patient choice. CM provided patient with medicare.goc list. Patient states that she and her husband will make the decision on facility once she receives bed offers. Pasrr # [id number]  PASRR is found under patients maiden name [name]. CM called [place] must to verify information. FL2 complete. Patient info has been faxed out for bed offers.   [place] initiated. CM will need to update facility after bed offers. [facility] [place] [id number].   Expected Discharge Plan: Skilled Nursing Facility Barriers to Discharge: SNF Pending bed offer  Expected Discharge Plan and Services In-house Referral: NA Discharge Planning Services: CM Consult Post Acute Care Choice: [place] Living arrangements for the past 2 months: Single Family Home                 DME Arranged: N/A DME Agency: NA       HH Arranged: NA           Social Determinants of Health (SDOH) Interventions SDOH Screenings   Food Insecurity: No Food Insecurity (05/09/2022)  Housing: Low Risk  (05/08/2022)  Transportation Needs: No Transportation Needs (05/09/2022)  Recent Concern: Transportation Needs - Unmet Transportation Needs (05/08/2022)  Utilities: Not At Risk (05/08/2022)  Financial Resource Strain: Low Risk  (01/14/2020)  Physical Activity: Inactive (01/14/2020)  Social Connections: Socially Isolated (01/14/2020)  Stress: No Stress Concern Present (01/14/2020)  Tobacco Use: High Risk (05/08/2022)    Readmission Risk Interventions    10/14/2021   10:00 AM  Readmission Risk Prevention Plan  Transportation Screening Complete  PCP or Specialist Appt within 5-7 Days Not  Complete  Home Care Screening Complete  Medication Review (RN CM) Complete

## 2022-05-10 NOTE — Progress Notes (Signed)
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  Recent MRI of the brain showed 11 new metastatic lesions and was recommended hospice approach.  Patient admitted for further management.  Palliative care consulted for goals of care.  Wants to remain full code and continue radiation treatment and other options for cancer.  PT recommending skilled nursing facility on discharge.  TOC consulted.She   Is medically stable for discharge whenever possible  Assessment & Plan:  Principal Problem:   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 [facility]) Active Problems:   Hypokalemia   Tobacco abuse   Thrombocytopenia (HCC)   Lung cancer metastatic to brain ([place])   Non-small cell lung cancer with mets to brain: Has residual right-sided weakness from brain mets.  Presented with increased weakness from baseline.  Patient MRI showed likely new metastatic lesions, was on radiation treatment but now has been recommended hospice approach.  But patient wants to continue radiation treatment and other options.  CT head showed mildly increased brain edema with increased rightward midline shift and mild increased right cerebral edema.  Continue Decadron 5 mg daily as per Dr. Mickeal Skinner. See remains alert and oriented. She recently completed 3/10 radiation treatments. Oncology, radiation oncology consulted/notified. 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  Remains on room air.  Hypokalemia: Supplemented and  corrected  Tobacco use: Counseled cessation.  Ordered nicotine patch  Right-sided weakness: Secondary to brain mets.  Has severe weakness on the right upper extremity.  PT/OT recommendation for discharge.  Husband agreeable to that  Constipation: Continue aggressive bowel regimen  Goals of care: Patient with metastatic lung cancer.  Recent MRI with evidence of increased metastatic lesions.  Very poor prognosis.  Palliative care consulted for goals of care.  Currently full code.  As per conversation with palliative care, she wants full to continue radiation treatment.         DVT prophylaxis:enoxaparin (LOVENOX) injection 40 mg Start: 05/08/22 2200     Code Status: Full Code  Family Communication:: Discussed with husband on phone from bedside on 2/12  Patient status:Inpatient  Patient is from :Home  Anticipated discharge to: SNF  Estimated DC date: 1 to 2 days.  Medically stable for discharge   Consultants: Palliative care  Procedures: None  Antimicrobials:  Anti-infectives (From admission, onward)    None       Subjective: Patient seen and examined at bedside today.  Lying in bed.  Alert and oriented.  Denies new complaints except for persistent weakness of the right side.  She has developed more weakness on the right side today mainly in her right upper extremity.  Long discussion had at the bedside again about goals of care.  She was to continue radiation treatment.  I also talked to her husband on phone.  They are agreeable for SNF on discharge as recommended by PT  Objective: Vitals:   05/09/22 0813 05/09/22 1319 05/09/22 2047 05/10/22 0440  BP: 121/80 127/82 134/71 139/82  Pulse: 80 86 81 67  Resp:  18 18 16 15   Temp: 97.9 F (36.6 C)  98.1 F (36.7 C) 97.8 F (36.6 C)  TempSrc: Oral  Oral Oral  SpO2: 98% 100% 99% 100%  Weight:      Height:        Intake/Output Summary (Last 24 hours) at 05/10/2022 1153 Last data filed at 05/10/2022 0453 Gross per 24  hour  Intake 240 ml  Output 700 ml  Net -460 ml   Filed Weights   05/08/22 1601  Weight: 68 kg    Examination:   General exam: Overall comfortable, not in distress, weak looking, present female HEENT: PERRL Respiratory system:  no wheezes or crackles  Cardiovascular system: S1 & S2 heard, RRR.  Gastrointestinal system: Abdomen is nondistended, soft and nontender. Central nervous system: Alert and oriented, weakness on the right upper extremity with power of 2/5, weakness of the right lower extremity with power of 4/5 Extremities: No edema, no clubbing ,no cyanosis Skin: No rashes, no ulcers,no icterus     Data Reviewed: I have personally reviewed following labs and imaging studies  CBC: Recent Labs  Lab 05/08/22 1643  WBC 10.0  NEUTROABS 5.5  HGB 12.3  HCT 39.6  MCV 91.0  PLT 485*   Basic Metabolic Panel: Recent Labs  Lab 05/08/22 1643 05/08/22 1733 05/09/22 0021  NA 137  --  136  K 2.8*  --  5.0  CL 108  --  106  CO2 24  --  20*  GLUCOSE 106*  --  177*  BUN 11  --  10  CREATININE 0.68  --  0.63  CALCIUM 7.7*  --  8.5*  MG  --  1.8  --      No results found for this or any previous visit (from the past 240 hour(s)).   Radiology Studies: CT Head Wo Contrast  Result Date: 05/08/2022 CLINICAL DATA:  Headache, neuro deficit. History of lung cancer with brain metastases. EXAM: CT HEAD WITHOUT CONTRAST TECHNIQUE: Contiguous axial images were obtained from the base of the skull through the vertex without intravenous contrast. RADIATION DOSE REDUCTION: This exam was performed according to the departmental dose-optimization program which includes automated exposure control, adjustment of the mA and/or kV according to patient size and/or use of iterative reconstruction technique. COMPARISON:  Head MRI 04/17/2022 FINDINGS: Brain: A known 2 cm cystic metastasis is again seen in the left parietal lobe. Extensive vasogenic edema throughout the left cerebral hemisphere has  mildly worsened with increased rightward midline shift, now measuring 10 mm (previously 8 mm). There is partial effacement of the left lateral ventricle. There is no evidence of intracapsular trapping. Mild edema associated with a metastasis in the posterior right temporal lobe has also likely mildly increased without associated mass effect. There is an approximately 2 cm lesion in the right cerebellar hemisphere without frank enlargement compared to the prior MRI, however prominent right cerebellar edema has likely mildly increased with increased mass effect on the fourth ventricle and mild mass effect on the right dorsal pons. Minimal left cerebellar edema has not significantly changed. No acute cortical infarct, intracranial hemorrhage, or extra-axial fluid collection is evident. Vascular: Calcified atherosclerosis at the skull base. No hyperdense vessel. Skull: No acute fracture or suspicious osseous lesion. Sinuses/Orbits: Visualized paranasal sinuses and mastoid air cells are clear. Were its are unremarkable. Other: None. IMPRESSION: 1. Mildly increased brain edema associated with known metastases, including extensive edema throughout the left cerebral hemisphere with increased rightward midline shift (now 10 mm).  2. Mildly increased right cerebellar edema with partial effacement of the fourth ventricle. No obstructive hydrocephalus. Electronically Signed   By: Logan Bores M.D.   On: 05/08/2022 17:51   DG Chest 2 View  Result Date: 05/08/2022 CLINICAL DATA:  Cough and wheezing. EXAM: CHEST - 2 VIEW COMPARISON:  04/06/2022, chest radiographs and chest CT. FINDINGS: Patchy opacity noted at the left lung base or spine Stu the airspace opacities noted on the previous day's CT. Remainder of the lungs is clear. Cardiac silhouette is normal in size. No mediastinal masses. Left hilar prominence corresponds to the mass noted on the previous day's CT. Normal right hilar contours. No pleural effusion or  pneumothorax. Skeletal structures are intact. IMPRESSION: 1. Left hilar mass and patchy opacities at the left lung base, the latter finding suspected to be postobstructive pneumonitis. Overall, no change from the previous day's chest CT. Electronically Signed   By: Lajean Manes M.D.   On: 05/08/2022 17:09    Scheduled Meds:  dexamethasone  5 mg Oral Daily   enoxaparin (LOVENOX) injection  40 mg Subcutaneous Q24H   gabapentin  300 mg Oral TID   methocarbamol  500 mg Oral TID   nicotine  14 mg Transdermal Daily   polyethylene glycol  17 g Oral Daily   senna  1 tablet Oral BID   Continuous Infusions:     LOS: 0 days   Shelly Coss, MD [facility] P2/02/2023, 11:53 AM

## 2022-05-11 ENCOUNTER — Ambulatory Visit
Admission: RE | Admit: 2022-05-11 | Discharge: 2022-05-11 | Disposition: A | Discharge status: Home / Self Care | Payer: 59 | Source: Ambulatory Visit | Attending: Radiation Oncology | Admitting: Radiation Oncology

## 2022-05-11 ENCOUNTER — Other Ambulatory Visit: Payer: Self-pay

## 2022-05-11 DIAGNOSIS — C7931 Secondary malignant neoplasm of brain: Secondary | ICD-10-CM | POA: Diagnosis not present

## 2022-05-11 DIAGNOSIS — Z87891 Personal history of nicotine dependence: Secondary | ICD-10-CM | POA: Diagnosis not present

## 2022-05-11 DIAGNOSIS — R531 Weakness: Secondary | ICD-10-CM

## 2022-05-11 DIAGNOSIS — Z515 Encounter for palliative care: Secondary | ICD-10-CM | POA: Diagnosis not present

## 2022-05-11 DIAGNOSIS — C3492 Malignant neoplasm of unspecified part of left bronchus or lung: Secondary | ICD-10-CM | POA: Diagnosis not present

## 2022-05-11 DIAGNOSIS — Z7189 Other specified counseling: Secondary | ICD-10-CM

## 2022-05-11 DIAGNOSIS — C3490 Malignant neoplasm of unspecified part of unspecified bronchus or lung: Secondary | ICD-10-CM | POA: Diagnosis not present

## 2022-05-11 DIAGNOSIS — R52 Pain, unspecified: Secondary | ICD-10-CM | POA: Diagnosis not present

## 2022-05-11 DIAGNOSIS — Z510 Encounter for antineoplastic radiation therapy: Secondary | ICD-10-CM | POA: Diagnosis not present

## 2022-05-11 DIAGNOSIS — G936 Cerebral edema: Secondary | ICD-10-CM

## 2022-05-11 DIAGNOSIS — C3432 Malignant neoplasm of lower lobe, left bronchus or lung: Secondary | ICD-10-CM | POA: Diagnosis not present

## 2022-05-11 LAB — RAD ONC ARIA SESSION SUMMARY
Course Elapsed Days: 8
Plan Fractions Treated to Date: 5
Plan Prescribed Dose Per Fraction: 3 Gy
Plan Total Fractions Prescribed: 10
Plan Total Prescribed Dose: 30 Gy
Reference Point Dosage Given to Date: 15 Gy
Reference Point Session Dosage Given: 3 Gy
Session Number: 5

## 2022-05-11 LAB — GLUCOSE, CAPILLARY
Glucose-Capillary: 105 mg/dL — ABNORMAL HIGH (ref 70–99)
Glucose-Capillary: 134 mg/dL — ABNORMAL HIGH (ref 70–99)
Glucose-Capillary: 201 mg/dL — ABNORMAL HIGH (ref 70–99)
Glucose-Capillary: 239 mg/dL — ABNORMAL HIGH (ref 70–99)

## 2022-05-11 MED ORDER — DEXAMETHASONE 4 MG PO TABS
4.0000 mg | ORAL_TABLET | Freq: Four times a day (QID) | ORAL | Status: DC
  Administered 2022-05-11 – 2022-05-12 (×4): 4 mg via ORAL
  Filled 2022-05-11 (×4): qty 1

## 2022-05-11 MED ORDER — DEXAMETHASONE SODIUM PHOSPHATE 10 MG/ML IJ SOLN
10.0000 mg | Freq: Once | INTRAMUSCULAR | Status: AC
  Administered 2022-05-11: 10 mg via INTRAVENOUS
  Filled 2022-05-11: qty 1

## 2022-05-11 NOTE — TOC Progression Note (Addendum)
Transition of Care [facility]) - Progression Note    Patient Details  Name: ALIANNA WURSTER MRN: [id number] Date of Birth: [birth year]  Transition of Care [facility]) CM/SW Contact  Angelita Ingles, RN Phone Number:[phone]  05/11/2022, 1:27 PM  Clinical Narrative:    TOC following for placement.  Barrier noted due to patient has daily radiation treatments / patient now states that she does not want to go to facility but she has no transportation to treatments and no one to be at home  to care for her. Patient has given CM permission to call husband to discuss disposition plan. CM attempted to call husband Taquilla Downum @ [phone]. CM has called this number but this is a bank of [place] number and there is no one by that name at the establishment. Cm attempted top contact daughter Thurston Pounds. Daughter states that she is on her way to the hospital and will be available to discuss disposition with patient and CM. Daughter to call CM when she arrives.   1430 Daughter at bedside. CM at bedside to speak with daughter about disposition planning. Daughter states that patient and her husband live with her and that patient is ok to return home because she has plenty of support to assist with her care. Daughter is requesting that MD call her or come by room to discuss why her mother was admitted and what the plan is. Dr. Tawanna Solo has been made aware.   1500 CM called back to bedside once per daughter because patient has returned to the room. CM asked patient about discharge plan. Patient states that she wants to go home with daughter. Patient/ daughter requesting hospital bed. Message has been sent to MD for orders.   [facility] bed has been ordered per [facility]. Office will contact daughter about delivery.      Expected Discharge Plan: Skilled Nursing Facility Barriers to Discharge: SNF Pending bed offer  Expected Discharge Plan and Services In-house Referral: NA Discharge Planning  Services: CM Consult Post Acute Care Choice: [place] Living arrangements for the past 2 months: Single Family Home                 DME Arranged: N/A DME Agency: NA       HH Arranged: NA           Social Determinants of Health (SDOH) Interventions SDOH Screenings   Food Insecurity: No Food Insecurity (05/09/2022)  Housing: Low Risk  (05/08/2022)  Transportation Needs: No Transportation Needs (05/09/2022)  Recent Concern: Transportation Needs - Unmet Transportation Needs (05/08/2022)  Utilities: Not At Risk (05/08/2022)  Financial Resource Strain: Low Risk  (01/14/2020)  Physical Activity: Inactive (01/14/2020)  Social Connections: Socially Isolated (01/14/2020)  Stress: No Stress Concern Present (01/14/2020)  Tobacco Use: High Risk (05/08/2022)    Readmission Risk Interventions    10/14/2021   10:00 AM  Readmission Risk Prevention Plan  Transportation Screening Complete  PCP or Specialist Appt within 5-7 Days Not Complete  Home Care Screening Complete  Medication Review (RN CM) Complete

## 2022-05-11 NOTE — Progress Notes (Addendum)
    Durable Medical Equipment  (From admission, onward)           Start     Ordered   05/11/22 1515  For home use only DME Hospital bed  Once       Question Answer Comment  Length of Need Lifetime   Bed type Semi-electric      05/11/22 1514

## 2022-05-11 NOTE — Progress Notes (Signed)
  Radiation Oncology         [phone] ________________________________  Name: Sandra Horne MRN: [id number]  Date: 05/08/2022  DOB: 1956-03-10  Chart Note:  I am just reviewing this patient who is getting brain radiation for brain metastases.  She was admitted for hemiparesis related to cerebral edema.  In this setting, the current 5 mg of dexamethasone daily may be sub-therapeutic.  I took the liberty of ordering 10 mg if dexamethasone IV now and 4 mg q6 hours in hopes of improving her hemiparesis, the impact should be seen in 24 hours, if that helps.  ________________________________  Sheral Apley. Tammi Klippel, M.D.

## 2022-05-11 NOTE — Progress Notes (Addendum)
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  Recent MRI of the brain showed 11 new metastatic lesions and was recommended hospice approach.  Patient admitted for further management.  Palliative care consulted for goals of care.  Wants to remain full code and continue radiation treatment and other options for cancer.  PT recommending skilled nursing facility on discharge.  TOC consulted.She   Is medically stable for discharge whenever possible  Assessment & Plan:  Principal Problem:   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 [facility]) Active Problems:   Hypokalemia   Tobacco abuse   Thrombocytopenia (HCC)   Lung cancer metastatic to brain ([place])   Non-small cell lung cancer with mets to brain: Has residual right-sided weakness from brain mets.  Presented with increased weakness from baseline.  Patient MRI showed likely new metastatic lesions, was on radiation treatment but now has been recommended hospice approach.  But patient wants to continue radiation treatment and other options.  CT head showed mildly increased brain edema with increased rightward midline shift and mild increased right cerebral edema.  Continue Decadron as ordered See remains alert and oriented. She recently completed 3/10 radiation treatments. Oncology, radiation oncology notified about her presence here. 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  Remains on room air.  Right-sided weakness: Secondary to  brain mets.  Has severe weakness on the right upper extremity.  PT/OT recommendation is  SNF  for discharge.  Husband was agreeable to that but noe family wants to go home with [facility] for hospital bed delivery  Hypokalemia: Supplemented and corrected  Tobacco use: Counseled cessation.  Ordered nicotine patch  Constipation: Continue aggressive bowel regimen  Goals of care: Patient with metastatic lung cancer.  Recent MRI with evidence of increased metastatic lesions.  Very poor prognosis.  Palliative care consulted for goals of care.  Currently full code.  As per conversation with palliative care, she wants full to continue radiation treatment.         DVT prophylaxis:enoxaparin (LOVENOX) injection 40 mg Start: 05/08/22 2200     Code Status: Full Code  Family Communication:: Discussed with husband on phone from bedside on 2/12  Patient status:Inpatient  Patient is from :Home  Anticipated discharge to: Home with [facility]  Estimated DC date: likely tomorrow   Consultants: Palliative care  Procedures: None  Antimicrobials:  Anti-infectives (From admission, onward)    None       Subjective:   Patient seen and examined at bedside today.  Lying in bed.  Hemodynamically stable.  Alert and oriented.  Continues to have severe weakness on the right side especially in the right upper extremity.  Continues to say that she is interested on taking radiation treatment and other options for cancer.    Objective: Vitals:   05/10/22 0440 05/10/22 1410 05/10/22 2138 05/11/22 0623  BP: 139/82 (!) 122/94 117/78 134/81  Pulse: 67 83 82 87  Resp: 15 16 17 17   Temp: 97.8 F (36.6 C) 98.6 F (37 C) 98.3 F (  36.8 C) 97.9 F (36.6 C)  TempSrc: Oral Oral Oral Oral  SpO2: 100% 100% 97% 98%  Weight:      Height:        Intake/Output Summary (Last 24 hours) at 05/11/2022 1121 Last data filed at 05/11/2022 1108 Gross per 24 hour  Intake 1066 ml  Output 1000 ml  Net 66 ml   Filed  Weights   05/08/22 1601  Weight: 68 kg    Examination:  General exam: Overall comfortable, not in distress,weak HEENT: PERRL Respiratory system:  no wheezes or crackles  Cardiovascular system: S1 & S2 heard, RRR.  Gastrointestinal system: Abdomen is nondistended, soft and nontender. Central nervous system: Alert and oriented, severe weakness of the right upper extremity with power of 2/5, right lower extremity weakness with power of 4/5 Extremities: No edema, no clubbing ,no cyanosis Skin: No rashes, no ulcers,no icterus     Data Reviewed: I have personally reviewed following labs and imaging studies  CBC: Recent Labs  Lab 05/08/22 1643  WBC 10.0  NEUTROABS 5.5  HGB 12.3  HCT 39.6  MCV 91.0  PLT 102*   Basic Metabolic Panel: Recent Labs  Lab 05/08/22 1643 05/08/22 1733 05/09/22 0021  NA 137  --  136  K 2.8*  --  5.0  CL 108  --  106  CO2 24  --  20*  GLUCOSE 106*  --  177*  BUN 11  --  10  CREATININE 0.68  --  0.63  CALCIUM 7.7*  --  8.5*  MG  --  1.8  --      No results found for this or any previous visit (from the past 240 hour(s)).   Radiology Studies: No results found.  Scheduled Meds:  dexamethasone  5 mg Oral Daily   enoxaparin (LOVENOX) injection  40 mg Subcutaneous Q24H   gabapentin  300 mg Oral TID   methocarbamol  500 mg Oral TID   nicotine  14 mg Transdermal Daily   polyethylene glycol  17 g Oral Daily   senna  1 tablet Oral BID   Continuous Infusions:     LOS: 0 days   Shelly Coss, MD [facility] P2/13/2024, 11:21 AM

## 2022-05-11 NOTE — Progress Notes (Signed)
Physical Therapy Treatment Patient Details Name: Sandra Horne MRN: [id number] DOB: [birth year] Today's Date: 05/11/2022   History of Present Illness 67 year old female who presented from home with weakness, poor oral intake. Recent MRI of the brain showed 11 new metastatic lesions and was recommended hospice approach. Patient admitted for further management. Palliative care consulted for goals of care. 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    Recommendations for follow up therapy are one component of a multi-disciplinary discharge planning process, led by the attending physician.  Recommendations may be updated based on patient status, additional functional criteria and insurance authorization.  Follow Up Recommendations  Skilled nursing-short term rehab (<3 hours/day) Can patient physically be transported by private vehicle: No   Assistance Recommended at Discharge    Patient can return home with the following A lot of help with walking and/or transfers;A lot of help with bathing/dressing/bathroom;Assistance with cooking/housework;Assist for transportation   Equipment Recommendations  None recommended by PT    Recommendations for Other Services       Precautions / Restrictions Precautions Precautions: Fall Precaution Comments: Right-sided hemiparesis Restrictions Weight Bearing Restrictions: No     Mobility  Bed Mobility Overal bed mobility: Needs Assistance Bed Mobility: Supine to Sit     Supine to sit: Min assist, HOB elevated     General bed mobility comments: assist to raise trunk, VCs for technique;  posteriro lean in sitting (VCs to lean forward)    Transfers Overall transfer level: Needs assistance Equipment used: Rolling walker (2 wheels) Transfers: Sit to/from Stand Sit to Stand: Min assist           General transfer comment: VCs hand placement/technique, min A to power up    Ambulation/Gait Ambulation/Gait assistance: Min Web designer (Feet): 18 Feet Assistive device: Rolling walker (2 wheels) Gait Pattern/deviations: Step-to pattern Gait velocity: decr     General Gait Details: VCs to increase step length on R, initially required manual assist to advance RLE then was able to advance it independently   Stairs             Wheelchair Mobility    Modified Rankin (Stroke Patients Only)       Balance Overall balance assessment: Needs assistance Sitting-balance support: No upper extremity supported, Feet supported Sitting balance-Leahy Scale: Fair     Standing balance support: Bilateral upper extremity supported, Reliant on assistive device for balance Standing balance-Leahy Scale: Poor                              Cognition Arousal/Alertness: Awake/alert Behavior During Therapy: WFL for tasks assessed/performed Overall Cognitive Status: Within Functional Limits for tasks assessed                                          Exercises      General Comments        Pertinent Vitals/Pain Pain Assessment Pain Assessment: No/denies pain    Home Living  Prior Function            PT Goals (current goals can now be found in the care plan section) Acute Rehab PT Goals PT Goal Formulation: With patient Time For Goal Achievement: 05/23/22 Potential to Achieve Goals: Good Progress towards PT goals: Progressing toward goals    Frequency    Min 2X/week      PT Plan Current plan remains appropriate    Co-evaluation              AM-PAC PT "6 Clicks" Mobility    Outcome Measure  Help needed turning from your back to your side while in a flat bed without using bedrails?: A Little Help needed moving from lying on your back to sitting on the side of a flat bed without using bedrails?: A Little Help needed moving to and from a bed to a chair (including a wheelchair)?: A Little Help needed standing up from a chair using your arms (e.g., wheelchair or bedside chair)?: A Little Help needed to walk in hospital room?: A Lot Help needed climbing 3-5 steps with a railing? : Total 6 Click Score: 15    End of Session Equipment Utilized During Treatment: Gait belt Activity Tolerance: Patient tolerated treatment well Patient left: in chair;with chair alarm set;with call bell/phone within reach Nurse Communication: Mobility status PT Visit Diagnosis: Difficulty in walking, not elsewhere classified (R26.2);Unsteadiness on feet (R26.81);Muscle weakness (generalized) (M62.81);Other abnormalities of gait and mobility (R26.89);Hemiplegia and hemiparesis Hemiplegia - Right/Left: Right Hemiplegia - dominant/non-dominant: Non-dominant Hemiplegia - caused by:  (brain metastases)     Time: 1660-6004 PT Time Calculation (min) (ACUTE ONLY): 18 min  Charges:  $Gait Training: 8-22 mins                    Blondell Reveal Kistler PT 05/11/2022  Acute Rehabilitation Services  Office [phone]

## 2022-05-11 NOTE — Progress Notes (Signed)
Daily Progress Note   Patient Name: Sandra Horne       Horne: 05/11/2022 DOB: 1955-12-22  Age: 67 y.o. MRN#: [id number] Attending Physician: Sandra Coss, MD Primary Care Physician: Sandra Horne: 05/08/2022 Length of Stay: 0 days  Reason for Consultation/Follow-up: Establishing goals of care and symptom management  Subjective:   CC: Patient denies any concerns at this time. Feels oxycodone helping pain. Following up in complex medical decision making and symptom management.  Subjective:  At time of EMR review prior to seeing patient, patient has received oxycodone 5 mg x 3 doses.  Presented to bedside and introduced myself as a member of the palliative medicine team and colleague of Sandra Horne. Rowe Horne.  Spent time exploring how patient was feeling today in regards of her symptoms.  Patient noted that she is having some right leg pain though this is improved with the oxycodone.  Encouraged her that the oxycodone is as needed and she can ask for it if she does feel the pain worsening to the point it is limiting her functional mobility.  Patient noted she would do so.  Then spent time exploring what patient was hoping for moving forward.  Patient noted that she wants to continue with radiation treatments to "get well".  Tried to explore background of why patient needed radiation and patient is able to state that she knows it is from inflammation though she cannot directly tell me the edema in her brain  is secondary from her lung cancer.  Did spend time explaining this to patient. Then spent time exploring what "getting well" would mean to her.  Patient noted that when she does feel better she likes to play speeds with her sister on the weekends.  Prior to hospitalization she noted that she was living with her daughter and husband though there has been some psychosocial concerns so patient is hoping to live separately with her husband.  Patient noted she has heard that she may go to a nursing  facility but she does not want this permanently.  We discussed the idea of rehab to get stronger so that she could be as functionally independent as possible at home; patient is open to the idea of rehab just not permanent nursing home placement.  With permission, discussed with patient who she would want to make medical decisions for her if she was unable to speak for herself.  Patient was married 2 years ago.  Patient noted that she would want her husband, Sandra Horne, to make medical decisions for her if she was unable to do so for herself.  We discussed importance of talking with her husband about what a good quality of life would mean to her and what kind of interventions would be appropriate based on her goals for medical care.  Patient acknowledged this.  Did express to patient that based on the fact her cancer has gone to multiple places in her body, any therapy she is currently getting are to slow down the cancer/keep it at [place], though these therapies do not get rid of the cancer completely.  This is why patient needs to continue important conversations with family. And when patient's husband would be by to visit and she noted that he usually comes after 4 PM.  Noted this provider would make an attempt if able to return and meet her husband as well to introduce myself.  Patient voiced appreciation for this.  All questions answered at that time.  Noted this palliative medicine  provider would continue to follow along with patient's journey.  Objective:   Vital Signs:  BP 134/81 (BP Location: Right Arm)   Pulse 87   Temp 97.9 F (36.6 C) (Oral)   Resp 17   Ht 5\' 6"  (1.676 m)   Wt 68 kg   SpO2 98%   BMI 24.20 kg/m   Physical Exam: General: NAD, alert, laying in bed Eyes: No discharge noted HENT: moist mucous membranes Cardiovascular: RRR Respiratory: no increased work of breathing noted, not in respiratory distress Abdomen: not distended Extremities: Moving upper extremities  appropriately Skin: no rashes or lesions on visible skin Neuro: following commands easily Psych: Calm, pleasant  Imaging:  I personally reviewed recent imaging.   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  Recent MRI had shown 11 new metastatic lesions.  Patient is known to the palliative medicine service as is seen by palliative colleague Ms. Cousar, NP at [facility].  Palliative medicine team consulted to assist with complex medical decision making and symptom management.  Recommendations/Plan: # Complex medical decision making/goals of care:  - This was this provider's first interaction with the patient.  Patient hoping to "get well" though not able to fully discuss her diagnosis of stage IV lung cancer and the fact that therapies are currently aimed at slowing the progression versus curing the disease.  Discussed importance of conversation with family regarding medical interventions moving forward knowing that she has a cancer that is not curable.  Would encourage conversations with patient and family together moving forward to allow optimal support for patient's decision making.  -Patient stated if she was unable to make medical decisions for herself, she would want her husband, Sandra Horne, to make medical decisions for her.   -Patient does not want to be placed in a nursing facility long-term that was willing to pursue rehab with the hope of maintaining her independence as long as able.  -  Code Status: Full Code  # Symptom management:  -Pain   -Currently managed on oxycodone 5mg  q4hrs prn   -Also  receiving Decadron 5 mg daily  # Psychosocial Support:  -Husband, daughter  # Discharge Planning: [place] for rehab with Palliative care service follow-up  -Patient known to palliative colleague Ms. Cousar, NP at [facility] and will need follow-up with her there  Discussed with: patient, RN  Thank you for allowing the palliative care team to participate in the care [place].  Chelsea Aus, DO Palliative Care Provider PMT # [phone]  If patient remains symptomatic despite maximum doses, please call PMT at [phone] between 0700 and 1900. Outside of these hours, please call attending, as PMT does not have night coverage.  This provider spent a total of 37 minutes providing patient's care.  Includes review of EMR, discussing care with other staff members involved in patient's medical care, obtaining relevant history and information from patient and/or patient's family, and personal review of imaging and lab work. Greater than 50% of the time was spent counseling and coordinating care related to the above assessment and plan.

## 2022-05-12 ENCOUNTER — Ambulatory Visit
Admission: RE | Admit: 2022-05-12 | Discharge: 2022-05-12 | Disposition: A | Discharge status: Home / Self Care | Payer: 59 | Source: Ambulatory Visit | Attending: Radiation Oncology | Admitting: Radiation Oncology

## 2022-05-12 ENCOUNTER — Other Ambulatory Visit: Payer: Self-pay

## 2022-05-12 DIAGNOSIS — C3490 Malignant neoplasm of unspecified part of unspecified bronchus or lung: Secondary | ICD-10-CM | POA: Diagnosis not present

## 2022-05-12 DIAGNOSIS — C3492 Malignant neoplasm of unspecified part of left bronchus or lung: Secondary | ICD-10-CM | POA: Diagnosis not present

## 2022-05-12 DIAGNOSIS — Z510 Encounter for antineoplastic radiation therapy: Secondary | ICD-10-CM | POA: Diagnosis not present

## 2022-05-12 DIAGNOSIS — C7931 Secondary malignant neoplasm of brain: Secondary | ICD-10-CM | POA: Diagnosis not present

## 2022-05-12 LAB — RAD ONC ARIA SESSION SUMMARY
Course Elapsed Days: 9
Plan Fractions Treated to Date: 6
Plan Prescribed Dose Per Fraction: 3 Gy
Plan Total Fractions Prescribed: 10
Plan Total Prescribed Dose: 30 Gy
Reference Point Dosage Given to Date: 18 Gy
Reference Point Session Dosage Given: 3 Gy
Session Number: 6

## 2022-05-12 LAB — GLUCOSE, CAPILLARY
Glucose-Capillary: 171 mg/dL — ABNORMAL HIGH (ref 70–99)
Glucose-Capillary: 140 mg/dL — ABNORMAL HIGH (ref 70–99)

## 2022-05-12 LAB — BASIC METABOLIC PANEL
Anion gap: 12 (ref 5–15)
BUN: 11 mg/dL (ref 8–23)
CO2: 20 mmol/L — ABNORMAL LOW (ref 22–32)
Calcium: 8.9 mg/dL (ref 8.9–10.3)
Chloride: 102 mmol/L (ref 98–111)
Creatinine, Ser: 0.71 mg/dL (ref 0.44–1.00)
GFR, Estimated: >60 mL/min (ref >60)
Glucose, Bld: 197 mg/dL — ABNORMAL HIGH (ref 70–99)
Potassium: 4.1 mmol/L (ref 3.5–5.1)
Sodium: 134 mmol/L — ABNORMAL LOW (ref 135–145)

## 2022-05-12 MED ORDER — SENNA 8.6 MG PO TABS: 1.0000 | ORAL_TABLET | Freq: Two times a day (BID) | ORAL | 0 refills | Status: AC

## 2022-05-12 MED ORDER — LEVETIRACETAM 500 MG PO TABS: 500.0000 mg | ORAL_TABLET | Freq: Two times a day (BID) | ORAL | 1 refills | Status: DC

## 2022-05-12 MED ORDER — DEXAMETHASONE 4 MG PO TABS: 4.0000 mg | ORAL_TABLET | Freq: Four times a day (QID) | ORAL | 0 refills | Status: DC

## 2022-05-12 MED ORDER — LIDOCAINE 5 % EX PTCH: 1.0000 | MEDICATED_PATCH | CUTANEOUS | 0 refills | Status: DC

## 2022-05-12 MED ORDER — NAPROXEN 375 MG PO TABS: 375.0000 mg | ORAL_TABLET | Freq: Two times a day (BID) | ORAL | 0 refills | Status: DC | PRN

## 2022-05-12 MED ORDER — POLYETHYLENE GLYCOL 3350 17 G PO PACK: 17.0000 g | PACK | Freq: Every day | ORAL | 0 refills | Status: DC

## 2022-05-12 MED ORDER — PANTOPRAZOLE SODIUM 40 MG PO TBEC: 40.0000 mg | DELAYED_RELEASE_TABLET | Freq: Every day | ORAL | 1 refills | Status: DC

## 2022-05-12 NOTE — TOC Progression Note (Signed)
Transition of Care [facility]) - Progression Note    Patient Details  Name: Sandra Horne MRN: [id number] Date of Birth: [birth year]  Transition [facility] CM/SW Contact  Angelita Ingles, RN Phone Number:[phone]  05/12/2022, 2:56 PM  Clinical Narrative:    Cm at bedside to discuss home health with patient . Previous conversation from yesterday with patient and family. Patient was not sure that she wanted to follow up with home health due to daily radiation therapy ans having family home to assist with all ADL's. CM back at bedside today to determine if patient has changed mind  and would like home health at discharge. Patient states that she does not need home health right now  as previously stated. No home health set up at this time.    Expected Discharge Plan: Skilled Nursing Facility Barriers to Discharge: SNF Pending bed offer  Expected Discharge Plan and Services In-house Referral: NA Discharge Planning Services: CM Consult Post Acute Care Choice: [place] Living arrangements for the past 2 months: Single Family Home Expected Discharge Date: 05/12/22               DME Arranged: N/A DME Agency: NA       HH Arranged: NA           Social Determinants of Health (SDOH) Interventions SDOH Screenings   Food Insecurity: No Food Insecurity (05/09/2022)  Housing: Low Risk  (05/08/2022)  Transportation Needs: No Transportation Needs (05/09/2022)  Recent Concern: Transportation Needs - Unmet Transportation Needs (05/08/2022)  Utilities: Not At Risk (05/08/2022)  Financial Resource Strain: Low Risk  (01/14/2020)  Physical Activity: Inactive (01/14/2020)  Social Connections: Socially Isolated (01/14/2020)  Stress: No Stress Concern Present (01/14/2020)  Tobacco Use: High Risk (05/08/2022)    Readmission Risk Interventions    10/14/2021   10:00 AM  Readmission Risk Prevention Plan  Transportation Screening Complete  PCP or Specialist Appt within 5-7 Days Not  Complete  Home Care Screening Complete  Medication Review (RN CM) Complete

## 2022-05-12 NOTE — Progress Notes (Signed)
Occupational Therapy Treatment Patient Details Name: Sandra Horne MRN: [id number] DOB: 08-25-1955 Today's Date: 05/12/2022   History of present illness Patient is a 67 year old female who presented to the hospital with poor oral intake and weakness. Patient imaging revealed 11 new metastatic lesions. pallative has been consulted. PMH: non-small cell lung cancer with mets to brain resulting in R side weakness, radiation treatemnt, R femoral neck fx s/p right hemiarthroplasty 7/23, tobacco use.   OT comments  Patient was noted to have some improvement in ability to AROM shoulder FF about 15 degrees from previous session. Patient continues to have decreased safety awareness impacting patients falls risk. Patient reported she planned to transition home with family support. Patient was educated on importance of having someone 24/7 physical assistance as demonstrated in session where patient is unable to control landing into recliner. Patient reported she would make it work with the support she has. Continue to recommend short term rehab services to improve patients ability to safely engage in ADLs and reduce caregiver burden in next level of care. Patient's discharge plan remains appropriate at this time. OT will continue to follow acutely.     Recommendations for follow up therapy are one component of a multi-disciplinary discharge planning process, led by the attending physician.  Recommendations may be updated based on patient status, additional functional criteria and insurance authorization.    Follow Up Recommendations  Skilled nursing-short term rehab (<3 hours/day)     Assistance Recommended at Discharge Frequent or constant Supervision/Assistance  Patient can return home with the following  A lot of help with walking and/or transfers;A lot of help with bathing/dressing/bathroom;Assistance with cooking/housework;Direct supervision/assist for medications management;Assist for  transportation;Direct supervision/assist for financial management;Help with stairs or ramp for entrance         Precautions / Restrictions Precautions Precautions: Fall Precaution Comments: Right-sided hemiparesis Restrictions Weight Bearing Restrictions: No       Mobility Bed Mobility Overal bed mobility: Needs Assistance Bed Mobility: Supine to Sit     Supine to sit: Min assist, HOB elevated     General bed mobility comments: assist to raise trunk, VCs for technique          ADL either performed or assessed with clinical judgement   ADL Overall ADL's : Needs assistance/impaired       Grooming Details (indicate cue type and reason): patient declined reporting she completed ADLs already this AM.                 Toilet Transfer: Minimal assistance;Ambulation;Rolling walker (2 wheels) Toilet Transfer Details (indicate cue type and reason): with cues for sequencing of task. patient needed physical A to not plop into recliner when transitioning to sitting.                Extremity/Trunk Assessment Upper Extremity Assessment Upper Extremity Assessment: RUE deficits/detail RUE Deficits / Details: patient was able to AROM FF to about 75 degrees on this date, continues to have about 15 degrees AROM of elbow as noted in evaluation note.             Cognition Arousal/Alertness: Awake/alert Behavior During Therapy: WFL for tasks assessed/performed Overall Cognitive Status: Within Functional Limits for tasks assessed       General Comments: noted to have poor safety awareness and some difficulty with insiget to deficits. otherwise cooperative and plesant during session        Exercises Other Exercises Other Exercises: patient was able to participate in 6 sit  to stands with sequencing cues for sit to stand. patient needed physical A to prevent plopping into recliner with patient unable to slow down desent into the recliner. even with increased cues and  proper positioning.            Pertinent Vitals/ Pain       Pain Assessment Pain Assessment: No/denies pain         Frequency  Min 2X/week        Progress Toward Goals  OT Goals(current goals can now be found in the care plan section)  Progress towards OT goals: Progressing toward goals     Plan Discharge plan remains appropriate       AM-PAC OT "6 Clicks" Daily Activity     Outcome Measure   Help from another person eating meals?: A Little Help from another person taking care of personal grooming?: A Little Help from another person toileting, which includes using toliet, bedpan, or urinal?: A Lot Help from another person bathing (including washing, rinsing, drying)?: A Lot Help from another person to put on and taking off regular upper body clothing?: A Lot Help from another person to put on and taking off regular lower body clothing?: A Lot 6 Click Score: 14    End of Session Equipment Utilized During Treatment: Gait belt;Rolling walker (2 wheels)  OT Visit Diagnosis: Unsteadiness on feet (R26.81);Other abnormalities of gait and mobility (R26.89);Muscle weakness (generalized) (M62.81)   Activity Tolerance Patient tolerated treatment well   Patient Left in chair;with call bell/phone within reach;with chair alarm set   Nurse Communication Mobility status        Time: 3794-3276 OT Time Calculation (min): 17 min  Charges: OT General Charges $OT Visit: 1 Visit OT Treatments $Therapeutic Activity: 8-22 mins  Rennie Plowman, MS Acute Rehabilitation Department Office# [phone]   Willa Rough 05/12/2022, 11:53 AM

## 2022-05-12 NOTE — Discharge Summary (Signed)
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  Recent MRI of the brain showed 11 new metastatic lesions and was recommended hospice approach.  Patient admitted for further management.  Palliative care consulted for goals of care.  Wants to remain full code and continue radiation treatment and other options for cancer.  PT recommending skilled nursing facility on discharge but patient /family prefer home with home health.  Medically stable for discharge.  She will continue radiation treatment as an outpatient  Following problems were addressed during the hospitalization:  Non-small cell lung cancer with mets to brain: Has residual right-sided weakness from brain mets.  Presented with increased weakness from baseline.  Patient MRI showed likely new metastatic lesions, was on radiation treatment but now was  recommended hospice approach.  But patient wants to continue radiation treatment and other options.  CT head showed mildly increased brain edema with increased rightward midline shift and mild increased right cerebral edema.  She remains alert and mostly oriented. 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  Remains on room air. She will continue radiation treatment as an outpatient.  Continue Decadron, can be tapered  as an outpatient.  Continue Keppra   Right-sided weakness: Secondary to brain mets.  Has weakness on the right upper extremity.  PT/OT recommendation is  SNF  for .patient/family want to go home with [facility], ordered hospital bed    Hypokalemia: Supplemented and corrected   Tobacco use: Counseled cessation.     Constipation: Continue aggressive bowel regimen   Goals of care: Patient with metastatic lung cancer.  Recent MRI with evidence of increased metastatic lesions.  Very poor prognosis.  Palliative care consulted for goals of care.  Currently full code.  As per conversation with palliative care, she wants full to continue radiation treatment.     Discharge Diagnoses:  Principal Problem:   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 [facility]) Active Problems:   Hypokalemia   Tobacco abuse   Thrombocytopenia (HCC)   Lung cancer metastatic to brain [facility])   Palliative care encounter   Pain   Right sided weakness   Cerebral edema (HCC)   Metastasis to brain [facility])   Goals of care, counseling/discussion    Discharge Instructions  Discharge Instructions     Diet general   Complete by: As directed    Discharge instructions   Complete by: As directed    1)Please continue radiation treatment as outpatient.Follow up with Dr Tammi Klippel 2)Take prescribed medications as instructed   Increase activity slowly   Complete by: As directed       Allergies as of 05/12/2022   No Known Allergies      Medication List     TAKE these medications    aspirin EC 81 MG tablet Take 81 mg by mouth daily. Swallow whole.   dexamethasone 4 MG tablet Commonly known as:  DECADRON Take 1 tablet (4 mg total) by mouth every 6 (six) hours for 15 days. What changed:  medication strength how much to take when to take this   gabapentin 300 MG capsule Commonly known as: NEURONTIN Take 1 capsule (300 mg total) by mouth 3 (three) times daily. What changed:  when to take  this reasons to take this   ibuprofen 800 MG tablet Commonly known as: ADVIL Take 1 tablet (800 mg total) by mouth every 8 (eight) hours as needed. What changed: reasons to take this   levETIRAcetam 500 MG tablet Commonly known as: KEPPRA Take 1 tablet (500 mg total) by mouth 2 (two) times daily.   lidocaine 5 % Commonly known as: Lidoderm Place 1 patch onto the skin daily. Remove & Discard patch within 12 hours or as directed by MD   methocarbamol 500 MG tablet Commonly known as: ROBAXIN Take 1 tablet (500 mg total) by mouth 3 (three) times daily. What changed:  when to take this reasons to take this   naproxen 375 MG tablet Commonly known as: NAPROSYN Take 1 tablet (375 mg total) by mouth every 12 (twelve) hours as needed. What changed:  when to take this reasons to take this   [place] TD Place 1 patch onto the skin daily as needed (for smoking cessation while hospitalized).   oxyCODONE 5 MG immediate release tablet Commonly known as: Roxicodone Take 1 tablet (5 mg total) by mouth every 6 (six) hours as needed for breakthrough pain. What changed:  when to take this reasons to take this   pantoprazole 40 MG tablet Commonly known as: PROTONIX Take 1 tablet (40 mg total) by mouth daily.   polyethylene glycol 17 g packet Commonly known as: MIRALAX / GLYCOLAX Take 17 g by mouth daily.   senna 8.6 MG Tabs tablet Commonly known as: SENOKOT Take 1 tablet (8.6 mg total) by mouth 2 (two) times daily for 14 days.               Durable Medical Equipment  (From admission, onward)           Start     Ordered   05/11/22 1515  For home use only DME Hospital bed  Once       Question Answer Comment  Length of Need Lifetime   Bed type Semi-electric      05/11/22 1514            Follow-up Information     Tyler Pita, MD. Schedule an appointment as soon as possible for a visit in 1 week(s).   Specialty: Radiation Oncology Contact  information: [place] [phone]                No Known Allergies  Consultations: Palliative care, radiation oncology   Procedures/Studies: CT Head Wo Contrast  Result Date: 05/08/2022 CLINICAL DATA:  Headache, neuro deficit. History of lung cancer with brain metastases. EXAM: CT HEAD WITHOUT CONTRAST TECHNIQUE: Contiguous axial images were obtained from the base of the skull through the vertex without intravenous contrast. RADIATION DOSE REDUCTION: This exam was performed according to the departmental dose-optimization program which includes automated exposure control, adjustment of the mA and/or kV according to patient size and/or use of iterative reconstruction technique. COMPARISON:  Head MRI 04/17/2022 FINDINGS: Brain: A known 2 cm cystic metastasis is again seen in the left parietal lobe. Extensive vasogenic edema throughout the left cerebral hemisphere has mildly worsened with increased rightward  midline shift, now measuring 10 mm (previously 8 mm). There is partial effacement of the left lateral ventricle. There is no evidence of intracapsular trapping. Mild edema associated with a metastasis in the posterior right temporal lobe has also likely mildly increased without associated mass effect. There is an approximately 2 cm lesion in the right cerebellar hemisphere without frank enlargement compared to the prior MRI, however prominent right cerebellar edema has likely mildly increased with increased mass effect on the fourth ventricle and mild mass effect on the right dorsal pons. Minimal left cerebellar edema has not significantly changed. No acute cortical infarct, intracranial hemorrhage, or extra-axial fluid collection is evident. Vascular: Calcified atherosclerosis at the skull base. No hyperdense vessel. Skull: No acute fracture or suspicious osseous lesion. Sinuses/Orbits: Visualized paranasal sinuses and mastoid air cells are clear. Were its  are unremarkable. Other: None. IMPRESSION: 1. Mildly increased brain edema associated with known metastases, including extensive edema throughout the left cerebral hemisphere with increased rightward midline shift (now 10 mm). 2. Mildly increased right cerebellar edema with partial effacement of the fourth ventricle. No obstructive hydrocephalus. Electronically Signed   By: Logan Bores M.D.   On: 05/08/2022 17:51   DG Chest 2 View  Result Date: 05/08/2022 CLINICAL DATA:  Cough and wheezing. EXAM: CHEST - 2 VIEW COMPARISON:  04/06/2022, chest radiographs and chest CT. FINDINGS: Patchy opacity noted at the left lung base or spine Stu the airspace opacities noted on the previous day's CT. Remainder of the lungs is clear. Cardiac silhouette is normal in size. No mediastinal masses. Left hilar prominence corresponds to the mass noted on the previous day's CT. Normal right hilar contours. No pleural effusion or pneumothorax. Skeletal structures are intact. IMPRESSION: 1. Left hilar mass and patchy opacities at the left lung base, the latter finding suspected to be postobstructive pneumonitis. Overall, no change from the previous day's chest CT. Electronically Signed   By: Lajean Manes M.D.   On: 05/08/2022 17:09   MR Brain W Wo Contrast  Result Date: 04/18/2022 CLINICAL DATA:  Brain metastases, assess treatment response EXAM: MRI HEAD WITHOUT AND WITH CONTRAST TECHNIQUE: Multiplanar, multiecho pulse sequences of the brain and surrounding structures were obtained without and with intravenous contrast. CONTRAST:  5.67mL GADAVIST GADOBUTROL 1 MMOL/ML IV SOLN COMPARISON:  01/03/2022 FINDINGS: BRAIN New Lesions: 1. Ring-enhancing 18 mm mass in the right cerebellum on 1100:136, with edema. 2. 4 mm in the low left cerebellum on image 122 3. Cortical right occipital temporal junction medially measuring 4 mm on image 172 4. Superior right temporal cortex measuring 4 mm on image 186 5. Punctate at the right basal ganglia  on image 192 6. Punctate along the lateral right frontal cortex on image 226 7. 4 mm along the high right frontal cortex on image 250 8. 5 mm along the high left frontal cortex on image 257 9. 3 mm along the left occipital cortex on image 190 10. Punctate lesion at the left temporal occipital junction on image 196 11. Second left temporal occipital cortex lesion on the same image measuring 2 mm. Larger lesions: 1. Cystic mass centered in the left parietal lobe shows thicker and more irregular solid type enhancement superiorly and inferiorly, upper solid component measuring 2.5 cm on axial images as opposed to 14 mm previously, measurement on image 241 2. Stellate left occipital lesion has thicker enhancement at its upper margin, length measuring 14 mm on axial slices. Stable or Smaller lesions: 1. Punctate along the parasagittal anterior right  frontal lobe on 232 2. Punctate enhancement in the superior right frontal parietal cortex on 260, decreased Other Brain findings: Multifocal vasogenic edema, most exuberant in the right cerebellum and around the left parietal cystic lesions. Associated midline shift towards the right measures 7 mm. No infarct, hydrocephalus, or collection. Vascular: Major flow voids and vascular enhancements are preserved Skull and upper cervical spine: No focal marrow lesion. Sinuses/Orbits: Negative IMPRESSION: 1. Eleven interval enhancing lesions as catalogued above. The largest is in the right cerebellum at 18 mm with moderate vasogenic edema. 2. Pre-existing lesions in the left occipital and parietal lobes show increased enhancement and edema. Midline shift measures 7 mm. 3. Two pre-existing lesions are stable or smaller. Electronically Signed   By: Jorje Guild M.D.   On: 04/18/2022 09:56      Subjective: Patient seen and examined at bedside today but hemodynamically stable.  Right upper extremity weakness has improved today and she is able to lift the right upper extremity.   Medically stable for discharge home.  I called the daughter and discussed about dc planning  Discharge Exam: Vitals:   05/11/22 2017 05/12/22 0612  BP: 116/81 139/80  Pulse: 88 74  Resp: 18 18  Temp: 98.8 F (37.1 C) 98 F (36.7 C)  SpO2: 100% 100%   Vitals:   05/11/22 0623 05/11/22 1315 05/11/22 2017 05/12/22 0612  BP: 134/81 113/75 116/81 139/80  Pulse: 87 80 88 74  Resp: 17 20 18 18   Temp: 97.9 F (36.6 C) 98 F (36.7 C) 98.8 F (37.1 C) 98 F (36.7 C)  TempSrc: Oral Oral Oral Oral  SpO2: 98% 100% 100% 100%  Weight:      Height:        General: Pt is alert, awake, not in acute distress Cardiovascular: RRR, S1/S2 +, no rubs, no gallops Respiratory: CTA bilaterally, no wheezing, no rhonchi Abdominal: Soft, NT, ND, bowel sounds + Extremities: no edema, no cyanosis,weakness on right side    The results of significant diagnostics from this hospitalization (including imaging, microbiology, ancillary and laboratory) are listed below for reference.     Microbiology: No results found for this or any previous visit (from the past 240 hour(s)).   Labs: BNP (last 3 results) No results for input(s): "BNP" in the last 8760 hours. Basic Metabolic Panel: Recent Labs  Lab 05/08/22 1643 05/08/22 1733 05/09/22 0021 05/12/22 0626  NA 137  --  136 134*  K 2.8*  --  5.0 4.1  CL 108  --  106 102  CO2 24  --  20* 20*  GLUCOSE 106*  --  177* 197*  BUN 11  --  10 11  CREATININE 0.68  --  0.63 0.71  CALCIUM 7.7*  --  8.5* 8.9  MG  --  1.8  --   --    Liver Function Tests: Recent Labs  Lab 05/08/22 1643  AST 14*  ALT 18  ALKPHOS 56  BILITOT 0.8  PROT 6.1*  ALBUMIN 3.0*   No results for input(s): "LIPASE", "AMYLASE" in the last 168 hours. No results for input(s): "AMMONIA" in the last 168 hours. CBC: Recent Labs  Lab 05/08/22 1643  WBC 10.0  NEUTROABS 5.5  HGB 12.3  HCT 39.6  MCV 91.0  PLT 445*   Cardiac Enzymes: No results for input(s): "CKTOTAL",  "CKMB", "CKMBINDEX", "TROPONINI" in the last 168 hours. BNP: Invalid input(s): "POCBNP" CBG: Recent Labs  Lab 05/11/22 0808 05/11/22 1149 05/11/22 1619 05/11/22 2147 05/12/22 4496  GLUCAP 105* 134* 201* 239* 171*   D-Dimer No results for input(s): "DDIMER" in the last 72 hours. Hgb A1c No results for input(s): "HGBA1C" in the last 72 hours. Lipid Profile No results for input(s): "CHOL", "HDL", "LDLCALC", "TRIG", "CHOLHDL", "LDLDIRECT" in the last 72 hours. Thyroid function studies No results for input(s): "TSH", "T4TOTAL", "T3FREE", "THYROIDAB" in the last 72 hours.  Invalid input(s): "FREET3" Anemia work up No results for input(s): "VITAMINB12", "FOLATE", "FERRITIN", "TIBC", "IRON", "RETICCTPCT" in the last 72 hours. Urinalysis    Component Value Date/Time   COLORURINE YELLOW 05/08/2022 2203   APPEARANCEUR CLEAR 05/08/2022 2203   LABSPEC 1.013 05/08/2022 2203   PHURINE 6.0 05/08/2022 2203   GLUCOSEU NEGATIVE 05/08/2022 2203   HGBUR NEGATIVE 05/08/2022 2203   BILIRUBINUR NEGATIVE 05/08/2022 2203   KETONESUR NEGATIVE 05/08/2022 2203   PROTEINUR NEGATIVE 05/08/2022 2203   NITRITE NEGATIVE 05/08/2022 2203   LEUKOCYTESUR NEGATIVE 05/08/2022 2203   Sepsis Labs Recent Labs  Lab 05/08/22 1643  WBC 10.0   Microbiology No results found for this or any previous visit (from the past 240 hour(s)).  Please note: You were cared for by a hospitalist during your hospital stay. Once you are discharged, your primary care physician will handle any further medical issues. Please note that NO REFILLS for any discharge medications will be authorized once you are discharged, as it is imperative that you return to your primary care physician (or establish a relationship with a primary care physician if you do not have one) for your post hospital discharge needs so that they can reassess your need for medications and monitor your lab values.    Time coordinating discharge: 40  minutes  SIGNED:   Shelly Coss, MD  [facility] 05/12/2022, 9:25 AM Pager [phone]  If 7PM-7AM, please contact night-coverage [email or web address] Password [email or web address]

## 2022-05-13 ENCOUNTER — Other Ambulatory Visit: Payer: Self-pay

## 2022-05-13 ENCOUNTER — Ambulatory Visit: Payer: 59

## 2022-05-13 ENCOUNTER — Telehealth: Payer: Self-pay | Admitting: Medical Oncology

## 2022-05-13 DIAGNOSIS — R531 Weakness: Secondary | ICD-10-CM | POA: Diagnosis not present

## 2022-05-13 DIAGNOSIS — C3490 Malignant neoplasm of unspecified part of unspecified bronchus or lung: Secondary | ICD-10-CM

## 2022-05-13 DIAGNOSIS — G893 Neoplasm related pain (acute) (chronic): Secondary | ICD-10-CM

## 2022-05-13 DIAGNOSIS — C7931 Secondary malignant neoplasm of brain: Secondary | ICD-10-CM

## 2022-05-13 DIAGNOSIS — Z515 Encounter for palliative care: Secondary | ICD-10-CM

## 2022-05-13 DIAGNOSIS — E440 Moderate protein-calorie malnutrition: Secondary | ICD-10-CM | POA: Diagnosis not present

## 2022-05-13 MED ORDER — OXYCODONE HCL 5 MG PO TABS: 5.0000 mg | ORAL_TABLET | Freq: Four times a day (QID) | ORAL | 0 refills | Status: DC | PRN

## 2022-05-13 NOTE — Telephone Encounter (Signed)
Requests pain med. Transferred call to Palliative Care.

## 2022-05-13 NOTE — Telephone Encounter (Signed)
Pt daughter called for medication refill, will send to preferred pharmacy per Lexine Baton, NP

## 2022-05-14 ENCOUNTER — Ambulatory Visit: Payer: 59

## 2022-05-17 ENCOUNTER — Other Ambulatory Visit: Payer: Self-pay | Admitting: Radiology

## 2022-05-17 ENCOUNTER — Telehealth: Payer: Self-pay | Admitting: Radiation Therapy

## 2022-05-17 ENCOUNTER — Ambulatory Visit: Payer: 59

## 2022-05-17 DIAGNOSIS — C7931 Secondary malignant neoplasm of brain: Secondary | ICD-10-CM

## 2022-05-17 MED ORDER — DEXAMETHASONE 2 MG PO TABS: ORAL_TABLET | ORAL | 0 refills | Status: AC

## 2022-05-17 NOTE — Telephone Encounter (Signed)
I attempted to reach [name], her daughter, and her husband using the phone numbers available. There was no answer or ability to leave a message with either resource. I will attempt again in order to share the steroid taper instructions written for her today.   Mont Dutton R.T.(R)(T) Radiation Special Procedures Navigator

## 2022-05-18 ENCOUNTER — Inpatient Hospital Stay: Payer: 59

## 2022-05-18 ENCOUNTER — Ambulatory Visit
Admission: RE | Admit: 2022-05-18 | Discharge: 2022-05-18 | Disposition: A | Discharge status: Home / Self Care | Payer: 59 | Source: Ambulatory Visit | Attending: Radiation Oncology | Admitting: Radiation Oncology

## 2022-05-18 ENCOUNTER — Other Ambulatory Visit: Payer: Self-pay

## 2022-05-18 ENCOUNTER — Inpatient Hospital Stay: Payer: 59 | Admitting: Nurse Practitioner

## 2022-05-18 ENCOUNTER — Encounter: Payer: Self-pay | Admitting: Radiation Therapy

## 2022-05-18 DIAGNOSIS — Z510 Encounter for antineoplastic radiation therapy: Secondary | ICD-10-CM | POA: Diagnosis not present

## 2022-05-18 DIAGNOSIS — C3490 Malignant neoplasm of unspecified part of unspecified bronchus or lung: Secondary | ICD-10-CM | POA: Diagnosis not present

## 2022-05-18 DIAGNOSIS — C7931 Secondary malignant neoplasm of brain: Secondary | ICD-10-CM | POA: Diagnosis not present

## 2022-05-18 LAB — RAD ONC ARIA SESSION SUMMARY
Course Elapsed Days: 15
Plan Fractions Treated to Date: 7
Plan Prescribed Dose Per Fraction: 3 Gy
Plan Total Fractions Prescribed: 10
Plan Total Prescribed Dose: 30 Gy
Reference Point Dosage Given to Date: 21 Gy
Reference Point Session Dosage Given: 3 Gy
Session Number: 7

## 2022-05-18 NOTE — Progress Notes (Signed)
Pt came in for her whole brain treatment 2/20, I printed out the updated steroid RX taper and explained that this should be at her [facility] to pick up and begin today. Ashauna was thankful for the information.   Mont Dutton R.T.(R)(T) Radiation Special Procedures Navigator   Medication dexamethasone (DECADRON) 2 MG tablet [2326] dexamethasone (DECADRON) 2 MG tablet [[id number]]   Order Details Dose, Route, Frequency: As Directed  Dispense Quantity: 53 tablet Refills: 0        Sig: Take 2 tablets (4 mg total) by mouth 2 (two) times daily for 7 days, THEN 1 tablet (2 mg total) 2 (two) times daily for 7 days, THEN 1 tablet (2 mg total) daily for 7 days, THEN 1 tablet (2 mg total) every other day for 7 days.       Start Date: 05/17/22 End Date: 06/14/22 after 39 doses  Written Date: 05/17/22 Expiration Date: 05/17/23     Associated Diagnoses: Metastasis to brain [facility]) [C79.31]  Providers  Ordering and Authorizing Provider: Marlynn Perking, PA-C [place], [place] [address] Phone: [phone]   Fax: [phone] DEA #: [id number]   NPI: [id number]      Ordering User: Marlynn Perking, [place], [place] - [place] [id number] [place] [address] [place] [address] Phone: [phone]  Fax: [phone] DEA #: --  DAW Reason: --

## 2022-05-18 NOTE — Progress Notes (Deleted)
Pt arrived to radiation but was not arrived to palliative care, will r/s

## 2022-05-18 NOTE — Progress Notes (Signed)
  Radiation Oncology         [phone] ________________________________  Name: Sandra Horne  NTI:[id number]  Date of Service: 05/18/22  DOB: February 14, 1956   Steroid Taper Instructions   You currently have a prescription for Dexamethasone 4 mg Tablets.   Beginning 05/17/22  Take a 2 tablets (which is 4 mg) twice a day  Beginning 05/24/22: Take 1 tablet (which is 2 mg) twice a day  Beginning 05/31/22: Take 1 tablet (which is 2 mg) once a day  Beginning 06/07/22: Take 1 tablet (which is 2 mg) every other day and stop on 06/14/22.   Please call our office if you have any headaches, visual changes, uncontrolled movements, extremity weakness, nausea or vomiting.

## 2022-05-19 ENCOUNTER — Other Ambulatory Visit: Payer: Self-pay

## 2022-05-19 ENCOUNTER — Inpatient Hospital Stay: Payer: 59

## 2022-05-19 ENCOUNTER — Ambulatory Visit: Payer: 59

## 2022-05-19 ENCOUNTER — Ambulatory Visit
Admission: RE | Admit: 2022-05-19 | Discharge: 2022-05-19 | Disposition: A | Discharge status: Home / Self Care | Payer: 59 | Source: Ambulatory Visit | Attending: Radiation Oncology | Admitting: Radiation Oncology

## 2022-05-19 DIAGNOSIS — C7931 Secondary malignant neoplasm of brain: Secondary | ICD-10-CM | POA: Diagnosis not present

## 2022-05-19 DIAGNOSIS — C3490 Malignant neoplasm of unspecified part of unspecified bronchus or lung: Secondary | ICD-10-CM | POA: Diagnosis not present

## 2022-05-19 DIAGNOSIS — Z510 Encounter for antineoplastic radiation therapy: Secondary | ICD-10-CM | POA: Diagnosis not present

## 2022-05-19 LAB — RAD ONC ARIA SESSION SUMMARY
Course Elapsed Days: 16
Plan Fractions Treated to Date: 8
Plan Prescribed Dose Per Fraction: 3 Gy
Plan Total Fractions Prescribed: 10
Plan Total Prescribed Dose: 30 Gy
Reference Point Dosage Given to Date: 24 Gy
Reference Point Session Dosage Given: 3 Gy
Session Number: 8

## 2022-05-20 ENCOUNTER — Ambulatory Visit: Payer: 59

## 2022-05-21 ENCOUNTER — Ambulatory Visit: Payer: 59

## 2022-05-21 ENCOUNTER — Telehealth: Payer: Self-pay | Admitting: Radiation Therapy

## 2022-05-21 NOTE — Telephone Encounter (Signed)
I called pt to check on her when she did not show up for her radiation appointment today. I asked her what caused her to miss the visit, she replied she is "trying to get everything in order". I had given her a print out with steroid taper instructions earlier in the week, she has not started this yet stating that she has not been to the pharmacy to pick up the new Rx. I encouraged her to go ahead and start that taper as soon as possible. I explained that taking steroids longer than recommended can cause other symptoms and problems for her. She expressed understanding and said that she will come for her next scheduled treatment on Monday 2/26.   Mont Dutton R.T.(R)(T) Radiation Special Procedures Navigator

## 2022-05-23 ENCOUNTER — Encounter (HOSPITAL_COMMUNITY): Payer: Self-pay | Admitting: Emergency Medicine

## 2022-05-23 ENCOUNTER — Emergency Department (HOSPITAL_COMMUNITY): Payer: 59

## 2022-05-23 ENCOUNTER — Other Ambulatory Visit: Payer: Self-pay

## 2022-05-23 ENCOUNTER — Inpatient Hospital Stay (HOSPITAL_COMMUNITY): Payer: 59

## 2022-05-23 ENCOUNTER — Inpatient Hospital Stay (HOSPITAL_COMMUNITY)
Admission: EM | Admit: 2022-05-23 | Discharge: 2022-05-26 | DRG: 054 | Disposition: A | Discharge status: Home Health | Payer: 59 | Attending: Internal Medicine | Admitting: Internal Medicine

## 2022-05-23 DIAGNOSIS — F17210 Nicotine dependence, cigarettes, uncomplicated: Secondary | ICD-10-CM | POA: Diagnosis not present

## 2022-05-23 DIAGNOSIS — Z823 Family history of stroke: Secondary | ICD-10-CM | POA: Diagnosis not present

## 2022-05-23 DIAGNOSIS — G936 Cerebral edema: Secondary | ICD-10-CM | POA: Diagnosis not present

## 2022-05-23 DIAGNOSIS — G8191 Hemiplegia, unspecified affecting right dominant side: Secondary | ICD-10-CM | POA: Diagnosis not present

## 2022-05-23 DIAGNOSIS — C3492 Malignant neoplasm of unspecified part of left bronchus or lung: Secondary | ICD-10-CM | POA: Diagnosis present

## 2022-05-23 DIAGNOSIS — Z515 Encounter for palliative care: Secondary | ICD-10-CM

## 2022-05-23 DIAGNOSIS — C7931 Secondary malignant neoplasm of brain: Principal | ICD-10-CM

## 2022-05-23 DIAGNOSIS — I629 Nontraumatic intracranial hemorrhage, unspecified: Secondary | ICD-10-CM | POA: Diagnosis not present

## 2022-05-23 DIAGNOSIS — Z7189 Other specified counseling: Secondary | ICD-10-CM | POA: Diagnosis not present

## 2022-05-23 DIAGNOSIS — C3490 Malignant neoplasm of unspecified part of unspecified bronchus or lung: Secondary | ICD-10-CM | POA: Diagnosis not present

## 2022-05-23 DIAGNOSIS — Z510 Encounter for antineoplastic radiation therapy: Secondary | ICD-10-CM | POA: Diagnosis not present

## 2022-05-23 DIAGNOSIS — C3432 Malignant neoplasm of lower lobe, left bronchus or lung: Secondary | ICD-10-CM | POA: Diagnosis not present

## 2022-05-23 DIAGNOSIS — Z96641 Presence of right artificial hip joint: Secondary | ICD-10-CM | POA: Diagnosis not present

## 2022-05-23 DIAGNOSIS — R531 Weakness: Secondary | ICD-10-CM | POA: Diagnosis not present

## 2022-05-23 DIAGNOSIS — G893 Neoplasm related pain (acute) (chronic): Secondary | ICD-10-CM

## 2022-05-23 DIAGNOSIS — S06360A Traumatic hemorrhage of cerebrum, unspecified, without loss of consciousness, initial encounter: Secondary | ICD-10-CM | POA: Diagnosis not present

## 2022-05-23 DIAGNOSIS — G935 Compression of brain: Secondary | ICD-10-CM | POA: Diagnosis present

## 2022-05-23 DIAGNOSIS — Z7982 Long term (current) use of aspirin: Secondary | ICD-10-CM

## 2022-05-23 DIAGNOSIS — Z87891 Personal history of nicotine dependence: Secondary | ICD-10-CM | POA: Diagnosis not present

## 2022-05-23 DIAGNOSIS — Z809 Family history of malignant neoplasm, unspecified: Secondary | ICD-10-CM | POA: Diagnosis not present

## 2022-05-23 DIAGNOSIS — Z79899 Other long term (current) drug therapy: Secondary | ICD-10-CM

## 2022-05-23 DIAGNOSIS — Z8249 Family history of ischemic heart disease and other diseases of the circulatory system: Secondary | ICD-10-CM | POA: Diagnosis not present

## 2022-05-23 LAB — CBC WITH DIFFERENTIAL/PLATELET
Abs Immature Granulocytes: 0.13 10*3/uL — ABNORMAL HIGH (ref 0.00–0.07)
Basophils Absolute: 0.1 10*3/uL (ref 0.0–0.1)
Basophils Relative: 1 %
Eosinophils Absolute: 0.1 10*3/uL (ref 0.0–0.5)
Eosinophils Relative: 1 %
HCT: 40 % (ref 36.0–46.0)
Hemoglobin: 12.8 g/dL (ref 12.0–15.0)
Immature Granulocytes: 1 %
Lymphocytes Relative: 7 %
Lymphs Abs: 0.7 10*3/uL (ref 0.7–4.0)
MCH: 28.8 pg (ref 26.0–34.0)
MCHC: 32 g/dL (ref 30.0–36.0)
MCV: 89.9 fL (ref 80.0–100.0)
Monocytes Absolute: 0.6 10*3/uL (ref 0.1–1.0)
Monocytes Relative: 6 %
Neutro Abs: 7.8 10*3/uL — ABNORMAL HIGH (ref 1.7–7.7)
Neutrophils Relative %: 84 %
Platelets: 342 10*3/uL (ref 150–400)
RBC: 4.45 MIL/uL (ref 3.87–5.11)
RDW: 15.1 % (ref 11.5–15.5)
WBC: 9.3 10*3/uL (ref 4.0–10.5)
nRBC: 0 % (ref 0.0–0.2)

## 2022-05-23 LAB — COMPREHENSIVE METABOLIC PANEL
ALT: 26 U/L (ref 0–44)
AST: 18 U/L (ref 15–41)
Albumin: 3.2 g/dL — ABNORMAL LOW (ref 3.5–5.0)
Alkaline Phosphatase: 69 U/L (ref 38–126)
Anion gap: 10 (ref 5–15)
BUN: 9 mg/dL (ref 8–23)
CO2: 25 mmol/L (ref 22–32)
Calcium: 8.7 mg/dL — ABNORMAL LOW (ref 8.9–10.3)
Chloride: 105 mmol/L (ref 98–111)
Creatinine, Ser: 0.7 mg/dL (ref 0.44–1.00)
GFR, Estimated: >60 mL/min (ref >60)
Glucose, Bld: 122 mg/dL — ABNORMAL HIGH (ref 70–99)
Potassium: 3.8 mmol/L (ref 3.5–5.1)
Sodium: 140 mmol/L (ref 135–145)
Total Bilirubin: 1.2 mg/dL (ref 0.3–1.2)
Total Protein: 6.5 g/dL (ref 6.5–8.1)

## 2022-05-23 LAB — PROTIME-INR
INR: 1 (ref 0.8–1.2)
Prothrombin Time: 13.2 seconds (ref 11.4–15.2)

## 2022-05-23 LAB — APTT: aPTT: 27 seconds (ref 24–36)

## 2022-05-23 MED ORDER — ONDANSETRON HCL 4 MG PO TABS: 4.0000 mg | ORAL_TABLET | Freq: Four times a day (QID) | ORAL | Status: DC | PRN

## 2022-05-23 MED ORDER — GABAPENTIN 300 MG PO CAPS: 300.0000 mg | ORAL_CAPSULE | Freq: Three times a day (TID) | ORAL | Status: DC | PRN

## 2022-05-23 MED ORDER — ENSURE ENLIVE PO LIQD
237.0000 mL | Freq: Two times a day (BID) | ORAL | Status: DC
  Administered 2022-05-24 – 2022-05-26 (×5): 237 mL via ORAL
  Filled 2022-05-23 (×2): qty 237

## 2022-05-23 MED ORDER — ONDANSETRON HCL 4 MG/2ML IJ SOLN: 4.0000 mg | Freq: Four times a day (QID) | INTRAMUSCULAR | Status: DC | PRN

## 2022-05-23 MED ORDER — OXYCODONE HCL 5 MG PO TABS
5.0000 mg | ORAL_TABLET | Freq: Four times a day (QID) | ORAL | Status: DC | PRN
  Administered 2022-05-24 (×2): 5 mg via ORAL
  Filled 2022-05-23 (×2): qty 1

## 2022-05-23 MED ORDER — ACETAMINOPHEN 650 MG RE SUPP: 650.0000 mg | Freq: Four times a day (QID) | RECTAL | Status: DC | PRN

## 2022-05-23 MED ORDER — DEXAMETHASONE 4 MG PO TABS
4.0000 mg | ORAL_TABLET | Freq: Two times a day (BID) | ORAL | Status: DC
  Administered 2022-05-23 – 2022-05-26 (×6): 4 mg via ORAL
  Filled 2022-05-23 (×6): qty 1

## 2022-05-23 MED ORDER — ACETAMINOPHEN 325 MG PO TABS: 650.0000 mg | ORAL_TABLET | Freq: Four times a day (QID) | ORAL | Status: DC | PRN

## 2022-05-23 MED ORDER — SENNA 8.6 MG PO TABS
1.0000 | ORAL_TABLET | Freq: Two times a day (BID) | ORAL | Status: DC
  Administered 2022-05-23 – 2022-05-26 (×6): 8.6 mg via ORAL
  Filled 2022-05-23 (×6): qty 1

## 2022-05-23 MED ORDER — POLYETHYLENE GLYCOL 3350 17 G PO PACK: 17.0000 g | PACK | Freq: Every day | ORAL | Status: DC | PRN

## 2022-05-23 MED ORDER — LEVETIRACETAM 500 MG PO TABS
500.0000 mg | ORAL_TABLET | Freq: Two times a day (BID) | ORAL | Status: DC
  Administered 2022-05-23 – 2022-05-26 (×6): 500 mg via ORAL
  Filled 2022-05-23 (×6): qty 1

## 2022-05-23 MED ORDER — POLYETHYLENE GLYCOL 3350 17 G PO PACK
17.0000 g | PACK | Freq: Two times a day (BID) | ORAL | Status: DC
  Administered 2022-05-23 – 2022-05-24 (×2): 17 g via ORAL
  Filled 2022-05-23 (×3): qty 1

## 2022-05-23 MED ORDER — POTASSIUM CHLORIDE IN NACL 20-0.9 MEQ/L-% IV SOLN
INTRAVENOUS | Status: AC
  Filled 2022-05-23: qty 1000

## 2022-05-23 NOTE — Assessment & Plan Note (Addendum)
CT findings stated above.  Per recent discharge summary, except for the new hemorrhage, most of the CT findings are not new, had MRI brain that showed same metastatic lesions.  Currently on chemo and radiation, hospice approach was recommended.  Per notes, and on my evaluation today, patient still wants to continue full scope of treatment and elects to be full code.  Dose of IV dexamethasone was transiently increased during hospitalization, in the hopes of improving her hemiparesis. -Continue Keppra - Per Rad-onc- she is currently on slow dexamethasone taper, she reports compliance- she is currently on 4 mg twice daily.  Will continue with steroid taper. -Poor oral intake- hydrate- N/s + 20 kcl 100cc/hr x 15hrs

## 2022-05-23 NOTE — ED Notes (Signed)
Ensure provided

## 2022-05-23 NOTE — ED Triage Notes (Signed)
Patient c/o right sided weakness x 2 months. Per family patient currently "being treated with chemo for brain cancer." Last chemo treatment last Monday. Patient not eating per family. Denies any nausea, vomiting, diarrhea or fevers. Patient unable to "walk straight or stand long" due to the right side weakness. Denies any headache, dizziness, slurred speech, facial drooping, or difficulty swallowing. Denies having any head CT done after weakness started.

## 2022-05-23 NOTE — Assessment & Plan Note (Signed)
Presenting with chronic right-sided weakness of 2 months duration.  Head CT today- - new hemorrhage- posterior left occipital lobe metastatic lesion. Extensive vasogenic edema in the left cerebral hemisphere centered around a cystic metastatic lesion in the posterior left frontal lobe, and rightward midline shift measuring up to 1.0 cm. Both unchanged. Previously seen intracranial metastases on brain MRI dated 04/17/22. - EDP talked to neurosurg- Dr. Shelba FlakeSaintclair Halsted)- admit to [facility], repeat CT in a.m.

## 2022-05-23 NOTE — ED Notes (Signed)
Attempted report to 3W. 

## 2022-05-23 NOTE — H&P (Signed)
History and Physical    Sandra Horne [id number] DOB: [birth year] DOA: 05/23/2022  PCP: Pcp, No   Patient coming from: Home  I have personally briefly reviewed patient's old medical records in [place]  Chief Complaint: Right Sided weakness  HPI: Sandra Horne is a 67 y.o. female with medical history significant for lung cancer with metastasis to brain. Patient presented to the ED with complaints of right-sided weakness of 2 months duration.  She is barely able to walk or stand.  Weakness has been persistent and unchanged in severity.  She reports mild chronic flattening of right nasolabial fold, otherwise no headaches, no change in vision, no slurred speech, difficulty swallowing, she is constipated from pain medications otherwise no change in bowel or urinary habits. Reports chronic poor oral intake, no cough no difficulty breathing, no fevers.  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 documented that she has has residual right-sided weakness from brain mets.  Presented with increased weakness from baseline.  Patient MRI showed likely new metastatic lesions, was on radiation treatment but now was  recommended hospice approach.   CT head showed mildly increased brain edema with increased rightward midline shift and mild increased right cerebral edema. She elected to remain full code, and to continue full scope of treatment.   ED Course: Stable vitals.  Chest x-ray shows unchanged appearance of left hilar mass/malignancy, left lower lobe opacities likely representing postobstructive pneumonitis. Head CT Wo contrast-new hemorrhage in posterior left occipital lobe metastatic lesion, extensive vasogenic edema and rightward midline shift measuring up to 1 cm both of these unchanged.   Also previously seen intracranial metastasis. EDP neurosurgery on-call- Dr. Meyran(Cram)recommended admission to [facility], repeat CT in 24  hours  Review of Systems: As per HPI all other systems reviewed and negative.  Past Medical History:  Diagnosis Date   Cancer ([place])    Lung and Brain mets    Past Surgical History:  Procedure Laterality Date   BRONCHIAL BRUSHINGS  03/26/2021   Procedure: BRONCHIAL BRUSHINGS;  Surgeon: Margaretha Seeds, MD;  Location: [facility]COPY;  Service: Cardiopulmonary;;   BRONCHIAL NEEDLE ASPIRATION BIOPSY  03/26/2021   Procedure: BRONCHIAL NEEDLE ASPIRATION BIOPSIES;  Surgeon: Margaretha Seeds, MD;  Location: [facility];  Service: Cardiopulmonary;;   BRONCHIAL WASHINGS  03/26/2021   Procedure: BRONCHIAL WASHINGS;  Surgeon: Margaretha Seeds, MD;  Location: [facility];  Service: Cardiopulmonary;;   ECTOPIC PREGNANCY SURGERY     ENDOBRONCHIAL ULTRASOUND Bilateral 03/26/2021   Procedure: ENDOBRONCHIAL ULTRASOUND;  Surgeon: Margaretha Seeds, MD;  Location: [facility];  Service: Cardiopulmonary;  Laterality: Bilateral;   HEMOSTASIS CONTROL  03/26/2021   Procedure: HEMOSTASIS CONTROL;  Surgeon: Margaretha Seeds, MD;  Location: [facility];  Service: Cardiopulmonary;;   HIP ARTHROPLASTY Right 10/13/2021   Procedure: ARTHROPLASTY BIPOLAR HIP (HEMIARTHROPLASTY);  Surgeon: Carole Civil, MD;  Location: [facility];  Service: Orthopedics;  Laterality: Right;     reports that she has been smoking cigarettes. She has a 10.00 pack-year smoking history. She has never used smokeless tobacco. She reports that she does not currently use alcohol. She reports that she does not currently use drugs after having used the following drugs: Marijuana.  No Known Allergies  Family History  Problem Relation Age of Onset   Hypertension Mother    Stroke Mother    Hypertension Father    Cancer Sister    Cancer Paternal Uncle     Prior  to Admission medications   Medication Sig Start Date End Date Taking? Authorizing Provider  aspirin EC 81 MG tablet Take 81 mg by mouth daily. Swallow whole.   Yes [provider]  dexamethasone (DECADRON) 2 MG tablet Take 2 tablets (4 mg total) by mouth 2 (two) times daily for 7 days, THEN 1 tablet (2 mg total) 2 (two) times daily for 7 days, THEN 1 tablet (2 mg total) daily for 7 days, THEN 1 tablet (2 mg total) every other day for 7 days. 05/17/22 06/14/22 Yes Marlynn Perking, PA-C  gabapentin (NEURONTIN) 300 MG capsule Take 1 capsule (300 mg total) by mouth 3 (three) times daily. Patient taking differently: Take 300 mg by mouth 3 (three) times daily as needed (for pain). 01/13/22  Yes Carole Civil, MD  ibuprofen (ADVIL) 800 MG tablet Take 1 tablet (800 mg total) by mouth every 8 (eight) hours as needed. Patient taking differently: Take 800 mg by mouth every 8 (eight) hours as needed for mild pain or headache. 01/13/22  Yes Carole Civil, MD  levETIRAcetam (KEPPRA) 500 MG tablet Take 1 tablet (500 mg total) by mouth 2 (two) times daily. 05/12/22 08/10/22 Yes Shelly Coss, MD  naproxen (NAPROSYN) 375 MG tablet Take 1 tablet (375 mg total) by mouth every 12 (twelve) hours as needed. 05/12/22  Yes Shelly Coss, MD  oxyCODONE (ROXICODONE) 5 MG immediate release tablet Take 1 tablet (5 mg total) by mouth every 6 (six) hours as needed for breakthrough pain. 05/13/22  Yes Pickenpack-Cousar, Carlena Sax, NP  pantoprazole (PROTONIX) 40 MG tablet Take 1 tablet (40 mg total) by mouth daily. 05/12/22  Yes Shelly Coss, MD  polyethylene glycol (MIRALAX / GLYCOLAX) 17 g packet Take 17 g by mouth daily. 05/12/22  Yes Shelly Coss, MD  senna (SENOKOT) 8.6 MG TABS tablet Take 1 tablet (8.6 mg total) by mouth 2 (two) times daily for 14 days. 05/12/22 05/26/22 Yes Adhikari, Tamsen Meek, MD  lidocaine (LIDODERM) 5 % Place 1 patch onto the skin daily. Remove & Discard patch within 12 hours or as directed by MD Patient not taking: Reported on 05/23/2022 05/12/22   Shelly Coss, MD  methocarbamol (ROBAXIN) 500 MG tablet Take 1 tablet (500 mg total) by mouth 3 (three) times  daily. Patient not taking: Reported on 05/23/2022 01/13/22   Carole Civil, MD  Nicotine (NICODERM CQ TD) Place 1 patch onto the skin daily as needed (for smoking cessation while hospitalized). Patient not taking: Reported on 05/23/2022    [provider]    Physical Exam: Vitals:   05/23/22 1202 05/23/22 1206 05/23/22 1337 05/23/22 1500  BP: 94/77  93/62 115/71  Pulse: 98  83 81  Resp: 18     Temp: 98.2 F (36.8 C)     TempSrc: Oral     SpO2: 99%  97% 98%  Weight:  65.8 kg    Height:  '5\' 6"'$  (1.676 m)      Constitutional: NAD, calm, comfortable Vitals:   05/23/22 1202 05/23/22 1206 05/23/22 1337 05/23/22 1500  BP: 94/77  93/62 115/71  Pulse: 98  83 81  Resp: 18     Temp: 98.2 F (36.8 C)     TempSrc: Oral     SpO2: 99%  97% 98%  Weight:  65.8 kg    Height:  '5\' 6"'$  (1.676 m)     Eyes: PERRL, lids and conjunctivae normal ENMT: Mucous membranes are dry   Neck: normal, supple, no masses,  no thyromegaly Respiratory: clear to auscultation bilaterally, no wheezing, no crackles. Normal respiratory effort. No accessory muscle use.  Cardiovascular: Regular rate and rhythm, no murmurs / rubs / gallops. No extremity edema.  Extremities warm Abdomen: no tenderness, no masses palpated. No hepatosplenomegaly. Bowel sounds positive.  Musculoskeletal: no clubbing / cyanosis. No joint deformity upper and lower extremities.  Skin: no rashes, lesions, ulcers. No induration Neurologic: Speech clear and fluent without evidence of aphasia, flattening of right nasolabial fold on passive exam, that disappears when smiling, moderate grip strength to right upper extremity compared to left- strong grip strength, 4/5 strength right lower extremity, 5/5 strength left lower extremity.  Sensation intact. Psychiatric: Normal judgment and insight. Alert and oriented x 3. Normal mood.   Labs on Admission: I have personally reviewed following labs and imaging studies  CBC: Recent Labs  Lab  05/23/22 1405  WBC 9.3  NEUTROABS 7.8*  HGB 12.8  HCT 40.0  MCV 89.9  PLT [id number]   Basic Metabolic Panel: Recent Labs  Lab 05/23/22 1405  NA 140  K 3.8  CL 105  CO2 25  GLUCOSE 122*  BUN 9  CREATININE 0.70  CALCIUM 8.7*   Liver Function Tests: Recent Labs  Lab 05/23/22 1405  AST 18  ALT 26  ALKPHOS 69  BILITOT 1.2  PROT 6.5  ALBUMIN 3.2*   Coagulation Profile: Recent Labs  Lab 05/23/22 1405  INR 1.0    Radiological Exams on Admission: CT Head Wo Contrast  Result Date: 05/23/2022 CLINICAL DATA:  TIA EXAM: CT HEAD WITHOUT CONTRAST TECHNIQUE: Contiguous axial images were obtained from the base of the skull through the vertex without intravenous contrast. RADIATION DOSE REDUCTION: This exam was performed according to the departmental dose-optimization program which includes automated exposure control, adjustment of the mA and/or kV according to patient size and/or use of iterative reconstruction technique. COMPARISON:  CT head 05/08/22, MRI Brain 04/17/22 FINDINGS: Brain: Redemonstrated is extensive vasogenic edema in the left cerebral hemisphere which appears centered around a cystic metastatic lesion in the posterior left frontal lobe measuring approximately 1.6 x 1.6 cm, unchanged from prior. The degree of rightward midline shift is also unchanged measuring up to 1.0 cm. Compared to prior exam there is new hemorrhage in the posterior left temporal lobe (series 2, image 14). Additional vision of the loss of present in the right cerebellar hemisphere the right frontal lobe, in the periatrial white matter on the right, all of which are suspicious for regions of intracranial metastases. Size and shape of the ventricular system is unchanged from prior exam. Vascular: No hyperdense vessel or unexpected calcification. Skull: Normal. Negative for fracture or focal lesion. Sinuses/Orbits: No mastoid effusion. Paranasal sinuses are clear. Orbits are unremarkable. Other: None. IMPRESSION:  1. New hemorrhage in the posterior left occipital lobe metastatic lesion. 2. Extensive vasogenic edema in the left cerebral hemisphere centered around a cystic metastatic lesion in the posterior left frontal lobe, unchanged from prior exam. Unchanged rightward midline shift measuring up to 1.0 cm. 3. Additional previously seen intracranial metastases on brain MRI dated 04/17/22 are poorly visualized due to CT technique. No definite new metastases. Electronically Signed   By: Marin Roberts M.D.   On: 05/23/2022 15:03   DG Chest Portable 1 View  Result Date: 05/23/2022 CLINICAL DATA:  Weakness. Patient with lung cancer and brain metastases. EXAM: PORTABLE CHEST 1 VIEW COMPARISON:  05/08/2022 and prior studies FINDINGS: LEFT hilar mass and LEFT LOWER lung opacities are not significantly changed. The  cardiomediastinal silhouette is otherwise unremarkable. No new pulmonary opacities are present. There is no evidence of new airspace disease, pleural effusion or pneumothorax. No acute bony abnormalities are noted. IMPRESSION: Unchanged appearance of the chest with LEFT hilar mass/malignancy and LEFT LOWER lung opacities likely representing postobstructive pneumonitis. Electronically Signed   By: Margarette Canada M.D.   On: 05/23/2022 14:33    EKG: None  Assessment/Plan Principal Problem:   Intracranial hemorrhage (HCC) Active Problems:   Lung cancer metastatic to brain (HCC)   Cerebral edema (HCC)   Assessment and Plan: * Intracranial hemorrhage ([place]) Presenting with chronic right-sided weakness of 2 months duration.  Head CT today- - new hemorrhage- posterior left occipital lobe metastatic lesion. Extensive vasogenic edema in the left cerebral hemisphere centered around a cystic metastatic lesion in the posterior left frontal lobe, and rightward midline shift measuring up to 1.0 cm. Both unchanged. Previously seen intracranial metastases on brain MRI dated 04/17/22. - EDP talked to neurosurg- Dr. Shelba FlakeSaintclair Halsted)- admit to [facility], repeat CT in a.m.   Lung cancer metastatic to brain [facility]) CT findings stated above.  Per recent discharge summary, except for the new hemorrhage, most of the CT findings are not new, had MRI brain that showed same metastatic lesions.  Currently on chemo and radiation, hospice approach was recommended.  Per notes, and on my evaluation today, patient still wants to continue full scope of treatment and elects to be full code.  Dose of IV dexamethasone was transiently increased during hospitalization, in the hopes of improving her hemiparesis. -Continue Keppra - Per Rad-onc- she is currently on slow dexamethasone taper, she reports compliance- she is currently on 4 mg twice daily.  Will continue with steroid taper. -Poor oral intake- hydrate- N/s + 20 kcl 100cc/hr x 15hrs    DVT prophylaxis: SCDS Code Status: Full code, confirmed with patient and spouse at bedside. Family Communication:  Spouse at bedside Disposition Plan: ~ 2 days Consults called: Neurosurg Admission status:  Obs Stepdown   Author: Bethena Roys, MD 05/23/2022 5:58 PM  For on call review [email or web address].

## 2022-05-23 NOTE — ED Notes (Signed)
Pt verbalized to nurse " when she goes for her chemotherapy treatment her oncologist said her weakness was normal with her condition". Family at bedside shook his head " no".

## 2022-05-23 NOTE — ED Provider Notes (Signed)
[facility] Provider Note   CSN: [id number] Arrival date & time: 05/23/22  1116     History  Chief Complaint  Patient presents with   Extremity Weakness    Sandra Horne is a 67 y.o. female.  67 year old female with past medical history significant for brain tumor undergoing active chemo treatment as well as being treated with steroids presents today for evaluation of 19-monthduration of right sided weakness including in the right upper extremity and right lower extremity.  Patient denies any change in symptoms over the past 2 months.  They present because the symptoms have persisted.  Have not undergone eval until today.  Denies headache, vision change, balance issues.  Patient states she has 2 chemo sessions left.  The history is provided by the patient. No language interpreter was used.       Home Medications Prior to Admission medications   Medication Sig Start Date End Date Taking? Authorizing Provider  aspirin EC 81 MG tablet Take 81 mg by mouth daily. Swallow whole.    [provider]  dexamethasone (DECADRON) 2 MG tablet Take 2 tablets (4 mg total) by mouth 2 (two) times daily for 7 days, THEN 1 tablet (2 mg total) 2 (two) times daily for 7 days, THEN 1 tablet (2 mg total) daily for 7 days, THEN 1 tablet (2 mg total) every other day for 7 days. 05/17/22 06/14/22  MMarlynn Perking PA-C  gabapentin (NEURONTIN) 300 MG capsule Take 1 capsule (300 mg total) by mouth 3 (three) times daily. Patient taking differently: Take 300 mg by mouth 3 (three) times daily as needed (for pain). 01/13/22   HCarole Civil MD  ibuprofen (ADVIL) 800 MG tablet Take 1 tablet (800 mg total) by mouth every 8 (eight) hours as needed. Patient taking differently: Take 800 mg by mouth every 8 (eight) hours as needed for mild pain or headache. 01/13/22   HCarole Civil MD  levETIRAcetam (KEPPRA) 500 MG tablet Take 1 tablet (500 mg total) by  mouth 2 (two) times daily. 05/12/22 08/10/22  AShelly Coss MD  lidocaine (LIDODERM) 5 % Place 1 patch onto the skin daily. Remove & Discard patch within 12 hours or as directed by MD 05/12/22   AShelly Coss MD  methocarbamol (ROBAXIN) 500 MG tablet Take 1 tablet (500 mg total) by mouth 3 (three) times daily. Patient taking differently: Take 500 mg by mouth every 8 (eight) hours as needed for muscle spasms. 01/13/22   HCarole Civil MD  naproxen (NAPROSYN) 375 MG tablet Take 1 tablet (375 mg total) by mouth every 12 (twelve) hours as needed. 05/12/22   AShelly Coss MD  Nicotine (NICODERM CQ TD) Place 1 patch onto the skin daily as needed (for smoking cessation while hospitalized).    [provider]  oxyCODONE (ROXICODONE) 5 MG immediate release tablet Take 1 tablet (5 mg total) by mouth every 6 (six) hours as needed for breakthrough pain. 05/13/22   Pickenpack-Cousar, ACarlena Sax NP  pantoprazole (PROTONIX) 40 MG tablet Take 1 tablet (40 mg total) by mouth daily. 05/12/22   AShelly Coss MD  polyethylene glycol (MIRALAX / GLYCOLAX) 17 g packet Take 17 g by mouth daily. 05/12/22   AShelly Coss MD  senna (SENOKOT) 8.6 MG TABS tablet Take 1 tablet (8.6 mg total) by mouth 2 (two) times daily for 14 days. 05/12/22 05/26/22  AShelly Coss MD      Allergies    Patient  has no known allergies.    Review of Systems   Review of Systems  Constitutional:  Negative for chills and fever.  Eyes:  Negative for visual disturbance.  Respiratory:  Negative for shortness of breath.   Cardiovascular:  Negative for chest pain.  Gastrointestinal:  Negative for nausea.  Neurological:  Positive for weakness. Negative for light-headedness and headaches.  All other systems reviewed and are negative.   Physical Exam Updated Vital Signs BP 93/62   Pulse 83   Temp 98.2 F (36.8 C) (Oral)   Resp 18   Ht '5\' 6"'$  (1.676 m)   Wt 65.8 kg   SpO2 97%   BMI 23.40 kg/m  Physical Exam Vitals  and nursing note reviewed.  Constitutional:      General: She is not in acute distress.    Appearance: Normal appearance. She is not ill-appearing.  HENT:     Head: Normocephalic and atraumatic.     Nose: Nose normal.  Eyes:     General: No scleral icterus.    Extraocular Movements: Extraocular movements intact.     Conjunctiva/sclera: Conjunctivae normal.  Cardiovascular:     Rate and Rhythm: Normal rate and regular rhythm.     Pulses: Normal pulses.  Pulmonary:     Effort: Pulmonary effort is normal. No respiratory distress.     Breath sounds: Normal breath sounds. No wheezing or rales.  Abdominal:     General: There is no distension.     Tenderness: There is no abdominal tenderness.  Musculoskeletal:        General: Normal range of motion.     Cervical back: Normal range of motion.  Skin:    General: Skin is warm and dry.  Neurological:     General: No focal deficit present.     Mental Status: She is alert and oriented to person, place, and time. Mental status is at baseline.     Comments: Right upper extremity and right lower extremity weaker compared to the contralateral side.  Does have good range of motion.  However range of motion is slower in the right extremities.  Cranial nerves III through XII intact.  Without facial droop or dysarthria.     ED Results / Procedures / Treatments   Labs (all labs ordered are listed, but only abnormal results are displayed) Labs Reviewed  CBC WITH DIFFERENTIAL/PLATELET  COMPREHENSIVE METABOLIC PANEL  URINALYSIS, ROUTINE W REFLEX MICROSCOPIC  APTT  PROTIME-INR    EKG None  Radiology No results found.  Procedures Procedures    Medications Ordered in ED Medications - No data to display  ED Course/ Medical Decision Making/ A&P                             Medical Decision Making Amount and/or Complexity of Data Reviewed Labs: ordered. Radiology: ordered.  Risk Decision regarding hospitalization.   Medical  Decision Making / ED Course   This patient presents to the ED for concern of right-sided weakness, this involves an extensive number of treatment options, and is a complaint that carries with it a high risk of complications and morbidity.  The differential diagnosis includes metastatic lesion, CVA, TIA  MDM: 67 year old female with past medical history significant for lung cancer, with metastasis to the brain who presents today for evaluation of right-sided weakness.  This has been ongoing now for couple months.  Recently admitted for the same.  She states she is here  because her symptoms are ongoing.  Denies any change in symptoms.  On exam she does have pinpoint pupils.  Cranial nerves otherwise intact.  CT head without contrast demonstrates previously known metastatic lesion to the left hemisphere as well as a new hemorrhage in the posterior left occipital lobe.  Case discussed with neurosurgery who recommends admitting patient to [facility] and they will follow along.  They recommend repeat CT scan in about 24 hours to reevaluate.  Discussed with patient and son and they are in agreement.  Will discuss with hospitalist.  CBC unremarkable.  CMP without acute concerns.  Chest x-ray without acute cardiopulmonary process.   Additional history obtained: -Additional history obtained from recent admission, recent oncology clinic visit -External records from outside source obtained and reviewed including: Chart review including previous notes, labs, imaging, consultation notes   Lab Tests: -I ordered, reviewed, and interpreted labs.   The pertinent results include:   Labs Reviewed  CBC WITH DIFFERENTIAL/PLATELET - Abnormal; Notable for the following components:      Result Value   Neutro Abs 7.8 (*)    Abs Immature Granulocytes 0.13 (*)    All other components within normal limits  COMPREHENSIVE METABOLIC PANEL - Abnormal; Notable for the following components:   Glucose, Bld 122 (*)    Calcium  8.7 (*)    Albumin 3.2 (*)    All other components within normal limits  APTT  PROTIME-INR  URINALYSIS, ROUTINE W REFLEX MICROSCOPIC      EKG  EKG Interpretation  Date/Time:    Ventricular Rate:    PR Interval:    QRS Duration:   QT Interval:    QTC Calculation:   R Axis:     Text Interpretation:           Imaging Studies ordered: I ordered imaging studies including CT head without contrast, chest x-ray I independently visualized and interpreted imaging. I agree with the radiologist interpretation   Medicines ordered and prescription drug management: No orders of the defined types were placed in this encounter.   -I have reviewed the patients home medicines and have made adjustments as needed   Reevaluation: After the interventions noted above, I reevaluated the patient and found that they have :stayed the same  Co morbidities that complicate the patient evaluation  Past Medical History:  Diagnosis Date   Cancer ([place])    Lung and Brain mets      Dispostion: Hospitalist will evaluate patient for admission.   Final Clinical Impression(s) / ED Diagnoses Final diagnoses:  Intracranial bleed [facility])    Rx / DC Orders ED Discharge Orders     None         Evlyn Courier, PA-C 05/23/22 1747    Milton Ferguson, MD 05/25/22 [id number]

## 2022-05-24 ENCOUNTER — Other Ambulatory Visit: Payer: Self-pay

## 2022-05-24 ENCOUNTER — Ambulatory Visit
Admission: RE | Admit: 2022-05-24 | Discharge: 2022-05-24 | Disposition: A | Discharge status: Home / Self Care | Payer: 59 | Source: Ambulatory Visit | Attending: Radiation Oncology | Admitting: Radiation Oncology

## 2022-05-24 ENCOUNTER — Inpatient Hospital Stay (HOSPITAL_COMMUNITY): Payer: 59

## 2022-05-24 ENCOUNTER — Ambulatory Visit: Payer: 59

## 2022-05-24 DIAGNOSIS — I629 Nontraumatic intracranial hemorrhage, unspecified: Secondary | ICD-10-CM | POA: Diagnosis not present

## 2022-05-24 DIAGNOSIS — C3490 Malignant neoplasm of unspecified part of unspecified bronchus or lung: Secondary | ICD-10-CM | POA: Diagnosis not present

## 2022-05-24 DIAGNOSIS — C7931 Secondary malignant neoplasm of brain: Secondary | ICD-10-CM | POA: Diagnosis not present

## 2022-05-24 LAB — BASIC METABOLIC PANEL
Anion gap: 11 (ref 5–15)
BUN: 8 mg/dL (ref 8–23)
CO2: 21 mmol/L — ABNORMAL LOW (ref 22–32)
Calcium: 8.6 mg/dL — ABNORMAL LOW (ref 8.9–10.3)
Chloride: 106 mmol/L (ref 98–111)
Creatinine, Ser: 0.72 mg/dL (ref 0.44–1.00)
GFR, Estimated: >60 mL/min (ref >60)
Glucose, Bld: 116 mg/dL — ABNORMAL HIGH (ref 70–99)
Potassium: 4.2 mmol/L (ref 3.5–5.1)
Sodium: 138 mmol/L (ref 135–145)

## 2022-05-24 LAB — RAD ONC ARIA SESSION SUMMARY
Course Elapsed Days: 21
Plan Fractions Treated to Date: 9
Plan Prescribed Dose Per Fraction: 3 Gy
Plan Total Fractions Prescribed: 10
Plan Total Prescribed Dose: 30 Gy
Reference Point Dosage Given to Date: 27 Gy
Reference Point Session Dosage Given: 3 Gy
Session Number: 9

## 2022-05-24 LAB — CBC
HCT: 38.1 % (ref 36.0–46.0)
Hemoglobin: 12.1 g/dL (ref 12.0–15.0)
MCH: 28.7 pg (ref 26.0–34.0)
MCHC: 31.8 g/dL (ref 30.0–36.0)
MCV: 90.3 fL (ref 80.0–100.0)
Platelets: 320 10*3/uL (ref 150–400)
RBC: 4.22 MIL/uL (ref 3.87–5.11)
RDW: 14.7 % (ref 11.5–15.5)
WBC: 9.3 10*3/uL (ref 4.0–10.5)
nRBC: 0 % (ref 0.0–0.2)

## 2022-05-24 NOTE — Progress Notes (Signed)
Pt is transferring to [facility] where she receives her radiation treatments. Pt will pending palliative consult.  TOC following.

## 2022-05-24 NOTE — Progress Notes (Signed)
PT Cancellation Note  Patient Details Name: Sandra Horne MRN: [id number] DOB: [birth year]   Cancelled Treatment:    Reason Eval/Treat Not Completed: Other (comment) (pt tranfserring to [facility]). Will defer PT eval to [facility].   Leighton Roach, PT  Acute Rehab Services Secure chat preferred Office [place] 05/24/2022, 12:12 PM

## 2022-05-24 NOTE — Progress Notes (Signed)
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  On presentation, chest x-ray showed unchanged appearance of left hilar mass/malignancy.  CT of the head without contrast showed new hemorrhage in posterior left occipital lobe metastatic lesion, extensive vasogenic edema and rightward midline shift measuring up to 1 cm both of these unchanged along with previously seen intracranial metastasis.  EDP spoke to neurosurgery on-call who recommended transfer to [facility] and repeat CT scan in 24 hours.  Assessment & Plan:   Intracranial hemorrhage Right-sided weakness -Presented with ongoing weakness, patient currently barely able to walk or stand -CT head as above.  Neurosurgery has reviewed the CAT scan and is of the opinion that bleeding is clinically insignificant and recommended conservative management.  Repeat CT of the head without contrast showed no significant interval change since the CT from 1 day prior -PT/OT eval.  Fall precautions.  Non-small cell lung cancer with metastasis to brain Goals of care -Recent MRI had shown metastatic brain lesions.  Currently on radiation treatment and is scheduled to get radiation today and tomorrow at [facility].  Will transfer the patient to [facility] long hospital.  Patient still wants to continue with radiation  treatment. -Overall prognosis is guarded to poor: Hospice was recently recommended for her but she decided to remain full code and continue radiation treatment.  Palliative care had also evaluated her during recent admission.  I will reconsult palliative care for goals of care discussion. -I have discussed via secure chat with Dr. Julien Nordmann and Dr. Tammi Klippel who concurred that patient would benefit from hospice but radiation treatment will be continued since patient is still wanting to continue it. -Will continue oral Decadron for now. -Continue Keppra -Dr. Julien Nordmann would be happy to see her inpatient with the patient gets to [facility] and talk about options of immunotherapy.   DVT prophylaxis: SCDs Code Status: Full Family Communication: None at bedside Disposition Plan: Status is: Inpatient Remains inpatient appropriate because: Of severity of illness  Consultants: Neurosurgery.  Palliative care.  Secure chatted with Dr. Mohamed/Dr. Tammi Klippel  Procedures: None  Antimicrobials: None   Subjective: Patient seen and examined at bedside.  Denies any current headache, nausea, vomiting.  Slow to respond.  Poor historian.  Still complains of right-sided weakness.  Objective: Vitals:   05/23/22 2326 05/24/22 0022 05/24/22 0322 05/24/22 0807  BP: 119/65 109/86 135/77 113/80  Pulse: 85 81 61 63  Resp: '16  14 15  '$ Temp:  98.2 F (36.8 C) 97.9 F (36.6 C) 98.1 F (36.7 C)  TempSrc:  Oral Oral Oral  SpO2: 97% 100% 100% 100%  Weight:      Height:        Intake/Output Summary (Last 24 hours) at 05/24/2022 1103 Last data filed at 05/24/2022 0415 Gross per 24 hour  Intake 450 ml  Output --  Net 450 ml   Filed Weights   05/23/22 1206  Weight: 65.8 kg  Examination:  General exam: Appears calm and comfortable.  Looks chronically ill and deconditioned.  On room air. Respiratory system: Bilateral decreased breath sounds at bases with some scattered crackles Cardiovascular system: S1 &  S2 heard, Rate controlled Gastrointestinal system: Abdomen is nondistended, soft and nontender. Normal bowel sounds heard. Extremities: No cyanosis, clubbing; trace lower extremity edema present Central nervous system: Alert and oriented.  Right-sided weakness present.   Skin: No rashes, lesions or ulcers Psychiatry: Flat affect.  Not agitated.    Data Reviewed: I have personally reviewed following labs and imaging studies  CBC: Recent Labs  Lab 05/23/22 1405 05/24/22 0356  WBC 9.3 9.3  NEUTROABS 7.8*  --   HGB 12.8 12.1  HCT 40.0 38.1  MCV 89.9 90.3  PLT 342 [id number]   Basic Metabolic Panel: Recent Labs  Lab 05/23/22 1405 05/24/22 0356  NA 140 138  K 3.8 4.2  CL 105 106  CO2 25 21*  GLUCOSE 122* 116*  BUN 9 8  CREATININE 0.70 0.72  CALCIUM 8.7* 8.6*   GFR: Estimated Creatinine Clearance: 65.6 mL/min (by C-G formula based on SCr of 0.72 mg/dL). Liver Function Tests: Recent Labs  Lab 05/23/22 1405  AST 18  ALT 26  ALKPHOS 69  BILITOT 1.2  PROT 6.5  ALBUMIN 3.2*   No results for input(s): "LIPASE", "AMYLASE" in the last 168 hours. No results for input(s): "AMMONIA" in the last 168 hours. Coagulation Profile: Recent Labs  Lab 05/23/22 1405  INR 1.0   Cardiac Enzymes: No results for input(s): "CKTOTAL", "CKMB", "CKMBINDEX", "TROPONINI" in the last 168 hours. BNP (last 3 results) No results for input(s): "PROBNP" in the last 8760 hours. HbA1C: No results for input(s): "HGBA1C" in the last 72 hours. CBG: No results for input(s): "GLUCAP" in the last 168 hours. Lipid Profile: No results for input(s): "CHOL", "HDL", "LDLCALC", "TRIG", "CHOLHDL", "LDLDIRECT" in the last 72 hours. Thyroid Function Tests: No results for input(s): "TSH", "T4TOTAL", "FREET4", "T3FREE", "THYROIDAB" in the last 72 hours. Anemia Panel: No results for input(s): "VITAMINB12", "FOLATE", "FERRITIN", "TIBC", "IRON", "RETICCTPCT" in the last 72 hours. Sepsis Labs: No results for  input(s): "PROCALCITON", "LATICACIDVEN" in the last 168 hours.  No results found for this or any previous visit (from the past 240 hour(s)).       Radiology Studies: CT HEAD WO CONTRAST (5MM)  Result Date: 05/24/2022 CLINICAL DATA:  Follow-up brain metastases EXAM: CT HEAD WITHOUT CONTRAST TECHNIQUE: Contiguous axial images were obtained from the base of the skull through the vertex without intravenous contrast. RADIATION DOSE REDUCTION: This exam was performed according to the departmental dose-optimization program which includes automated exposure control, adjustment of the mA and/or kV according to patient size and/or use of iterative reconstruction technique. COMPARISON:  CT head 1 day prior, brain MRI 04/17/2022 FINDINGS: Brain: The 1.8 cm x 1.4 cm metastatic lesion in the left frontal is unchanged, with unchanged extensive surrounding edema. The small focus of hemorrhage in the left temporooccipital region measuring approximately 9 mm with surrounding edema is unchanged, likely related to additional metastatic disease. There is significant mass effect with partial effacement of the right lateral ventricle and 1.0 cm rightward midline shift. The dominant right cerebellar metastatic lesion is grossly similar, with unchanged surrounding edema and partial effacement of fourth ventricle. There is no evidence of developing hydrocephalus. There is no new acute intracranial hemorrhage, extra-axial fluid collection, or acute territorial infarct. Vascular: No hyperdense vessel or unexpected calcification. Skull: Normal. Negative for fracture or focal lesion.  Sinuses/Orbits: The imaged paranasal sinuses are clear. The globes and orbits are unremarkable. Other: None. IMPRESSION: 1. No significant interval change since the CT from 1 day prior. 2. Unchanged small focus of hemorrhage in the left temporooccipital region. 3. Unchanged left frontal metastatic lesion with extensive surrounding edema and 1.0 cm  leftward midline shift. 4. Unchanged right cerebellar lesion with surrounding edema. Additional lesions seen on the prior brain MRI are not well seen on the current noncontrast CT. No evidence of developing hydrocephalus. Electronically Signed   By: Valetta Mole M.D.   On: 05/24/2022 09:08   CT Head Wo Contrast  Result Date: 05/23/2022 CLINICAL DATA:  TIA EXAM: CT HEAD WITHOUT CONTRAST TECHNIQUE: Contiguous axial images were obtained from the base of the skull through the vertex without intravenous contrast. RADIATION DOSE REDUCTION: This exam was performed according to the departmental dose-optimization program which includes automated exposure control, adjustment of the mA and/or kV according to patient size and/or use of iterative reconstruction technique. COMPARISON:  CT head 05/08/22, MRI Brain 04/17/22 FINDINGS: Brain: Redemonstrated is extensive vasogenic edema in the left cerebral hemisphere which appears centered around a cystic metastatic lesion in the posterior left frontal lobe measuring approximately 1.6 x 1.6 cm, unchanged from prior. The degree of rightward midline shift is also unchanged measuring up to 1.0 cm. Compared to prior exam there is new hemorrhage in the posterior left temporal lobe (series 2, image 14). Additional vision of the loss of present in the right cerebellar hemisphere the right frontal lobe, in the periatrial white matter on the right, all of which are suspicious for regions of intracranial metastases. Size and shape of the ventricular system is unchanged from prior exam. Vascular: No hyperdense vessel or unexpected calcification. Skull: Normal. Negative for fracture or focal lesion. Sinuses/Orbits: No mastoid effusion. Paranasal sinuses are clear. Orbits are unremarkable. Other: None. IMPRESSION: 1. New hemorrhage in the posterior left occipital lobe metastatic lesion. 2. Extensive vasogenic edema in the left cerebral hemisphere centered around a cystic metastatic lesion in  the posterior left frontal lobe, unchanged from prior exam. Unchanged rightward midline shift measuring up to 1.0 cm. 3. Additional previously seen intracranial metastases on brain MRI dated 04/17/22 are poorly visualized due to CT technique. No definite new metastases. Electronically Signed   By: Marin Roberts M.D.   On: 05/23/2022 15:03   DG Chest Portable 1 View  Result Date: 05/23/2022 CLINICAL DATA:  Weakness. Patient with lung cancer and brain metastases. EXAM: PORTABLE CHEST 1 VIEW COMPARISON:  05/08/2022 and prior studies FINDINGS: LEFT hilar mass and LEFT LOWER lung opacities are not significantly changed. The cardiomediastinal silhouette is otherwise unremarkable. No new pulmonary opacities are present. There is no evidence of new airspace disease, pleural effusion or pneumothorax. No acute bony abnormalities are noted. IMPRESSION: Unchanged appearance of the chest with LEFT hilar mass/malignancy and LEFT LOWER lung opacities likely representing postobstructive pneumonitis. Electronically Signed   By: Margarette Canada M.D.   On: 05/23/2022 14:33        Scheduled Meds:  dexamethasone  4 mg Oral Q12H   feeding supplement  237 mL Oral BID BM   levETIRAcetam  500 mg Oral BID   polyethylene glycol  17 g Oral BID   senna  1 tablet Oral BID   Continuous Infusions:  0.9 % NaCl with KCl 20 mEq / L Stopped (05/24/22 0415)          Aline August, MD [facility] 05/24/2022, 11:03 AM

## 2022-05-24 NOTE — Progress Notes (Signed)
Patient ID: Sandra Horne, female   DOB: 23-Jan-1956, 67 y.o.   MRN: [id number] Reviewed CT scan from last night and MRI scan from the end of June very small amount of clinically insignificant blood left occipital in the setting of patient with multiple brain metastasis.  I do not see any role for neurosurgery here recommend contacting medical oncology radiation oncology to ensure maximizing those modalities.

## 2022-05-24 NOTE — Progress Notes (Signed)
Report called to [facility] RN on [place] [facility]ology. Scheduled for PTAR pick up, radiation appt at 1400 then will go to 6E. All questions answered. Bedside RN updated.

## 2022-05-24 NOTE — Evaluation (Signed)
Occupational Therapy Evaluation Patient Details Name: Sandra Horne MRN: [id number] DOB: 07-04-1955 Today's Date: 05/24/2022   History of Present Illness Patient is a 67 year old female who presented to the hospital with a 2 month history of R sided weakness with decreased ability to stand. 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 Currently, CT head showed mildly increased brain edema with increased rightward midline shift and mild increased right cerebral edema.MRI revealed likely new metastatic lesions. Patient was admitted with intracranial hemorrhage.CT from 2/26 revealed no significant change from prior CT. PMH: lung cancer mets to brain.   Clinical Impression   Patient is a 67 year old female who was admitted for above. Patient was living at home with support from husband and daughter who patient report work during the day. Patient reported no falls since d/c from hospital earlier this month. Patient was noted to have decreased functional activity tolernace, decreased ROM, decreased BUE strength, decreased endurance, decreased sitting balance, decreased standing balanced, decreased safety awareness, and decreased knowledge of AE/AD impacting participation in ADLs. Patient will need 24/7 caregiver support to be successful in the next level of care. Patient would continue to benefit from skilled OT services at this time while admitted and after d/c to address noted deficits in order to improve overall safety and independence in ADLs.          Recommendations for follow up therapy are one component of a multi-disciplinary discharge planning process, led by the attending physician.  Recommendations may be updated based on patient status, additional functional criteria and insurance authorization.   Follow Up Recommendations  Skilled nursing-short term rehab (<3 hours/day)      Assistance Recommended at Discharge Frequent or constant Supervision/Assistance  Patient can return home with the following A lot of help with walking and/or transfers;A lot of help with bathing/dressing/bathroom;Assistance with cooking/housework;Direct supervision/assist for medications management;Assist for transportation;Direct supervision/assist for financial management;Help with stairs or ramp for entrance    Functional Status Assessment  Patient has had a recent decline in their functional status and demonstrates the ability to make significant improvements in function in a reasonable and predictable amount of time.  Equipment Recommendations  Other (comment) (defer to next venue)       Precautions / Restrictions Precautions Precautions: Fall Precaution Comments: Right-sided hemiparesis Restrictions Weight Bearing Restrictions: No      Mobility Bed Mobility Overal bed mobility: Needs Assistance Bed Mobility: Supine to Sit     Supine to sit: Min assist, HOB elevated     General bed mobility comments: assist to raise trunk, VCs for technique          Balance Overall balance assessment: Needs assistance Sitting-balance support: No upper extremity supported, Feet supported Sitting balance-Leahy Scale: Fair     Standing balance support: Bilateral upper extremity supported, Reliant on assistive device for balance Standing balance-Leahy Scale: Poor       ADL either performed or assessed with clinical judgement   ADL Overall ADL's : Needs assistance/impaired Eating/Feeding: Set up;Sitting Eating/Feeding Details (indicate cue type and reason): in recliner with patient noted to use fingers for self feeding v.s. utensils. Grooming: Sitting;Minimal assistance;Oral care;Wash/dry face Grooming Details (indicate cue type and reason): sitting EOB. patient did not unscrew cap on toothpaste and attempted to squeeze out toothpaste x2. patient noted to have poor control of  squeeze with large amount of toothpaste added to toothbrush. patient  needed initation cues for tasks. Upper Body Bathing: Sitting;Moderate assistance   Lower Body Bathing: Bed level;Maximal assistance   Upper Body Dressing : Moderate assistance;Bed level   Lower Body Dressing: Maximal assistance;Bed level   Toilet Transfer: Minimal assistance;Ambulation;Rolling walker (2 wheels) Toilet Transfer Details (indicate cue type and reason): to transfer from edge of bed to recliner in room with RW. patient needed physical A not to plop into chair even with cues. Toileting- Clothing Manipulation and Hygiene: Maximal assistance;Sit to/from stand       Functional mobility during ADLs: Minimal assistance;Rolling walker (2 wheels)       Vision Patient Visual Report: No change from baseline Additional Comments: no apparent deficits noted.            Pertinent Vitals/Pain Pain Assessment Pain Assessment: No/denies pain     Hand Dominance Right   Extremity/Trunk Assessment Upper Extremity Assessment Upper Extremity Assessment: RUE deficits/detail RUE Deficits / Details: patient was able to AROM FF to about 75 degrees on this date, continues to have about 15 degrees AROM of elbow. patient was inconsistent with movements UE was able to make with AAROM light touch aboe to Abduct about 75 degrees and FF about 80 degrees.   Lower Extremity Assessment Lower Extremity Assessment: Defer to PT evaluation       Communication Communication Communication: No difficulties   Cognition Arousal/Alertness: Awake/alert Behavior During Therapy: WFL for tasks assessed/performed Overall Cognitive Status: Within Functional Limits for tasks assessed         General Comments: patient continues to have poor safety awareness. this therapist is familiar with patient from last admission. patient is plesant and cooperative during session. continues to have poor insight to deficits. patient was oriented to  self and place. patient was within a day of day of week and date.                Home Living Family/patient expects to be discharged to:: Private residence Living Arrangements: Spouse/significant other;Children Available Help at Discharge: Family;Available PRN/intermittently (husband works during the day) Type of Home: House Home Access: Stairs to enter Technical brewer of Steps: 6 Entrance Stairs-Rails: None Home Layout: One level     Bathroom Shower/Tub: Occupational psychologist: Standard Bathroom Accessibility: Yes   Home Equipment: Conservation officer, nature (2 wheels);BSC/3in1;Wheelchair - manual          Prior Functioning/Environment Prior Level of Function : Independent/Modified Independent             Mobility Comments: patient used RW for mobility. ADLs Comments: husband helps with IADLs per patient report.patient reported husband and daughter work during the day        OT Problem List: Decreased activity tolerance;Impaired balance (sitting and/or standing);Decreased coordination;Decreased knowledge of precautions;Decreased safety awareness;Decreased knowledge of use of DME or AE      OT Treatment/Interventions: Self-care/ADL training;Energy conservation;Therapeutic exercise;DME and/or AE instruction;Therapeutic activities;Patient/family education;Balance training    OT Goals(Current goals can be found in the care plan section) Acute Rehab OT Goals Patient Stated Goal: to get better at sitting in chair OT Goal Formulation: Patient unable to participate in goal setting Time For Goal Achievement: 06/07/22 Potential to Achieve Goals: Fair  OT Frequency: Min 2X/week       AM-PAC OT "6 Clicks" Daily Activity     Outcome Measure Help from another person eating meals?: A Little Help from another person taking care of personal grooming?: A Little Help from another person toileting, which includes using toliet, bedpan,  or urinal?: A Lot Help from another  person bathing (including washing, rinsing, drying)?: A Lot Help from another person to put on and taking off regular upper body clothing?: A Lot Help from another person to put on and taking off regular lower body clothing?: A Lot 6 Click Score: 14   End of Session Equipment Utilized During Treatment: Gait belt;Rolling walker (2 wheels) Nurse Communication: Mobility status  Activity Tolerance: Patient tolerated treatment well Patient left: in chair;with call bell/phone within reach;with chair alarm set  OT Visit Diagnosis: Unsteadiness on feet (R26.81);Other abnormalities of gait and mobility (R26.89);Muscle weakness (generalized) (M62.81)                Time: [id number] OT Time Calculation (min): 16 min Charges:  OT General Charges $OT Visit: 1 Visit OT Evaluation $OT Eval Moderate Complexity: 1 Mod  Viral Schramm OTR/L, MS Acute Rehabilitation Department Office# [phone]   Willa Rough 05/24/2022, 5:30 PM

## 2022-05-24 NOTE — Progress Notes (Incomplete)
The patient is admitted to [address] A & O x 3. Patient and husband are oriented to the staff, ascom and call bell. Full assessment to epic completed. Will continue to monitor.

## 2022-05-25 ENCOUNTER — Other Ambulatory Visit: Payer: Self-pay

## 2022-05-25 ENCOUNTER — Ambulatory Visit
Admission: RE | Admit: 2022-05-25 | Discharge: 2022-05-25 | Disposition: A | Discharge status: Home / Self Care | Payer: 59 | Source: Ambulatory Visit | Attending: Radiation Oncology | Admitting: Radiation Oncology

## 2022-05-25 ENCOUNTER — Encounter: Payer: Self-pay | Admitting: Urology

## 2022-05-25 DIAGNOSIS — C7931 Secondary malignant neoplasm of brain: Secondary | ICD-10-CM | POA: Diagnosis not present

## 2022-05-25 DIAGNOSIS — I629 Nontraumatic intracranial hemorrhage, unspecified: Secondary | ICD-10-CM | POA: Diagnosis not present

## 2022-05-25 DIAGNOSIS — Z7189 Other specified counseling: Secondary | ICD-10-CM

## 2022-05-25 DIAGNOSIS — Z87891 Personal history of nicotine dependence: Secondary | ICD-10-CM | POA: Diagnosis not present

## 2022-05-25 DIAGNOSIS — C3432 Malignant neoplasm of lower lobe, left bronchus or lung: Secondary | ICD-10-CM | POA: Diagnosis not present

## 2022-05-25 DIAGNOSIS — C3490 Malignant neoplasm of unspecified part of unspecified bronchus or lung: Secondary | ICD-10-CM | POA: Diagnosis not present

## 2022-05-25 DIAGNOSIS — Z510 Encounter for antineoplastic radiation therapy: Secondary | ICD-10-CM | POA: Diagnosis not present

## 2022-05-25 LAB — RAD ONC ARIA SESSION SUMMARY
Course Elapsed Days: 22
Plan Fractions Treated to Date: 10
Plan Prescribed Dose Per Fraction: 3 Gy
Plan Total Fractions Prescribed: 10
Plan Total Prescribed Dose: 30 Gy
Reference Point Dosage Given to Date: 30 Gy
Reference Point Session Dosage Given: 3 Gy
Session Number: 10

## 2022-05-25 NOTE — Progress Notes (Signed)
PROGRESS NOTE    Sandra Horne  [id number] DOB: 1955/09/02 DOA: 05/23/2022 PCP: Pcp, No     Brief Narrative:  Sandra Horne is a 67 year old female with history of non-small cell lung cancer with mets to brain resulting in right-sided weakness, currently undergoing radiation treatment, right femoral neck fracture status post hemiarthroplasty in 09/2021, tobacco use, recent admission from 05/08/2022-05/12/2022 for increasing right-sided weakness with MRI of brain showing likely new metastatic lesions. Hospice was recommended but patient wanted to continue radiation and remained full code. She presented 05/23/2022 with right-sided weakness.  On presentation, chest x-ray showed unchanged appearance of left hilar mass/malignancy.  CT of the head without contrast showed new hemorrhage in posterior left occipital lobe metastatic lesion, extensive vasogenic edema and rightward midline shift measuring up to 1 cm both of these unchanged along with previously seen intracranial metastasis.  EDP spoke to neurosurgery on-call who recommended transfer to [facility] and repeat CT scan in 24 hours. She remained stable. She was transferred to [facility] for radiation treatment.   New events last 24 hours / Subjective: Patient without any new complaints today.  Denies any headache.  She is awaiting radiation treatment that is scheduled for this afternoon.  Assessment & Plan:   Principal Problem:   Intracranial hemorrhage (HCC) Active Problems:   Lung cancer metastatic to brain (HCC)   Cerebral edema (HCC)   Intracranial hemorrhage Right-sided weakness Insetting of brain mets -Neurosurgery has revealed imaging and determined that bleeding is clinically nonsignificant, recommended conservative management.  Repeat CT head did not show significant interval change -PT OT recommending SNF placement  Non-small cell lung cancer with brain mets -Transferred to [facility] for  radiation treatment -Overall prognosis has been poor.  Hospice was recommended previously. -Palliative care consulted -Decadron, Keppra  DVT prophylaxis:  SCDs Start: 05/23/22 2026  Code Status: Full code Family Communication: No family at bedside Disposition Plan:  Status is: Inpatient Remains inpatient appropriate because: SNF placement. Radiation treatment. Palliative care consulted   Consultants:  Oncology Radiation oncology Neurosurgery Palliative    Antimicrobials:  Anti-infectives (From admission, onward)    None        Objective: Vitals:   05/24/22 1835 05/24/22 2128 05/25/22 0130 05/25/22 0457  BP: (!) 131/91 129/72 130/83 126/85  Pulse: 77 70 75 67  Resp: '16 18  18  '$ Temp:  98.3 F (36.8 C) 98.1 F (36.7 C) 97.6 F (36.4 C)  TempSrc:  Oral Oral Oral  SpO2: 99% 100% 100% 100%  Weight:      Height:        Intake/Output Summary (Last 24 hours) at 05/25/2022 1030 Last data filed at 05/25/2022 0526 Gross per 24 hour  Intake 360 ml  Output 150 ml  Net 210 ml   Filed Weights   05/23/22 1206  Weight: 65.8 kg    Examination:  General exam: Appears calm and comfortable  Respiratory system: Clear to auscultation. Respiratory effort normal. No respiratory distress. No conversational dyspnea.  Cardiovascular system: S1 & S2 heard, RRR. No murmurs. No pedal edema. Gastrointestinal system: Abdomen is nondistended, soft and nontender. Normal bowel sounds heard. Central nervous system: Alert and oriented.  Extremities: Symmetric in appearance  Skin: No rashes, lesions or ulcers on exposed skin  Psychiatry: Judgement and insight appear normal. Mood & affect appropriate.   Data Reviewed: I have personally reviewed following labs and imaging studies  CBC: Recent Labs  Lab 05/23/22 1405 05/24/22 0356  WBC 9.3  9.3  NEUTROABS 7.8*  --   HGB 12.8 12.1  HCT 40.0 38.1  MCV 89.9 90.3  PLT 342 [id number]   Basic Metabolic Panel: Recent Labs  Lab 05/23/22 1405  05/24/22 0356  NA 140 138  K 3.8 4.2  CL 105 106  CO2 25 21*  GLUCOSE 122* 116*  BUN 9 8  CREATININE 0.70 0.72  CALCIUM 8.7* 8.6*   GFR: Estimated Creatinine Clearance: 65.6 mL/min (by C-G formula based on SCr of 0.72 mg/dL). Liver Function Tests: Recent Labs  Lab 05/23/22 1405  AST 18  ALT 26  ALKPHOS 69  BILITOT 1.2  PROT 6.5  ALBUMIN 3.2*   No results for input(s): "LIPASE", "AMYLASE" in the last 168 hours. No results for input(s): "AMMONIA" in the last 168 hours. Coagulation Profile: Recent Labs  Lab 05/23/22 1405  INR 1.0   Cardiac Enzymes: No results for input(s): "CKTOTAL", "CKMB", "CKMBINDEX", "TROPONINI" in the last 168 hours. BNP (last 3 results) No results for input(s): "PROBNP" in the last 8760 hours. HbA1C: No results for input(s): "HGBA1C" in the last 72 hours. CBG: No results for input(s): "GLUCAP" in the last 168 hours. Lipid Profile: No results for input(s): "CHOL", "HDL", "LDLCALC", "TRIG", "CHOLHDL", "LDLDIRECT" in the last 72 hours. Thyroid Function Tests: No results for input(s): "TSH", "T4TOTAL", "FREET4", "T3FREE", "THYROIDAB" in the last 72 hours. Anemia Panel: No results for input(s): "VITAMINB12", "FOLATE", "FERRITIN", "TIBC", "IRON", "RETICCTPCT" in the last 72 hours. Sepsis Labs: No results for input(s): "PROCALCITON", "LATICACIDVEN" in the last 168 hours.  No results found for this or any previous visit (from the past 240 hour(s)).    Radiology Studies: CT HEAD WO CONTRAST (5MM)  Result Date: 05/24/2022 CLINICAL DATA:  Follow-up brain metastases EXAM: CT HEAD WITHOUT CONTRAST TECHNIQUE: Contiguous axial images were obtained from the base of the skull through the vertex without intravenous contrast. RADIATION DOSE REDUCTION: This exam was performed according to the departmental dose-optimization program which includes automated exposure control, adjustment of the mA and/or kV according to patient size and/or use of iterative  reconstruction technique. COMPARISON:  CT head 1 day prior, brain MRI 04/17/2022 FINDINGS: Brain: The 1.8 cm x 1.4 cm metastatic lesion in the left frontal is unchanged, with unchanged extensive surrounding edema. The small focus of hemorrhage in the left temporooccipital region measuring approximately 9 mm with surrounding edema is unchanged, likely related to additional metastatic disease. There is significant mass effect with partial effacement of the right lateral ventricle and 1.0 cm rightward midline shift. The dominant right cerebellar metastatic lesion is grossly similar, with unchanged surrounding edema and partial effacement of fourth ventricle. There is no evidence of developing hydrocephalus. There is no new acute intracranial hemorrhage, extra-axial fluid collection, or acute territorial infarct. Vascular: No hyperdense vessel or unexpected calcification. Skull: Normal. Negative for fracture or focal lesion. Sinuses/Orbits: The imaged paranasal sinuses are clear. The globes and orbits are unremarkable. Other: None. IMPRESSION: 1. No significant interval change since the CT from 1 day prior. 2. Unchanged small focus of hemorrhage in the left temporooccipital region. 3. Unchanged left frontal metastatic lesion with extensive surrounding edema and 1.0 cm leftward midline shift. 4. Unchanged right cerebellar lesion with surrounding edema. Additional lesions seen on the prior brain MRI are not well seen on the current noncontrast CT. No evidence of developing hydrocephalus. Electronically Signed   By: Valetta Mole M.D.   On: 05/24/2022 09:08   CT Head Wo Contrast  Result Date: 05/23/2022 CLINICAL DATA:  TIA  EXAM: CT HEAD WITHOUT CONTRAST TECHNIQUE: Contiguous axial images were obtained from the base of the skull through the vertex without intravenous contrast. RADIATION DOSE REDUCTION: This exam was performed according to the departmental dose-optimization program which includes automated exposure  control, adjustment of the mA and/or kV according to patient size and/or use of iterative reconstruction technique. COMPARISON:  CT head 05/08/22, MRI Brain 04/17/22 FINDINGS: Brain: Redemonstrated is extensive vasogenic edema in the left cerebral hemisphere which appears centered around a cystic metastatic lesion in the posterior left frontal lobe measuring approximately 1.6 x 1.6 cm, unchanged from prior. The degree of rightward midline shift is also unchanged measuring up to 1.0 cm. Compared to prior exam there is new hemorrhage in the posterior left temporal lobe (series 2, image 14). Additional vision of the loss of present in the right cerebellar hemisphere the right frontal lobe, in the periatrial white matter on the right, all of which are suspicious for regions of intracranial metastases. Size and shape of the ventricular system is unchanged from prior exam. Vascular: No hyperdense vessel or unexpected calcification. Skull: Normal. Negative for fracture or focal lesion. Sinuses/Orbits: No mastoid effusion. Paranasal sinuses are clear. Orbits are unremarkable. Other: None. IMPRESSION: 1. New hemorrhage in the posterior left occipital lobe metastatic lesion. 2. Extensive vasogenic edema in the left cerebral hemisphere centered around a cystic metastatic lesion in the posterior left frontal lobe, unchanged from prior exam. Unchanged rightward midline shift measuring up to 1.0 cm. 3. Additional previously seen intracranial metastases on brain MRI dated 04/17/22 are poorly visualized due to CT technique. No definite new metastases. Electronically Signed   By: Marin Roberts M.D.   On: 05/23/2022 15:03   DG Chest Portable 1 View  Result Date: 05/23/2022 CLINICAL DATA:  Weakness. Patient with lung cancer and brain metastases. EXAM: PORTABLE CHEST 1 VIEW COMPARISON:  05/08/2022 and prior studies FINDINGS: LEFT hilar mass and LEFT LOWER lung opacities are not significantly changed. The cardiomediastinal silhouette  is otherwise unremarkable. No new pulmonary opacities are present. There is no evidence of new airspace disease, pleural effusion or pneumothorax. No acute bony abnormalities are noted. IMPRESSION: Unchanged appearance of the chest with LEFT hilar mass/malignancy and LEFT LOWER lung opacities likely representing postobstructive pneumonitis. Electronically Signed   By: Margarette Canada M.D.   On: 05/23/2022 14:33      Scheduled Meds:  dexamethasone  4 mg Oral Q12H   feeding supplement  237 mL Oral BID BM   levETIRAcetam  500 mg Oral BID   polyethylene glycol  17 g Oral BID   senna  1 tablet Oral BID   Continuous Infusions:   LOS: 2 days   Time spent: 25 minutes   Dessa Phi, DO [facility] 05/25/2022, 10:30 AM   Available via Epic secure chat 7am-7pm After these hours, please refer to coverage provider listed on [email or web address]

## 2022-05-25 NOTE — Consult Note (Signed)
Consultation Note Date: 05/25/2022   Patient Name: Sandra Horne  DOB: 02/23/1956  MRN: [id number]  Age / Sex: 67 y.o., female  PCP: Pcp, No Referring Physician: Dessa Phi, DO  Reason for Consultation:  "goals of care. Very sick and previously recommended hospice"  HPI/Patient Profile: 67 y.o. female  with past medical history of  [place] lung cancer with mets to brain resulting in R side weakness currently on radiation therapy and dexamethasone, R femoral neck fracture s/p hemiarthroplasty admitted on 05/23/2022 with weakness.  MRI brain indicated small ICH deemed clinically insignificant by neurosurgery. Scheduled to have her last radiation treatment today.   Primary Decision Maker PATIENT - and husband  Discussion: Chart reviewed including labs, progress notes, imaging from this and previous encounters.  Ms. Patient was last seen by palliative provider Dr. Chelsea Aus on 05/11/22- she is also followed outpatient at [facility] by Lavone Nian, NP.  Today Ms. Stepanian is awake and alert. Able to tell me she came to hospital because of weakness that has increased since her last hospital admission. She denies pain. She is looking forward to her last radiation treatment and would like to discuss starting chemotherapy.  Her goals of care are to continue her treatments and she is amenable to going to a SNF facility short term.     SUMMARY OF RECOMMENDATIONS -Patient is interested in starting immunotherapy/chemotherapy- recommend Oncology consult -Continue full scope, full code -F/U outpatient with [facility] Palliative provider Lavone Nian, NP    Code Status/Advance Care Planning: Full code   Prognosis:   Unable to determine  Discharge Planning: [place] for rehab with Palliative care service follow-up  Primary Diagnoses: Present on Admission:  Cerebral edema ([place])  Lung cancer metastatic to  brain [facility])   Review of Systems  Physical Exam  Vital Signs: BP 126/85 (BP Location: Right Arm)   Pulse 67   Temp 97.6 F (36.4 C) (Oral)   Resp 18   Ht '5\' 6"'$  (1.676 m)   Wt 65.8 kg   SpO2 100%   BMI 23.40 kg/m  Pain Scale: 0-10 POSS *See Group Information*: 1-Acceptable,Awake and alert Pain Score: 0-No pain   SpO2: SpO2: 100 % O2 Device:SpO2: 100 % O2 Flow Rate: .   IO: Intake/output summary:  Intake/Output Summary (Last 24 hours) at 05/25/2022 1207 Last data filed at 05/25/2022 0526 Gross per 24 hour  Intake 360 ml  Output 150 ml  Net 210 ml    LBM: Last BM Date : 05/24/22 Baseline Weight: Weight: 65.8 kg Most recent weight: Weight: 65.8 kg       Thank you for this consult. Palliative medicine will continue to follow and assist as needed.   Greater than 50%  of this time was spent counseling and coordinating care related to the above assessment and plan.  Signed by: Mariana Kaufman, AGNP-C Palliative Medicine    Please contact Palliative Medicine Team phone at [phone] for questions and concerns.  For individual provider: See Shea Evans

## 2022-05-25 NOTE — Evaluation (Signed)
Physical Therapy Evaluation Patient Details Name: Sandra Horne MRN: [id number] DOB: Nov 21, 1955 Today's Date: 05/25/2022  History of Present Illness  Patient is a 67 year old female who presented to the hospital with a 2 month history of R sided weakness with decreased ability to stand. 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 Currently, CT head showed mildly increased brain edema with increased rightward midline shift and mild increased right cerebral edema.MRI revealed likely new metastatic lesions. Patient was admitted with intracranial hemorrhage.CT from 2/26 revealed no significant change from prior CT. PMH: lung cancer mets to brain.  Clinical Impression  On eval, pt required MIn A for mobility. She walked ~60 feet with a RW. Pt participated well. R LE weakness is ongoing.Gait and coordination are affected as well. Cues for safe mobility provided throughout session. PT recommendation is for ST rehab at SNF, if pt/family are agreeable. Will continue to follow and progress activity as tolerated.         Recommendations for follow up therapy are one component of a multi-disciplinary discharge planning process, led by the attending physician.  Recommendations may be updated based on patient status, additional functional criteria and insurance authorization.  Follow Up Recommendations Skilled nursing-short term rehab (<3 hours/day) Can patient physically be transported by private vehicle: No    Assistance Recommended at Discharge Frequent or constant Supervision/Assistance  Patient can return home with the following  Assist for transportation;Assistance with cooking/housework;Help with stairs or ramp for entrance    Equipment Recommendations None recommended by PT  Recommendations for Other Services       Functional Status Assessment Patient has had a recent decline in their  functional status and demonstrates the ability to make significant improvements in function in a reasonable and predictable amount of time.     Precautions / Restrictions Precautions Precautions: Fall Precaution Comments: Right-sided hemiparesis Restrictions Weight Bearing Restrictions: No      Mobility  Bed Mobility Overal bed mobility: Needs Assistance Bed Mobility: Supine to Sit     Supine to sit: Min guard, HOB elevated Sit to supine: Min guard, HOB elevated   General bed mobility comments: Increased time.Cues provided. HOB ~45 degrees    Transfers Overall transfer level: Needs assistance Equipment used: Rolling walker (2 wheels) Transfers: Sit to/from Stand Sit to Stand: Min assist           General transfer comment: Cues for safety, hand placement. Assist to rise, steady, control descent.    Ambulation/Gait Ambulation/Gait assistance: Min assist Gait Distance (Feet): 60 Feet Assistive device: Rolling walker (2 wheels) Gait Pattern/deviations: Step-to pattern, Decreased stride length, Decreased step length - right, Decreased step length - left, Decreased dorsiflexion - right       General Gait Details: Nearly constant cues for increased step length R LE-pt tends to allow R LE to lag behind. Assist to steady pt throughou distance  Financial trader Rankin (Stroke Patients Only)       Balance Overall balance assessment: Needs assistance         Standing balance support: Bilateral upper extremity supported, Reliant on assistive device for balance, During functional activity Standing balance-Leahy Scale: Poor  Pertinent Vitals/Pain Pain Assessment Pain Assessment: No/denies pain    Home Living Family/patient expects to be discharged to:: Unsure Living Arrangements: Spouse/significant other Available Help at Discharge: Family;Available PRN/intermittently Type of  Home: House Home Access: Stairs to enter Entrance Stairs-Rails: None Entrance Stairs-Number of Steps: 6   Home Layout: One level Home Equipment: Conservation officer, nature (2 wheels);BSC/3in1;Wheelchair - manual Additional Comments: Pt reporting she has been staying with her daughter, but between daughter and husband, she can have 24/7 assist if needed    Prior Function Prior Level of Function : Needs assist             Mobility Comments: patient used RW for mobility. ADLs Comments: husband helps with IADLs per patient report.patient reported husband and daughter work during the day     [place]: Right    Extremity/Trunk Assessment   Upper Extremity Assessment Upper Extremity Assessment: Defer to OT evaluation    Lower Extremity Assessment RLE Deficits / Details: Strength 3/5 RLE Coordination: decreased gross motor    Cervical / Trunk Assessment Cervical / Trunk Assessment: Normal  Communication   Communication: No difficulties  Cognition Arousal/Alertness: Awake/alert Behavior During Therapy: WFL for tasks assessed/performed Overall Cognitive Status: Within Functional Limits for tasks assessed                                          General Comments      Exercises General Exercises - Lower Extremity Ankle Circles/Pumps: AROM, Both, 5 reps Quad Sets: AROM, Right, 5 reps Short Arc Quad: AROM, Right, 5 reps Long Arc Quad: AROM, Right, 5 reps, Seated Hip ABduction/ADduction: AROM, Right, 5 reps Straight Leg Raises: AROM, Right, 5 reps   Assessment/Plan    PT Assessment Patient needs continued PT services  PT Problem List Decreased strength;Decreased coordination;Decreased mobility;Decreased balance;Decreased activity tolerance;Decreased knowledge of use of DME       PT Treatment Interventions Gait training;DME instruction;Therapeutic exercise;Functional mobility training;Therapeutic activities;Patient/family education;Balance  training;Wheelchair mobility training    PT Goals (Current goals can be found in the Care Plan section)  Acute Rehab PT Goals Patient Stated Goal: home PT Goal Formulation: With patient Time For Goal Achievement: 06/08/22 Potential to Achieve Goals: Good    Frequency Min 2X/week     Co-evaluation               AM-PAC PT "6 Clicks" Mobility  Outcome Measure Help needed turning from your back to your side while in a flat bed without using bedrails?: A Little Help needed moving from lying on your back to sitting on the side of a flat bed without using bedrails?: A Little Help needed moving to and from a bed to a chair (including a wheelchair)?: A Little Help needed standing up from a chair using your arms (e.g., wheelchair or bedside chair)?: A Little Help needed to walk in hospital room?: A Little Help needed climbing 3-5 steps with a railing? : A Lot 6 Click Score: 17    End of Session Equipment Utilized During Treatment: Gait belt Activity Tolerance: Patient tolerated treatment well Patient left: in bed;with call bell/phone within reach;with bed alarm set   PT Visit Diagnosis: Difficulty in walking, not elsewhere classified (R26.2);Unsteadiness on feet (R26.81);Muscle weakness (generalized) (M62.81);Other abnormalities of gait and mobility (R26.89);Hemiplegia and hemiparesis Hemiplegia - Right/Left: Right    Time: [id number] PT Time Calculation (min) (ACUTE  ONLY): 22 min   Charges:     PT Treatments $Gait Training: 8-22 mins          Doreatha Massed, PT Acute Rehabilitation  Office: [phone]

## 2022-05-26 ENCOUNTER — Telehealth: Payer: Self-pay | Admitting: Internal Medicine

## 2022-05-26 MED ORDER — CLOTRIMAZOLE 1 % EX CREA
TOPICAL_CREAM | Freq: Two times a day (BID) | CUTANEOUS | Status: DC
  Filled 2022-05-26: qty 15

## 2022-05-26 MED ORDER — OXYCODONE HCL 5 MG PO TABS: 5.0000 mg | ORAL_TABLET | Freq: Four times a day (QID) | ORAL | 0 refills | Status: AC | PRN

## 2022-05-26 NOTE — Telephone Encounter (Signed)
Scheduled per scheduled message, called and spoke with patient's daughter regarding upcoming appointments.

## 2022-05-26 NOTE — Progress Notes (Signed)
AVS instructions were reviewed with patient and husband at bedside. All personal belongings were returned, all questions answered. Husband at bedside.

## 2022-05-26 NOTE — Progress Notes (Signed)
Mobility Specialist - Progress Note   05/26/22 1121  Mobility  Activity Ambulated with assistance in hallway  Level of Assistance Contact guard assist, steadying assist  Assistive Device Front wheel walker  Distance Ambulated (ft) 80 ft  Activity Response Tolerated well  Mobility Referral Yes  $Mobility charge 1 Mobility   Pt received in bed and agreeable to mobility. Pt was contact guard during ambulation due weakness on R side. Pt required verbal cues when turning around w/ walker. No complaints during session. Pt to bed after session with all needs met.     [facility]

## 2022-05-26 NOTE — TOC Initial Note (Signed)
Transition of Care [facility]) - Initial/Assessment Note    Patient Details  Name: Sandra Horne MRN: [id number] Date of Birth: 06-24-1955  Transition of Care [facility]) CM/SW Contact:    Angelita Ingles, RN Phone [phone]  05/26/2022, 1:07 PM  Clinical Narrative:                 TOC following patient with recent discharge. Patient has been readmitted with a new SNF recommendation. Patient refused SNF and states that she is going home with daughter and grand daughters who will provide 24 hour care. Patient states that she has all needed DME including ( hospital bed, wheelchair, shower chair, BSC, ) Home health orders have been entered. CM at bedside to offer choice. Patient states that she is willing to accept home health and has no preference for agency. Home health referral has been accepted by [facility] with Alvis Lemmings. Agency can provide [facility] PT/OT. MD has been made aware. AVS has been updated. No other needs noted at this time.     Barriers to Discharge: No Barriers Identified   Patient Goals and CMS Choice Patient states their goals for this hospitalization and ongoing recovery are:: Wants to get better to go home. CMS Medicare.gov Compare Post Acute Care list provided to:: Patient Choice offered to / list presented to : Patient [place] ownership interest in [facility].provided to:: Patient    Expected Discharge Plan and Services In-house Referral: NA Discharge Planning Services: CM Consult Post Acute Care Choice: [place] arrangements for the past 2 months: Single Family Home Expected Discharge Date: 05/26/22               DME Arranged: N/A DME Agency: NA                  Prior Living Arrangements/Services Living arrangements for the past 2 months: Single Family Home Lives with:: Spouse Patient language and need for interpreter reviewed:: Yes Do you feel safe going back to the place where you live?: Yes      Need for Family  Participation in Patient Care: Yes (Comment) Care giver support system in place?: Yes (comment) Current home services: DME (wheelchair, cane . walker. BSC. hospital bed) Criminal Activity/Legal Involvement Pertinent to Current Situation/Hospitalization: No - Comment as needed  Activities of Daily Living Home Assistive Devices/Equipment: Environmental consultant (specify type), Wheelchair ADL Screening (condition at time of admission) Patient's cognitive ability adequate to safely complete daily activities?: No Is the patient deaf or have difficulty hearing?: No Does the patient have difficulty seeing, even when wearing glasses/contacts?: No Does the patient have difficulty concentrating, remembering, or making decisions?: Yes Patient able to express need for assistance with ADLs?: Yes Does the patient have difficulty dressing or bathing?: Yes Independently performs ADLs?: No Communication: Independent Dressing (OT): Needs assistance Is this a change from baseline?: Pre-admission baseline Grooming: Needs assistance Is this a change from baseline?: Pre-admission baseline Feeding: Independent Bathing: Needs assistance Is this a change from baseline?: Pre-admission baseline Toileting: Needs assistance Is this a change from baseline?: Pre-admission baseline In/Out Bed: Needs assistance Is this a change from baseline?: Pre-admission baseline Walks in Home: Needs assistance Is this a change from baseline?: Pre-admission baseline Does the patient have difficulty walking or climbing stairs?: Yes Weakness of Legs: Both Weakness of Arms/Hands: Both  Permission Sought/Granted Permission sought to share information with : Family Supports Permission granted to share information with : Yes, Verbal Permission Granted  Share Information with NAME: Thurston Pounds  Permission granted to share info w Relationship: daughter  Permission granted to share info w Contact Information: [phone]  Emotional  Assessment Appearance:: Appears stated age Attitude/Demeanor/Rapport: Gracious Affect (typically observed): Accepting, Pleasant, Quiet Orientation: : Oriented to Self, Oriented to Place, Oriented to  Time, Oriented to Situation Alcohol / Substance Use: Not Applicable Psych Involvement: No (comment)  Admission diagnosis:  Intracranial hemorrhage (HCC) [I62.9] Intracranial bleed (HCC) [I62.9] Patient Active Problem List   Diagnosis Date Noted   Intracranial hemorrhage ([place]) 05/23/2022   Palliative care encounter 05/11/2022   Pain 05/11/2022   Right sided weakness 05/11/2022   Cerebral edema ([place]) 05/11/2022   Metastasis to brain ([place]) 05/11/2022   Goals of care, counseling/discussion 05/11/2022   Hypokalemia 05/08/2022   Lung cancer metastatic to brain ([place]) 05/08/2022   Thrombocytopenia ([place]) 05/08/2022   Malnutrition of moderate degree 11/02/2021   Rt Sided Weakness due to Vasogenic Brain edema 10/31/2021   Tobacco abuse 10/31/2021   Closed displaced fracture of right femoral neck ([place]) 10/12/2021   Malignant neoplasm metastatic to brain ([place]) 03/27/2021   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 ([place]) 03/26/2021   Acute encephalopathy 03/25/2021   Primary cancer of left lower lobe of lung ([place]) 01/14/2020   CLOSED FRACTURE OF UNSPECIFIED PART OF HUMERUS 04/01/2010   PCP:  Pcp, No Pharmacy:   [place] [place] [facility] [address] [address] [place] [address] Phone: [phone] Fax: [phone]     Social Determinants of Health (SDOH) Social History: SDOH Screenings   Food Insecurity: No Food Insecurity (05/24/2022)  Housing: Low Risk  (05/24/2022)  Transportation Needs: No Transportation Needs (05/24/2022)  Recent Concern: Transportation Needs - Unmet Transportation Needs (05/08/2022)  Utilities: Not At Risk (05/24/2022)  Financial Resource Strain: Low Risk  (01/14/2020)  Physical Activity: Inactive  (01/14/2020)  Social Connections: Socially Isolated (01/14/2020)  Stress: No Stress Concern Present (01/14/2020)  Tobacco Use: High Risk (05/23/2022)   SDOH Interventions: Housing Interventions: Inpatient TOC   Readmission Risk Interventions    05/26/2022   12:55 PM 10/14/2021   10:00 AM  Readmission Risk Prevention Plan  Transportation Screening Complete Complete  PCP or Specialist Appt within 5-7 Days Complete Not Complete  Home Care Screening Complete Complete  Medication Review (RN CM) Complete Complete

## 2022-05-26 NOTE — Discharge Summary (Signed)
Physician Discharge Summary  Sandra Horne [id number] DOB: [birth year] DOA: 05/23/2022  PCP: Merryl Hacker, No  Admit date: 05/23/2022 Discharge date: 05/26/2022  Admitted From: Home Disposition: Home with home health  Recommendations for Outpatient Follow-up:  Will defer to palliative care to be seen at home Oncology office will schedule follow-up  Home Health: PT/OT/RN/home health aide Equipment/Devices: Already present at home  Discharge Condition: Fair CODE STATUS: Full code Diet recommendation: Regular diet, nutritional supplements.  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 Hospice was recommended but patient wanted to continue radiation and remained full code.   She presented on 05/23/2022 with right-sided weakness to [facility]. On presentation, chest x-ray showed unchanged appearance of left hilar mass/malignancy.  CT of the head without contrast showed new hemorrhage in posterior left occipital lobe metastatic lesion, extensive vasogenic edema and rightward midline shift measuring up to 1 cm both of these unchanged along with previously seen intracranial metastasis.  EDP spoke to neurosurgery on-call who recommended transfer to [facility] and repeat CT scan in 24 hours. She remained stable. She was then  transferred to [facility] to complete her 10 th dose of radiation treatment.  Currently stable.  Overall poor prognosis.  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 Currently clinically stable. Continue Decadron that is she is on prolonged taper.  Continue Keppra. Adequate pain management for cancer related pain, hip pain. Seen by PT OT.  Recommended SNF.  She has multiple family members available at home and she wants to go home with home health PT OT.  Will maximize home health therapies.  Goal of care: Previous multiple discussions about incurable nature of disease.  Previous multiple palliative care conversations. Met with patient, patient's daughter, granddaughter and niece at the bedside and updated them about intracranial metastatic lesion, likely poor prognosis and intolerance to chemotherapy and not liking chemotherapy.  Recommended home hospice for ongoing support and symptom management including medications, however patient and family did not agree with hospice program at this time.  They are agreeable for palliative care follow-up.  Recommended DNR, did not agree. Discussed with Dr. Earlie Server . they will schedule follow-up in 2 weeks. Updated palliative care team, will send referral for outpatient follow-up.  Can go home today with family.   Discharge Diagnoses:  Principal Problem:   Intracranial hemorrhage ([place]) Active Problems:   Lung cancer metastatic to brain [facility])   Cerebral edema [facility])    Discharge Instructions  Discharge Instructions     Amb Referral to Palliative Care   Complete by: As directed    Diet - low sodium heart healthy   Complete by: As directed    Increase activity slowly   Complete by: As directed       Allergies as of 05/26/2022   No Known Allergies      Medication List     STOP taking these medications    aspirin EC 81 MG tablet   lidocaine 5 % Commonly known as: Lidoderm   methocarbamol 500 MG tablet Commonly known as: ROBAXIN   NICODERM CQ TD  TAKE these medications    dexamethasone 2 MG tablet Commonly known as: DECADRON Take 2 tablets (4 mg total) by  mouth 2 (two) times daily for 7 days, THEN 1 tablet (2 mg total) 2 (two) times daily for 7 days, THEN 1 tablet (2 mg total) daily for 7 days, THEN 1 tablet (2 mg total) every other day for 7 days. Start taking on: May 17, 2022   gabapentin 300 MG capsule Commonly known as: NEURONTIN Take 1 capsule (300 mg total) by mouth 3 (three) times daily. What changed:  when to take this reasons to take this   ibuprofen 800 MG tablet Commonly known as: ADVIL Take 1 tablet (800 mg total) by mouth every 8 (eight) hours as needed. What changed: reasons to take this   levETIRAcetam 500 MG tablet Commonly known as: KEPPRA Take 1 tablet (500 mg total) by mouth 2 (two) times daily.   naproxen 375 MG tablet Commonly known as: NAPROSYN Take 1 tablet (375 mg total) by mouth every 12 (twelve) hours as needed.   oxyCODONE 5 MG immediate release tablet Commonly known as: Roxicodone Take 1 tablet (5 mg total) by mouth every 6 (six) hours as needed for up to 5 days for breakthrough pain.   pantoprazole 40 MG tablet Commonly known as: PROTONIX Take 1 tablet (40 mg total) by mouth daily.   polyethylene glycol 17 g packet Commonly known as: MIRALAX / GLYCOLAX Take 17 g by mouth daily.   senna 8.6 MG Tabs tablet Commonly known as: SENOKOT Take 1 tablet (8.6 mg total) by mouth 2 (two) times daily for 14 days.        No Known Allergies  Consultations: Oncology Radiation oncology Neurosurgery   Procedures/Studies: CT HEAD WO CONTRAST (5MM)  Result Date: 05/24/2022 CLINICAL DATA:  Follow-up brain metastases EXAM: CT HEAD WITHOUT CONTRAST TECHNIQUE: Contiguous axial images were obtained from the base of the skull through the vertex without intravenous contrast. RADIATION DOSE REDUCTION: This exam was performed according to the departmental dose-optimization program which includes automated exposure control, adjustment of the mA and/or kV according to patient size and/or use of iterative  reconstruction technique. COMPARISON:  CT head 1 day prior, brain MRI 04/17/2022 FINDINGS: Brain: The 1.8 cm x 1.4 cm metastatic lesion in the left frontal is unchanged, with unchanged extensive surrounding edema. The small focus of hemorrhage in the left temporooccipital region measuring approximately 9 mm with surrounding edema is unchanged, likely related to additional metastatic disease. There is significant mass effect with partial effacement of the right lateral ventricle and 1.0 cm rightward midline shift. The dominant right cerebellar metastatic lesion is grossly similar, with unchanged surrounding edema and partial effacement of fourth ventricle. There is no evidence of developing hydrocephalus. There is no new acute intracranial hemorrhage, extra-axial fluid collection, or acute territorial infarct. Vascular: No hyperdense vessel or unexpected calcification. Skull: Normal. Negative for fracture or focal lesion. Sinuses/Orbits: The imaged paranasal sinuses are clear. The globes and orbits are unremarkable. Other: None. IMPRESSION: 1. No significant interval change since the CT from 1 day prior. 2. Unchanged small focus of hemorrhage in the left temporooccipital region. 3. Unchanged left frontal metastatic lesion with extensive surrounding edema and 1.0 cm leftward midline shift. 4. Unchanged right cerebellar lesion with surrounding edema. Additional lesions seen on the prior brain MRI are not well seen on the current noncontrast CT. No evidence of developing hydrocephalus. Electronically Signed   By: Valetta Mole M.D.   On: 05/24/2022 09:08  CT Head Wo Contrast  Result Date: 05/23/2022 CLINICAL DATA:  TIA EXAM: CT HEAD WITHOUT CONTRAST TECHNIQUE: Contiguous axial images were obtained from the base of the skull through the vertex without intravenous contrast. RADIATION DOSE REDUCTION: This exam was performed according to the departmental dose-optimization program which includes automated exposure  control, adjustment of the mA and/or kV according to patient size and/or use of iterative reconstruction technique. COMPARISON:  CT head 05/08/22, MRI Brain 04/17/22 FINDINGS: Brain: Redemonstrated is extensive vasogenic edema in the left cerebral hemisphere which appears centered around a cystic metastatic lesion in the posterior left frontal lobe measuring approximately 1.6 x 1.6 cm, unchanged from prior. The degree of rightward midline shift is also unchanged measuring up to 1.0 cm. Compared to prior exam there is new hemorrhage in the posterior left temporal lobe (series 2, image 14). Additional vision of the loss of present in the right cerebellar hemisphere the right frontal lobe, in the periatrial white matter on the right, all of which are suspicious for regions of intracranial metastases. Size and shape of the ventricular system is unchanged from prior exam. Vascular: No hyperdense vessel or unexpected calcification. Skull: Normal. Negative for fracture or focal lesion. Sinuses/Orbits: No mastoid effusion. Paranasal sinuses are clear. Orbits are unremarkable. Other: None. IMPRESSION: 1. New hemorrhage in the posterior left occipital lobe metastatic lesion. 2. Extensive vasogenic edema in the left cerebral hemisphere centered around a cystic metastatic lesion in the posterior left frontal lobe, unchanged from prior exam. Unchanged rightward midline shift measuring up to 1.0 cm. 3. Additional previously seen intracranial metastases on brain MRI dated 04/17/22 are poorly visualized due to CT technique. No definite new metastases. Electronically Signed   By: Marin Roberts M.D.   On: 05/23/2022 15:03   DG Chest Portable 1 View  Result Date: 05/23/2022 CLINICAL DATA:  Weakness. Patient with lung cancer and brain metastases. EXAM: PORTABLE CHEST 1 VIEW COMPARISON:  05/08/2022 and prior studies FINDINGS: LEFT hilar mass and LEFT LOWER lung opacities are not significantly changed. The cardiomediastinal silhouette  is otherwise unremarkable. No new pulmonary opacities are present. There is no evidence of new airspace disease, pleural effusion or pneumothorax. No acute bony abnormalities are noted. IMPRESSION: Unchanged appearance of the chest with LEFT hilar mass/malignancy and LEFT LOWER lung opacities likely representing postobstructive pneumonitis. Electronically Signed   By: Margarette Canada M.D.   On: 05/23/2022 14:33   CT Head Wo Contrast  Result Date: 05/08/2022 CLINICAL DATA:  Headache, neuro deficit. History of lung cancer with brain metastases. EXAM: CT HEAD WITHOUT CONTRAST TECHNIQUE: Contiguous axial images were obtained from the base of the skull through the vertex without intravenous contrast. RADIATION DOSE REDUCTION: This exam was performed according to the departmental dose-optimization program which includes automated exposure control, adjustment of the mA and/or kV according to patient size and/or use of iterative reconstruction technique. COMPARISON:  Head MRI 04/17/2022 FINDINGS: Brain: A known 2 cm cystic metastasis is again seen in the left parietal lobe. Extensive vasogenic edema throughout the left cerebral hemisphere has mildly worsened with increased rightward midline shift, now measuring 10 mm (previously 8 mm). There is partial effacement of the left lateral ventricle. There is no evidence of intracapsular trapping. Mild edema associated with a metastasis in the posterior right temporal lobe has also likely mildly increased without associated mass effect. There is an approximately 2 cm lesion in the right cerebellar hemisphere without frank enlargement compared to the prior MRI, however prominent right cerebellar edema has likely mildly  increased with increased mass effect on the fourth ventricle and mild mass effect on the right dorsal pons. Minimal left cerebellar edema has not significantly changed. No acute cortical infarct, intracranial hemorrhage, or extra-axial fluid collection is evident.  Vascular: Calcified atherosclerosis at the skull base. No hyperdense vessel. Skull: No acute fracture or suspicious osseous lesion. Sinuses/Orbits: Visualized paranasal sinuses and mastoid air cells are clear. Were its are unremarkable. Other: None. IMPRESSION: 1. Mildly increased brain edema associated with known metastases, including extensive edema throughout the left cerebral hemisphere with increased rightward midline shift (now 10 mm). 2. Mildly increased right cerebellar edema with partial effacement of the fourth ventricle. No obstructive hydrocephalus. Electronically Signed   By: Logan Bores M.D.   On: 05/08/2022 17:51   DG Chest 2 View  Result Date: 05/08/2022 CLINICAL DATA:  Cough and wheezing. EXAM: CHEST - 2 VIEW COMPARISON:  04/06/2022, chest radiographs and chest CT. FINDINGS: Patchy opacity noted at the left lung base or spine Stu the airspace opacities noted on the previous day's CT. Remainder of the lungs is clear. Cardiac silhouette is normal in size. No mediastinal masses. Left hilar prominence corresponds to the mass noted on the previous day's CT. Normal right hilar contours. No pleural effusion or pneumothorax. Skeletal structures are intact. IMPRESSION: 1. Left hilar mass and patchy opacities at the left lung base, the latter finding suspected to be postobstructive pneumonitis. Overall, no change from the previous day's chest CT. Electronically Signed   By: Lajean Manes M.D.   On: 05/08/2022 17:09   (Echo, Carotid, EGD, Colonoscopy, ERCP)    Subjective: Patient seen in the morning rounds.  Denied any complaints.  She does have diffuse pain mostly on the back and hips.  Went back to talk to patient's family when they arrived at the bedside, we discussed about ongoing issues, prognosis, intolerance to chemotherapy and not liking chemotherapy including risks of more hemorrhagic conversion of tumor inside the brain.  We discussed about palliative, hospice cares.  Patient is eager to go  home and her family is eager to take her home.  They would like to continue natural therapies including dietary supplementations and stay at home.   Discharge Exam: Vitals:   05/26/22 0629 05/26/22 0816  BP: (!) 116/97 134/79  Pulse: 90 84  Resp:  16  Temp: (!) 97.5 F (36.4 C) 97.8 F (36.6 C)  SpO2: 91%    Vitals:   05/25/22 1353 05/25/22 2033 05/26/22 0629 05/26/22 0816  BP: 117/71 131/73 (!) 116/97 134/79  Pulse: 64 70 90 84  Resp: 18   16  Temp: 98.2 F (36.8 C) 98.7 F (37.1 C) (!) 97.5 F (36.4 C) 97.8 F (36.6 C)  TempSrc: Oral Oral Oral Oral  SpO2: 100% 98% 91%   Weight:      Height:        General: Pt is alert, awake, not in acute distress Looks comfortable.  Pleasant to conversation today.  Alert awake and oriented x 4. Cardiovascular: RRR, S1/S2 +, no rubs, no gallops Respiratory: CTA bilaterally, no wheezing, no rhonchi Abdominal: Soft, NT, ND, bowel sounds + Extremities: no edema, no cyanosis Right upper and lower extremity with weakness 4/5.    The results of significant diagnostics from this hospitalization (including imaging, microbiology, ancillary and laboratory) are listed below for reference.     Microbiology: No results found for this or any previous visit (from the past 240 hour(s)).   Labs: BNP (last 3 results) No results for  input(s): "BNP" in the last 8760 hours. Basic Metabolic Panel: Recent Labs  Lab 05/23/22 1405 05/24/22 0356  NA 140 138  K 3.8 4.2  CL 105 106  CO2 25 21*  GLUCOSE 122* 116*  BUN 9 8  CREATININE 0.70 0.72  CALCIUM 8.7* 8.6*   Liver Function Tests: Recent Labs  Lab 05/23/22 1405  AST 18  ALT 26  ALKPHOS 69  BILITOT 1.2  PROT 6.5  ALBUMIN 3.2*   No results for input(s): "LIPASE", "AMYLASE" in the last 168 hours. No results for input(s): "AMMONIA" in the last 168 hours. CBC: Recent Labs  Lab 05/23/22 1405 05/24/22 0356  WBC 9.3 9.3  NEUTROABS 7.8*  --   HGB 12.8 12.1  HCT 40.0 38.1  MCV  89.9 90.3  PLT 342 320   Cardiac Enzymes: No results for input(s): "CKTOTAL", "CKMB", "CKMBINDEX", "TROPONINI" in the last 168 hours. BNP: Invalid input(s): "POCBNP" CBG: No results for input(s): "GLUCAP" in the last 168 hours. D-Dimer No results for input(s): "DDIMER" in the last 72 hours. Hgb A1c No results for input(s): "HGBA1C" in the last 72 hours. Lipid Profile No results for input(s): "CHOL", "HDL", "LDLCALC", "TRIG", "CHOLHDL", "LDLDIRECT" in the last 72 hours. Thyroid function studies No results for input(s): "TSH", "T4TOTAL", "T3FREE", "THYROIDAB" in the last 72 hours.  Invalid input(s): "FREET3" Anemia work up No results for input(s): "VITAMINB12", "FOLATE", "FERRITIN", "TIBC", "IRON", "RETICCTPCT" in the last 72 hours. Urinalysis    Component Value Date/Time   COLORURINE YELLOW 05/08/2022 2203   APPEARANCEUR CLEAR 05/08/2022 2203   LABSPEC 1.013 05/08/2022 2203   PHURINE 6.0 05/08/2022 2203   GLUCOSEU NEGATIVE 05/08/2022 2203   HGBUR NEGATIVE 05/08/2022 2203   BILIRUBINUR NEGATIVE 05/08/2022 2203   KETONESUR NEGATIVE 05/08/2022 2203   PROTEINUR NEGATIVE 05/08/2022 2203   NITRITE NEGATIVE 05/08/2022 2203   LEUKOCYTESUR NEGATIVE 05/08/2022 2203   Sepsis Labs Recent Labs  Lab 05/23/22 1405 05/24/22 0356  WBC 9.3 9.3   Microbiology No results found for this or any previous visit (from the past 240 hour(s)).   Time coordinating discharge:  35 minutes  SIGNED:   Barb Merino, MD  [facility] 05/26/2022, 11:11 AM

## 2022-05-27 NOTE — Progress Notes (Signed)
                                                                                                                                                             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    Intent: Palliative  Radiation Treatment Dates: 05/03/2022 through 05/25/2022 Site Technique Total Dose (Gy) Dose per Fx (Gy) Completed Fx Beam Energies  Brain: Brain IMRT 30/30 3 10/10 6X   Narrative: The patient tolerated radiation therapy relatively well.  She did report progressive right-sided weakness and generalized fatigue/malaise.  Plan: The patient will receive a call in about one month from the radiation oncology department. She will continue follow up with the palliative care team and her medical oncologist, Dr. Julien Nordmann as well.  She is currently scheduled for a follow-up visit with Dr. Julien Nordmann on 06/10/2022 to further discuss systemic treatment options.  ------------------------------------------------   Tyler Pita, MD [place]: [phone]  Fax: [phone] [email or web address]  Skype  [email or web address]

## 2022-05-28 ENCOUNTER — Telehealth: Payer: Self-pay | Admitting: Medical Oncology

## 2022-05-28 NOTE — Telephone Encounter (Signed)
[facility] notified that decision re- home palliative care will be discussed at pt next visit with Penni Homans , NP palliative care at the [facility].

## 2022-06-03 ENCOUNTER — Telehealth: Payer: Self-pay | Admitting: Radiation Therapy

## 2022-06-03 NOTE — Telephone Encounter (Addendum)
I called to check on Sandra Horne, her daughter answered the phone. She was there with the patient so I was able to speak with them both over speaker phone. Sandra Horne has tapered down off of steroids. She continues to have weakness and needs assistance walking but overall feels well. Her symptoms have not gotten better or worse since stopping steroids, she reports that things are the same.   I reminded them of her appointment with Dr. Julien Nordmann on 3/14, she plans to attend.   Mont Dutton R.T.(R)(T) Radiation Special Procedures Navigator

## 2022-06-10 ENCOUNTER — Inpatient Hospital Stay: Payer: 59 | Attending: Physician Assistant | Admitting: Internal Medicine

## 2022-06-11 NOTE — Progress Notes (Unsigned)
      Telephone visit scheduled with  Windy Canny Gensel for ongoing goals of care discussions and potential consideration for outpatient Palliative support. Chart reviewed. Attempted to contact patient, her daughter, [name], and spouse, [name]. Unable to reach all parties. No voicemail available.   PMT will re-attempt to contact family at a later time/date. Detailed note and recommendations to follow once able to connect with patient in the future.   Alda Lea, AGPCNP-BC Palliative Medicine Team

## 2022-06-15 ENCOUNTER — Encounter: Payer: Self-pay | Admitting: Nurse Practitioner

## 2022-06-15 ENCOUNTER — Inpatient Hospital Stay (HOSPITAL_BASED_OUTPATIENT_CLINIC_OR_DEPARTMENT_OTHER): Payer: 59 | Admitting: Nurse Practitioner

## 2022-06-15 ENCOUNTER — Ambulatory Visit
Admission: RE | Admit: 2022-06-15 | Discharge: 2022-06-15 | Disposition: A | Discharge status: Home / Self Care | Payer: 59 | Source: Ambulatory Visit | Attending: Radiation Oncology | Admitting: Radiation Oncology

## 2022-06-15 DIAGNOSIS — Z515 Encounter for palliative care: Secondary | ICD-10-CM

## 2022-06-15 DIAGNOSIS — Z7189 Other specified counseling: Secondary | ICD-10-CM | POA: Diagnosis not present

## 2022-06-15 NOTE — Progress Notes (Signed)
  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    Intent: Palliative  Radiation Treatment Dates: 05/03/2022 through 05/25/2022 Site Technique Total Dose (Gy) Dose per Fx (Gy) Completed Fx Beam Energies  Brain: Brain IMRT 30/30 3 10/10 6X   (as documented in provider EOT note)   The patient was not available for call today. No voicemail available.  The patient was counseled that she will be contacted by our brain and spine navigator to schedule surveillance imaging. The patient was encouraged to call if  she have not received a call to schedule imaging, or if she develop concerns or questions regarding radiation. The patient will also continue to follow up with Dr. Julien Nordmann  in medical oncology.    Leandra Kern, LPN

## 2022-06-21 ENCOUNTER — Other Ambulatory Visit: Payer: Self-pay | Admitting: Radiation Therapy

## 2022-06-21 DIAGNOSIS — C7931 Secondary malignant neoplasm of brain: Secondary | ICD-10-CM

## 2022-06-22 ENCOUNTER — Telehealth: Payer: Self-pay

## 2022-06-22 NOTE — Telephone Encounter (Signed)
This nurse received a call from this patient's daughter stating that patient has swelling in her leg and arm.  She also stated that at her last radiation treatment on 3/19 something in her eye popped.  Daughter is requesting an appointment with provider.  This nurse forwarded request to provider for advisement.

## 2022-06-28 ENCOUNTER — Emergency Department (HOSPITAL_COMMUNITY): Payer: 59

## 2022-06-28 ENCOUNTER — Inpatient Hospital Stay (HOSPITAL_COMMUNITY)
Admission: EM | Admit: 2022-06-28 | Discharge: 2022-07-02 | DRG: 193 | Disposition: A | Discharge status: Home Health | Payer: 59 | Attending: Internal Medicine | Admitting: Internal Medicine

## 2022-06-28 ENCOUNTER — Encounter (HOSPITAL_COMMUNITY): Payer: Self-pay | Admitting: *Deleted

## 2022-06-28 ENCOUNTER — Other Ambulatory Visit: Payer: Self-pay

## 2022-06-28 DIAGNOSIS — C3432 Malignant neoplasm of lower lobe, left bronchus or lung: Secondary | ICD-10-CM | POA: Diagnosis not present

## 2022-06-28 DIAGNOSIS — D75838 Other thrombocytosis: Secondary | ICD-10-CM | POA: Diagnosis not present

## 2022-06-28 DIAGNOSIS — R109 Unspecified abdominal pain: Secondary | ICD-10-CM | POA: Diagnosis not present

## 2022-06-28 DIAGNOSIS — N281 Cyst of kidney, acquired: Secondary | ICD-10-CM | POA: Diagnosis not present

## 2022-06-28 DIAGNOSIS — Z7189 Other specified counseling: Secondary | ICD-10-CM | POA: Diagnosis not present

## 2022-06-28 DIAGNOSIS — Z79899 Other long term (current) drug therapy: Secondary | ICD-10-CM | POA: Diagnosis not present

## 2022-06-28 DIAGNOSIS — Z87891 Personal history of nicotine dependence: Secondary | ICD-10-CM | POA: Diagnosis not present

## 2022-06-28 DIAGNOSIS — E43 Unspecified severe protein-calorie malnutrition: Secondary | ICD-10-CM | POA: Insufficient documentation

## 2022-06-28 DIAGNOSIS — Z9181 History of falling: Secondary | ICD-10-CM | POA: Diagnosis not present

## 2022-06-28 DIAGNOSIS — E8809 Other disorders of plasma-protein metabolism, not elsewhere classified: Secondary | ICD-10-CM | POA: Diagnosis not present

## 2022-06-28 DIAGNOSIS — Z5982 Transportation insecurity: Secondary | ICD-10-CM | POA: Diagnosis not present

## 2022-06-28 DIAGNOSIS — Z6820 Body mass index (BMI) 20.0-20.9, adult: Secondary | ICD-10-CM | POA: Diagnosis not present

## 2022-06-28 DIAGNOSIS — C3490 Malignant neoplasm of unspecified part of unspecified bronchus or lung: Secondary | ICD-10-CM | POA: Diagnosis not present

## 2022-06-28 DIAGNOSIS — C7931 Secondary malignant neoplasm of brain: Secondary | ICD-10-CM | POA: Diagnosis not present

## 2022-06-28 DIAGNOSIS — D849 Immunodeficiency, unspecified: Secondary | ICD-10-CM | POA: Diagnosis not present

## 2022-06-28 DIAGNOSIS — R531 Weakness: Secondary | ICD-10-CM

## 2022-06-28 DIAGNOSIS — Z823 Family history of stroke: Secondary | ICD-10-CM | POA: Diagnosis not present

## 2022-06-28 DIAGNOSIS — Z923 Personal history of irradiation: Secondary | ICD-10-CM

## 2022-06-28 DIAGNOSIS — R64 Cachexia: Secondary | ICD-10-CM | POA: Diagnosis present

## 2022-06-28 DIAGNOSIS — Z8249 Family history of ischemic heart disease and other diseases of the circulatory system: Secondary | ICD-10-CM | POA: Diagnosis not present

## 2022-06-28 DIAGNOSIS — R54 Age-related physical debility: Secondary | ICD-10-CM | POA: Diagnosis present

## 2022-06-28 DIAGNOSIS — F17210 Nicotine dependence, cigarettes, uncomplicated: Secondary | ICD-10-CM | POA: Diagnosis present

## 2022-06-28 DIAGNOSIS — E440 Moderate protein-calorie malnutrition: Secondary | ICD-10-CM | POA: Diagnosis not present

## 2022-06-28 DIAGNOSIS — Z96641 Presence of right artificial hip joint: Secondary | ICD-10-CM | POA: Diagnosis present

## 2022-06-28 DIAGNOSIS — D75839 Thrombocytosis, unspecified: Secondary | ICD-10-CM | POA: Diagnosis present

## 2022-06-28 DIAGNOSIS — Z510 Encounter for antineoplastic radiation therapy: Secondary | ICD-10-CM | POA: Diagnosis not present

## 2022-06-28 DIAGNOSIS — D630 Anemia in neoplastic disease: Secondary | ICD-10-CM | POA: Diagnosis present

## 2022-06-28 DIAGNOSIS — G8191 Hemiplegia, unspecified affecting right dominant side: Secondary | ICD-10-CM | POA: Diagnosis present

## 2022-06-28 DIAGNOSIS — Z720 Tobacco use: Secondary | ICD-10-CM | POA: Diagnosis present

## 2022-06-28 DIAGNOSIS — G936 Cerebral edema: Secondary | ICD-10-CM | POA: Diagnosis present

## 2022-06-28 DIAGNOSIS — Z515 Encounter for palliative care: Secondary | ICD-10-CM

## 2022-06-28 DIAGNOSIS — R4189 Other symptoms and signs involving cognitive functions and awareness: Secondary | ICD-10-CM | POA: Diagnosis present

## 2022-06-28 DIAGNOSIS — J189 Pneumonia, unspecified organism: Principal | ICD-10-CM | POA: Diagnosis present

## 2022-06-28 DIAGNOSIS — E876 Hypokalemia: Secondary | ICD-10-CM | POA: Diagnosis not present

## 2022-06-28 DIAGNOSIS — J984 Other disorders of lung: Secondary | ICD-10-CM | POA: Diagnosis not present

## 2022-06-28 DIAGNOSIS — R627 Adult failure to thrive: Secondary | ICD-10-CM | POA: Diagnosis present

## 2022-06-28 DIAGNOSIS — J181 Lobar pneumonia, unspecified organism: Secondary | ICD-10-CM | POA: Diagnosis not present

## 2022-06-28 HISTORY — DX: Malignant neoplasm of unspecified part of unspecified bronchus or lung: C34.90

## 2022-06-28 LAB — COMPREHENSIVE METABOLIC PANEL
ALT: 11 U/L (ref 0–44)
AST: 16 U/L (ref 15–41)
Albumin: 2.8 g/dL — ABNORMAL LOW (ref 3.5–5.0)
Alkaline Phosphatase: 70 U/L (ref 38–126)
Anion gap: 12 (ref 5–15)
BUN: 5 mg/dL — ABNORMAL LOW (ref 8–23)
CO2: 24 mmol/L (ref 22–32)
Calcium: 8.6 mg/dL — ABNORMAL LOW (ref 8.9–10.3)
Chloride: 101 mmol/L (ref 98–111)
Creatinine, Ser: 0.66 mg/dL (ref 0.44–1.00)
GFR, Estimated: >60 mL/min (ref >60)
Glucose, Bld: 87 mg/dL (ref 70–99)
Potassium: 3.3 mmol/L — ABNORMAL LOW (ref 3.5–5.1)
Sodium: 137 mmol/L (ref 135–145)
Total Bilirubin: 0.9 mg/dL (ref 0.3–1.2)
Total Protein: 6.6 g/dL (ref 6.5–8.1)

## 2022-06-28 LAB — URINALYSIS, ROUTINE W REFLEX MICROSCOPIC
Bilirubin Urine: NEGATIVE
Glucose, UA: NEGATIVE mg/dL
Ketones, ur: 20 mg/dL — AB
Leukocytes,Ua: NEGATIVE
Nitrite: NEGATIVE
Protein, ur: NEGATIVE mg/dL
Specific Gravity, Urine: >1.046 — ABNORMAL HIGH (ref 1.005–1.030)
pH: 6 (ref 5.0–8.0)

## 2022-06-28 LAB — CBC WITH DIFFERENTIAL/PLATELET
Abs Immature Granulocytes: 0.1 10*3/uL — ABNORMAL HIGH (ref 0.00–0.07)
Basophils Absolute: 0.1 10*3/uL (ref 0.0–0.1)
Basophils Relative: 1 %
Eosinophils Absolute: 0.1 10*3/uL (ref 0.0–0.5)
Eosinophils Relative: 1 %
HCT: 35.4 % — ABNORMAL LOW (ref 36.0–46.0)
Hemoglobin: 11.4 g/dL — ABNORMAL LOW (ref 12.0–15.0)
Immature Granulocytes: 1 %
Lymphocytes Relative: 17 %
Lymphs Abs: 1.3 10*3/uL (ref 0.7–4.0)
MCH: 28.4 pg (ref 26.0–34.0)
MCHC: 32.2 g/dL (ref 30.0–36.0)
MCV: 88.1 fL (ref 80.0–100.0)
Monocytes Absolute: 1.1 10*3/uL — ABNORMAL HIGH (ref 0.1–1.0)
Monocytes Relative: 14 %
Neutro Abs: 5.3 10*3/uL (ref 1.7–7.7)
Neutrophils Relative %: 66 %
Platelets: 522 10*3/uL — ABNORMAL HIGH (ref 150–400)
RBC: 4.02 MIL/uL (ref 3.87–5.11)
RDW: 15.9 % — ABNORMAL HIGH (ref 11.5–15.5)
WBC: 8.1 10*3/uL (ref 4.0–10.5)
nRBC: 0 % (ref 0.0–0.2)

## 2022-06-28 LAB — LIPASE, BLOOD: Lipase: 24 U/L (ref 11–51)

## 2022-06-28 MED ORDER — PROCHLORPERAZINE EDISYLATE 10 MG/2ML IJ SOLN: 10.0000 mg | INTRAMUSCULAR | Status: DC | PRN

## 2022-06-28 MED ORDER — GUAIFENESIN ER 600 MG PO TB12
600.0000 mg | ORAL_TABLET | Freq: Two times a day (BID) | ORAL | Status: AC
  Administered 2022-06-28 – 2022-06-30 (×6): 600 mg via ORAL
  Filled 2022-06-28 (×6): qty 1

## 2022-06-28 MED ORDER — SODIUM CHLORIDE 0.9 % IV SOLN
1.0000 g | INTRAVENOUS | Status: DC
  Administered 2022-06-29 – 2022-07-01 (×3): 1 g via INTRAVENOUS
  Filled 2022-06-28 (×3): qty 10

## 2022-06-28 MED ORDER — POLYETHYLENE GLYCOL 3350 17 G PO PACK
17.0000 g | PACK | Freq: Every day | ORAL | Status: DC
  Administered 2022-06-28 – 2022-07-02 (×5): 17 g via ORAL
  Filled 2022-06-28 (×5): qty 1

## 2022-06-28 MED ORDER — IOHEXOL 300 MG/ML  SOLN
80.0000 mL | Freq: Once | INTRAMUSCULAR | Status: AC | PRN
  Administered 2022-06-28: 80 mL via INTRAVENOUS

## 2022-06-28 MED ORDER — OXYCODONE HCL 5 MG PO TABS
5.0000 mg | ORAL_TABLET | ORAL | Status: DC | PRN
  Administered 2022-06-28 – 2022-07-01 (×10): 5 mg via ORAL
  Filled 2022-06-28 (×10): qty 1

## 2022-06-28 MED ORDER — ONDANSETRON HCL 4 MG/2ML IJ SOLN: 4.0000 mg | Freq: Four times a day (QID) | INTRAMUSCULAR | Status: DC | PRN

## 2022-06-28 MED ORDER — NICOTINE 14 MG/24HR TD PT24: 14.0000 mg | MEDICATED_PATCH | Freq: Every day | TRANSDERMAL | Status: DC | PRN

## 2022-06-28 MED ORDER — SODIUM CHLORIDE 0.9 % IV SOLN
500.0000 mg | Freq: Once | INTRAVENOUS | Status: AC
  Administered 2022-06-28: 500 mg via INTRAVENOUS
  Filled 2022-06-28: qty 5

## 2022-06-28 MED ORDER — ACETAMINOPHEN 325 MG PO TABS: 650.0000 mg | ORAL_TABLET | Freq: Four times a day (QID) | ORAL | Status: DC | PRN

## 2022-06-28 MED ORDER — IPRATROPIUM-ALBUTEROL 0.5-2.5 (3) MG/3ML IN SOLN
3.0000 mL | Freq: Three times a day (TID) | RESPIRATORY_TRACT | Status: DC
  Administered 2022-06-28 – 2022-06-30 (×5): 3 mL via RESPIRATORY_TRACT
  Filled 2022-06-28 (×5): qty 3

## 2022-06-28 MED ORDER — LEVETIRACETAM 500 MG PO TABS
500.0000 mg | ORAL_TABLET | Freq: Two times a day (BID) | ORAL | Status: DC
  Administered 2022-06-28 – 2022-07-02 (×9): 500 mg via ORAL
  Filled 2022-06-28 (×9): qty 1

## 2022-06-28 MED ORDER — IPRATROPIUM-ALBUTEROL 0.5-2.5 (3) MG/3ML IN SOLN
3.0000 mL | RESPIRATORY_TRACT | Status: DC | PRN
  Administered 2022-06-28: 3 mL via RESPIRATORY_TRACT
  Filled 2022-06-28: qty 3

## 2022-06-28 MED ORDER — SODIUM CHLORIDE 0.9 % IV SOLN
500.0000 mg | INTRAVENOUS | Status: DC
  Administered 2022-06-29 – 2022-06-30 (×2): 500 mg via INTRAVENOUS
  Filled 2022-06-28 (×3): qty 5

## 2022-06-28 MED ORDER — GABAPENTIN 300 MG PO CAPS: 300.0000 mg | ORAL_CAPSULE | Freq: Three times a day (TID) | ORAL | Status: DC | PRN

## 2022-06-28 MED ORDER — HEPARIN SODIUM (PORCINE) 5000 UNIT/ML IJ SOLN
5000.0000 [IU] | Freq: Three times a day (TID) | INTRAMUSCULAR | Status: DC
  Administered 2022-06-28 – 2022-07-02 (×11): 5000 [IU] via SUBCUTANEOUS
  Filled 2022-06-28 (×10): qty 1

## 2022-06-28 MED ORDER — FENTANYL CITRATE PF 50 MCG/ML IJ SOSY
12.5000 ug | PREFILLED_SYRINGE | INTRAMUSCULAR | Status: DC | PRN
  Administered 2022-06-28: 12.5 ug via INTRAVENOUS
  Filled 2022-06-28: qty 1

## 2022-06-28 MED ORDER — PANTOPRAZOLE SODIUM 40 MG PO TBEC
40.0000 mg | DELAYED_RELEASE_TABLET | Freq: Every day | ORAL | Status: DC
  Administered 2022-06-28 – 2022-07-02 (×5): 40 mg via ORAL
  Filled 2022-06-28 (×5): qty 1

## 2022-06-28 MED ORDER — POTASSIUM CHLORIDE IN NACL 20-0.9 MEQ/L-% IV SOLN
INTRAVENOUS | Status: AC
  Filled 2022-06-28: qty 1000

## 2022-06-28 MED ORDER — HYDROCOD POLI-CHLORPHE POLI ER 10-8 MG/5ML PO SUER: 5.0000 mL | Freq: Two times a day (BID) | ORAL | Status: DC | PRN

## 2022-06-28 MED ORDER — ONDANSETRON HCL 4 MG/2ML IJ SOLN
4.0000 mg | Freq: Once | INTRAMUSCULAR | Status: AC
  Administered 2022-06-28: 4 mg via INTRAVENOUS
  Filled 2022-06-28: qty 2

## 2022-06-28 MED ORDER — SODIUM CHLORIDE 0.9 % IV BOLUS
500.0000 mL | Freq: Once | INTRAVENOUS | Status: AC
  Administered 2022-06-28: 500 mL via INTRAVENOUS

## 2022-06-28 MED ORDER — SODIUM CHLORIDE 0.9 % IV SOLN
1.0000 g | Freq: Once | INTRAVENOUS | Status: AC
  Administered 2022-06-28: 1 g via INTRAVENOUS
  Filled 2022-06-28: qty 10

## 2022-06-28 MED ORDER — ORAL CARE MOUTH RINSE: 15.0000 mL | OROMUCOSAL | Status: DC | PRN

## 2022-06-28 MED ORDER — ACETAMINOPHEN 650 MG RE SUPP: 650.0000 mg | Freq: Four times a day (QID) | RECTAL | Status: DC | PRN

## 2022-06-28 MED ORDER — ONDANSETRON HCL 4 MG PO TABS: 4.0000 mg | ORAL_TABLET | Freq: Four times a day (QID) | ORAL | Status: DC | PRN

## 2022-06-28 NOTE — ED Provider Notes (Signed)
[facility] Provider Note   CSN: [id number] Arrival date & time: 06/28/22  [id number]     History  Chief Complaint  Patient presents with   Numbness    Sandra Horne is a 67 y.o. female.  She has a history of non-small cell lung cancer with mets to brain causing right-sided weakness.  She was seen 2/25, CT showing new hemorrhage within a metastatic lesion.  Transferred to [facility] and ultimately over to [facility] long with no operative intervention.  She continues to receive radiation treatment to her brain.  She is following with palliative but is declining hospice and still wants full scope of care.  She is brought in by her husband with concern for continuing to have weakness in her right arm and right leg.  Also not eating because she is very nauseous.  Has not moved her bowels in a few weeks.  She denies any headache chest pain short of breath abdominal pain or urinary symptoms.  The history is provided by the patient and the spouse.  Weakness Severity:  Moderate Duration:  2 weeks Timing:  Constant Progression:  Unchanged Relieved by:  Nothing Worsened by:  Activity Ineffective treatments:  Rest Associated symptoms: difficulty walking and nausea   Associated symptoms: no abdominal pain, no chest pain, no cough, no fever, no headaches, no shortness of breath and no vomiting   Risk factors: neurologic disease        Home Medications Prior to Admission medications   Medication Sig Start Date End Date Taking? Authorizing Provider  gabapentin (NEURONTIN) 300 MG capsule Take 1 capsule (300 mg total) by mouth 3 (three) times daily. Patient taking differently: Take 300 mg by mouth 3 (three) times daily as needed (for pain). 01/13/22   Carole Civil, MD  levETIRAcetam (KEPPRA) 500 MG tablet Take 1 tablet (500 mg total) by mouth 2 (two) times daily. 05/12/22 08/10/22  Shelly Coss, MD  naproxen (NAPROSYN) 375 MG tablet Take 1 tablet  (375 mg total) by mouth every 12 (twelve) hours as needed. 05/12/22   Shelly Coss, MD  pantoprazole (PROTONIX) 40 MG tablet Take 1 tablet (40 mg total) by mouth daily. 05/12/22   Shelly Coss, MD  polyethylene glycol (MIRALAX / GLYCOLAX) 17 g packet Take 17 g by mouth daily. 05/12/22   Shelly Coss, MD      Allergies    Patient has no known allergies.    Review of Systems   Review of Systems  Constitutional:  Positive for appetite change. Negative for fever.  Eyes:  Positive for visual disturbance (left blurry - says old).  Respiratory:  Negative for cough and shortness of breath.   Cardiovascular:  Negative for chest pain.  Gastrointestinal:  Positive for nausea. Negative for abdominal pain and vomiting.  Musculoskeletal:  Positive for gait problem.  Neurological:  Positive for weakness. Negative for headaches.    Physical Exam Updated Vital Signs BP 105/74 (BP Location: Left Arm)   Pulse 92   Temp 98.3 F (36.8 C) (Oral)   Resp 14   Ht 5\' 4"  (1.626 m)   Wt 54.9 kg   SpO2 100%   BMI 20.79 kg/m  Physical Exam Vitals and nursing note reviewed.  Constitutional:      General: She is not in acute distress.    Appearance: Normal appearance. She is well-developed.  HENT:     Head: Normocephalic and atraumatic.  Eyes:     Conjunctiva/sclera: Conjunctivae normal.  Cardiovascular:     Rate and Rhythm: Normal rate and regular rhythm.     Heart sounds: No murmur heard. Pulmonary:     Effort: Pulmonary effort is normal. No respiratory distress.     Breath sounds: Normal breath sounds.  Abdominal:     Palpations: Abdomen is soft.     Tenderness: There is no abdominal tenderness.  Musculoskeletal:        General: No swelling.     Cervical back: Neck supple.  Skin:    General: Skin is warm and dry.     Capillary Refill: Capillary refill takes less than 2 seconds.  Neurological:     Mental Status: She is alert.     Comments: He is awake and alert.  She has no gross  facial asymmetry no dysarthric speech.  She has some weakness in her grip strength on the right and also 4+ strength on her right leg.     ED Results / Procedures / Treatments   Labs (all labs ordered are listed, but only abnormal results are displayed) Labs Reviewed  COMPREHENSIVE METABOLIC PANEL - Abnormal; Notable for the following components:      Result Value   Potassium 3.3 (*)    BUN 5 (*)    Calcium 8.6 (*)    Albumin 2.8 (*)    All other components within normal limits  CBC WITH DIFFERENTIAL/PLATELET - Abnormal; Notable for the following components:   Hemoglobin 11.4 (*)    HCT 35.4 (*)    RDW 15.9 (*)    Platelets 522 (*)    Monocytes Absolute 1.1 (*)    Abs Immature Granulocytes 0.10 (*)    All other components within normal limits  URINALYSIS, ROUTINE W REFLEX MICROSCOPIC - Abnormal; Notable for the following components:   Specific Gravity, Urine >1.046 (*)    Hgb urine dipstick SMALL (*)    Ketones, ur 20 (*)    Bacteria, UA FEW (*)    All other components within normal limits  CULTURE, BLOOD (ROUTINE X 2)  CULTURE, BLOOD (ROUTINE X 2)  LIPASE, BLOOD  BASIC METABOLIC PANEL  MAGNESIUM  CBC    EKG None  Radiology CT CHEST ABDOMEN PELVIS W CONTRAST  Result Date: 06/28/2022 CLINICAL DATA:  Abdominal pain EXAM: CT CHEST, ABDOMEN, AND PELVIS WITH CONTRAST TECHNIQUE: Multidetector CT imaging of the chest, abdomen and pelvis was performed following the standard protocol during bolus administration of intravenous contrast. RADIATION DOSE REDUCTION: This exam was performed according to the departmental dose-optimization program which includes automated exposure control, adjustment of the mA and/or kV according to patient size and/or use of iterative reconstruction technique. CONTRAST:  25mL OMNIPAQUE IOHEXOL 300 MG/ML  SOLN COMPARISON:  CT chest done on 04/06/2022 FINDINGS: CT CHEST FINDINGS Cardiovascular: Coronary artery calcifications are seen. Mediastinum/Nodes:  Subcentimeter nodes in the mediastinum have not changed significantly. Right lobe of thyroid is larger than left. There is inhomogeneous enhancement and coarse calcifications in thyroid. Similar findings were seen on the previous study. Lungs/Pleura: There is 4.5 x 3 cm mixed density lesion in left hilum with no significant interval change. This lesion is inseparable from the left lower lobe bronchus. Small patchy infiltrates are seen in left upper lobe, lingula and left lower lobe with interval worsening. There is ectasia of the bronchi in the lingula and left lower lobe. There is no pleural effusion or pneumothorax. Musculoskeletal: No acute findings are seen in bony structures in the thorax. Deformity in anterolateral aspect of right eighth  rib may suggest healed or healing fracture. CT ABDOMEN PELVIS FINDINGS Hepatobiliary: There is 2 cm low-density in anterior live close to falciform ligament, possibly benign process such as aberrant venous drainage or space-occupying neoplastic process. There is no dilation of bile ducts. Gallbladder is unremarkable. Pancreas: There is minimal prominence of pancreatic duct. No focal abnormalities are seen. Spleen: Spleen appears smaller than usual in size. Adrenals/Urinary Tract: Adrenals are unremarkable. There is no hydronephrosis. Right renal pelvis is prominent in size. There are no renal or ureteral stones. There is a 9 mm low-density in the upper pole of right kidney. There is 1 cm low-density in the posterior midportion of the right kidney. Beam hardening artifacts limit evaluation of the urinary bladder. Stomach/Bowel: Stomach is unremarkable. Small bowel loops are not dilated. Appendix is not seen. There is no significant wall thickening in colon. There is no pericolic stranding. Vascular/Lymphatic: There are scattered atherosclerotic plaques and calcifications in aorta and its major branches. Reproductive: There is inhomogeneous appearance in myometrium suggesting  possible uterine fibroids. Evaluation is limited by beam hardening artifacts. Other: There is no ascites or pneumoperitoneum. Musculoskeletal: No acute findings are seen. There is previous right hip arthroplasty. IMPRESSION: There is a 4.5 x 3 cm central lesion in left hilum consistent with a neoplastic process. There are patchy infiltrates in left mid and left lower lung fields suggesting pneumonia with interval worsening. No new significant lymphadenopathy is seen. There is no evidence of intestinal obstruction or pneumoperitoneum. There is no hydronephrosis. There is 2 cm low-density in anterior aspect of liver close to the falciform ligament which may suggest benign process such as aberrant venous drainage or space-occupying neoplastic process. Short-term follow-up CT along with MRI if warranted may be considered. Arteriosclerosis.  Coronary artery calcifications are seen. Possible small renal cysts. Possible uterine fibroids. There is inhomogeneous attenuation in thyroid. Right lobe of thyroid is larger than left. Other findings as described in the body of the report. Electronically Signed   By: Elmer Picker M.D.   On: 06/28/2022 13:34   CT Head Wo Contrast  Result Date: 06/28/2022 CLINICAL DATA:  History of brain cancer. Right arm and leg weakness for 2 weeks. EXAM: CT HEAD WITHOUT CONTRAST TECHNIQUE: Contiguous axial images were obtained from the base of the skull through the vertex without intravenous contrast. RADIATION DOSE REDUCTION: This exam was performed according to the departmental dose-optimization program which includes automated exposure control, adjustment of the mA and/or kV according to patient size and/or use of iterative reconstruction technique. COMPARISON:  CT head 05/24/2022 FINDINGS: Brain: The 1.8 cm TV x 1.4 cm AP x 2.0 cm cc cystic lesion in the left frontal lobe is not significantly changed in size. Extensive surrounding vasogenic edema is again seen throughout much of the  left cerebral hemisphere which appears overall stable to slightly decreased with slightly decreased rightward midline shift now measuring 7 mm (previously measured 10 mm). The cystic lesion in the right cerebellar hemisphere appears decreased in size common best appreciated in the coronal plane where it measures up to 0.8 cm TV x 0.5 cm cc (previously measured 1.8 cm x 0.9 cm). There is decreased surrounding edema without effacement of fourth ventricle. Previously seen acute blood in the left temporal occipital region is decreased in conspicuity with decreased size of the associated cystic lesion. There is no new acute intracranial hemorrhage or extra-axial fluid collection. There is no evidence of developing hydrocephalus. Vascular: No hyperdense vessel or unexpected calcification. Skull: Normal. Negative for fracture  or focal lesion. Sinuses/Orbits: The imaged paranasal sinuses are clear. The globes and orbits are unremarkable. Other: None. IMPRESSION: 1. Decreased size of the right cerebellar hemisphere and left temporal occipital metastatic lesions with resolved blood products seen on the prior CT. 2. No significant interval change in size of the largest lesion in the left frontal lobe with stable to slightly decreased surrounding edema and slightly decreased rightward midline shift currently measuring 7 mm. 3. No new hemorrhage. Electronically Signed   By: Valetta Mole M.D.   On: 06/28/2022 13:19   DG Chest Port 1 View  Result Date: 06/28/2022 CLINICAL DATA:  Weakness EXAM: PORTABLE CHEST 1 VIEW COMPARISON:  05/23/2022 FINDINGS: Unremarkable cardiac silhouette. No pneumothorax or pleural effusion. Right lung is clear. Left lower hemithorax demonstrates mild diffuse density that may represent a developing pneumonia. IMPRESSION: Possible early pneumonia left base. Otherwise no acute cardiopulmonary process. Electronically Signed   By: Sammie Bench M.D.   On: 06/28/2022 09:05    Procedures .Critical  Care  Performed by: Hayden Rasmussen, MD Authorized by: Hayden Rasmussen, MD   Critical care provider statement:    Critical care time (minutes):  45   Critical care time was exclusive of:  Separately billable procedures and treating other patients   Critical care was necessary to treat or prevent imminent or life-threatening deterioration of the following conditions:  CNS failure or compromise, respiratory failure, dehydration and metabolic crisis   Critical care was time spent personally by me on the following activities:  Development of treatment plan with patient or surrogate, discussions with consultants, evaluation of patient's response to treatment, examination of patient, obtaining history from patient or surrogate, ordering and performing treatments and interventions, ordering and review of laboratory studies, ordering and review of radiographic studies, pulse oximetry and re-evaluation of patient's condition   I assumed direction of critical care for this patient from another provider in my specialty: no       Medications Ordered in ED Medications  gabapentin (NEURONTIN) capsule 300 mg (has no administration in time range)  levETIRAcetam (KEPPRA) tablet 500 mg (500 mg Oral Given 06/28/22 1453)  pantoprazole (PROTONIX) EC tablet 40 mg (40 mg Oral Given 06/28/22 1453)  polyethylene glycol (MIRALAX / GLYCOLAX) packet 17 g (17 g Oral Given 06/28/22 1722)  heparin injection 5,000 Units (has no administration in time range)  0.9 % NaCl with KCl 20 mEq/ L  infusion ( Intravenous New Bag/Given 06/28/22 1507)  acetaminophen (TYLENOL) tablet 650 mg (has no administration in time range)    Or  acetaminophen (TYLENOL) suppository 650 mg (has no administration in time range)  oxyCODONE (Oxy IR/ROXICODONE) immediate release tablet 5 mg (5 mg Oral Given 06/28/22 1455)  fentaNYL (SUBLIMAZE) injection 12.5 mcg (has no administration in time range)  ondansetron (ZOFRAN) tablet 4 mg (has no administration  in time range)    Or  ondansetron (ZOFRAN) injection 4 mg (has no administration in time range)  prochlorperazine (COMPAZINE) injection 10 mg (has no administration in time range)  nicotine (NICODERM CQ - dosed in mg/24 hours) patch 14 mg (has no administration in time range)  ipratropium-albuterol (DUONEB) 0.5-2.5 (3) MG/3ML nebulizer solution 3 mL (3 mLs Nebulization Given 06/28/22 1726)  chlorpheniramine-HYDROcodone (TUSSIONEX) 10-8 MG/5ML suspension 5 mL (has no administration in time range)  guaiFENesin (MUCINEX) 12 hr tablet 600 mg (600 mg Oral Given 06/28/22 1453)  cefTRIAXone (ROCEPHIN) 1 g in sodium chloride 0.9 % 100 mL IVPB (has no administration in time range)  azithromycin (ZITHROMAX) 500 mg in sodium chloride 0.9 % 250 mL IVPB (has no administration in time range)  sodium chloride 0.9 % bolus 500 mL (0 mLs Intravenous Stopped 06/28/22 1330)  ondansetron (ZOFRAN) injection 4 mg (4 mg Intravenous Given 06/28/22 0921)  iohexol (OMNIPAQUE) 300 MG/ML solution 80 mL (80 mLs Intravenous Contrast Given 06/28/22 1302)  cefTRIAXone (ROCEPHIN) 1 g in sodium chloride 0.9 % 100 mL IVPB (0 g Intravenous Stopped 06/28/22 1505)  azithromycin (ZITHROMAX) 500 mg in sodium chloride 0.9 % 250 mL IVPB (500 mg Intravenous New Bag/Given 06/28/22 1447)    ED Course/ Medical Decision Making/ A&P Clinical Course as of 06/28/22 1759  Mon Jun 28, 2022  [id number] Chest x-ray showing possible increased opacity left base.  Awaiting radiology reading. [MB]  54 Gust with Dr. Wynetta Emery [facility] hospitalist who will evaluate patient for admission. [MB]    Clinical Course User Index [MB] Hayden Rasmussen, MD                             Medical Decision Making Amount and/or Complexity of Data Reviewed Labs: ordered. Radiology: ordered.  Risk Prescription drug management. Decision regarding hospitalization.   This patient complains of continued weakness right side, not eating or drinking, nausea, short of breath; this  involves an extensive number of treatment Options and is a complaint that carries with it a high risk of complications and morbidity. The differential includes stroke, bleed, failure to thrive, dehydration, pneumonia, aspiration  I ordered, reviewed and interpreted labs, which included CBC with normal white count stable hemoglobin, chemistries with low potassium, urinalysis without signs of infection, blood culture sent, I ordered medication IV fluids IV antibiotics nausea medication and reviewed PMP when indicated. I ordered imaging studies which included chest x-ray, CT head chest abdomen pelvis and I independently    visualized and interpreted imaging which showed stable mets, new signs of possible pneumonia Additional history obtained from patient's family members including her husband Previous records obtained and reviewed in epic including recent discharge summary I consulted Dr. Wynetta Emery [facility] hospitalist and discussed lab and imaging findings and discussed disposition.  Cardiac monitoring reviewed, sinus rhythm Social determinants considered, ongoing tobacco use decreased physical activity  Critical Interventions: Initiation of IV fluids and antibiotics for patient's symptoms.  After the interventions stated above, I reevaluated the patient and found resting quietly no distress Admission and further testing considered, she would benefit from mission to the hospital for further stabilization and further discussion of goals of care.  Patient in agreement with plan for admission.         Final Clinical Impression(s) / ED Diagnoses Final diagnoses:  Community acquired pneumonia, unspecified laterality  Primary malignant neoplasm of lung metastatic to other site, unspecified laterality  Failure to thrive in adult    Rx / DC Orders ED Discharge Orders     None         Hayden Rasmussen, MD 06/28/22 [phone]

## 2022-06-28 NOTE — ED Triage Notes (Signed)
Pt c/o right arm and leg weakness x 2 weeks.

## 2022-06-28 NOTE — H&P (Signed)
History and Physical  [facility]  Sandra Horne [id number] DOB: Oct 10, 1955 DOA: 06/28/2022  PCP: Pcp, No  Patient coming from: Home  Level of care: Med-Surg  I have personally briefly reviewed patient's old medical records in [place]  Chief Complaint: right arm weakness   HPI: Sandra Horne is a 67 year old female with stage IV non-small cell lung cancer, adenocarcinoma diagnosed in December 2021 presented with left hilar/infrahilar mass in addition to left hilar and mediastinal lymphadenopathy and metastatic brain metastasis.  She had facial droop and right-sided weakness.  She is status post SRS to multiple brain metastasis and currently undergoing radiation with plan for 2 more treatments.  She had declined to proceed with any systemic therapy and also declined immunotherapy despite being told by oncology that her disease could rapidly progress.  She saw Dr. Earlie Server in November 2023 and he had recommended patient to follow-up with palliative care.  She was admitted twice in February 2024.  MRI brain showed likely new metastatic lesions and hospice was recommended at that time but patient wanted to continue radiation and remain full code.  She was admitted again on 05/23/2022 with right-sided weakness and CT head showed new hemorrhage in posterior left occipital lobe, metastatic lesion with extensive vasogenic edema and rightward midline shift measuring up to 1 cm.  Neurosurgery recommended transfer to [facility] and repeat CT in 24 hours.  She remained stable and was then transferred to [facility] to complete her 10th dose of radiation treatment.  She was discharged on a prolonged taper of Decadron and Keppra.  SNF was recommended however she decided to go home with family and have home health PT OT.  Patient has had multiple palliative consultations and goals of care discussions however family has been very resistant and wanting patient to remain full code and  continue radiation treatments.  Patient reports that she has 2 remaining radiation treatments left.  Patient again returns to emergency room today brought in by husband complaining of right arm and leg weakness for 2 weeks.  Patient reports that she has not been able to eat for the past 2 weeks.  She has been drinking liquids.  She has been very nauseous.  She has had some cough and occasional shortness of breath symptoms.  No hemoptysis.  She was sent for CT head today with no findings of hemorrhage and there was no change in the largest lesion but some of the lesions are smaller in size.  She also had a CT of the chest abdomen and pelvis with findings concerning for pneumonia.  Other malignant findings are stable.  Patient is being admitted for treatment of pneumonia.  She also needs a palliative medicine discussion for further goals of care as she has continued to decline with poor oral intake over the past 2 weeks. She is tearful and upset saying that she "has been thru so much" over past weeks.       Past Medical History:  Diagnosis Date   Cancer    Lung and Brain mets    Past Surgical History:  Procedure Laterality Date   BRONCHIAL BRUSHINGS  03/26/2021   Procedure: BRONCHIAL BRUSHINGS;  Surgeon: Margaretha Seeds, MD;  Location: [facility];  Service: Cardiopulmonary;;   BRONCHIAL NEEDLE ASPIRATION BIOPSY  03/26/2021   Procedure: BRONCHIAL NEEDLE ASPIRATION BIOPSIES;  Surgeon: Margaretha Seeds, MD;  Location: [facility];  Service: Cardiopulmonary;;   BRONCHIAL WASHINGS  03/26/2021   Procedure: BRONCHIAL WASHINGS;  Surgeon: Margaretha Seeds, MD;  Location: [facility];  Service: Cardiopulmonary;;   ECTOPIC PREGNANCY SURGERY     ENDOBRONCHIAL ULTRASOUND Bilateral 03/26/2021   Procedure: ENDOBRONCHIAL ULTRASOUND;  Surgeon: Margaretha Seeds, MD;  Location: [facility];  Service: Cardiopulmonary;  Laterality: Bilateral;   HEMOSTASIS CONTROL  03/26/2021   Procedure: HEMOSTASIS  CONTROL;  Surgeon: Margaretha Seeds, MD;  Location: [facility];  Service: Cardiopulmonary;;   HIP ARTHROPLASTY Right 10/13/2021   Procedure: ARTHROPLASTY BIPOLAR HIP (HEMIARTHROPLASTY);  Surgeon: Carole Civil, MD;  Location: [facility];  Service: Orthopedics;  Laterality: Right;     reports that she has been smoking cigarettes. She has never used smokeless tobacco. She reports that she does not currently use alcohol. She reports that she does not currently use drugs after having used the following drugs: Marijuana.  No Known Allergies  Family History  Problem Relation Age of Onset   Hypertension Mother    Stroke Mother    Hypertension Father    Cancer Sister    Cancer Paternal Uncle     Prior to Admission medications   Medication Sig Start Date End Date Taking? Authorizing Provider  gabapentin (NEURONTIN) 300 MG capsule Take 1 capsule (300 mg total) by mouth 3 (three) times daily. Patient taking differently: Take 300 mg by mouth 3 (three) times daily as needed (for pain). 01/13/22   Carole Civil, MD  levETIRAcetam (KEPPRA) 500 MG tablet Take 1 tablet (500 mg total) by mouth 2 (two) times daily. 05/12/22 08/10/22  Shelly Coss, MD  naproxen (NAPROSYN) 375 MG tablet Take 1 tablet (375 mg total) by mouth every 12 (twelve) hours as needed. 05/12/22   Shelly Coss, MD  pantoprazole (PROTONIX) 40 MG tablet Take 1 tablet (40 mg total) by mouth daily. 05/12/22   Shelly Coss, MD  polyethylene glycol (MIRALAX / GLYCOLAX) 17 g packet Take 17 g by mouth daily. 05/12/22   Shelly Coss, MD    Physical Exam: Vitals:   06/28/22 1230 06/28/22 1258 06/28/22 1300 06/28/22 1330  BP: 113/70  110/71 108/66  Pulse:    79  Resp: 18  17 17   Temp:  98.3 F (36.8 C)    TempSrc:  Oral    SpO2:    100%  Weight:      Height:        Constitutional: cachectic emaciated appearing female, NAD, calm, comfortable, tearful and crying Eyes: PERRL, lids and conjunctivae normal ENMT: Mucous  membranes are dry. Posterior pharynx clear of any exudate or lesions. Normal dentition.  Neck: normal, supple, no masses, no thyromegaly Respiratory: clear to auscultation bilaterally, no wheezing, no crackles. Normal respiratory effort. No accessory muscle use.  Cardiovascular: normal s1, s2 sounds, no murmurs / rubs / gallops. No extremity edema. 2+ pedal pulses. No carotid bruits.  Abdomen: no tenderness, no masses palpated. No hepatosplenomegaly. Bowel sounds positive.  Musculoskeletal: diffuse clubbing seen in fingers.  No joint deformity upper and lower extremities. Good ROM, no contractures. Normal muscle tone.  Skin: no rashes, lesions, ulcers. No induration Neurologic: CN 2-12 grossly intact. Sensation intact, DTR normal. Strength 5/5 in all 4.  Psychiatric: Normal judgment and insight. Alert and oriented x 3. Depressed mood.   Labs on Admission: I have personally reviewed following labs and imaging studies  CBC: Recent Labs  Lab 06/28/22 0914  WBC 8.1  NEUTROABS 5.3  HGB 11.4*  HCT 35.4*  MCV 88.1  PLT [id number]*   Basic Metabolic Panel: Recent Labs  Lab 06/28/22 0914  NA 137  K 3.3*  CL 101  CO2 24  GLUCOSE 87  BUN 5*  CREATININE 0.66  CALCIUM 8.6*   GFR: Estimated Creatinine Clearance: 60.5 mL/min (by C-G formula based on SCr of 0.66 mg/dL). Liver Function Tests: Recent Labs  Lab 06/28/22 0914  AST 16  ALT 11  ALKPHOS 70  BILITOT 0.9  PROT 6.6  ALBUMIN 2.8*   Recent Labs  Lab 06/28/22 0914  LIPASE 24   No results for input(s): "AMMONIA" in the last 168 hours. Coagulation Profile: No results for input(s): "INR", "PROTIME" in the last 168 hours. Cardiac Enzymes: No results for input(s): "CKTOTAL", "CKMB", "CKMBINDEX", "TROPONINI" in the last 168 hours. BNP (last 3 results) No results for input(s): "PROBNP" in the last 8760 hours. HbA1C: No results for input(s): "HGBA1C" in the last 72 hours. CBG: No results for input(s): "GLUCAP" in the last 168  hours. Lipid Profile: No results for input(s): "CHOL", "HDL", "LDLCALC", "TRIG", "CHOLHDL", "LDLDIRECT" in the last 72 hours. Thyroid Function Tests: No results for input(s): "TSH", "T4TOTAL", "FREET4", "T3FREE", "THYROIDAB" in the last 72 hours. Anemia Panel: No results for input(s): "VITAMINB12", "FOLATE", "FERRITIN", "TIBC", "IRON", "RETICCTPCT" in the last 72 hours. Urine analysis:    Component Value Date/Time   COLORURINE YELLOW 05/08/2022 2203   APPEARANCEUR CLEAR 05/08/2022 2203   LABSPEC 1.013 05/08/2022 2203   PHURINE 6.0 05/08/2022 2203   GLUCOSEU NEGATIVE 05/08/2022 2203   HGBUR NEGATIVE 05/08/2022 2203   BILIRUBINUR NEGATIVE 05/08/2022 2203   KETONESUR NEGATIVE 05/08/2022 2203   PROTEINUR NEGATIVE 05/08/2022 2203   NITRITE NEGATIVE 05/08/2022 2203   LEUKOCYTESUR NEGATIVE 05/08/2022 2203    Radiological Exams on Admission: CT CHEST ABDOMEN PELVIS W CONTRAST  Result Date: 06/28/2022 CLINICAL DATA:  Abdominal pain EXAM: CT CHEST, ABDOMEN, AND PELVIS WITH CONTRAST TECHNIQUE: Multidetector CT imaging of the chest, abdomen and pelvis was performed following the standard protocol during bolus administration of intravenous contrast. RADIATION DOSE REDUCTION: This exam was performed according to the departmental dose-optimization program which includes automated exposure control, adjustment of the mA and/or kV according to patient size and/or use of iterative reconstruction technique. CONTRAST:  34mL OMNIPAQUE IOHEXOL 300 MG/ML  SOLN COMPARISON:  CT chest done on 04/06/2022 FINDINGS: CT CHEST FINDINGS Cardiovascular: Coronary artery calcifications are seen. Mediastinum/Nodes: Subcentimeter nodes in the mediastinum have not changed significantly. Right lobe of thyroid is larger than left. There is inhomogeneous enhancement and coarse calcifications in thyroid. Similar findings were seen on the previous study. Lungs/Pleura: There is 4.5 x 3 cm mixed density lesion in left hilum with no  significant interval change. This lesion is inseparable from the left lower lobe bronchus. Small patchy infiltrates are seen in left upper lobe, lingula and left lower lobe with interval worsening. There is ectasia of the bronchi in the lingula and left lower lobe. There is no pleural effusion or pneumothorax. Musculoskeletal: No acute findings are seen in bony structures in the thorax. Deformity in anterolateral aspect of right eighth rib may suggest healed or healing fracture. CT ABDOMEN PELVIS FINDINGS Hepatobiliary: There is 2 cm low-density in anterior live close to falciform ligament, possibly benign process such as aberrant venous drainage or space-occupying neoplastic process. There is no dilation of bile ducts. Gallbladder is unremarkable. Pancreas: There is minimal prominence of pancreatic duct. No focal abnormalities are seen. Spleen: Spleen appears smaller than usual in size. Adrenals/Urinary Tract: Adrenals are unremarkable. There is no hydronephrosis. Right renal pelvis is prominent in size. There are no renal or  ureteral stones. There is a 9 mm low-density in the upper pole of right kidney. There is 1 cm low-density in the posterior midportion of the right kidney. Beam hardening artifacts limit evaluation of the urinary bladder. Stomach/Bowel: Stomach is unremarkable. Small bowel loops are not dilated. Appendix is not seen. There is no significant wall thickening in colon. There is no pericolic stranding. Vascular/Lymphatic: There are scattered atherosclerotic plaques and calcifications in aorta and its major branches. Reproductive: There is inhomogeneous appearance in myometrium suggesting possible uterine fibroids. Evaluation is limited by beam hardening artifacts. Other: There is no ascites or pneumoperitoneum. Musculoskeletal: No acute findings are seen. There is previous right hip arthroplasty. IMPRESSION: There is a 4.5 x 3 cm central lesion in left hilum consistent with a neoplastic process.  There are patchy infiltrates in left mid and left lower lung fields suggesting pneumonia with interval worsening. No new significant lymphadenopathy is seen. There is no evidence of intestinal obstruction or pneumoperitoneum. There is no hydronephrosis. There is 2 cm low-density in anterior aspect of liver close to the falciform ligament which may suggest benign process such as aberrant venous drainage or space-occupying neoplastic process. Short-term follow-up CT along with MRI if warranted may be considered. Arteriosclerosis.  Coronary artery calcifications are seen. Possible small renal cysts. Possible uterine fibroids. There is inhomogeneous attenuation in thyroid. Right lobe of thyroid is larger than left. Other findings as described in the body of the report. Electronically Signed   By: Elmer Picker M.D.   On: 06/28/2022 13:34   CT Head Wo Contrast  Result Date: 06/28/2022 CLINICAL DATA:  History of brain cancer. Right arm and leg weakness for 2 weeks. EXAM: CT HEAD WITHOUT CONTRAST TECHNIQUE: Contiguous axial images were obtained from the base of the skull through the vertex without intravenous contrast. RADIATION DOSE REDUCTION: This exam was performed according to the departmental dose-optimization program which includes automated exposure control, adjustment of the mA and/or kV according to patient size and/or use of iterative reconstruction technique. COMPARISON:  CT head 05/24/2022 FINDINGS: Brain: The 1.8 cm TV x 1.4 cm AP x 2.0 cm cc cystic lesion in the left frontal lobe is not significantly changed in size. Extensive surrounding vasogenic edema is again seen throughout much of the left cerebral hemisphere which appears overall stable to slightly decreased with slightly decreased rightward midline shift now measuring 7 mm (previously measured 10 mm). The cystic lesion in the right cerebellar hemisphere appears decreased in size common best appreciated in the coronal plane where it measures  up to 0.8 cm TV x 0.5 cm cc (previously measured 1.8 cm x 0.9 cm). There is decreased surrounding edema without effacement of fourth ventricle. Previously seen acute blood in the left temporal occipital region is decreased in conspicuity with decreased size of the associated cystic lesion. There is no new acute intracranial hemorrhage or extra-axial fluid collection. There is no evidence of developing hydrocephalus. Vascular: No hyperdense vessel or unexpected calcification. Skull: Normal. Negative for fracture or focal lesion. Sinuses/Orbits: The imaged paranasal sinuses are clear. The globes and orbits are unremarkable. Other: None. IMPRESSION: 1. Decreased size of the right cerebellar hemisphere and left temporal occipital metastatic lesions with resolved blood products seen on the prior CT. 2. No significant interval change in size of the largest lesion in the left frontal lobe with stable to slightly decreased surrounding edema and slightly decreased rightward midline shift currently measuring 7 mm. 3. No new hemorrhage. Electronically Signed   By: Court Joy.D.  On: 06/28/2022 13:19   DG Chest Port 1 View  Result Date: 06/28/2022 CLINICAL DATA:  Weakness EXAM: PORTABLE CHEST 1 VIEW COMPARISON:  05/23/2022 FINDINGS: Unremarkable cardiac silhouette. No pneumothorax or pleural effusion. Right lung is clear. Left lower hemithorax demonstrates mild diffuse density that may represent a developing pneumonia. IMPRESSION: Possible early pneumonia left base. Otherwise no acute cardiopulmonary process. Electronically Signed   By: Sammie Bench M.D.   On: 06/28/2022 09:05    EKG: Independently reviewed.   Assessment/Plan Principal Problem:   Pneumonia Active Problems:   Primary cancer of left lower lobe of lung   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 [facility])   Malignant neoplasm metastatic to brain   Rt Sided Weakness due to Vasogenic Brain edema   Tobacco abuse    Hypokalemia   Right sided weakness   Hypoalbuminemia   Anemia in neoplastic disease   Thrombocytosis   Pneumonia  - Noted on CT chest  - pt is immunocompromised and will admit for IV antibiotic treatment  - continue IV antibiotics as ordered - continue supportive measures as ordered - mucolytics, bronchodilators and cough suppressants ordered   Hypokalemia - supplement magnesium and potassium - recheck in AM   Thrombocytosis  - reactive, recheck in AM   Stage IV NSC adenocarcinoma of lungs - pt opted out of systemic treatments and immunotherapy - currently receiving radiation treatments  - precipitous decline in last 2 weeks - pt is tearful and crying out today on admission to hospital  - recommending ongoing palliative discussions regarding goals of care and code status  Right sided weakness due to brains mets, vasogenic edema  - resume keppra 500 mg BID - did not bring home meds and patient and family do not know what meds she is taking - asked to please bring meds to have home meds reconciled - I'm not sure if she is still on dexamethasone or not   Tobacco  - nicotine patch prn   Anemia in neoplastic disease - anticipate Hg will trend down from hemodilution - recheck CBC in AM   Hypoalbuminemia  - dietician consultation  - pt only tolerating liquid diet   Severe protein calorie malnutrition  - consult dietitian  Nausea and vomiting - antinausea medication ordered    DVT prophylaxis: Wilson heparin   Code Status: full   Family Communication: not present when I rounded  Disposition Plan: TBD   Consults called: palliative care   Admission status: INP  Level of care: Med-Surg Irwin Brakeman MD [facility] How to contact the [facility] Attending or Consulting provider 7A - 7P or covering provider during after hours 7P -7A, for this patient?  Check the care team in [facility] and look for a) attending/consulting [facility] provider listed and b) the [facility] 2 team listed Log into  [email or web address] and use [place]'s universal password to access. If you do not have the password, please contact the hospital operator. Locate the [facility] provider you are looking for under [facility] and page to a number that you can be directly reached. If you still have difficulty reaching the provider, please page the [facility] (Director on Call) for the Hospitalists listed on amion for assistance.   If 7PM-7AM, please contact night-coverage [email or web address] Password [email or web address]  06/28/2022, 3:00 PM

## 2022-06-28 NOTE — Hospital Course (Signed)
67 year old female with stage IV non-small cell lung cancer, adenocarcinoma diagnosed in December 2021 presented with left hilar/infrahilar mass in addition to left hilar and mediastinal lymphadenopathy and metastatic brain metastasis.  She had facial droop and right-sided weakness.  She is status post SRS to multiple brain metastasis and currently undergoing radiation with plan for 2 more treatments.  She had declined to proceed with any systemic therapy and also declined immunotherapy despite being told by oncology that her disease could rapidly progress.  She saw Dr. Earlie Server in November 2023 and he had recommended patient to follow-up with palliative care.  She was admitted twice in February 2024.  MRI brain showed likely new metastatic lesions and hospice was recommended at that time but patient wanted to continue radiation and remain full code.  She was admitted again on 05/23/2022 with right-sided weakness and CT head showed new hemorrhage in posterior left occipital lobe, metastatic lesion with extensive vasogenic edema and rightward midline shift measuring up to 1 cm.  Neurosurgery recommended transfer to [facility] and repeat CT in 24 hours.  She remained stable and was then transferred to [facility] to complete her 10th dose of radiation treatment.  She was discharged on a prolonged taper of Decadron and Keppra.  SNF was recommended however she decided to go home with family and have home health PT OT.  Patient has had multiple palliative consultations and goals of care discussions however family has been very resistant and wanting patient to remain full code and continue radiation treatments.  Patient reports that she has 2 remaining radiation treatments left.  Patient again returns to emergency room today brought in by husband complaining of right arm and leg weakness for 2 weeks.  Patient reports that she has not been able to eat for the past 2 weeks.  She has been drinking liquids.  She  has been very nauseous.  She has had some cough and occasional shortness of breath symptoms.  No hemoptysis.  She was sent for CT head today with no findings of hemorrhage and there was no change in the largest lesion but some of the lesions are smaller in size.  She also had a CT of the chest abdomen and pelvis with findings concerning for pneumonia.  Other malignant findings are stable.  Patient is being admitted for treatment of pneumonia.  She also needs a palliative medicine discussion for further goals of care as she has continued to decline with poor oral intake over the past 2 weeks. She is tearful and upset saying that she "has been thru so much" over past weeks.     On 06/29/2022, Dr. Irwin Brakeman spoke with radiation oncology, Dr. Tyler Pita, who offered palliative radiation treatments directed to the tumor in the lung.  On 06/30/2022, the patient was agreeable to transfer to [facility] for radiation.

## 2022-06-29 ENCOUNTER — Encounter (HOSPITAL_COMMUNITY): Payer: Self-pay | Admitting: Family Medicine

## 2022-06-29 DIAGNOSIS — C3490 Malignant neoplasm of unspecified part of unspecified bronchus or lung: Secondary | ICD-10-CM | POA: Diagnosis not present

## 2022-06-29 DIAGNOSIS — C7931 Secondary malignant neoplasm of brain: Secondary | ICD-10-CM | POA: Diagnosis not present

## 2022-06-29 DIAGNOSIS — E43 Unspecified severe protein-calorie malnutrition: Secondary | ICD-10-CM | POA: Diagnosis not present

## 2022-06-29 DIAGNOSIS — R531 Weakness: Secondary | ICD-10-CM

## 2022-06-29 DIAGNOSIS — Z515 Encounter for palliative care: Secondary | ICD-10-CM

## 2022-06-29 DIAGNOSIS — Z7189 Other specified counseling: Secondary | ICD-10-CM | POA: Diagnosis not present

## 2022-06-29 DIAGNOSIS — J189 Pneumonia, unspecified organism: Secondary | ICD-10-CM | POA: Diagnosis not present

## 2022-06-29 LAB — CBC
HCT: 30.9 % — ABNORMAL LOW (ref 36.0–46.0)
Hemoglobin: 9.7 g/dL — ABNORMAL LOW (ref 12.0–15.0)
MCH: 27.9 pg (ref 26.0–34.0)
MCHC: 31.4 g/dL (ref 30.0–36.0)
MCV: 88.8 fL (ref 80.0–100.0)
Platelets: 447 10*3/uL — ABNORMAL HIGH (ref 150–400)
RBC: 3.48 MIL/uL — ABNORMAL LOW (ref 3.87–5.11)
RDW: 15.9 % — ABNORMAL HIGH (ref 11.5–15.5)
WBC: 6.4 10*3/uL (ref 4.0–10.5)
nRBC: 0 % (ref 0.0–0.2)

## 2022-06-29 LAB — BASIC METABOLIC PANEL
Anion gap: 7 (ref 5–15)
BUN: <5 mg/dL — ABNORMAL LOW (ref 8–23)
CO2: 24 mmol/L (ref 22–32)
Calcium: 8 mg/dL — ABNORMAL LOW (ref 8.9–10.3)
Chloride: 107 mmol/L (ref 98–111)
Creatinine, Ser: 0.59 mg/dL (ref 0.44–1.00)
GFR, Estimated: >60 mL/min (ref >60)
Glucose, Bld: 78 mg/dL (ref 70–99)
Potassium: 3.4 mmol/L — ABNORMAL LOW (ref 3.5–5.1)
Sodium: 138 mmol/L (ref 135–145)

## 2022-06-29 LAB — MAGNESIUM: Magnesium: 1.7 mg/dL (ref 1.7–2.4)

## 2022-06-29 MED ORDER — MAGNESIUM SULFATE 4 GM/100ML IV SOLN
4.0000 g | Freq: Once | INTRAVENOUS | Status: AC
  Administered 2022-06-29: 4 g via INTRAVENOUS
  Filled 2022-06-29: qty 100

## 2022-06-29 NOTE — TOC Initial Note (Addendum)
Transition of Care [facility]) - Initial/Assessment Note    Patient Details  Name: Sandra Horne MRN: [id number] Date of Birth: [birth year]  Transition of Care [facility]) CM/SW Contact:    Ihor Gully, LCSW Phone Number: 06/29/2022, 11:30 AM  Clinical Narrative:                 Patient from home with daughter/granddaughters. Known to TOC due to admissions.  Most recently assessed in Feb. Declined SNF in Feb. Has hospital bed, wc, [place], BSC in home.  At Feb. D/c patient was referred to [facility] with Alvis Lemmings, however Alvis Lemmings was unable to make contact with her after multiple attempts and services were not initiated.   Expected Discharge Plan: Home/Self Care Barriers to Discharge: Continued Medical Work up   Patient Goals and CMS Choice            Expected Discharge Plan and Services                                              Prior Living Arrangements/Services   Lives with:: Adult Children              Current home services: DME    Activities of Daily Living Home Assistive Devices/Equipment: None ADL Screening (condition at time of admission) Patient's cognitive ability adequate to safely complete daily activities?: Yes Is the patient deaf or have difficulty hearing?: Yes Does the patient have difficulty seeing, even when wearing glasses/contacts?: No Does the patient have difficulty concentrating, remembering, or making decisions?: No Patient able to express need for assistance with ADLs?: Yes Does the patient have difficulty dressing or bathing?: No Independently performs ADLs?: Yes (appropriate for developmental age) Does the patient have difficulty walking or climbing stairs?: Yes Weakness of Legs: None Weakness of Arms/Hands: None  Permission Sought/Granted                  Emotional Assessment         Alcohol / Substance Use: Not Applicable Psych Involvement: No (comment)  Admission diagnosis:  Pneumonia [J18.9] Patient Active Problem List    Diagnosis Date Noted   Pneumonia 06/28/2022   Hypoalbuminemia 06/28/2022   Anemia in neoplastic disease 06/28/2022   Thrombocytosis 06/28/2022   Intracranial hemorrhage 05/23/2022   Palliative care encounter 05/11/2022   Pain 05/11/2022   Right sided weakness 05/11/2022   Cerebral edema 05/11/2022   Metastasis to brain 05/11/2022   Goals of care, counseling/discussion 05/11/2022   Hypokalemia 05/08/2022   Lung cancer metastatic to brain 05/08/2022   Thrombocytopenia 05/08/2022   Malnutrition of moderate degree 11/02/2021   Rt Sided Weakness due to Vasogenic Brain edema 10/31/2021   Tobacco abuse 10/31/2021   Closed displaced fracture of right femoral neck 10/12/2021   Malignant neoplasm metastatic to brain 03/27/2021   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 ([place]) 03/26/2021   Acute encephalopathy 03/25/2021   Primary cancer of left lower lobe of lung 01/14/2020   CLOSED FRACTURE OF UNSPECIFIED PART OF HUMERUS 04/01/2010   PCP:  Merryl Hacker No Pharmacy:   [facility] [place] - [address] [place] [address] Phone: [phone] Fax: [phone]     Social Determinants of Health (SDOH) Social History: SDOH Screenings   Food Insecurity: No Food Insecurity (05/24/2022)  Housing: Low Risk  (05/24/2022)  Transportation Needs: No Transportation Needs (05/24/2022)  Recent Concern: Transportation Needs - Unmet Transportation Needs (05/08/2022)  Utilities: Not At Risk (05/24/2022)  Financial Resource Strain: Low Risk  (01/14/2020)  Physical Activity: Inactive (01/14/2020)  Social Connections: Socially Isolated (01/14/2020)  Stress: No Stress Concern Present (01/14/2020)  Tobacco Use: High Risk (06/28/2022)   SDOH Interventions:     Readmission Risk Interventions    05/26/2022   12:55 PM 10/14/2021   10:00 AM  Readmission Risk Prevention Plan  Transportation Screening Complete Complete  PCP or Specialist  Appt within 5-7 Days Complete Not Complete  Home Care Screening Complete Complete  Medication Review (RN CM) Complete Complete

## 2022-06-29 NOTE — Progress Notes (Signed)
Initial Nutrition Assessment  DOCUMENTATION CODES:   Severe malnutrition in context of chronic illness  INTERVENTION:  Ensure Enlive po TID, each supplement provides 350 kcal and 20 grams of protein.   Magic cup with meals (290 kcal, 9 gr protein)  Request weight  NUTRITION DIAGNOSIS:   Severe Malnutrition related to cancer and cancer related treatments as evidenced by nausea, energy intake < or equal to 75% for > or equal to 1 month, severe muscle loss and wt loss of 17% x 2 months.   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 Patient was hospitalized twice in February per MD. Presents with right arm weakness and is being treated for pneumonia.   Patient is feeling weak and says she has not been eating the past 2 weeks and feeling nauseated. Her intake has consisted mainly of various juices, water and Ensure. Patient is followed at [facility] for cancer treatment. Palliative  radiation treatment 2/5->05/25/22.    Patient with acute weight loss 17% x 2 months which is severe change. Currently 54.9 kg.   Medications reviewed and include: Miralax, Protonix  IV- antibiotics     Latest Ref Rng & Units 06/29/2022    4:56 AM 06/28/2022    9:14 AM 05/24/2022    3:56 AM  BMP  Glucose 70 - 99 mg/dL 78  87  116   BUN 8 - 23 mg/dL 5  5  8    Creatinine 0.44 - 1.00 mg/dL 0.59  0.66  0.72   Sodium 135 - 145 mmol/L 138  137  138   Potassium 3.5 - 5.1 mmol/L 3.4  3.3  4.2   Chloride 98 - 111 mmol/L 107  101  106   CO2 22 - 32 mmol/L 24  24  21    Calcium 8.9 - 10.3 mg/dL  8.0  8.6  8.6      NUTRITION - FOCUSED PHYSICAL EXAM:  Flowsheet Row Most Recent Value  Orbital Region Mild depletion  Upper Arm Region Moderate depletion  Thoracic and Lumbar Region Moderate depletion  Buccal Region No depletion  Temple Region Moderate depletion  Clavicle Bone Region Moderate depletion  Clavicle and Acromion Bone Region Severe depletion  Scapular Bone Region Moderate depletion  Dorsal Hand Mild depletion  Patellar Region Moderate depletion  Anterior Thigh Region Severe depletion  Posterior Calf Region Severe depletion  Edema (RD Assessment) Moderate  Hair Reviewed  Eyes Reviewed  Mouth Reviewed  Skin Reviewed  Nails Reviewed       Diet Order:   Diet Order             Diet full liquid Room service appropriate? Yes; Fluid consistency: Thin  Diet effective now                   EDUCATION NEEDS:  Education needs have been addressed  Skin:  Skin Assessment: Reviewed RN Assessment  Last BM:  constipation per chart - last BM was last week  Height:   Ht Readings from Last 1 Encounters:  06/28/22 5\' 4"  (1.626 m)    Weight:  Wt Readings from Last 1 Encounters:  06/28/22 54.9 kg    Ideal Body Weight:   55 kg  BMI:  Body mass index is 20.79 kg/m.  Estimated Nutritional Needs:   Kcal:  1600-1750  Protein:  80-88 gr  Fluid:  >1500 ml daily   Colman Cater MS,RD,CSG,LDN Contact: Shea Evans

## 2022-06-29 NOTE — Progress Notes (Signed)
PROGRESS NOTE   Sandra Horne Nephew  [id number] DOB: July 19, 1955 DOA: 06/28/2022 PCP: Pcp, No   Chief Complaint  Patient presents with   Numbness   Level of care: Med-Surg  Brief Admission History:  67 year old female with stage IV non-small cell lung cancer, adenocarcinoma diagnosed in December 2021 presented with left hilar/infrahilar mass in addition to left hilar and mediastinal lymphadenopathy and metastatic brain metastasis.  She had facial droop and right-sided weakness.  She is status post SRS to multiple brain metastasis and currently undergoing radiation with plan for 2 more treatments.  She had declined to proceed with any systemic therapy and also declined immunotherapy despite being told by oncology that her disease could rapidly progress.  She saw Dr. Earlie Server in November 2023 and he had recommended patient to follow-up with palliative care.  She was admitted twice in February 2024.  MRI brain showed likely new metastatic lesions and hospice was recommended at that time but patient wanted to continue radiation and remain full code.  She was admitted again on 05/23/2022 with right-sided weakness and CT head showed new hemorrhage in posterior left occipital lobe, metastatic lesion with extensive vasogenic edema and rightward midline shift measuring up to 1 cm.  Neurosurgery recommended transfer to [facility] and repeat CT in 24 hours.  She remained stable and was then transferred to [facility] to complete her 10th dose of radiation treatment.  She was discharged on a prolonged taper of Decadron and Keppra.  SNF was recommended however she decided to go home with family and have home health PT OT.  Patient has had multiple palliative consultations and goals of care discussions however family has been very resistant and wanting patient to remain full code and continue radiation treatments.  Patient reports that she has 2 remaining radiation treatments left.  Patient again returns  to emergency room today brought in by husband complaining of right arm and leg weakness for 2 weeks.  Patient reports that she has not been able to eat for the past 2 weeks.  She has been drinking liquids.  She has been very nauseous.  She has had some cough and occasional shortness of breath symptoms.  No hemoptysis.  She was sent for CT head today with no findings of hemorrhage and there was no change in the largest lesion but some of the lesions are smaller in size.  She also had a CT of the chest abdomen and pelvis with findings concerning for pneumonia.  Other malignant findings are stable.  Patient is being admitted for treatment of pneumonia.  She also needs a palliative medicine discussion for further goals of care as she has continued to decline with poor oral intake over the past 2 weeks. She is tearful and upset saying that she "has been thru so much" over past weeks.       Assessment and Plan:   Postobstructive Pneumonia  - Noted on CT chest  - pt is immunocompromised and will admit for IV antibiotic treatment  - continue IV antibiotics as ordered - continue supportive measures as ordered - mucolytics, bronchodilators and cough suppressants ordered  - I communicated with radiation oncologist Dr. Tammi Klippel, he is offering to give 10 palliative radiation treatments directed to tumor in lung if patient is agreeable and would want patient transferred to [facility].  I spoke with patient and she needs time to decide and speak with her family.  She will let us know tomorrow 4/3 if she decides to transfer  to [facility] for the treatments.    Hypokalemia - supplement magnesium and potassium - recheck in AM    Thrombocytosis  - reactive, recheck in AM    Stage IV NSC adenocarcinoma of lungs - pt opted out of systemic treatments and immunotherapy - currently receiving radiation treatments  - precipitous decline in last 2 weeks - pt is tearful and crying out today on admission to hospital   - recommending ongoing palliative discussions regarding goals of care and code status   Right sided weakness due to brains mets, vasogenic edema  - resume keppra 500 mg BID - did not bring home meds and patient and family do not know what meds she is taking - asked to please bring meds to have home meds reconciled - I'm not sure if she is still on dexamethasone or not    Tobacco  - nicotine patch prn    Anemia in neoplastic disease - anticipate Hg will trend down from hemodilution - recheck CBC in AM    Hypoalbuminemia  - dietician consultation  - pt only tolerating liquid diet    Severe protein calorie malnutrition  - consult dietitian   Nausea and vomiting - antinausea medication ordered    DVT prophylaxis: Edom heparin Code Status: Full  Family Communication:  Disposition: Status is: Inpatient    Consultants:  Palliative care Radiation oncology ([place]) Procedures:   Antimicrobials:  Ceftriaxone 4/1>> Azithromycin 4/1>>   Subjective: Pt says maybe "a little better" today but still not eating and having no appetite.  She continues losing weight.  She feels like radiation treatments are causing her to lose her appetite.    Objective: Vitals:   06/29/22 0639 06/29/22 0922 06/29/22 1321 06/29/22 1509  BP: (!) 91/59   101/60  Pulse: 79   86  Resp: 18   17  Temp: 98.4 F (36.9 C)   98.2 F (36.8 C)  TempSrc: Oral   Oral  SpO2: 99% 99% 98% 94%  Weight:      Height:        Intake/Output Summary (Last 24 hours) at 06/29/2022 1705 Last data filed at 06/29/2022 1500 Gross per 24 hour  Intake 2411.97 ml  Output --  Net 2411.97 ml   Filed Weights   06/28/22 0836  Weight: 54.9 kg   Examination:  General exam: cachectic and emaciated appearing female, pleasant and kind, Appears calm and comfortable  Respiratory system: no increased work of breathing.  Cardiovascular system: normal S1 & S2 heard. No JVD, murmurs, rubs, gallops or clicks. No pedal  edema. Gastrointestinal system: Abdomen is nondistended, soft and nontender. No organomegaly or masses felt. Normal bowel sounds heard. Central nervous system: Alert and oriented. No focal neurological deficits. Extremities: Symmetric 5 x 5 power. Skin: No rashes, lesions or ulcers. Psychiatry: Judgement and insight appear normal. Mood & affect appropriate.   Data Reviewed: I have personally reviewed following labs and imaging studies  CBC: Recent Labs  Lab 06/28/22 0914 06/29/22 0456  WBC 8.1 6.4  NEUTROABS 5.3  --   HGB 11.4* 9.7*  HCT 35.4* 30.9*  MCV 88.1 88.8  PLT 522* 447*    Basic Metabolic Panel: Recent Labs  Lab 06/28/22 0914 06/29/22 0456  NA 137 138  K 3.3* 3.4*  CL 101 107  CO2 24 24  GLUCOSE 87 78  BUN 5* <5*  CREATININE 0.66 0.59  CALCIUM 8.6* 8.0*  MG  --  1.7    CBG: No results for input(s): "GLUCAP"  in the last 168 hours.  Recent Results (from the past 240 hour(s))  Culture, blood (routine x 2)     Status: None (Preliminary result)   Collection Time: 06/28/22  2:00 PM   Specimen: BLOOD RIGHT ARM  Result Value Ref Range Status   Specimen Description BLOOD RIGHT ARM  Final   Special Requests   Final    BOTTLES DRAWN AEROBIC AND ANAEROBIC Blood Culture results may not be optimal due to an excessive volume of blood received in culture bottles Performed at [facility], [address] [place], [place] [address]    Culture PENDING  Incomplete   Report Status PENDING  Incomplete  Culture, blood (routine x 2)     Status: None (Preliminary result)   Collection Time: 06/28/22  2:18 PM   Specimen: BLOOD LEFT HAND  Result Value Ref Range Status   Specimen Description BLOOD LEFT HAND  Final   Special Requests   Final    BOTTLES DRAWN AEROBIC ONLY Blood Culture results may not be optimal due to an inadequate volume of blood received in culture bottles Performed at [facility], [address], [place], [place] [address]    Culture PENDING  Incomplete    Report Status PENDING  Incomplete     Radiology Studies: CT CHEST ABDOMEN PELVIS W CONTRAST  Result Date: 06/28/2022 CLINICAL DATA:  Abdominal pain EXAM: CT CHEST, ABDOMEN, AND PELVIS WITH CONTRAST TECHNIQUE: Multidetector CT imaging of the chest, abdomen and pelvis was performed following the standard protocol during bolus administration of intravenous contrast. RADIATION DOSE REDUCTION: This exam was performed according to the departmental dose-optimization program which includes automated exposure control, adjustment of the mA and/or kV according to patient size and/or use of iterative reconstruction technique. CONTRAST:  43mL OMNIPAQUE IOHEXOL 300 MG/ML  SOLN COMPARISON:  CT chest done on 04/06/2022 FINDINGS: CT CHEST FINDINGS Cardiovascular: Coronary artery calcifications are seen. Mediastinum/Nodes: Subcentimeter nodes in the mediastinum have not changed significantly. Right lobe of thyroid is larger than left. There is inhomogeneous enhancement and coarse calcifications in thyroid. Similar findings were seen on the previous study. Lungs/Pleura: There is 4.5 x 3 cm mixed density lesion in left hilum with no significant interval change. This lesion is inseparable from the left lower lobe bronchus. Small patchy infiltrates are seen in left upper lobe, lingula and left lower lobe with interval worsening. There is ectasia of the bronchi in the lingula and left lower lobe. There is no pleural effusion or pneumothorax. Musculoskeletal: No acute findings are seen in bony structures in the thorax. Deformity in anterolateral aspect of right eighth rib may suggest healed or healing fracture. CT ABDOMEN PELVIS FINDINGS Hepatobiliary: There is 2 cm low-density in anterior live close to falciform ligament, possibly benign process such as aberrant venous drainage or space-occupying neoplastic process. There is no dilation of bile ducts. Gallbladder is unremarkable. Pancreas: There is minimal prominence of pancreatic  duct. No focal abnormalities are seen. Spleen: Spleen appears smaller than usual in size. Adrenals/Urinary Tract: Adrenals are unremarkable. There is no hydronephrosis. Right renal pelvis is prominent in size. There are no renal or ureteral stones. There is a 9 mm low-density in the upper pole of right kidney. There is 1 cm low-density in the posterior midportion of the right kidney. Beam hardening artifacts limit evaluation of the urinary bladder. Stomach/Bowel: Stomach is unremarkable. Small bowel loops are not dilated. Appendix is not seen. There is no significant wall thickening in colon. There is no pericolic stranding. Vascular/Lymphatic: There  are scattered atherosclerotic plaques and calcifications in aorta and its major branches. Reproductive: There is inhomogeneous appearance in myometrium suggesting possible uterine fibroids. Evaluation is limited by beam hardening artifacts. Other: There is no ascites or pneumoperitoneum. Musculoskeletal: No acute findings are seen. There is previous right hip arthroplasty. IMPRESSION: There is a 4.5 x 3 cm central lesion in left hilum consistent with a neoplastic process. There are patchy infiltrates in left mid and left lower lung fields suggesting pneumonia with interval worsening. No new significant lymphadenopathy is seen. There is no evidence of intestinal obstruction or pneumoperitoneum. There is no hydronephrosis. There is 2 cm low-density in anterior aspect of liver close to the falciform ligament which may suggest benign process such as aberrant venous drainage or space-occupying neoplastic process. Short-term follow-up CT along with MRI if warranted may be considered. Arteriosclerosis.  Coronary artery calcifications are seen. Possible small renal cysts. Possible uterine fibroids. There is inhomogeneous attenuation in thyroid. Right lobe of thyroid is larger than left. Other findings as described in the body of the report. Electronically Signed   By: Elmer Picker M.D.   On: 06/28/2022 13:34   CT Head Wo Contrast  Result Date: 06/28/2022 CLINICAL DATA:  History of brain cancer. Right arm and leg weakness for 2 weeks. EXAM: CT HEAD WITHOUT CONTRAST TECHNIQUE: Contiguous axial images were obtained from the base of the skull through the vertex without intravenous contrast. RADIATION DOSE REDUCTION: This exam was performed according to the departmental dose-optimization program which includes automated exposure control, adjustment of the mA and/or kV according to patient size and/or use of iterative reconstruction technique. COMPARISON:  CT head 05/24/2022 FINDINGS: Brain: The 1.8 cm TV x 1.4 cm AP x 2.0 cm cc cystic lesion in the left frontal lobe is not significantly changed in size. Extensive surrounding vasogenic edema is again seen throughout much of the left cerebral hemisphere which appears overall stable to slightly decreased with slightly decreased rightward midline shift now measuring 7 mm (previously measured 10 mm). The cystic lesion in the right cerebellar hemisphere appears decreased in size common best appreciated in the coronal plane where it measures up to 0.8 cm TV x 0.5 cm cc (previously measured 1.8 cm x 0.9 cm). There is decreased surrounding edema without effacement of fourth ventricle. Previously seen acute blood in the left temporal occipital region is decreased in conspicuity with decreased size of the associated cystic lesion. There is no new acute intracranial hemorrhage or extra-axial fluid collection. There is no evidence of developing hydrocephalus. Vascular: No hyperdense vessel or unexpected calcification. Skull: Normal. Negative for fracture or focal lesion. Sinuses/Orbits: The imaged paranasal sinuses are clear. The globes and orbits are unremarkable. Other: None. IMPRESSION: 1. Decreased size of the right cerebellar hemisphere and left temporal occipital metastatic lesions with resolved blood products seen on the prior CT. 2. No  significant interval change in size of the largest lesion in the left frontal lobe with stable to slightly decreased surrounding edema and slightly decreased rightward midline shift currently measuring 7 mm. 3. No new hemorrhage. Electronically Signed   By: Valetta Mole M.D.   On: 06/28/2022 13:19   DG Chest Port 1 View  Result Date: 06/28/2022 CLINICAL DATA:  Weakness EXAM: PORTABLE CHEST 1 VIEW COMPARISON:  05/23/2022 FINDINGS: Unremarkable cardiac silhouette. No pneumothorax or pleural effusion. Right lung is clear. Left lower hemithorax demonstrates mild diffuse density that may represent a developing pneumonia. IMPRESSION: Possible early pneumonia left base. Otherwise no acute cardiopulmonary process. Electronically  Signed   By: Sammie Bench M.D.   On: 06/28/2022 09:05    Scheduled Meds:  guaiFENesin  600 mg Oral BID   heparin  5,000 Units Subcutaneous Q8H   ipratropium-albuterol  3 mL Nebulization TID   levETIRAcetam  500 mg Oral BID   pantoprazole  40 mg Oral Daily   polyethylene glycol  17 g Oral Daily   Continuous Infusions:  azithromycin 500 mg (06/29/22 1440)   cefTRIAXone (ROCEPHIN)  IV 1 g (06/29/22 1442)     LOS: 1 day   Time spent: 40 mins  Adreanne Yono Wynetta Emery, MD How to contact the [facility] Attending or Consulting provider [place] or covering provider during after hours [place], for this patient?  Check the care team in [facility] and look for a) attending/consulting TR[facility] provider listed and b) the [facility] team listed Log into [email or web address] and use [place]'s universal password to access. If you do not have the password, please contact the hospital operator. Locate the [facility] provider you are looking for under [facility] and page to a number that you can be directly reached. If you still have difficulty reaching the provider, please page the [facility] (Director on Call) for the Hospitalists listed on amion for assistance.  06/29/2022, 5:05 PM

## 2022-06-29 NOTE — Consult Note (Signed)
Consultation Note Date: 06/29/2022   Patient Name: Sandra Horne  DOB: February 14, 1956  MRN: [id number]  Age / Sex: 67 y.o., female  PCP: Pcp, No Referring Physician: Murlean Iba, MD  Reason for Consultation: Establishing goals of care  HPI/Patient Profile: 67 y.o. female  with past medical history of stage 4 non small cell lung cancer, adenocarcinoma diagnosed December 2021 with left hilar/infrahilar mass in addition to left hilar and mediastinal lymphadenopathy with metastatic burden to brain, declined systemic therapy/treatment, accepted radiation tx, admitted twice in February 2024, admitted 2/25 with new hemorrhage in posterior L occipital lobe, metastatic lesion w/ extensive vasogenic edema dn R midline shift 1cm admitted on 06/28/2022 with pneumonia .   Clinical Assessment and Goals of Care: I have reviewed medical records including EPIC notes, labs and imaging, received report from RN, assessed the patient.  Sandra Horne is lying quietly in bed.  She appears chronically ill.  She greets me, making and mostly keeping eye contact.  She is alert and oriented, able to make her needs known.  There is no family at bedside at this time.   We meet at the bedside to discuss diagnosis prognosis, GOC, EOL wishes, disposition and options.  I introduced Palliative Medicine as specialized medical care for people living with serious illness. It focuses on providing relief from the symptoms and stress of a serious illness. The goal is to improve quality of life for both the patient and the family.  We then focused on their current illness.  We talk about pneumonia and the treatment plan including, but not limited to use, fluids, IV antibiotics.  At this point Sandra Horne seems knowledgeable about her pneumonia and the treatment plan.  She tells me that she is already feeling better.  We also talk about her cancer  treatment.  She continues to decline systemic therapies but smiles when she states she only has 2 radiation treatments left to complete.  I encouraged her to work closely with oncology and palliative team at [facility].  The natural disease trajectory and expectations at EOL were discussed.  Advanced directives, concepts specific to code status, artifical feeding and hydration, and rehospitalization were considered and discussed.  We talk about the concept of "treat the treatable, but allow a natural passing".  I share that what we want at 58 is different than 7, than if we live to age 67.  I encouraged Sandra Horne to think about, pray about her choices and share those with her HCPOA and family.   Palliative Care services outpatient were discussed. Sandra Horne is active with [facility] Pall[facility].   Discussed the importance of continued conversation with family and the medical providers regarding overall plan of care and treatment options, ensuring decisions are within the context of the patient's values and GOCs.  Questions and concerns were addressed.  The patient was encouraged to call with questions or concerns.  PMT will continue to support holistically.   HCPOA  NEXT OF KIN  - spouse Roz Herzberger  SUMMARY OF RECOMMENDATIONS   At this point continue full scope/full code Home with home health if needed Continue with radiation treatment Follow-up with oncology outpatient   Code Status/Advance Care Planning: Full code  Symptom Management:  Per hospitalist, no additional needs at this time.  Palliative Prophylaxis:  Frequent Pain Assessment and Oral Care  Additional Recommendations (Limitations, Scope, Preferences): Full Scope Treatment  Psycho-social/Spiritual:  Desire for further Chaplaincy support:no Additional Recommendations: Caregiving  Support/Resources and Education on Hospice  Prognosis:  Unable to determine, based on outcomes.  Guarded  Discharge Planning: To be  determined, anticipate home      Primary Diagnoses: Present on Admission:  Pneumonia  Primary cancer of left lower lobe of lung  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 [facility])  Malignant neoplasm metastatic to brain  Tobacco abuse  Hypokalemia  Hypoalbuminemia  Anemia in neoplastic disease  Thrombocytosis   I have reviewed the medical record, interviewed the patient and family, and examined the patient. The following aspects are pertinent.  Past Medical History:  Diagnosis Date   Cancer    Lung and Brain mets   Social History   Socioeconomic History   Marital status: Married    Spouse name: Not on file   Number of children: Not on file   Years of education: Not on file   Highest education level: Not on file  Occupational History   Not on file  Tobacco Use   Smoking status: Some Days    Years: 40    Types: Cigarettes   Smokeless tobacco: Never   Tobacco comments:    3 cigarettes daily   Vaping Use   Vaping Use: Never used  Substance and Sexual Activity   Alcohol use: Not Currently    Comment: drinks beer rarely    Drug use: Not Currently    Types: Marijuana    Comment: last marijuana use mid 11/19/2019   Sexual activity: Yes    Birth control/protection: None  Other Topics Concern   Not on file  Social History Narrative   Not on file   Social Determinants of Health   Financial Resource Strain: Low Risk  (01/14/2020)   Overall Financial Resource Strain (CARDIA)    Difficulty of Paying Living Expenses: Not hard at all  Food Insecurity: No Food Insecurity (05/24/2022)   Hunger Vital Sign    Worried About Running Out of Food in the Last Year: Never true    Ran Out of Food in the Last Year: Never true  Transportation Needs: No Transportation Needs (05/24/2022)   PRAPARE - Hydrologist (Medical): No    Lack of Transportation (Non-Medical): No  Recent Concern: Transportation Needs - Unmet Transportation  Needs (05/08/2022)   PRAPARE - Transportation    Lack of Transportation (Medical): Yes    Lack of Transportation (Non-Medical): Yes  Physical Activity: Inactive (01/14/2020)   Exercise Vital Sign    Days of Exercise per Week: 0 days    Minutes of Exercise per Session: 0 min  Stress: No Stress Concern Present (01/14/2020)   [place]    Feeling of Stress : Not at all  Social Connections: Socially Isolated (01/14/2020)   Social Connection and Isolation Panel [NHANES]    Frequency of Communication with Friends and Family: More than three times a week    Frequency of Social Gatherings with Friends and Family: Never    Attends Religious Services:  Never    Active Member of Clubs or Organizations: No    Attends Archivist Meetings: Never    Marital Status: Widowed   Family History  Problem Relation Age of Onset   Hypertension Mother    Stroke Mother    Hypertension Father    Cancer Sister    Cancer Paternal Uncle    Scheduled Meds:  guaiFENesin  600 mg Oral BID   heparin  5,000 Units Subcutaneous Q8H   ipratropium-albuterol  3 mL Nebulization TID   levETIRAcetam  500 mg Oral BID   pantoprazole  40 mg Oral Daily   polyethylene glycol  17 g Oral Daily   Continuous Infusions:  0.9 % NaCl with KCl 20 mEq / L 60 mL/hr at 06/29/22 0916   azithromycin     cefTRIAXone (ROCEPHIN)  IV     PRN Meds:.acetaminophen **OR** acetaminophen, chlorpheniramine-HYDROcodone, fentaNYL (SUBLIMAZE) injection, gabapentin, ipratropium-albuterol, nicotine, ondansetron **OR** ondansetron (ZOFRAN) IV, mouth rinse, oxyCODONE, prochlorperazine Medications Prior to Admission:  Prior to Admission medications   Medication Sig Start Date End Date Taking? Authorizing Provider  polyethylene glycol (MIRALAX / GLYCOLAX) 17 g packet Take 17 g by mouth daily. 05/12/22  Yes Shelly Coss, MD  gabapentin (NEURONTIN) 300 MG capsule Take 1 capsule  (300 mg total) by mouth 3 (three) times daily. Patient not taking: Reported on 06/28/2022 01/13/22   Carole Civil, MD  levETIRAcetam (KEPPRA) 500 MG tablet Take 1 tablet (500 mg total) by mouth 2 (two) times daily. Patient not taking: Reported on 06/28/2022 05/12/22 08/10/22  Shelly Coss, MD  naproxen (NAPROSYN) 375 MG tablet Take 1 tablet (375 mg total) by mouth every 12 (twelve) hours as needed. Patient not taking: Reported on 06/28/2022 05/12/22   Shelly Coss, MD  pantoprazole (PROTONIX) 40 MG tablet Take 1 tablet (40 mg total) by mouth daily. Patient not taking: Reported on 06/28/2022 05/12/22   Shelly Coss, MD   No Known Allergies Review of Systems  Unable to perform ROS: Other    Physical Exam Vitals and nursing note reviewed.  Constitutional:      General: She is not in acute distress. HENT:     Head: Normocephalic and atraumatic.     Mouth/Throat:     Mouth: Mucous membranes are moist.  Cardiovascular:     Rate and Rhythm: Normal rate.  Pulmonary:     Effort: Pulmonary effort is normal. No respiratory distress.  Musculoskeletal:        General: No swelling.  Skin:    General: Skin is warm and dry.  Neurological:     Mental Status: She is alert and oriented to person, place, and time.  Psychiatric:        Mood and Affect: Mood normal.        Behavior: Behavior normal.     Vital Signs: BP (!) 91/59 (BP Location: Right Arm)   Pulse 79   Temp 98.4 F (36.9 C) (Oral)   Resp 18   Ht 5\' 4"  (1.626 m)   Wt 54.9 kg   SpO2 99%   BMI 20.79 kg/m  Pain Scale: 0-10   Pain Score: 0-No pain   SpO2: SpO2: 99 % O2 Device:SpO2: 99 % O2 Flow Rate: .   IO: Intake/output summary:  Intake/Output Summary (Last 24 hours) at 06/29/2022 1145 Last data filed at 06/29/2022 0916 Gross per 24 hour  Intake 1804.81 ml  Output --  Net 1804.81 ml    LBM: Last BM Date :  (last  week per pt) Baseline Weight: Weight: 54.9 kg Most recent weight: Weight: 54.9 kg     Palliative  Assessment/Data:     Time In: 0900 Time Out: 0955 Time Total: 55 minutes  Greater than 50%  of this time was spent counseling and coordinating care related to the above assessment and plan.  Signed by: Drue Novel, NP   Please contact Palliative Medicine Team phone at [phone] for questions and concerns.  For individual provider: See Shea Evans

## 2022-06-30 ENCOUNTER — Encounter (HOSPITAL_COMMUNITY): Payer: Self-pay | Admitting: Family Medicine

## 2022-06-30 ENCOUNTER — Ambulatory Visit
Admit: 2022-06-30 | Discharge: 2022-06-30 | Disposition: A | Discharge status: Home / Self Care | Payer: 59 | Attending: Radiation Oncology | Admitting: Radiation Oncology

## 2022-06-30 DIAGNOSIS — Z7189 Other specified counseling: Secondary | ICD-10-CM | POA: Diagnosis not present

## 2022-06-30 DIAGNOSIS — C3490 Malignant neoplasm of unspecified part of unspecified bronchus or lung: Secondary | ICD-10-CM | POA: Diagnosis not present

## 2022-06-30 DIAGNOSIS — J181 Lobar pneumonia, unspecified organism: Secondary | ICD-10-CM | POA: Diagnosis not present

## 2022-06-30 DIAGNOSIS — E876 Hypokalemia: Secondary | ICD-10-CM | POA: Diagnosis not present

## 2022-06-30 DIAGNOSIS — Z515 Encounter for palliative care: Secondary | ICD-10-CM | POA: Diagnosis not present

## 2022-06-30 DIAGNOSIS — C3432 Malignant neoplasm of lower lobe, left bronchus or lung: Secondary | ICD-10-CM

## 2022-06-30 DIAGNOSIS — J189 Pneumonia, unspecified organism: Secondary | ICD-10-CM | POA: Diagnosis not present

## 2022-06-30 MED ORDER — POTASSIUM CHLORIDE CRYS ER 20 MEQ PO TBCR
20.0000 meq | EXTENDED_RELEASE_TABLET | Freq: Once | ORAL | Status: AC
  Administered 2022-06-30: 20 meq via ORAL
  Filled 2022-06-30: qty 1

## 2022-06-30 MED ORDER — IPRATROPIUM-ALBUTEROL 0.5-2.5 (3) MG/3ML IN SOLN
3.0000 mL | Freq: Two times a day (BID) | RESPIRATORY_TRACT | Status: DC
  Administered 2022-06-30 – 2022-07-02 (×4): 3 mL via RESPIRATORY_TRACT
  Filled 2022-06-30 (×4): qty 3

## 2022-06-30 MED ORDER — GUAIFENESIN-DM 100-10 MG/5ML PO SYRP: 5.0000 mL | ORAL_SOLUTION | ORAL | Status: DC | PRN

## 2022-06-30 NOTE — Progress Notes (Signed)
Radiation Oncology         [phone] ________________________________  Initial inpatient Consultation  Name: Sandra Horne MRN: [id number]  Date of Service: 06/28/2022 DOB: 1955/05/04  CC:Pcp, No  No ref. 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    C34.90   ICD-10-CM   1. Community acquired pneumonia, unspecified laterality  J18.9     2. Primary malignant neoplasm of lung metastatic to other site, unspecified laterality  C34.90     3. Failure to thrive in adult  R62.7       HISTORY OF PRESENT ILLNESS: Sandra Horne is a 67 y.o. female with a history of stage 4 non small cell lung cancer seen at the request of Dr. Tery Sanfilippo. Shew as originally diagnosed in December of 2021 presented with a left hilar/infrahilar mass in addition to left hilar and mediastinal lymphadenopathy and brain metastasis. She presented with facial droop and right-sided weakness. She had declined to proceed with any systemic therapy and also declined immunotherapy despite being told by oncology that her disease could rapidly progress. She saw Dr. Earlie Server in November 2023 and he had recommended patient to follow-up with palliative care. She is well known to [place] as she has been with whole brain radiation therapy as well as multiple SRS therapies to the brain.  Patient was admitted twice in February 2024. MRI of the brain at the time showed new metastatic lesions and hospice was recommended but patient wanted to continue radiation therapy and remain full code. She was admitted again on 05/23/2022 for right-sided weakness. CT at the time showed new hemorrhage in posterior left occipital lobe with a metastatic lesion with extensive vasogenic edema and rightward midline shift measuring up to 1 cm. She completed her WBRT radiotherapy and was discharged on a Decadron taper and Keppra. SNF was  recommended at that time, but patient decided to go home with family and have home health PT OT. Patient has had multiple palliative consultations however patient and family would like to continue to receive radiation treatments and remain full code.   Most recently, patient was brought to the ER on 06/27/21 by her husband for right arm and leg weakness for 2 weeks. Patient has not been able to eat for the past 2 weeks and has only been drinking liquids. CT of the head showed decreased size of the right cerebellar hemisphere and left temporal metastatic lesions with resolved blood products. No significant interval change in the largest lesion in the left frontal lobe with stable to slightly decreased surrounding edema and slightly decreased midline shift. CT of the CAP showed 4.5 x 3 cm central lesion in the left hilum consistent with a neoplastic process. Patchy infiltrates in the left mid and left lower lung fields suggested pneumonia with interval worsening. Patient was admitted for treatment of pneumonia. Palliative care was again consulted. Patient would like to stay full code and continue with her radiation treatments.   Patient was kindly referred to [place] to disucss radiation therapy for the tumor in the lung.   PREVIOUS RADIATION THERAPY: Yes   Radiation Treatment Dates:   Intent: Palliative Radiation Treatment Dates: 05/03/2022 through 05/25/2022 Site Technique Total Dose (Gy) Dose per Fx (Gy) Completed Fx Beam Energies  Brain: Brain IMRT 30/30 3 10/10 6X    01/13/22: SRS PTV 7-8// The patient received a single fraction of 20 Gy to  the new right precentral gyrus and right parietal lesions         10/29/21//SRS PTV2-6:      11/09/21 - 11/13/21//SRS PTV3:    04/03/21 - 04/08/21//fractionated SRS PTV1:       PAST MEDICAL HISTORY:  Past Medical History:  Diagnosis Date   Cancer    Lung and Brain mets      PAST SURGICAL HISTORY: Past Surgical History:  Procedure Laterality Date    BRONCHIAL BRUSHINGS  03/26/2021   Procedure: BRONCHIAL BRUSHINGS;  Surgeon: Margaretha Seeds, MD;  Location: [facility];  Service: Cardiopulmonary;;   BRONCHIAL NEEDLE ASPIRATION BIOPSY  03/26/2021   Procedure: BRONCHIAL NEEDLE ASPIRATION BIOPSIES;  Surgeon: Margaretha Seeds, MD;  Location: [facility];  Service: Cardiopulmonary;;   BRONCHIAL WASHINGS  03/26/2021   Procedure: BRONCHIAL WASHINGS;  Surgeon: Margaretha Seeds, MD;  Location: [facility];  Service: Cardiopulmonary;;   ECTOPIC PREGNANCY SURGERY     ENDOBRONCHIAL ULTRASOUND Bilateral 03/26/2021   Procedure: ENDOBRONCHIAL ULTRASOUND;  Surgeon: Margaretha Seeds, MD;  Location: [facility];  Service: Cardiopulmonary;  Laterality: Bilateral;   HEMOSTASIS CONTROL  03/26/2021   Procedure: HEMOSTASIS CONTROL;  Surgeon: Margaretha Seeds, MD;  Location: [facility];  Service: Cardiopulmonary;;   HIP ARTHROPLASTY Right 10/13/2021   Procedure: ARTHROPLASTY BIPOLAR HIP (HEMIARTHROPLASTY);  Surgeon: Carole Civil, MD;  Location: [facility];  Service: Orthopedics;  Laterality: Right;    FAMILY HISTORY:  Family History  Problem Relation Age of Onset   Hypertension Mother    Stroke Mother    Hypertension Father    Cancer Sister    Cancer Paternal Uncle     SOCIAL HISTORY:  Social History   Socioeconomic History   Marital status: Married    Spouse name: Not on file   Number of children: Not on file   Years of education: Not on file   Highest education level: Not on file  Occupational History   Not on file  Tobacco Use   Smoking status: Some Days    Years: 40    Types: Cigarettes   Smokeless tobacco: Never   Tobacco comments:    3 cigarettes daily   Vaping Use   Vaping Use: Never used  Substance and Sexual Activity   Alcohol use: Not Currently    Comment: drinks beer rarely    Drug use: Not Currently    Types: Marijuana    Comment: last marijuana use mid 11/19/2019   Sexual activity: Yes    Birth  control/protection: None  Other Topics Concern   Not on file  Social History Narrative   Not on file   Social Determinants of Health   Financial Resource Strain: Low Risk  (01/14/2020)   Overall Financial Resource Strain (CARDIA)    Difficulty of Paying Living Expenses: Not hard at all  Food Insecurity: No Food Insecurity (06/29/2022)   Hunger Vital Sign    Worried About Running Out of Food in the Last Year: Never true    Ran Out of Food in the Last Year: Never true  Transportation Needs: No Transportation Needs (06/29/2022)   PRAPARE - Hydrologist (Medical): No    Lack of Transportation (Non-Medical): No  Recent Concern: Transportation Needs - Unmet Transportation Needs (05/08/2022)   PRAPARE - Transportation    Lack of Transportation (Medical): Yes    Lack of Transportation (Non-Medical): Yes  Physical Activity: Inactive (01/14/2020)   Exercise Vital Sign    Days of Exercise  per Week: 0 days    Minutes of Exercise per Session: 0 min  Stress: No Stress Concern Present (01/14/2020)   [place]    Feeling of Stress : Not at all  Social Connections: Socially Isolated (01/14/2020)   Social Connection and Isolation Panel [NHANES]    Frequency of Communication with Friends and Family: More than three times a week    Frequency of Social Gatherings with Friends and Family: Never    Attends Religious Services: Never    Marine scientist or Organizations: No    Attends Archivist Meetings: Never    Marital Status: Widowed  Intimate Partner Violence: Not At Risk (06/29/2022)   Humiliation, Afraid, Rape, and Kick questionnaire    Fear of Current or Ex-Partner: No    Emotionally Abused: No    Physically Abused: No    Sexually Abused: No    ALLERGIES: Patient has no known allergies.  MEDICATIONS:  Current Facility-Administered Medications  Medication Dose Route Frequency  Provider Last Rate Last Admin   acetaminophen (TYLENOL) tablet 650 mg  650 mg Oral Q6H PRN Johnson, Clanford L, MD       Or   acetaminophen (TYLENOL) suppository 650 mg  650 mg Rectal Q6H PRN Johnson, Clanford L, MD       azithromycin (ZITHROMAX) 500 mg in sodium chloride 0.9 % 250 mL IVPB  500 mg Intravenous Q24H Johnson, Clanford L, MD   Stopped at 06/29/22 1606   cefTRIAXone (ROCEPHIN) 1 g in sodium chloride 0.9 % 100 mL IVPB  1 g Intravenous Q24H Johnson, Clanford L, MD   Stopped at 06/29/22 1607   chlorpheniramine-HYDROcodone (TUSSIONEX) 10-8 MG/5ML suspension 5 mL  5 mL Oral Q12H PRN Johnson, Clanford L, MD       fentaNYL (SUBLIMAZE) injection 12.5 mcg  12.5 mcg Intravenous Q2H PRN Wynetta Emery, Clanford L, MD   12.5 mcg at 06/28/22 1836   gabapentin (NEURONTIN) capsule 300 mg  300 mg Oral TID PRN Wynetta Emery, Clanford L, MD       guaiFENesin (MUCINEX) 12 hr tablet 600 mg  600 mg Oral BID Johnson, Clanford L, MD   600 mg at 06/29/22 2100   heparin injection 5,000 Units  5,000 Units Subcutaneous Q8H Johnson, Clanford L, MD   5,000 Units at 06/30/22 0545   ipratropium-albuterol (DUONEB) 0.5-2.5 (3) MG/3ML nebulizer solution 3 mL  3 mL Nebulization TID Wynetta Emery, Clanford L, MD   3 mL at 06/30/22 0708   ipratropium-albuterol (DUONEB) 0.5-2.5 (3) MG/3ML nebulizer solution 3 mL  3 mL Nebulization Q4H PRN Wynetta Emery, Clanford L, MD   3 mL at 06/28/22 2103   levETIRAcetam (KEPPRA) tablet 500 mg  500 mg Oral BID Wynetta Emery, Clanford L, MD   500 mg at 06/29/22 2100   nicotine (NICODERM CQ - dosed in mg/24 hours) patch 14 mg  14 mg Transdermal Daily PRN Johnson, Clanford L, MD       ondansetron (ZOFRAN) tablet 4 mg  4 mg Oral Q6H PRN Johnson, Clanford L, MD       Or   ondansetron (ZOFRAN) injection 4 mg  4 mg Intravenous Q6H PRN Johnson, Clanford L, MD       Oral care mouth rinse  15 mL Mouth Rinse PRN Johnson, Clanford L, MD       oxyCODONE (Oxy IR/ROXICODONE) immediate release tablet 5 mg  5 mg Oral Q4H PRN Johnson,  Clanford L, MD   5 mg at 06/30/22  0320   pantoprazole (PROTONIX) EC tablet 40 mg  40 mg Oral Daily Johnson, Clanford L, MD   40 mg at 06/29/22 0935   polyethylene glycol (MIRALAX / GLYCOLAX) packet 17 g  17 g Oral Daily Johnson, Clanford L, MD   17 g at 06/29/22 0935   prochlorperazine (COMPAZINE) injection 10 mg  10 mg Intravenous Q4H PRN Johnson, Clanford L, MD        REVIEW OF SYSTEMS:  She states that her breathing is "fine." She denies any shortness of breath, cough, or hemoptysis.    PHYSICAL EXAM:  Wt Readings from Last 3 Encounters:  06/28/22 121 lb 1.6 oz (54.9 kg)  05/23/22 145 lb (65.8 kg)  05/08/22 149 lb 14.6 oz (68 kg)   Temp Readings from Last 3 Encounters:  06/30/22 98.7 F (37.1 C)  05/26/22 98.3 F (36.8 C) (Oral)  05/12/22 99 F (37.2 C) (Oral)   BP Readings from Last 3 Encounters:  06/30/22 102/65  05/26/22 116/82  05/12/22 130/64   Pulse Readings from Last 3 Encounters:  06/30/22 83  05/26/22 63  05/12/22 (!) 101   Pain Assessment Pain Score: Asleep/10  A full physical exam was deferred as consult occurred via telephone.   KPS = 50  100 - Normal; no complaints; no evidence of disease. 90   - Able to carry on normal activity; minor signs or symptoms of disease. 80   - Normal activity with effort; some signs or symptoms of disease. 63   - Cares for self; unable to carry on normal activity or to do active work. 60   - Requires occasional assistance, but is able to care for most of his personal needs. 50   - Requires considerable assistance and frequent medical care. 49   - Disabled; requires special care and assistance. 44   - Severely disabled; hospital admission is indicated although death not imminent. 96   - Very sick; hospital admission necessary; active supportive treatment necessary. 10   - Moribund; fatal processes progressing rapidly. 0     - Dead  Karnofsky DA, Abelmann [place], Craver LS and [facility] [phone]) The use of the nitrogen  mustards in the palliative treatment of carcinoma: with particular reference to bronchogenic carcinoma Cancer 1 634-56  LABORATORY DATA:  Lab Results  Component Value Date   WBC 6.4 06/29/2022   HGB 9.7 (L) 06/29/2022   HCT 30.9 (L) 06/29/2022   MCV 88.8 06/29/2022   PLT 447 (H) 06/29/2022   Lab Results  Component Value Date   NA 138 06/29/2022   K 3.4 (L) 06/29/2022   CL 107 06/29/2022   CO2 24 06/29/2022   Lab Results  Component Value Date   ALT 11 06/28/2022   AST 16 06/28/2022   ALKPHOS 70 06/28/2022   BILITOT 0.9 06/28/2022     RADIOGRAPHY: CT CHEST ABDOMEN PELVIS W CONTRAST  Result Date: 06/28/2022 CLINICAL DATA:  Abdominal pain EXAM: CT CHEST, ABDOMEN, AND PELVIS WITH CONTRAST TECHNIQUE: Multidetector CT imaging of the chest, abdomen and pelvis was performed following the standard protocol during bolus administration of intravenous contrast. RADIATION DOSE REDUCTION: This exam was performed according to the departmental dose-optimization program which includes automated exposure control, adjustment of the mA and/or kV according to patient size and/or use of iterative reconstruction technique. CONTRAST:  7mL OMNIPAQUE IOHEXOL 300 MG/ML  SOLN COMPARISON:  CT chest done on 04/06/2022 FINDINGS: CT CHEST FINDINGS Cardiovascular: Coronary artery calcifications are seen. Mediastinum/Nodes: Subcentimeter nodes in the mediastinum  have not changed significantly. Right lobe of thyroid is larger than left. There is inhomogeneous enhancement and coarse calcifications in thyroid. Similar findings were seen on the previous study. Lungs/Pleura: There is 4.5 x 3 cm mixed density lesion in left hilum with no significant interval change. This lesion is inseparable from the left lower lobe bronchus. Small patchy infiltrates are seen in left upper lobe, lingula and left lower lobe with interval worsening. There is ectasia of the bronchi in the lingula and left lower lobe. There is no pleural effusion  or pneumothorax. Musculoskeletal: No acute findings are seen in bony structures in the thorax. Deformity in anterolateral aspect of right eighth rib may suggest healed or healing fracture. CT ABDOMEN PELVIS FINDINGS Hepatobiliary: There is 2 cm low-density in anterior live close to falciform ligament, possibly benign process such as aberrant venous drainage or space-occupying neoplastic process. There is no dilation of bile ducts. Gallbladder is unremarkable. Pancreas: There is minimal prominence of pancreatic duct. No focal abnormalities are seen. Spleen: Spleen appears smaller than usual in size. Adrenals/Urinary Tract: Adrenals are unremarkable. There is no hydronephrosis. Right renal pelvis is prominent in size. There are no renal or ureteral stones. There is a 9 mm low-density in the upper pole of right kidney. There is 1 cm low-density in the posterior midportion of the right kidney. Beam hardening artifacts limit evaluation of the urinary bladder. Stomach/Bowel: Stomach is unremarkable. Small bowel loops are not dilated. Appendix is not seen. There is no significant wall thickening in colon. There is no pericolic stranding. Vascular/Lymphatic: There are scattered atherosclerotic plaques and calcifications in aorta and its major branches. Reproductive: There is inhomogeneous appearance in myometrium suggesting possible uterine fibroids. Evaluation is limited by beam hardening artifacts. Other: There is no ascites or pneumoperitoneum. Musculoskeletal: No acute findings are seen. There is previous right hip arthroplasty. IMPRESSION: There is a 4.5 x 3 cm central lesion in left hilum consistent with a neoplastic process. There are patchy infiltrates in left mid and left lower lung fields suggesting pneumonia with interval worsening. No new significant lymphadenopathy is seen. There is no evidence of intestinal obstruction or pneumoperitoneum. There is no hydronephrosis. There is 2 cm low-density in anterior  aspect of liver close to the falciform ligament which may suggest benign process such as aberrant venous drainage or space-occupying neoplastic process. Short-term follow-up CT along with MRI if warranted may be considered. Arteriosclerosis.  Coronary artery calcifications are seen. Possible small renal cysts. Possible uterine fibroids. There is inhomogeneous attenuation in thyroid. Right lobe of thyroid is larger than left. Other findings as described in the body of the report. Electronically Signed   By: Elmer Picker M.D.   On: 06/28/2022 13:34   CT Head Wo Contrast  Result Date: 06/28/2022 CLINICAL DATA:  History of brain cancer. Right arm and leg weakness for 2 weeks. EXAM: CT HEAD WITHOUT CONTRAST TECHNIQUE: Contiguous axial images were obtained from the base of the skull through the vertex without intravenous contrast. RADIATION DOSE REDUCTION: This exam was performed according to the departmental dose-optimization program which includes automated exposure control, adjustment of the mA and/or kV according to patient size and/or use of iterative reconstruction technique. COMPARISON:  CT head 05/24/2022 FINDINGS: Brain: The 1.8 cm TV x 1.4 cm AP x 2.0 cm cc cystic lesion in the left frontal lobe is not significantly changed in size. Extensive surrounding vasogenic edema is again seen throughout much of the left cerebral hemisphere which appears overall stable to slightly decreased with  slightly decreased rightward midline shift now measuring 7 mm (previously measured 10 mm). The cystic lesion in the right cerebellar hemisphere appears decreased in size common best appreciated in the coronal plane where it measures up to 0.8 cm TV x 0.5 cm cc (previously measured 1.8 cm x 0.9 cm). There is decreased surrounding edema without effacement of fourth ventricle. Previously seen acute blood in the left temporal occipital region is decreased in conspicuity with decreased size of the associated cystic lesion.  There is no new acute intracranial hemorrhage or extra-axial fluid collection. There is no evidence of developing hydrocephalus. Vascular: No hyperdense vessel or unexpected calcification. Skull: Normal. Negative for fracture or focal lesion. Sinuses/Orbits: The imaged paranasal sinuses are clear. The globes and orbits are unremarkable. Other: None. IMPRESSION: 1. Decreased size of the right cerebellar hemisphere and left temporal occipital metastatic lesions with resolved blood products seen on the prior CT. 2. No significant interval change in size of the largest lesion in the left frontal lobe with stable to slightly decreased surrounding edema and slightly decreased rightward midline shift currently measuring 7 mm. 3. No new hemorrhage. Electronically Signed   By: Valetta Mole M.D.   On: 06/28/2022 13:19   DG Chest Port 1 View  Result Date: 06/28/2022 CLINICAL DATA:  Weakness EXAM: PORTABLE CHEST 1 VIEW COMPARISON:  05/23/2022 FINDINGS: Unremarkable cardiac silhouette. No pneumothorax or pleural effusion. Right lung is clear. Left lower hemithorax demonstrates mild diffuse density that may represent a developing pneumonia. IMPRESSION: Possible early pneumonia left base. Otherwise no acute cardiopulmonary process. Electronically Signed   By: Sammie Bench M.D.   On: 06/28/2022 09:05      IMPRESSION/PLAN:   This visit was conducted via telephone to spare the patient unnecessary potential exposure in the healthcare setting during the current COVID-19 pandemic.  1. 67 y.o. with stage IV adenocarcinoma of the left lower lung with metastatic disease to the brain.  Today we discussed the workup and natural course of stage IV adenocarcinoma of the lung, highlighting the role of radiotherapy in the management. Most recent CT shows a 4.5 cm lesion in the left hilum. Due to the location of her lung mass, patient is at risk for airway compression or a collapsed lung. Patient has continued to remain full code  and would like to continue radiation therapy. She is a candidate for palliative radiotherapy to decrease risk of further disease spread. Accordingly, I recommend 30 Gy in 10 fractions to the left hilar mass. We discussed the available radiation techniques, and focused on the details and logistics of delivery. We discussed and outlined the risks, benefits, short and long-term effects associated with radiotherapy. She was encouraged to ask questions that were answered to her stated satisfaction.   At the end of our conversation, patient is interested in moving forward with radiation therapy. She is currently at [facility], but is being transferred to [place] early afternoon today. She has CT simulation scheduled for 06/30/21. We will plan to begin her treatment tomorrow as well.   We personally spent 60 minutes in this encounter including chart review, reviewing radiological studies, meeting face-to-face with the patient, entering orders and completing documentation.      Leona Singleton, PA-C    Tyler Pita, MD  [facility] Direct Dial: [phone]  Fax: [phone] [email or web address]  Skype  [email or web address]

## 2022-06-30 NOTE — Progress Notes (Addendum)
[facility] Acceptance Note  Transferred from [facility] to [facility], for radiation Oncology consultation and to start Palliative XRT for post obstructive PNA. Seen by Dr. Carles Collet earlier today at [facility] and his PN appreciated.  Subjective: Seen patient in room.  Getting ready to eat dinner.  Denies complaints.  Appears to have chronic right-sided weakness which she claims happened after a fall.  Keeps repeating that she is not sure why she was transferred because they told her that "it was clearing up" referring to her brain lesions.  Objective: Sitting up comfortably in bed.  No distress.  Stable vital signs.  Not hypoxic. RS: Diminished breath sounds in the left base with occasional left basal crackles.  Otherwise clear to auscultation.  No increased work of breathing. CVS: S1 and S2 heard, RRR.  No JVD, murmurs or pedal edema. GI: Abdomen is nondistended, soft and nontender.  No organomegaly or masses appreciated.  Normal bowel sounds heard. CNS: Alert and oriented.  No cranial nerve deficits.  Right hemiparesis, grade 4 x 5 power.   Psychiatric: Poor insight into her overall medical condition.  Appears somewhat irritable and upset regarding her transfer.  Assessment and plan:  Stage IV NSC adenocarcinoma of the lungs with postobstructive pneumonia, brain mets and right-sided hemiparesis: Communicated with Dr. Tammi Klippel, Radiation Oncology.  Patient is scheduled for CT simulation at 8 am tomorrow, to start a 10-treatment course of radiation to palliate her post-obastructive pneumonia.  Per palliative care follow-up, full code, full scope of treatment. Ordered PT eval and delirium precautions.  Hypokalemia: Replaced earlier today.  Follow BMP in AM.  Rest as per Dr. Doristine Devoid note from earlier today.  Vernell Leep, MD,  FACP, [place], [facility], [facility], [facility]   [facility] [place]     To contact the attending provider between 7A-7P or the covering provider during after hours 7P-7A,  please log into the web site [email or web address] and access using universal [place] password for that web site. If you do not have the password, please call the hospital operator.

## 2022-06-30 NOTE — Progress Notes (Signed)
Patient arrived to 1607 in NAD, Patient oriented to room and call bell in reach. Only belongings are a pair of socks and her cell phone.

## 2022-06-30 NOTE — Progress Notes (Addendum)
Palliative: Sandra Horne is resting quietly in bed in a dark room.  She appears chronically ill and somewhat frail.  She greets me, making and somewhat keeping eye contact.  She is alert and oriented, able to make her needs known.  There is no family at bedside at this time.  We talk about her transfer to [facility] for 10 sessions of radiation therapy with Dr. Tammi Klippel.  Sandra Horne shares her concerns about taking radiation therapy for her lung cancer.  She states, "I was told that 'my thing' was shrinking".  We talk about postobstructive pneumonia and the likelihood that this would return.   Sandra Horne shares that she was encouraged that she only had a few more sessions for her brain radiation treatments.  I encouraged her to work closely with the medical team.  I encouraged her to ask family to be present with her at the hospital.  She remains agreeable to transfer for chest radiation. Addendum: We also briefly discussed CODE STATUS, "treat the treatable, but allow a natural passing".  Conference with [facility] attending, bedside nursing staff, [facility] palliative NP, transition of care team related to patient condition, needs, goals of care, disposition.  Plan: Continue full scope/full code.  Transfer to [facility] for radiation treatment.  Hopeful for home with home health if needed.  Continue with palliative services through [facility].  7 minutes Sandra Axe, NP Palliative medicine team Team phone [phone] Greater than 50% of this time was spent counseling and coordinating care related to the above assessment and plan.

## 2022-06-30 NOTE — Progress Notes (Signed)
PROGRESS NOTE  Sandra Horne [id number] DOB: 02-10-1956 DOA: 06/28/2022 PCP: Pcp, No  Brief History:  67 year old female with stage IV non-small cell lung cancer, adenocarcinoma diagnosed in December 2021 presented with left hilar/infrahilar mass in addition to left hilar and mediastinal lymphadenopathy and metastatic brain metastasis.  She had facial droop and right-sided weakness.  She is status post SRS to multiple brain metastasis and currently undergoing radiation with plan for 2 more treatments.  She had declined to proceed with any systemic therapy and also declined immunotherapy despite being told by oncology that her disease could rapidly progress.  She saw Dr. Earlie Server in November 2023 and he had recommended patient to follow-up with palliative care.  She was admitted twice in February 2024.  MRI brain showed likely new metastatic lesions and hospice was recommended at that time but patient wanted to continue radiation and remain full code.  She was admitted again on 05/23/2022 with right-sided weakness and CT head showed new hemorrhage in posterior left occipital lobe, metastatic lesion with extensive vasogenic edema and rightward midline shift measuring up to 1 cm.  Neurosurgery recommended transfer to [facility] and repeat CT in 24 hours.  She remained stable and was then transferred to [facility] to complete her 10th dose of radiation treatment.  She was discharged on a prolonged taper of Decadron and Keppra.  SNF was recommended however she decided to go home with family and have home health PT OT.  Patient has had multiple palliative consultations and goals of care discussions however family has been very resistant and wanting patient to remain full code and continue radiation treatments.  Patient reports that she has 2 remaining radiation treatments left.  Patient again returns to emergency room today brought in by husband complaining of right arm and leg weakness  for 2 weeks.  Patient reports that she has not been able to eat for the past 2 weeks.  She has been drinking liquids.  She has been very nauseous.  She has had some cough and occasional shortness of breath symptoms.  No hemoptysis.  She was sent for CT head today with no findings of hemorrhage and there was no change in the largest lesion but some of the lesions are smaller in size.  She also had a CT of the chest abdomen and pelvis with findings concerning for pneumonia.  Other malignant findings are stable.  Patient is being admitted for treatment of pneumonia.  She also needs a palliative medicine discussion for further goals of care as she has continued to decline with poor oral intake over the past 2 weeks. She is tearful and upset saying that she "has been thru so much" over past weeks.     On 06/29/2022, Dr. Irwin Brakeman spoke with radiation oncology, Dr. Tyler Pita, who offered palliative radiation treatments directed to the tumor in the lung.  On 06/30/2022, the patient was agreeable to transfer to [facility] for radiation.    Assessment/Plan: Postobstructive Pneumonia  - 06/28/22 CT chest--4.5 x 3 cm lesion L-hilum inseparable from LLL bronchus with worsening patchy infiltrates; There is ectasia of the bronchi in the lingula and left lower lobe - started on ceftriaxone and azithro initially - continue IV antibiotics as ordered - stable on RA, afebrile and hemodynamically stable - mucolytics, bronchodilators and cough suppressants ordered  - Dr. Irwin Brakeman communicated with radiation oncologist Dr. Tammi Klippel, he is offering to give 10 palliative  radiation treatments directed to tumor in lung if patient is agreeable and would want patient transferred to [facility].  I spoke with patient and she needs time to decide and speak with her family.  -06/30/22--pt is agreeable for transfer to [facility]   Hypokalemia - supplement magnesium and potassium - recheck in AM    Thrombocytosis   - reactive--trending down with abx   Stage IV NSC adenocarcinoma of lungs - pt opted out of systemic treatments and immunotherapy - currently receiving radiation treatments  - functional decline in last 2 weeks - pt is tearful and crying out today on admission to hospital  - recommending ongoing palliative discussions regarding goals of care and code status - palliative following--continue full scope of care   Right sided weakness due to brains mets, vasogenic edema  - resume keppra 500 mg BID - did not bring home medand patient and family do not know what meds s is taking - she finished dexamethasone taper from last hospitalization - 4/1 CT brain--decreased edema and decreased midline shift   Tobacco  - nicotine patch prn    Anemia in neoplastic disease - anticipate Hg will trend down from hemodilution - recheck CBC in AM    Severe protein calorie malnutrition  - consult dietitian -continue Ensure   Nausea and vomiting - antinausea medication ordered  -emesis is improved     DVT prophylaxis:  heparin Code Status: Full  Family Communication: none present  Disposition: Status is: Transfer to [facility] for XRT     Consultants:  Palliative care Radiation oncology ([place]) Procedures:    Antimicrobials:  Ceftriaxone 4/1>> Azithromycin 4/1>>           Subjective: Pt states R-side weakness is a little better.  States n/v are improving.  Denies cp, sob, f/c, headache  Objective: Vitals:   06/29/22 1948 06/29/22 2013 06/30/22 0344 06/30/22 0708  BP:  117/70 102/65   Pulse:  91 83   Resp:  18 18   Temp:  99.1 F (37.3 C) 98.7 F (37.1 C)   TempSrc:      SpO2: 95% 100% 100% 99%  Weight:      Height:        Intake/Output Summary (Last 24 hours) at 06/30/2022 1016 Last data filed at 06/30/2022 0543 Gross per 24 hour  Intake 1853.62 ml  Output --  Net 1853.62 ml   Weight change:  Exam:  General:  Pt is alert, follows commands appropriately, not in  acute distress HEENT: No icterus, No thrush, No neck mass, [place]/AT Cardiovascular: RRR, S1/S2, no rubs, no gallops Respiratory: bibasilar rales.  Diminished BS Abdomen: Soft/+BS, non tender, non distended, no guarding Extremities: trace LE edema, No lymphangitis, No petechiae, No rashes, no synovitis   Data Reviewed: I have personally reviewed following labs and imaging studies Basic Metabolic Panel: Recent Labs  Lab 06/28/22 0914 06/29/22 0456  NA 137 138  K 3.3* 3.4*  CL 101 107  CO2 24 24  GLUCOSE 87 78  BUN 5* <5*  CREATININE 0.66 0.59  CALCIUM 8.6* 8.0*  MG  --  1.7   Liver Function Tests: Recent Labs  Lab 06/28/22 0914  AST 16  ALT 11  ALKPHOS 70  BILITOT 0.9  PROT 6.6  ALBUMIN 2.8*   Recent Labs  Lab 06/28/22 0914  LIPASE 24   No results for input(s): "AMMONIA" in the last 168 hours. Coagulation Profile: No results for input(s): "INR", "PROTIME" in the last 168 hours. CBC: Recent  Labs  Lab 06/28/22 0914 06/29/22 0456  WBC 8.1 6.4  NEUTROABS 5.3  --   HGB 11.4* 9.7*  HCT 35.4* 30.9*  MCV 88.1 88.8  PLT 522* 447*   Cardiac Enzymes: No results for input(s): "CKTOTAL", "CKMB", "CKMBINDEX", "TROPONINI" in the last 168 hours. BNP: Invalid input(s): "POCBNP" CBG: No results for input(s): "GLUCAP" in the last 168 hours. HbA1C: No results for input(s): "HGBA1C" in the last 72 hours. Urine analysis:    Component Value Date/Time   COLORURINE YELLOW 06/28/2022 1409   APPEARANCEUR CLEAR 06/28/2022 1409   LABSPEC >1.046 (H) 06/28/2022 1409   PHURINE 6.0 06/28/2022 1409   GLUCOSEU NEGATIVE 06/28/2022 1409   HGBUR SMALL (A) 06/28/2022 1409   BILIRUBINUR NEGATIVE 06/28/2022 1409   KETONESUR 20 (A) 06/28/2022 1409   PROTEINUR NEGATIVE 06/28/2022 1409   NITRITE NEGATIVE 06/28/2022 1409   LEUKOCYTESUR NEGATIVE 06/28/2022 1409   Sepsis Labs: @LABRCNTIP (procalcitonin:4,lacticidven:4) ) Recent Results (from the past 240 hour(s))  Culture, blood  (routine x 2)     Status: None (Preliminary result)   Collection Time: 06/28/22  2:00 PM   Specimen: BLOOD RIGHT ARM  Result Value Ref Range Status   Specimen Description   Final    BLOOD RIGHT ARM Performed at [facility], [address], [place], [place] [address]    Special Requests   Final    BOTTLES DRAWN AEROBIC AND ANAEROBIC Blood Culture results may not be optimal due to an excessive volume of blood received in culture bottles Performed at [facility], [address] [place], [place] [address]    Culture   Final    NO GROWTH 2 DAYS Performed at [facility], [place] [address] [place], [place] [address]    Report Status PENDING  Incomplete  Culture, blood (routine x 2)     Status: None (Preliminary result)   Collection Time: 06/28/22  2:18 PM   Specimen: BLOOD LEFT HAND  Result Value Ref Range Status   Specimen Description   Final    BLOOD LEFT HAND Performed at [facility], [address], [place], [place] [address]    Special Requests   Final    BOTTLES DRAWN AEROBIC ONLY Blood Culture results may not be optimal due to an inadequate volume of blood received in culture bottles Performed at [facility], [address], [place], [place] [address]    Culture   Final    NO GROWTH 2 DAYS Performed at [facility], [place] [address] [place],  [address]    Report Status PENDING  Incomplete     Scheduled Meds:  guaiFENesin  600 mg Oral BID   heparin  5,000 Units Subcutaneous Q8H   ipratropium-albuterol  3 mL Nebulization BID   levETIRAcetam  500 mg Oral BID   pantoprazole  40 mg Oral Daily   polyethylene glycol  17 g Oral Daily   potassium chloride  20 mEq Oral Once   Continuous Infusions:  azithromycin Stopped (06/29/22 1606)   cefTRIAXone (ROCEPHIN)  IV Stopped (06/29/22 1607)    Procedures/Studies: CT CHEST ABDOMEN PELVIS W CONTRAST  Result Date: 06/28/2022 CLINICAL DATA:  Abdominal pain EXAM: CT CHEST, ABDOMEN, AND PELVIS WITH CONTRAST  TECHNIQUE: Multidetector CT imaging of the chest, abdomen and pelvis was performed following the standard protocol during bolus administration of intravenous contrast. RADIATION DOSE REDUCTION: This exam was performed according to the departmental dose-optimization program which includes automated exposure control, adjustment of the mA and/or kV according to patient  size and/or use of iterative reconstruction technique. CONTRAST:  37mL OMNIPAQUE IOHEXOL 300 MG/ML  SOLN COMPARISON:  CT chest done on 04/06/2022 FINDINGS: CT CHEST FINDINGS Cardiovascular: Coronary artery calcifications are seen. Mediastinum/Nodes: Subcentimeter nodes in the mediastinum have not changed significantly. Right lobe of thyroid is larger than left. There is inhomogeneous enhancement and coarse calcifications in thyroid. Similar findings were seen on the previous study. Lungs/Pleura: There is 4.5 x 3 cm mixed density lesion in left hilum with no significant interval change. This lesion is inseparable from the left lower lobe bronchus. Small patchy infiltrates are seen in left upper lobe, lingula and left lower lobe with interval worsening. There is ectasia of the bronchi in the lingula and left lower lobe. There is no pleural effusion or pneumothorax. Musculoskeletal: No acute findings are seen in bony structures in the thorax. Deformity in anterolateral aspect of right eighth rib may suggest healed or healing fracture. CT ABDOMEN PELVIS FINDINGS Hepatobiliary: There is 2 cm low-density in anterior live close to falciform ligament, possibly benign process such as aberrant venous drainage or space-occupying neoplastic process. There is no dilation of bile ducts. Gallbladder is unremarkable. Pancreas: There is minimal prominence of pancreatic duct. No focal abnormalities are seen. Spleen: Spleen appears smaller than usual in size. Adrenals/Urinary Tract: Adrenals are unremarkable. There is no hydronephrosis. Right renal pelvis is prominent in  size. There are no renal or ureteral stones. There is a 9 mm low-density in the upper pole of right kidney. There is 1 cm low-density in the posterior midportion of the right kidney. Beam hardening artifacts limit evaluation of the urinary bladder. Stomach/Bowel: Stomach is unremarkable. Small bowel loops are not dilated. Appendix is not seen. There is no significant wall thickening in colon. There is no pericolic stranding. Vascular/Lymphatic: There are scattered atherosclerotic plaques and calcifications in aorta and its major branches. Reproductive: There is inhomogeneous appearance in myometrium suggesting possible uterine fibroids. Evaluation is limited by beam hardening artifacts. Other: There is no ascites or pneumoperitoneum. Musculoskeletal: No acute findings are seen. There is previous right hip arthroplasty. IMPRESSION: There is a 4.5 x 3 cm central lesion in left hilum consistent with a neoplastic process. There are patchy infiltrates in left mid and left lower lung fields suggesting pneumonia with interval worsening. No new significant lymphadenopathy is seen. There is no evidence of intestinal obstruction or pneumoperitoneum. There is no hydronephrosis. There is 2 cm low-density in anterior aspect of liver close to the falciform ligament which may suggest benign process such as aberrant venous drainage or space-occupying neoplastic process. Short-term follow-up CT along with MRI if warranted may be considered. Arteriosclerosis.  Coronary artery calcifications are seen. Possible small renal cysts. Possible uterine fibroids. There is inhomogeneous attenuation in thyroid. Right lobe of thyroid is larger than left. Other findings as described in the body of the report. Electronically Signed   By: Elmer Picker M.D.   On: 06/28/2022 13:34   CT Head Wo Contrast  Result Date: 06/28/2022 CLINICAL DATA:  History of brain cancer. Right arm and leg weakness for 2 weeks. EXAM: CT HEAD WITHOUT CONTRAST  TECHNIQUE: Contiguous axial images were obtained from the base of the skull through the vertex without intravenous contrast. RADIATION DOSE REDUCTION: This exam was performed according to the departmental dose-optimization program which includes automated exposure control, adjustment of the mA and/or kV according to patient size and/or use of iterative reconstruction technique. COMPARISON:  CT head 05/24/2022 FINDINGS: Brain: The 1.8 cm TV x 1.4 cm AP  x 2.0 cm cc cystic lesion in the left frontal lobe is not significantly changed in size. Extensive surrounding vasogenic edema is again seen throughout much of the left cerebral hemisphere which appears overall stable to slightly decreased with slightly decreased rightward midline shift now measuring 7 mm (previously measured 10 mm). The cystic lesion in the right cerebellar hemisphere appears decreased in size common best appreciated in the coronal plane where it measures up to 0.8 cm TV x 0.5 cm cc (previously measured 1.8 cm x 0.9 cm). There is decreased surrounding edema without effacement of fourth ventricle. Previously seen acute blood in the left temporal occipital region is decreased in conspicuity with decreased size of the associated cystic lesion. There is no new acute intracranial hemorrhage or extra-axial fluid collection. There is no evidence of developing hydrocephalus. Vascular: No hyperdense vessel or unexpected calcification. Skull: Normal. Negative for fracture or focal lesion. Sinuses/Orbits: The imaged paranasal sinuses are clear. The globes and orbits are unremarkable. Other: None. IMPRESSION: 1. Decreased size of the right cerebellar hemisphere and left temporal occipital metastatic lesions with resolved blood products seen on the prior CT. 2. No significant interval change in size of the largest lesion in the left frontal lobe with stable to slightly decreased surrounding edema and slightly decreased rightward midline shift currently measuring 7  mm. 3. No new hemorrhage. Electronically Signed   By: Valetta Mole M.D.   On: 06/28/2022 13:19   DG Chest Port 1 View  Result Date: 06/28/2022 CLINICAL DATA:  Weakness EXAM: PORTABLE CHEST 1 VIEW COMPARISON:  05/23/2022 FINDINGS: Unremarkable cardiac silhouette. No pneumothorax or pleural effusion. Right lung is clear. Left lower hemithorax demonstrates mild diffuse density that may represent a developing pneumonia. IMPRESSION: Possible early pneumonia left base. Otherwise no acute cardiopulmonary process. Electronically Signed   By: Sammie Bench M.D.   On: 06/28/2022 09:05    Orson Eva, DO  [facility]  If 7PM-7AM, please contact night-coverage [email or web address] Password [email or web address] 06/30/2022, 10:16 AM   LOS: 2 days

## 2022-06-30 NOTE — Progress Notes (Signed)
  Radiation Oncology         [phone] ________________________________  Name: Sandra Horne MRN: [id number]  Date: 07/01/2022  DOB: May 22, 1955  INPATIENT  SIMULATION AND TREATMENT PLANNING NOTE    ICD-10-CM   1. Primary cancer of left lower lobe of lung  C34.32       DIAGNOSIS:  67 yo woman with post-obstructive pneumonia secondary to Stage IV adenocarcinoma of the LLL lung.   NARRATIVE:  The patient was brought to the [facility].  Identity was confirmed.  All relevant records and images related to the planned course of therapy were reviewed.  The patient freely provided informed written consent to proceed with treatment after reviewing the details related to the planned course of therapy. The consent form was witnessed and verified by the simulation staff.  Then, the patient was set-up in a stable reproducible  supine position for radiation therapy.  CT images were obtained.  Surface markings were placed.  The CT images were loaded into the planning software.  Then the target and avoidance structures were contoured.  Treatment planning then occurred.  The radiation prescription was entered and confirmed.  Then, I designed and supervised the construction of a total of 6 medically necessary complex treatment devices, including a BodyFix immobilization mold custom fitted to the patient along with multileaf collimators conformally shaped radiation around the treatment target while shielding critical structures such as the heart and spinal cord maximally.  I have requested : 3D Simulation  I have requested a DVH of the following structures: Left lung, right lung, spinal cord, heart, esophagus, and target.  I have ordered:Nutrition Consult  PLAN:  The patient will receive 30 Gy in 10 fractions.  I anticipate 80% chance of alleviation of post-obstructive pneumonia to help improve quality-of-life *  ________________________________  Sheral Apley. Tammi Klippel, M.D.    *  [email or web address]

## 2022-07-01 ENCOUNTER — Ambulatory Visit
Admission: RE | Admit: 2022-07-01 | Discharge: 2022-07-01 | Disposition: A | Discharge status: Home / Self Care | Payer: 59 | Source: Ambulatory Visit | Attending: Radiation Oncology | Admitting: Radiation Oncology

## 2022-07-01 ENCOUNTER — Other Ambulatory Visit: Payer: Self-pay

## 2022-07-01 DIAGNOSIS — J189 Pneumonia, unspecified organism: Secondary | ICD-10-CM | POA: Diagnosis not present

## 2022-07-01 DIAGNOSIS — J181 Lobar pneumonia, unspecified organism: Secondary | ICD-10-CM | POA: Diagnosis not present

## 2022-07-01 DIAGNOSIS — C3432 Malignant neoplasm of lower lobe, left bronchus or lung: Secondary | ICD-10-CM | POA: Insufficient documentation

## 2022-07-01 DIAGNOSIS — Z510 Encounter for antineoplastic radiation therapy: Secondary | ICD-10-CM | POA: Insufficient documentation

## 2022-07-01 DIAGNOSIS — C7931 Secondary malignant neoplasm of brain: Secondary | ICD-10-CM | POA: Insufficient documentation

## 2022-07-01 DIAGNOSIS — C3490 Malignant neoplasm of unspecified part of unspecified bronchus or lung: Secondary | ICD-10-CM | POA: Diagnosis not present

## 2022-07-01 DIAGNOSIS — E876 Hypokalemia: Secondary | ICD-10-CM | POA: Diagnosis not present

## 2022-07-01 LAB — RAD ONC ARIA SESSION SUMMARY
Course Elapsed Days: 0
Plan Fractions Treated to Date: 1
Plan Prescribed Dose Per Fraction: 3 Gy
Plan Total Fractions Prescribed: 10
Plan Total Prescribed Dose: 30 Gy
Reference Point Dosage Given to Date: 3 Gy
Reference Point Session Dosage Given: 3 Gy
Session Number: 1

## 2022-07-01 LAB — CBC
HCT: 30.3 % — ABNORMAL LOW (ref 36.0–46.0)
Hemoglobin: 9.8 g/dL — ABNORMAL LOW (ref 12.0–15.0)
MCH: 28.2 pg (ref 26.0–34.0)
MCHC: 32.3 g/dL (ref 30.0–36.0)
MCV: 87.3 fL (ref 80.0–100.0)
Platelets: 459 10*3/uL — ABNORMAL HIGH (ref 150–400)
RBC: 3.47 MIL/uL — ABNORMAL LOW (ref 3.87–5.11)
RDW: 16.2 % — ABNORMAL HIGH (ref 11.5–15.5)
WBC: 8.3 10*3/uL (ref 4.0–10.5)
nRBC: 0 % (ref 0.0–0.2)

## 2022-07-01 LAB — BASIC METABOLIC PANEL
Anion gap: 7 (ref 5–15)
BUN: <5 mg/dL — ABNORMAL LOW (ref 8–23)
CO2: 23 mmol/L (ref 22–32)
Calcium: 8.2 mg/dL — ABNORMAL LOW (ref 8.9–10.3)
Chloride: 109 mmol/L (ref 98–111)
Creatinine, Ser: 0.58 mg/dL (ref 0.44–1.00)
GFR, Estimated: >60 mL/min (ref >60)
Glucose, Bld: 82 mg/dL (ref 70–99)
Potassium: 3.7 mmol/L (ref 3.5–5.1)
Sodium: 139 mmol/L (ref 135–145)

## 2022-07-01 MED ORDER — OXYCODONE HCL 5 MG PO TABS
5.0000 mg | ORAL_TABLET | ORAL | Status: DC | PRN
  Administered 2022-07-01 – 2022-07-02 (×3): 5 mg via ORAL
  Filled 2022-07-01 (×3): qty 1

## 2022-07-01 NOTE — Progress Notes (Addendum)
PROGRESS NOTE   Sandra Horne  [id number]    DOB: 11/05/1955    DOA: 06/28/2022  PCP: Pcp, No   I have briefly reviewed patients previous medical records in [facility].  Chief Complaint  Patient presents with   Numbness    Brief Narrative:  67 year old female with medical history significant for stage IV non-small cell lung cancer, adenocarcinoma, brain metastasis, s/p SRS to multiple brain mets undergoing radiation with 2 more treatments pending, patient has declined systemic therapy and immunotherapy despite advised by her oncologist that her disease could progress rapidly.  Oncologist and November 2023 had recommended palliative care follow-up.  Since February 2023, hospitalized x 2, firstly for new metastatic brain lesions on MRI, second time for new hemorrhage and left occipital lobe, metastatic lesion with extensive vasogenic edema with mass effect.  Neurosurgery consulted, patient was then transferred to [facility] to complete her 10th dose of radiation treatment.  Since then she was discharged on prolonged taper of Decadron and Keppra, she declined SNF and went home with family with home health services.  Palliative care team has met with patient and family multiple times for goals of care discussions but they insist on continuing full code and full scope of treatment and continuing radiation treatments.  Now presented initially to [facility] on 4/1 with complaints of right arm and leg weakness x 2 weeks, inability to eat, drink liquids, nauseous, cough with occasional shortness of breath without hemoptysis.  CT C/A/P consistent with postobstructive pneumonia.  Palliative care consulted again, continued full code and full scope.  Patient then transferred to [facility] on 4/3, radiation oncology consulted and starting palliative XRT on 4/4 for postobstructive pneumonia.   Assessment & Plan:  Principal Problem:   Pneumonia Active Problems:   Rt  Sided Weakness due to Vasogenic Brain edema   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 ([place])   Hypokalemia   Tobacco abuse   Primary cancer of left lower lobe of lung   Malignant neoplasm metastatic to brain   Right sided weakness   Hypoalbuminemia   Anemia in neoplastic disease   Thrombocytosis   Protein-calorie malnutrition, severe   Postobstructive pneumonia: CT C/A/P on 06/28/2022: 4.5 x 3 cm lesion left hilum inseparable from LLL bronchus with worsening patchy infiltrates.  There is ectasia of the bronchi in the lingula and left lower lobe.  Started empirically on IV ceftriaxone and azithromycin.  As discussed with ID pharmacist on 4/4: completed 3 days of azithromycin and discontinued, total 7 days of antibiotics.  Continue IV ceftriaxone while hospitalized and can transition to Augmentin at discharge.  Supportive care.  Radiation oncology consultation appreciated and starting palliative XRT on 4/4 for postobstructive pneumonia.  Addendum: As per communication with Dr. Tammi Klippel, radiation oncology: Patient completed brain treatment in February 2024.  He also indicated that her chest XRT can ideally be completed as outpatient, but, the reality with her has been that she struggles to come in for outpatient treatments.  Will need to discuss further with family and TOC.  Hypokalemia Replaced.  Magnesium 1.7.   Thrombocytosis  Reactive and stable in the mid 400 range.   Stage IV NSC adenocarcinoma of lungs History as noted above.  Patient has opted out of systemic treatments and immunotherapy.  Currently receiving XRT to brain and chest.  Palliative care consulted this admission and patient and family have decided full code and full scope of care.  Overall  poor prognosis.   Right sided weakness due to brains mets, vasogenic edema  Back on Keppra 500 Mg twice daily.  It appears that her home med rec could not be adequately completed on admission because family did  not bring her home meds for review.  Will request pharmacy to review her home meds.  Completed dexamethasone taper during prior hospitalization.  CT head 4/1 showed decreased edema and decreased midline shift.   Tobacco abuse - nicotine patch prn    Anemia in neoplastic disease Hemoglobin stable in the mid 9 g range.   Nausea and vomiting No reports of the over the last 24 hours.  Supportive care.  Adult failure to thrive: Multifactorial due to advanced oncological diagnosis, either underlying cognitive impairment or overall poor insight into her medical condition/poor healthcare literacy and multiple comorbidities.  Body mass index is 20.71 kg/m.  Nutritional Status Nutrition Problem: Severe Malnutrition Etiology: cancer and cancer related treatments Signs/Symptoms: energy intake < or equal to 75% for > or equal to 1 month Percent weight loss: 17 % (17% x 2 months) Interventions: Ensure Enlive (each supplement provides 350kcal and 20 grams of protein), MVI, Liberalize Diet, Education   DVT prophylaxis: heparin injection 5,000 Units Start: 06/28/22 2200     Code Status: Full Code:  ACP Documents: None present. Family Communication: Unable to reach spouse via phone.  Discussed in detail with patient's daughter via phone, updated care and answered all questions.  Advised her that pending discussion with radiation oncology, patient may be able to discharge tomorrow provided she is able to come daily as an outpatient to the oncology center for her continued radiation treatment.  Daughter indicated that she will be able to do that as long as transport is arranged as during prior radiation treatments. Disposition:  Status is: Inpatient Remains inpatient appropriate because: Starting chest XRT for postobstructive pneumonia.  Will communicate with radiation oncology regarding how long she will need to be in the hospital for this.  Also on IV antibiotics.     Consultants:   Radiation  oncology.  Procedures:     Antimicrobials:   As noted above.   Subjective:  Seen this morning, had return from radiation planning.  States that she is supposed to start radiation treatment from this afternoon.  Denied new complaints.  Objective:   Vitals:   06/30/22 2113 07/01/22 0111 07/01/22 0538 07/01/22 0902  BP: 116/68 102/74 107/66   Pulse: 96 82 85   Resp: 16 14 14    Temp: 99.1 F (37.3 C) 98.6 F (37 C) 99.1 F (37.3 C)   TempSrc: Oral Oral Oral   SpO2: 90% 97% 97% 93%  Weight:      Height:        General exam: Middle-age female, moderately built and poorly nourished lying comfortably supine in bed without distress.  Appears to be in good spirits. Respiratory system: Reduced breath sounds the left base with occasional left basal crackles but otherwise clear to auscultation without wheezing or rhonchi.  No increased work of breathing. Cardiovascular system: S1 & S2 heard, RRR. No JVD, murmurs, rubs, gallops or clicks. No pedal edema. Gastrointestinal system: Abdomen is nondistended, soft and nontender. No organomegaly or masses felt. Normal bowel sounds heard. Central nervous system: Alert and oriented. No focal neurological deficits. Extremities: Symmetric 5 x 5 power. Skin: No rashes, lesions or ulcers Psychiatry: Judgement and insight impaired. Mood & affect appropriate.     Data Reviewed:   I have personally reviewed following  labs and imaging studies   CBC: Recent Labs  Lab 06/28/22 0914 06/29/22 0456 07/01/22 0612  WBC 8.1 6.4 8.3  NEUTROABS 5.3  --   --   HGB 11.4* 9.7* 9.8*  HCT 35.4* 30.9* 30.3*  MCV 88.1 88.8 87.3  PLT 522* 447* 459*    Basic Metabolic Panel: Recent Labs  Lab 06/28/22 0914 06/29/22 0456 07/01/22 0612  NA 137 138 139  K 3.3* 3.4* 3.7  CL 101 107 109  CO2 24 24 23   GLUCOSE 87 78 82  BUN 5* <5* <5*  CREATININE 0.66 0.59 0.58  CALCIUM 8.6* 8.0* 8.2*  MG  --  1.7  --     Liver Function Tests: Recent Labs  Lab  06/28/22 0914  AST 16  ALT 11  ALKPHOS 70  BILITOT 0.9  PROT 6.6  ALBUMIN 2.8*    CBG: No results for input(s): "GLUCAP" in the last 168 hours.  Microbiology Studies:   Recent Results (from the past 240 hour(s))  Culture, blood (routine x 2)     Status: None (Preliminary result)   Collection Time: 06/28/22  2:00 PM   Specimen: BLOOD RIGHT ARM  Result Value Ref Range Status   Specimen Description BLOOD RIGHT ARM  Final   Special Requests   Final    BOTTLES DRAWN AEROBIC AND ANAEROBIC Blood Culture results may not be optimal due to an excessive volume of blood received in culture bottles   Culture   Final    NO GROWTH 3 DAYS Performed at [facility], [address] [place], [place] [address]    Report Status PENDING  Incomplete  Culture, blood (routine x 2)     Status: None (Preliminary result)   Collection Time: 06/28/22  2:18 PM   Specimen: BLOOD LEFT HAND  Result Value Ref Range Status   Specimen Description BLOOD LEFT HAND  Final   Special Requests   Final    BOTTLES DRAWN AEROBIC ONLY Blood Culture results may not be optimal due to an inadequate volume of blood received in culture bottles   Culture   Final    NO GROWTH 3 DAYS Performed at [facility], [address], [place], [place] [address]    Report Status PENDING  Incomplete    Radiology Studies:  No results found.  Scheduled Meds:    heparin  5,000 Units Subcutaneous Q8H   ipratropium-albuterol  3 mL Nebulization BID   levETIRAcetam  500 mg Oral BID   pantoprazole  40 mg Oral Daily   polyethylene glycol  17 g Oral Daily    Continuous Infusions:    cefTRIAXone (ROCEPHIN)  IV 1 g (06/30/22 1446)     LOS: 3 days     Vernell Leep, MD,  FACP, FHM, SFHM, [facility], [place]     To contact the attending provider between 7A-7P or the covering provider during after hours 7P-7A, please log into the web site [email or web address] and access using  universal [place] password for that web site. If you do not have the password, please call the hospital operator.  07/01/2022, 12:58 PM

## 2022-07-01 NOTE — Progress Notes (Signed)
PT Cancellation Note  Patient Details Name: Sandra Horne MRN: [id number] DOB: 1955-08-23   Cancelled Treatment:    Reason Eval/Treat Not Completed: Fatigue/ limiting ability to participate Patient  states she has had a  busy day. Will check back tomorrow. [place] Office [phone] Weekend [id number]   Claretha Cooper 07/01/2022, 4:44 PM

## 2022-07-01 NOTE — Care Management Important Message (Signed)
Important Message  Patient Details IM Letter placed in Patient's room. Name: Sandra Horne MRN: [id number] Date of Birth: [birth year]   Medicare Important Message Given:  Yes     Kerin Salen 07/01/2022, 9:59 AM

## 2022-07-02 ENCOUNTER — Ambulatory Visit
Admission: RE | Admit: 2022-07-02 | Discharge: 2022-07-02 | Disposition: A | Discharge status: Home / Self Care | Payer: 59 | Source: Ambulatory Visit | Attending: Radiation Oncology | Admitting: Radiation Oncology

## 2022-07-02 ENCOUNTER — Other Ambulatory Visit: Payer: Self-pay

## 2022-07-02 DIAGNOSIS — J189 Pneumonia, unspecified organism: Secondary | ICD-10-CM | POA: Diagnosis not present

## 2022-07-02 LAB — RAD ONC ARIA SESSION SUMMARY
Course Elapsed Days: 1
Plan Fractions Treated to Date: 1
Plan Prescribed Dose Per Fraction: 3 Gy
Plan Total Fractions Prescribed: 9
Plan Total Prescribed Dose: 27 Gy
Reference Point Dosage Given to Date: 6 Gy
Reference Point Session Dosage Given: 3 Gy
Session Number: 2

## 2022-07-02 MED ORDER — OXYCODONE HCL 5 MG PO TABS: 5.0000 mg | ORAL_TABLET | Freq: Three times a day (TID) | ORAL | 0 refills | Status: DC | PRN

## 2022-07-02 MED ORDER — GUAIFENESIN-DM 100-10 MG/5ML PO SYRP: 5.0000 mL | ORAL_SOLUTION | ORAL | 0 refills | Status: DC | PRN

## 2022-07-02 MED ORDER — AMOXICILLIN-POT CLAVULANATE 875-125 MG PO TABS: 1.0000 | ORAL_TABLET | Freq: Two times a day (BID) | ORAL | 0 refills | Status: AC

## 2022-07-02 MED ORDER — LEVETIRACETAM 500 MG PO TABS: 500.0000 mg | ORAL_TABLET | Freq: Two times a day (BID) | ORAL | 1 refills | Status: DC

## 2022-07-02 MED ORDER — ACETAMINOPHEN 325 MG PO TABS: 650.0000 mg | ORAL_TABLET | Freq: Four times a day (QID) | ORAL | Status: DC | PRN

## 2022-07-02 MED ORDER — NICOTINE 14 MG/24HR TD PT24: 14.0000 mg | MEDICATED_PATCH | Freq: Every day | TRANSDERMAL | 0 refills | Status: DC | PRN

## 2022-07-02 NOTE — TOC Progression Note (Addendum)
Transition of Care [facility]) - Progression Note    Patient Details  Name: Sandra Horne MRN: [id number] Date of Birth: 1956-01-24  Transition of Care [facility]) CM/SW Contact  Beckie Busing, RN Phone [phone]  07/02/2022, 1:04 PM  Clinical Narrative:    TOC following patient for disposition needs. Patient is from home with her daughter. MD at bedside requesting that CM explore transportation options for radiation treatments per daughters request. Patient states that she does have transportation with [facility] through her medicaid insurance benefits and that [facility] picks her up for radiation treatments and returns her home. Patient states that her daughter didn't schedule and [facility] didn't pick her up for treatments. CM spoke with daughter [place] who states that she is aware of transportation through [employer] and there was an incident that her mother didn't want to go due to not feeling well and [facility] never returned for a pickup after that day. CM has advised patient and daughter that patient and or family will need to cal to schedule pickups. Both verbalized understanding. Daughter states that she is concerned that daily radiation treatments wear the patient out and would like treatments 3-4 times per week. Message has been set to update MD.   CM discussed home health with the patient. Patient has been previously set up with [facility] for home health. [facility] has made several attempts to contact patient for home health and has been unsuccessful; in reaching patient or family. CM made patient aware that Home health agency has been unsuccessful in reaching her. Patient states that she will answer this time.   requesting CM set patient up with PCP. CM called [facility] and the earliest appt with MD Accepting new patients is Wed. June 12. MD notified AVS updated. Patient has been advised that she is able to call for earlier appointment is needed.   Expected Discharge Plan: Home/Self  Care Barriers to Discharge: No Barriers Identified  Expected Discharge Plan and Services In-house Referral: NA Discharge Planning Services: CM Consult Post Acute Care Choice: NA Living arrangements for the past 2 months: Single Family Home                 DME Arranged: N/A DME Agency: NA       HH Arranged: NA HH Agency: NA         Social Determinants of Health (SDOH) Interventions SDOH Screenings   Food Insecurity: No Food Insecurity (06/30/2022)  Housing: Low Risk  (06/30/2022)  Transportation Needs: No Transportation Needs (06/30/2022)  Recent Concern: Transportation Needs - Unmet Transportation Needs (05/08/2022)  Utilities: Not At Risk (06/30/2022)  Financial Resource Strain: Low Risk  (01/14/2020)  Physical Activity: Inactive (01/14/2020)  Social Connections: Socially Isolated (01/14/2020)  Stress: No Stress Concern Present (01/14/2020)  Tobacco Use: High Risk (06/30/2022)    Readmission Risk Interventions    07/02/2022   12:14 PM 05/26/2022   12:55 PM 10/14/2021   10:00 AM  Readmission Risk Prevention Plan  Transportation Screening Complete Complete Complete  PCP or Specialist Appt within 5-7 Days  Complete Not Complete  PCP or Specialist Appt within 3-5 Days Complete    Home Care Screening  Complete Complete  Medication Review (RN CM)  Complete Complete  HRI or Home Care Consult Complete    Social Work Consult for Recovery Care Planning/Counseling Complete    Palliative Care Screening Complete    Medication Review Oceanographer) Complete

## 2022-07-02 NOTE — Discharge Summary (Addendum)
Physician Discharge Summary  Sandra Horne XMI:[id number] DOB: 24-Feb-1956  PCP: Pcp, No TOC was consulted to assist patient with finding a new family physician.  Patient states that she does not have a family physician.  Admitted from: Home.  Discharged to: Home  Admit date: 06/28/2022 Discharge date: 07/02/2022  Recommendations for Outpatient Follow-up:    Follow-up Information     Care, [facility] Follow up.   Specialty: Home Health Services Why: Your home health was previously set up with [facility]. The office has been unsucessful in contacting you to set up services. Please call the numbers listed above with any questions or concerns. Contact information: [address] [place] [address] [phone]         Family Physician. Schedule an appointment as soon as possible for a visit in 1 week(s).   Why: To be seen with repeat labs (CBC & BMP).        [facility] Radiation Oncology Follow up.   Specialty: Radiation Oncology Why: Patient has to come for daily radiation treatments Monday to Thursday, 4 days a week for 2 weeks from 07/05/2022. Contact information: [address] [id number] mc [address] [place] [address] [phone]        [facility]. Go on 09/08/2022.   Specialty: Family Medicine Why: You have been scheduled and appointment for Wed June 12 at 1:50pm with Gwinda Passe. You are able to call the above number to recieve an earlier appointment if necessary. Please bring photo ID and insurance card. Any copatys are due at time of service. You will recieve a reminder call prior to your appointment. Contact information: [address] [place] [address] [phone]        Si Gaul, MD. Schedule an appointment as soon as possible for a visit.   Specialty: Oncology Contact information: [address] [place]  [address] [phone]                  Home Health: Home Health Orders (From admission, onward)     Start     Ordered   07/02/22 1407  Home Health  At discharge       Question:  To provide the following care/treatments  Answer:  PT   07/02/22 1409             Equipment/Devices: None    Discharge Condition: Improved and stable.   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 (HCC)   Hypokalemia   Tobacco abuse   Primary cancer of left lower lobe of lung   Malignant neoplasm metastatic to brain   Right sided weakness   Hypoalbuminemia   Anemia in neoplastic disease   Thrombocytosis   Protein-calorie malnutrition, severe   Brief Summary: 67 year old female with medical history significant for stage IV non-small cell lung cancer, adenocarcinoma, brain metastasis, s/p SRS to multiple brain mets undergoing radiation with 2 more treatments pending, patient has declined systemic therapy and immunotherapy despite advised by  her oncologist that her disease could progress rapidly.  Oncologist and November 2023 had recommended palliative care follow-up.  Since February 2023, hospitalized x 2, firstly for new metastatic brain lesions on MRI, second time for new hemorrhage and left occipital lobe, metastatic lesion with extensive vasogenic edema with mass effect.  Neurosurgery consulted, patient was then transferred to [facility] to complete her 10th dose of radiation treatment.  Since then she was discharged on prolonged taper of Decadron and Keppra, she declined SNF and went home with family with home health services.   Palliative care team has met with patient and family multiple times for goals of care discussions but they insist on continuing full code and full scope of treatment and continuing radiation treatments.  Now presented initially to [facility] on 4/1 with complaints of right arm and leg weakness x 2 weeks, inability to eat, drink liquids, nauseous, cough with occasional shortness of breath without hemoptysis.  CT C/A/P consistent with postobstructive pneumonia.  Palliative care consulted again, continued full code and full scope.  Patient then transferred to [facility] on 4/3, radiation oncology consulted and starting palliative XRT on 4/4 for postobstructive pneumonia.     Assessment & Plan:    Postobstructive pneumonia: CT C/A/P on 06/28/2022: 4.5 x 3 cm lesion left hilum inseparable from LLL bronchus with worsening patchy infiltrates.  There is ectasia of the bronchi in the lingula and left lower lobe.  Started empirically on IV ceftriaxone and azithromycin.  As discussed with ID pharmacist on 4/4: completed 3 days of azithromycin and discontinued, total 7 days of antibiotics.  Continue IV ceftriaxone while hospitalized and can transition to Augmentin at discharge.  Supportive care.  Radiation oncology consultation appreciated and started palliative XRT on 4/4 for postobstructive pneumonia.  Completed 4 days of IV antibiotics.  Transitioned to Augmentin to complete total 7-day course.  Today is day 2 of her chest XRT.  As communicated with Dr. Kathrynn Running, she will continue outpatient XRT treatment for additional 8 doses, 4 days/week, Monday to Thursday beginning 4/8.  TOC has consulted and coordinated with family regarding outpatient transport arrangements for radiation treatments.  Patient did come for outpatient treatments in February for her brain XRT.  Patient has used a few doses of Oxy IR and provided a short supply of same for pain management not controlled by Tylenol.    Hypokalemia Replaced.  Magnesium 1.7.   Thrombocytosis  Reactive and stable in the mid 400 range.   Stage IV NSC adenocarcinoma of lungs History as noted above.  Patient has opted out of systemic treatments and immunotherapy.  Currently receiving XRT to chest.  As per communication with Dr. Kathrynn Running, she completed her brain XRT in February as an outpatient.  Palliative care consulted this admission and patient and family have decided full code and full scope of care.  Overall poor prognosis.   Right sided weakness due to brains mets, vasogenic edema  Back on Keppra 500 Mg twice daily.  It appears that her home med rec could not be adequately completed on admission because family did not bring her home meds for review. Completed dexamethasone taper during prior hospitalization.  CT head 4/1 showed decreased edema and decreased midline shift.  Really unable to make out from chart review if Keppra was discontinued by a provider or she voluntarily stopped taking it by herself.  Given brain imaging findings, reasonable to continue Keppra.  This can be reviewed during outpatient follow-up by Oncology.  PT has evaluated and recommend home health PT.   Tobacco abuse - nicotine patch prn    Anemia in neoplastic disease Hemoglobin stable in the mid 9 g range.   Nausea and vomiting Resolved.  Supportive care.   Adult failure to thrive: Multifactorial due to advanced oncological diagnosis, either underlying cognitive impairment or overall poor insight into her medical condition/poor healthcare literacy and multiple comorbidities.   Body mass index is 20.71 kg/m.   Nutritional Status Nutrition Problem: Severe Malnutrition Etiology: cancer and cancer related treatments Signs/Symptoms: energy intake < or equal to 75% for > or equal to 1 month Percent weight loss: 17 % (17% x 2 months) Interventions: Ensure Enlive (each supplement provides 350kcal and 20 grams of protein), MVI, Liberalize Diet,  Education      Consultants:   Radiation oncology.   Procedures:   Chest XRT 4/4>    Discharge Instructions  Discharge Instructions     Call MD for:  difficulty breathing, headache or visual disturbances   Complete by: As directed    Call MD for:  extreme fatigue   Complete by: As directed    Call MD for:  persistant dizziness or light-headedness   Complete by: As directed    Call MD for:  persistant nausea and vomiting   Complete by: As directed    Call MD for:  severe uncontrolled pain   Complete by: As directed    Call MD for:  temperature >100.4   Complete by: As directed    Diet - low sodium heart healthy   Complete by: As directed    Increase activity slowly   Complete by: As directed         Medication List     STOP taking these medications    gabapentin 300 MG capsule Commonly known as: NEURONTIN   naproxen 375 MG tablet Commonly known as: NAPROSYN   pantoprazole 40 MG tablet Commonly known as: PROTONIX       TAKE these medications    acetaminophen 325 MG tablet Commonly known as: TYLENOL Take 2 tablets (650 mg total) by mouth every 6 (six) hours as needed for mild pain or moderate pain (or Fever >/= 101).   amoxicillin-clavulanate 875-125 MG tablet Commonly known as: AUGMENTIN Take 1 tablet by mouth 2 (two) times daily for 3 days. Start taking on: July 03, 2022   guaiFENesin-dextromethorphan 100-10 MG/5ML syrup Commonly known as: ROBITUSSIN DM Take 5 mLs by mouth every 4 (four) hours as needed for cough.   levETIRAcetam 500 MG tablet Commonly known as: KEPPRA Take 1 tablet (500 mg total) by mouth 2 (two) times daily.   nicotine 14 mg/24hr patch Commonly known as: NICODERM CQ - dosed in mg/24 hours Place 1 patch (14 mg total) onto the skin daily as needed (nicotine craving).   oxyCODONE 5 MG immediate release tablet Commonly known as: Oxy IR/ROXICODONE Take 1 tablet (5 mg total) by mouth every 8 (eight) hours as needed for severe  pain.   polyethylene glycol 17 g packet Commonly known as: MIRALAX / GLYCOLAX Take 17 g by mouth daily.       No Known Allergies    Procedures/Studies: CT CHEST ABDOMEN PELVIS W CONTRAST  Result Date: 06/28/2022 CLINICAL DATA:  Abdominal pain EXAM: CT CHEST, ABDOMEN, AND PELVIS WITH CONTRAST TECHNIQUE: Multidetector CT imaging of the chest, abdomen and pelvis was performed following the standard protocol during bolus administration of intravenous contrast. RADIATION DOSE REDUCTION: This exam was performed according to the  departmental dose-optimization program which includes automated exposure control, adjustment of the mA and/or kV according to patient size and/or use of iterative reconstruction technique. CONTRAST:  71mL OMNIPAQUE IOHEXOL 300 MG/ML  SOLN COMPARISON:  CT chest done on 04/06/2022 FINDINGS: CT CHEST FINDINGS Cardiovascular: Coronary artery calcifications are seen. Mediastinum/Nodes: Subcentimeter nodes in the mediastinum have not changed significantly. Right lobe of thyroid is larger than left. There is inhomogeneous enhancement and coarse calcifications in thyroid. Similar findings were seen on the previous study. Lungs/Pleura: There is 4.5 x 3 cm mixed density lesion in left hilum with no significant interval change. This lesion is inseparable from the left lower lobe bronchus. Small patchy infiltrates are seen in left upper lobe, lingula and left lower lobe with interval worsening. There is ectasia of the bronchi in the lingula and left lower lobe. There is no pleural effusion or pneumothorax. Musculoskeletal: No acute findings are seen in bony structures in the thorax. Deformity in anterolateral aspect of right eighth rib may suggest healed or healing fracture. CT ABDOMEN PELVIS FINDINGS Hepatobiliary: There is 2 cm low-density in anterior live close to falciform ligament, possibly benign process such as aberrant venous drainage or space-occupying neoplastic process. There is no  dilation of bile ducts. Gallbladder is unremarkable. Pancreas: There is minimal prominence of pancreatic duct. No focal abnormalities are seen. Spleen: Spleen appears smaller than usual in size. Adrenals/Urinary Tract: Adrenals are unremarkable. There is no hydronephrosis. Right renal pelvis is prominent in size. There are no renal or ureteral stones. There is a 9 mm low-density in the upper pole of right kidney. There is 1 cm low-density in the posterior midportion of the right kidney. Beam hardening artifacts limit evaluation of the urinary bladder. Stomach/Bowel: Stomach is unremarkable. Small bowel loops are not dilated. Appendix is not seen. There is no significant wall thickening in colon. There is no pericolic stranding. Vascular/Lymphatic: There are scattered atherosclerotic plaques and calcifications in aorta and its major branches. Reproductive: There is inhomogeneous appearance in myometrium suggesting possible uterine fibroids. Evaluation is limited by beam hardening artifacts. Other: There is no ascites or pneumoperitoneum. Musculoskeletal: No acute findings are seen. There is previous right hip arthroplasty. IMPRESSION: There is a 4.5 x 3 cm central lesion in left hilum consistent with a neoplastic process. There are patchy infiltrates in left mid and left lower lung fields suggesting pneumonia with interval worsening. No new significant lymphadenopathy is seen. There is no evidence of intestinal obstruction or pneumoperitoneum. There is no hydronephrosis. There is 2 cm low-density in anterior aspect of liver close to the falciform ligament which may suggest benign process such as aberrant venous drainage or space-occupying neoplastic process. Short-term follow-up CT along with MRI if warranted may be considered. Arteriosclerosis.  Coronary artery calcifications are seen. Possible small renal cysts. Possible uterine fibroids. There is inhomogeneous attenuation in thyroid. Right lobe of thyroid is  larger than left. Other findings as described in the body of the report. Electronically Signed   By: Ernie Avena M.D.   On: 06/28/2022 13:34   CT Head Wo Contrast  Result Date: 06/28/2022 CLINICAL DATA:  History of brain cancer. Right arm and leg weakness for 2 weeks. EXAM: CT HEAD WITHOUT CONTRAST TECHNIQUE: Contiguous axial images were obtained from the base of the skull through the vertex without intravenous contrast. RADIATION DOSE REDUCTION: This exam was performed according to the departmental dose-optimization program which includes automated exposure control, adjustment of the mA and/or kV according to patient size and/or use of iterative  reconstruction technique. COMPARISON:  CT head 05/24/2022 FINDINGS: Brain: The 1.8 cm TV x 1.4 cm AP x 2.0 cm cc cystic lesion in the left frontal lobe is not significantly changed in size. Extensive surrounding vasogenic edema is again seen throughout much of the left cerebral hemisphere which appears overall stable to slightly decreased with slightly decreased rightward midline shift now measuring 7 mm (previously measured 10 mm). The cystic lesion in the right cerebellar hemisphere appears decreased in size common best appreciated in the coronal plane where it measures up to 0.8 cm TV x 0.5 cm cc (previously measured 1.8 cm x 0.9 cm). There is decreased surrounding edema without effacement of fourth ventricle. Previously seen acute blood in the left temporal occipital region is decreased in conspicuity with decreased size of the associated cystic lesion. There is no new acute intracranial hemorrhage or extra-axial fluid collection. There is no evidence of developing hydrocephalus. Vascular: No hyperdense vessel or unexpected calcification. Skull: Normal. Negative for fracture or focal lesion. Sinuses/Orbits: The imaged paranasal sinuses are clear. The globes and orbits are unremarkable. Other: None. IMPRESSION: 1. Decreased size of the right cerebellar  hemisphere and left temporal occipital metastatic lesions with resolved blood products seen on the prior CT. 2. No significant interval change in size of the largest lesion in the left frontal lobe with stable to slightly decreased surrounding edema and slightly decreased rightward midline shift currently measuring 7 mm. 3. No new hemorrhage. Electronically Signed   By: Lesia Hausen M.D.   On: 06/28/2022 13:19   DG Chest Port 1 View  Result Date: 06/28/2022 CLINICAL DATA:  Weakness EXAM: PORTABLE CHEST 1 VIEW COMPARISON:  05/23/2022 FINDINGS: Unremarkable cardiac silhouette. No pneumothorax or pleural effusion. Right lung is clear. Left lower hemithorax demonstrates mild diffuse density that may represent a developing pneumonia. IMPRESSION: Possible early pneumonia left base. Otherwise no acute cardiopulmonary process. Electronically Signed   By: Layla Maw M.D.   On: 06/28/2022 09:05      Subjective: Patient denies complaints.  No pain or dyspnea currently.  Aware that she will be discharging home after 2 days XRT.  Discharge Exam:  Vitals:   07/01/22 1916 07/01/22 2136 07/02/22 0513 07/02/22 1302  BP:  103/61 (!) 90/53 110/80  Pulse:  94 98 97  Resp:  16 16 20   Temp:  98.9 F (37.2 C) 98.4 F (36.9 C) 97.8 F (36.6 C)  TempSrc:  Oral Oral Oral  SpO2: 95% 95% 97% 97%  Weight:      Height:        General exam: Middle-age female, moderately built and poorly nourished lying comfortably supine in bed without distress.  Appears to be in good spirits. Respiratory system: Reduced breath sounds the left base with occasional left basal crackles but otherwise clear to auscultation without wheezing or rhonchi.  No increased work of breathing. Cardiovascular system: S1 & S2 heard, RRR. No JVD, murmurs, rubs, gallops or clicks. No pedal edema. Gastrointestinal system: Abdomen is nondistended, soft and nontender. No organomegaly or masses felt. Normal bowel sounds heard. Central nervous  system: Alert and oriented. No focal neurological deficits. Extremities: Symmetric 5 x 5 power. Skin: No rashes, lesions or ulcers Psychiatry: Judgement and insight impaired. Mood & affect appropriate.     The results of significant diagnostics from this hospitalization (including imaging, microbiology, ancillary and laboratory) are listed below for reference.     Microbiology: Recent Results (from the past 240 hour(s))  Culture, blood (routine x 2)  Status: None (Preliminary result)   Collection Time: 06/28/22  2:00 PM   Specimen: BLOOD RIGHT ARM  Result Value Ref Range Status   Specimen Description BLOOD RIGHT ARM  Final   Special Requests   Final    BOTTLES DRAWN AEROBIC AND ANAEROBIC Blood Culture results may not be optimal due to an excessive volume of blood received in culture bottles   Culture   Final    NO GROWTH 4 DAYS Performed at [facility], [address] [place], [place] [address]    Report Status PENDING  Incomplete  Culture, blood (routine x 2)     Status: None (Preliminary result)   Collection Time: 06/28/22  2:18 PM   Specimen: BLOOD LEFT HAND  Result Value Ref Range Status   Specimen Description BLOOD LEFT HAND  Final   Special Requests   Final    BOTTLES DRAWN AEROBIC ONLY Blood Culture results may not be optimal due to an inadequate volume of blood received in culture bottles   Culture   Final    NO GROWTH 4 DAYS Performed at [facility], [address] [place], [place] [address]    Report Status PENDING  Incomplete     Labs: CBC: Recent Labs  Lab 06/28/22 0914 06/29/22 0456 07/01/22 0612  WBC 8.1 6.4 8.3  NEUTROABS 5.3  --   --   HGB 11.4* 9.7* 9.8*  HCT 35.4* 30.9* 30.3*  MCV 88.1 88.8 87.3  PLT 522* 447* 459*    Basic Metabolic Panel: Recent Labs  Lab 06/28/22 0914 06/29/22 0456 07/01/22 0612  NA 137 138 139  K 3.3* 3.4* 3.7  CL 101 107 109  CO2 24 24 23   GLUCOSE 87 78 82  BUN 5* <5* <5*  CREATININE 0.66 0.59 0.58  CALCIUM  8.6* 8.0* 8.2*  MG  --  1.7  --     Liver Function Tests: Recent Labs  Lab 06/28/22 0914  AST 16  ALT 11  ALKPHOS 70  BILITOT 0.9  PROT 6.6  ALBUMIN 2.8*     Urinalysis    Component Value Date/Time   COLORURINE YELLOW 06/28/2022 1409   APPEARANCEUR CLEAR 06/28/2022 1409   LABSPEC >1.046 (H) 06/28/2022 1409   PHURINE 6.0 06/28/2022 1409   GLUCOSEU NEGATIVE 06/28/2022 1409   HGBUR SMALL (A) 06/28/2022 1409   BILIRUBINUR NEGATIVE 06/28/2022 1409   KETONESUR 20 (A) 06/28/2022 1409   PROTEINUR NEGATIVE 06/28/2022 1409   NITRITE NEGATIVE 06/28/2022 1409   LEUKOCYTESUR NEGATIVE 06/28/2022 1409    Discussed in detail with patient's daughter via phone, updated care and answered all questions.  Confirmed with her that patient will need to come for outpatient radiation treatments for 4 days a week Monday to Friday beginning 07/05/2022 for the next 2 weeks.  TOC has assisted patient/family with transport arrangements for outpatient radiation treatments.  Time coordinating discharge: 35 minutes  SIGNED:  Marcellus ScottAnand Meghanne Pletz, MD,  FACP, FHM, [facility] PennsylvaniaFHM, [facility] ManorCMPC, [id number]   [facility]     To contact the attending provider between 7A-7P or the covering provider during after hours 7P-7A, please log into the web site [email or web address] and access using universal  password for that web site. If you do not have the password, please call the hospital operator.

## 2022-07-02 NOTE — Evaluation (Signed)
Physical Therapy Evaluation Patient Details Name: Sandra CromeShenniel S Horne MRN: [id number] DOB: Sep 24, 1955 Today's Date: 07/02/2022  History of Present Illness  67 yo female admitted to [facility] on 4/1 due to c/o R UE and LE weakness x 2 wks, poor PO intake, nausea, cough and progressive SOB. Pt then transferred to [facility] on 4/3 due to PNA. 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  Clinical Impression  Pt admitted with above diagnosis.  Pt currently with functional limitations due to the deficits listed below (see PT Problem List). Pt in bed when PT arrived. Pt agreeable to PT intervention. Pt reports she wants to get home with her husband. Pt is S for bed mobility tasks. Pt requires min guard and cues for safety and technique with transfer tasks at RW level, min guard constant cues for posture, proper distance and body position inside RW, attention to R LE with noted motor control, coordination and strength deficits noted R LE and R UE. Pt elected to remain seated EOB and all needs met. Pt denies pain, dizziness or SOB at eval.  Pt will benefit from acute skilled PT to increase their independence and safety with mobility to allow discharge with recommendation for PT services s/p hospital d/c.          Recommendations for follow up therapy are one component of a multi-disciplinary discharge planning process, led by the attending physician.  Recommendations may be updated based on patient status, additional functional criteria and insurance authorization.  Follow Up Recommendations       Assistance Recommended at Discharge Intermittent Supervision/Assistance  Patient can return home with the following  A little help with walking and/or transfers;A little help with bathing/dressing/bathroom;Assistance with  cooking/housework;Assist for transportation;Help with stairs or ramp for entrance    Equipment Recommendations None recommended by PT (pt reports DME in home setting)  Recommendations for Other Services  OT consult    Functional Status Assessment Patient has had a recent decline in their functional status and demonstrates the ability to make significant improvements in function in a reasonable and predictable amount of time.     Precautions / Restrictions Precautions Precautions: Fall Restrictions Weight Bearing Restrictions: No      Mobility  Bed Mobility Overal bed mobility: Needs Assistance Bed Mobility: Supine to Sit     Supine to sit: Supervision          Transfers Overall transfer level: Needs assistance Equipment used: Rolling walker (2 wheels) Transfers: Sit to/from Stand Sit to Stand: Min guard           General transfer comment: pt demonstrates strong posterior lean wtih initial standing with use of posterior distal LEs for support on bed, cues for proper UE placement with R UE on RW and pushing with L UE, RW placement for pivot to retun to bed and encouragement to maintain B UE support at RW during transfer tasks    Ambulation/Gait Ambulation/Gait assistance: Min guard Gait Distance (Feet): 40 Feet Assistive device: Rolling walker (2 wheels) Gait Pattern/deviations: Decreased step length - right, Decreased step length - left, Shuffle, Narrow base of support (R LE ER and noted R rotation of body inside RW) Gait velocity: decreased     General Gait Details: pt requires extensive cues for maintaining proper body position inside RW, cues for posture, attention to R LE  and safety with turns  Information systems manager Rankin (Stroke Patients Only)       Balance Overall balance assessment: Needs assistance Sitting-balance support: Feet supported Sitting balance-Leahy Scale: Good     Standing balance support: Bilateral  upper extremity supported, During functional activity, Reliant on assistive device for balance Standing balance-Leahy Scale: Poor Standing balance comment: posterior lean                             Pertinent Vitals/Pain Pain Assessment Pain Assessment: No/denies pain    Home Living Family/patient expects to be discharged to:: Private residence Living Arrangements: Spouse/significant other Available Help at Discharge: Family (pt reports living with daughter currently but returning to [place] with Husband following hospitalization) Type of Home: House Home Access: Level entry       Home Layout: One level Home Equipment: Agricultural consultant (2 wheels);BSC/3in1;Wheelchair - manual      Prior Function Prior Level of Function : Independent/Modified Independent               ADLs Comments: pt reports mod I at wc or RW level completing ADLs and self care tasks, family assists with IADLs and driving     Hand Dominance        Extremity/Trunk Assessment   Upper Extremity Assessment Upper Extremity Assessment: Generalized weakness;Defer to OT evaluation (R UE)    Lower Extremity Assessment Lower Extremity Assessment: RLE deficits/detail RLE Deficits / Details: ankle DF 3=/5, PF 4/5, knee flexion 4-/5, knee extension 3/5 and hip flexion 3+/5 RLE Sensation: WNL RLE Coordination: decreased gross motor (impacting R LE positioning with gait tasks)       Communication   Communication: No difficulties  Cognition Arousal/Alertness: Awake/alert Behavior During Therapy: WFL for tasks assessed/performed Overall Cognitive Status: Within Functional Limits for tasks assessed                                          General Comments      Exercises     Assessment/Plan    PT Assessment Patient needs continued PT services  PT Problem List Decreased strength;Decreased range of motion;Decreased activity tolerance;Decreased balance;Decreased  mobility;Decreased coordination;Decreased knowledge of use of DME       PT Treatment Interventions DME instruction;Gait training;Functional mobility training;Therapeutic activities;Therapeutic exercise;Balance training;Neuromuscular re-education;Patient/family education    PT Goals (Current goals can be found in the Care Plan section)  Acute Rehab PT Goals Patient Stated Goal: start walking right and have some more energy PT Goal Formulation: With patient Time For Goal Achievement: 07/16/22 Potential to Achieve Goals: Good    Frequency Min 3X/week     Co-evaluation               AM-PAC PT "6 Clicks" Mobility  Outcome Measure Help needed turning from your back to your side while in a flat bed without using bedrails?: None Help needed moving from lying on your back to sitting on the side of a flat bed without using bedrails?: None Help needed moving to and from a bed to a chair (including a wheelchair)?: A Little Help needed standing up from a chair using your arms (e.g., wheelchair or bedside chair)?: A Little Help needed to walk in hospital room?: A Little Help needed climbing 3-5  steps with a railing? : A Lot 6 Click Score: 19    End of Session Equipment Utilized During Treatment: Gait belt Activity Tolerance: Patient tolerated treatment well Patient left: in bed;with call bell/phone within reach;with chair alarm set (seated EOB per pt preferance) Nurse Communication: Mobility status PT Visit Diagnosis: Unsteadiness on feet (R26.81);Other abnormalities of gait and mobility (R26.89);Muscle weakness (generalized) (M62.81);Difficulty in walking, not elsewhere classified (R26.2)    Time: 3151-7616 PT Time Calculation (min) (ACUTE ONLY): 27 min   Charges:   PT Evaluation $PT Eval Low Complexity: 1 Low PT Treatments $Gait Training: 8-22 mins        Rica Mote, PT   Jacqualyn Posey 07/02/2022, 11:42 AM

## 2022-07-02 NOTE — Discharge Instructions (Signed)

## 2022-07-03 LAB — CULTURE, BLOOD (ROUTINE X 2)
Culture: NO GROWTH
Culture: NO GROWTH

## 2022-07-05 ENCOUNTER — Encounter: Payer: Self-pay | Admitting: Urology

## 2022-07-05 ENCOUNTER — Ambulatory Visit
Admission: RE | Admit: 2022-07-05 | Discharge: 2022-07-05 | Disposition: A | Discharge status: Home / Self Care | Payer: 59 | Source: Ambulatory Visit | Attending: Radiation Oncology | Admitting: Radiation Oncology

## 2022-07-05 ENCOUNTER — Other Ambulatory Visit: Payer: Self-pay

## 2022-07-05 DIAGNOSIS — C7931 Secondary malignant neoplasm of brain: Secondary | ICD-10-CM | POA: Diagnosis not present

## 2022-07-05 DIAGNOSIS — C3432 Malignant neoplasm of lower lobe, left bronchus or lung: Secondary | ICD-10-CM | POA: Diagnosis not present

## 2022-07-05 DIAGNOSIS — Z87891 Personal history of nicotine dependence: Secondary | ICD-10-CM | POA: Diagnosis not present

## 2022-07-05 DIAGNOSIS — Z510 Encounter for antineoplastic radiation therapy: Secondary | ICD-10-CM | POA: Diagnosis not present

## 2022-07-05 LAB — RAD ONC ARIA SESSION SUMMARY
Course Elapsed Days: 4
Plan Fractions Treated to Date: 2
Plan Prescribed Dose Per Fraction: 3 Gy
Plan Total Fractions Prescribed: 9
Plan Total Prescribed Dose: 27 Gy
Reference Point Dosage Given to Date: 9 Gy
Reference Point Session Dosage Given: 3 Gy
Session Number: 3

## 2022-07-06 ENCOUNTER — Ambulatory Visit: Payer: 59

## 2022-07-06 ENCOUNTER — Inpatient Hospital Stay: Payer: 59 | Attending: Physician Assistant

## 2022-07-07 ENCOUNTER — Telehealth: Payer: Self-pay

## 2022-07-07 ENCOUNTER — Inpatient Hospital Stay: Payer: 59

## 2022-07-07 ENCOUNTER — Ambulatory Visit: Payer: 59

## 2022-07-07 NOTE — Telephone Encounter (Signed)
RN may several attempts to reach patient about todays appointment no answer and unable to leave message mailbox full.  Spoke with Sandra Horne he said he would call her that he was at work and he would call RN back.  RN called to inform Sandra Horne that appointment was cancelled for today and will resume tomorrow not answering left message.  Expressed the importance of Sandra Horne coming in tomorrow for treatment.

## 2022-07-08 ENCOUNTER — Telehealth: Payer: Self-pay

## 2022-07-08 ENCOUNTER — Ambulatory Visit: Payer: 59

## 2022-07-08 ENCOUNTER — Inpatient Hospital Stay: Payer: 59

## 2022-07-08 NOTE — Telephone Encounter (Signed)
RN called Mrs. Langseth to remind of appointment this afternoon.  Sandra Horne reports she will not be able to make today's appointment that she was headed over to see her doctor to address her swelling to hands/feet and issues with right eye.  RN will call her tomorrow to follow up on her condition and she when she will be in for treatment.  Next scheduled appointment is on 07/12/2022.

## 2022-07-08 NOTE — Progress Notes (Signed)
Palliative Medicine [facility]  [phone] [phone]   Name: Sandra Horne Date: 07/08/2022 MRN: [id number]  DOB: April 18, 1955  Patient Care Team: Pcp, No as PCP - General Pickenpack-Cousar, Arty Baumgartner, NP as Nurse Practitioner (Nurse Practitioner)   INTERVAL HISTORY: Sandra Horne is a 67 y.o. female with oncologic medical history including primary lung CA (left infrahilar mass dx Sept 2021 but lost to f/u, last radiation treatment 10/29/2021), chronic anemia, right femoral neck fracture (s/p right hemiarthroplasty on 10/13/2021) and tobacco abuse. She was recently admitted on 10/31/2021 with new right-sided weakness. CT of head revealed left parietal lobe mass with vasogenic edema. She is currently undergoing SRS.  Palliative ask to see for symptom management and goals of care.   SOCIAL HISTORY:     reports that she has been smoking cigarettes. She has never used smokeless tobacco. She reports that she does not currently use alcohol. She reports that she does not currently use drugs after having used the following drugs: Marijuana.  ADVANCE DIRECTIVES:    CODE STATUS:   PAST MEDICAL HISTORY: Past Medical History:  Diagnosis Date   Cancer    Lung and Brain mets   Lung cancer     ALLERGIES:  has No Known Allergies.  MEDICATIONS:  Current Outpatient Medications  Medication Sig Dispense Refill   acetaminophen (TYLENOL) 325 MG tablet Take 2 tablets (650 mg total) by mouth every 6 (six) hours as needed for mild pain or moderate pain (or Fever >/= 101).     guaiFENesin-dextromethorphan (ROBITUSSIN DM) 100-10 MG/5ML syrup Take 5 mLs by mouth every 4 (four) hours as needed for cough. 118 mL 0   levETIRAcetam (KEPPRA) 500 MG tablet Take 1 tablet (500 mg total) by mouth 2 (two) times daily. 60 tablet 1   nicotine (NICODERM CQ - DOSED IN MG/24 HOURS) 14 mg/24hr patch Place 1 patch (14 mg total) onto the skin daily as needed (nicotine craving).  28 patch 0   oxyCODONE (OXY IR/ROXICODONE) 5 MG immediate release tablet Take 1 tablet (5 mg total) by mouth every 8 (eight) hours as needed for severe pain. 10 tablet 0   polyethylene glycol (MIRALAX / GLYCOLAX) 17 g packet Take 17 g by mouth daily. 14 each 0   No current facility-administered medications for this visit.    VITAL SIGNS: There were no vitals taken for this visit. There were no vitals filed for this visit.  Estimated body mass index is 20.71 kg/m as calculated from the following:   Height as of 06/30/22: 5\' 6"  (1.676 m).   Weight as of 06/30/22: 128 lb 4.9 oz (58.2 kg).   PERFORMANCE STATUS (ECOG) : 1 - Symptomatic but completely ambulatory  Assessment In wheelchair, weak appearing RRR Bilateral lower extremity pitting edema R>L BS +x4, nontender, nondistended AAO x4  IMPRESSION: Sandra Horne presents to clinic for follow-up. No family present. She is alert and able to engage appropriately in discussions.  Patient was originally scheduled for radiation therapy today however canceled appointment stating she has "other things to focus on".  Confirms she is still living with her daughter at this time.  Appetite fluctuates.  Some days are better than others.  Ongoing weight loss.  Current weight is 122 pounds down from 128 pounds on 4/3 148 pounds on 2/25.  We discussed her remaining radiation treatments as patient states she is unsure if she is going to complete them and she continues to refer to "other things to  focus on".  States she wishes for time to think before making any final decisions.  Encouraged her to reach out to her radiation team if she desires to no longer finish treatment making sure she is well aware of the risk.  Pain Denies nausea, vomiting, constipation, or diarrhea.  Patient endorses ongoing pain which she describes as constant.  She becomes tearful expressing her desire to gain better relief.  Complains of pain in her hip, lower back, and lower extremity.   Reports the only thing that has helped her is pain medication which she is out of.  Has utilized heating pack with minimal relief in addition to alternating between Tylenol and ibuprofen.  Patient was previously discharged home with oxycodone 5 mg as needed.  She describes her pain as 10 out of 10.  Pain decreases to 4-5 out of 10 after medication.  Will refill oxycodone 5 mg as needed.  We discussed possible use of long-acting pain medication to offer additional pain relief however patient declines at this time and would like to reevaluate in 1-2 weeks.  Education provided on use of stool softeners in the setting of opioid use.  2.  Goals of care I created space and opportunity for Sandra Horne to discuss her thoughts and feelings on her current illness and disease.  She acknowledges reasoning for radiation therapy.  Confirms previous wishes for no systemic therapies.  She understands her cancer will continue to progress and eventually lead to end-of-life.  States she is focusing on the positive and wanting to "get better".  I had an open and direct conversation with her providing education on her disease trajectory emphasizing that the medical team and aggressively manage her symptoms and continue to offer palliative radiation by her stage IV lung cancer is not curative.  Patient verbalized understanding stating she has many different health problems which she is well aware of and is trying to prioritize what to "handle" and not.  Shares that she has been established with a PCP and she previously did not have one.  Is referring to her bilateral lower extremity edema stating she just needs the swelling to go down.  We discussed what is most important to patient.  She states to manage her pain while taking things one day at a time.  Continues to request full code/full scope care.  We discussed what a full code scenario would look like for her. I emphasize poor quality of life and minimal chance of survival  given significant disease. Kabao verbalized understanding expressing appreciation and that she would not want to heroic measures and would desire a natural death when that time comes. Education provided on DNR/DNI.  She again verbalizes understanding confirming wishes for DNR.  I approach discussions around MOST form.  Extensive education regarding artificial feedings and other life-prolonging measures.  Patient is clear that if she is nearing end-of-life she would not want her life prolonged by artificial feedings and hydration knowing she would not enjoy this as she enjoys eating by mouth.  Confirms she would not want PEG tube at any point.  I encouraged her to continue discussions with her family and the importance of limitations in the situation of further health decline.  She would like to complete documentations during follow-up allow her time to have discussions with her family.  Goals of Care (02/01/2022) I created space and opportunity for patient to express thoughts and feelings in regards to her current illness.  Sandra Horne states she is ready to  return home as she is currently living with her daughter.  Feels that she is doing much better and does not wish to pursue treatments and take the risk of feeling poorly again.  Education provided on risk of disease progression in the setting of no interventions.  She verbalized understanding again stating she wishes to continue taking things 1 day at a time allowing herself every opportunity to continue to thrive is much as possible while feeling good.  She is much appreciative of her current quality of life.  She understands if her disease progresses she may become more symptomatic and not be a candidate for further treatment.  She is clear in her expressed wishes that she will address that when the time comes.   PLAN: Ongoing goals of care discussions.  Patient is clear and expressed wishes to continue to treat the treatable however with a desire for  natural death.  Confirms wishes for DNR/DNI and no artificial feeding/PEG.  Would like to complete documents at follow-up visit allow her time to discuss with her family.   Oxycodone 5mg  as needed every 6 hours as needed  I will plan to see patient back in 1-2 weeks.  She knows to contact our office sooner if needed.   Patient expressed understanding and was in agreement with this plan. She also understands that She can call the clinic at any time with any questions, concerns, or complaints.   Any controlled substances utilized were prescribed in the context of palliative care. PDMP has been reviewed.    Visit consisted of counseling and education dealing with the complex and emotionally intense issues of symptom management and palliative care in the setting of serious and potentially life-threatening illness.Greater than 50%  of this time was spent counseling and coordinating care related to the above assessment and plan.  Willette Alma, AGPCNP-BC  Palliative Medicine Team/ Cancer Center  *Please note that this is a verbal dictation therefore any spelling or grammatical errors are due to the "[employer]" system interpretation.

## 2022-07-09 ENCOUNTER — Ambulatory Visit: Payer: 59

## 2022-07-09 ENCOUNTER — Emergency Department (HOSPITAL_COMMUNITY)
Admission: EM | Admit: 2022-07-09 | Discharge: 2022-07-09 | Disposition: A | Discharge status: Home / Self Care | Payer: 59 | Attending: Emergency Medicine | Admitting: Emergency Medicine

## 2022-07-09 ENCOUNTER — Emergency Department (HOSPITAL_COMMUNITY): Payer: 59

## 2022-07-09 ENCOUNTER — Other Ambulatory Visit: Payer: Self-pay

## 2022-07-09 ENCOUNTER — Encounter (HOSPITAL_COMMUNITY): Payer: Self-pay

## 2022-07-09 DIAGNOSIS — C3490 Malignant neoplasm of unspecified part of unspecified bronchus or lung: Secondary | ICD-10-CM

## 2022-07-09 DIAGNOSIS — G893 Neoplasm related pain (acute) (chronic): Secondary | ICD-10-CM | POA: Diagnosis not present

## 2022-07-09 DIAGNOSIS — E860 Dehydration: Secondary | ICD-10-CM | POA: Diagnosis not present

## 2022-07-09 DIAGNOSIS — J90 Pleural effusion, not elsewhere classified: Secondary | ICD-10-CM | POA: Diagnosis not present

## 2022-07-09 DIAGNOSIS — E46 Unspecified protein-calorie malnutrition: Secondary | ICD-10-CM

## 2022-07-09 DIAGNOSIS — E8809 Other disorders of plasma-protein metabolism, not elsewhere classified: Secondary | ICD-10-CM | POA: Insufficient documentation

## 2022-07-09 DIAGNOSIS — M7989 Other specified soft tissue disorders: Secondary | ICD-10-CM | POA: Diagnosis not present

## 2022-07-09 DIAGNOSIS — K5900 Constipation, unspecified: Secondary | ICD-10-CM | POA: Diagnosis present

## 2022-07-09 LAB — CBC WITH DIFFERENTIAL/PLATELET
Abs Immature Granulocytes: 0.04 10*3/uL (ref 0.00–0.07)
Basophils Absolute: 0.1 10*3/uL (ref 0.0–0.1)
Basophils Relative: 1 %
Eosinophils Absolute: 0.2 10*3/uL (ref 0.0–0.5)
Eosinophils Relative: 3 %
HCT: 33.5 % — ABNORMAL LOW (ref 36.0–46.0)
Hemoglobin: 10.9 g/dL — ABNORMAL LOW (ref 12.0–15.0)
Immature Granulocytes: 1 %
Lymphocytes Relative: 16 %
Lymphs Abs: 1 10*3/uL (ref 0.7–4.0)
MCH: 28.5 pg (ref 26.0–34.0)
MCHC: 32.5 g/dL (ref 30.0–36.0)
MCV: 87.7 fL (ref 80.0–100.0)
Monocytes Absolute: 0.6 10*3/uL (ref 0.1–1.0)
Monocytes Relative: 10 %
Neutro Abs: 4.4 10*3/uL (ref 1.7–7.7)
Neutrophils Relative %: 69 %
Platelets: 464 10*3/uL — ABNORMAL HIGH (ref 150–400)
RBC: 3.82 MIL/uL — ABNORMAL LOW (ref 3.87–5.11)
RDW: 16.5 % — ABNORMAL HIGH (ref 11.5–15.5)
WBC: 6.3 10*3/uL (ref 4.0–10.5)
nRBC: 0 % (ref 0.0–0.2)

## 2022-07-09 LAB — URINALYSIS, ROUTINE W REFLEX MICROSCOPIC
Bilirubin Urine: NEGATIVE
Glucose, UA: NEGATIVE mg/dL
Hgb urine dipstick: NEGATIVE
Ketones, ur: 20 mg/dL — AB
Leukocytes,Ua: NEGATIVE
Nitrite: NEGATIVE
Protein, ur: NEGATIVE mg/dL
Specific Gravity, Urine: 1.012 (ref 1.005–1.030)
pH: 6 (ref 5.0–8.0)

## 2022-07-09 LAB — COMPREHENSIVE METABOLIC PANEL
ALT: 8 U/L (ref 0–44)
AST: 12 U/L — ABNORMAL LOW (ref 15–41)
Albumin: 2.4 g/dL — ABNORMAL LOW (ref 3.5–5.0)
Alkaline Phosphatase: 65 U/L (ref 38–126)
Anion gap: 9 (ref 5–15)
BUN: 5 mg/dL — ABNORMAL LOW (ref 8–23)
CO2: 22 mmol/L (ref 22–32)
Calcium: 7.8 mg/dL — ABNORMAL LOW (ref 8.9–10.3)
Chloride: 105 mmol/L (ref 98–111)
Creatinine, Ser: 0.49 mg/dL (ref 0.44–1.00)
GFR, Estimated: >60 mL/min (ref >60)
Glucose, Bld: 90 mg/dL (ref 70–99)
Potassium: 3.6 mmol/L (ref 3.5–5.1)
Sodium: 136 mmol/L (ref 135–145)
Total Bilirubin: 1 mg/dL (ref 0.3–1.2)
Total Protein: 5.6 g/dL — ABNORMAL LOW (ref 6.5–8.1)

## 2022-07-09 LAB — CBG MONITORING, ED: Glucose-Capillary: 113 mg/dL — ABNORMAL HIGH (ref 70–99)

## 2022-07-09 LAB — MAGNESIUM: Magnesium: 1.8 mg/dL (ref 1.7–2.4)

## 2022-07-09 MED ORDER — ENSURE ENLIVE PO LIQD
237.0000 mL | Freq: Once | ORAL | Status: AC
  Administered 2022-07-09: 237 mL via ORAL
  Filled 2022-07-09: qty 237

## 2022-07-09 MED ORDER — OXYCODONE HCL 5 MG PO TABS: 5.0000 mg | ORAL_TABLET | Freq: Three times a day (TID) | ORAL | 0 refills | Status: DC | PRN

## 2022-07-09 MED ORDER — SODIUM CHLORIDE 0.9 % IV BOLUS
1000.0000 mL | Freq: Once | INTRAVENOUS | Status: AC
  Administered 2022-07-09: 1000 mL via INTRAVENOUS

## 2022-07-09 MED ORDER — ONDANSETRON HCL 4 MG/2ML IJ SOLN
4.0000 mg | Freq: Once | INTRAMUSCULAR | Status: AC
  Administered 2022-07-09: 4 mg via INTRAVENOUS
  Filled 2022-07-09: qty 2

## 2022-07-09 MED ORDER — MEGESTROL ACETATE 40 MG PO TABS: 40.0000 mg | ORAL_TABLET | Freq: Every day | ORAL | 0 refills | Status: DC

## 2022-07-09 MED ORDER — MORPHINE SULFATE (PF) 4 MG/ML IV SOLN
4.0000 mg | Freq: Once | INTRAVENOUS | Status: AC
  Administered 2022-07-09: 4 mg via INTRAVENOUS
  Filled 2022-07-09: qty 1

## 2022-07-09 NOTE — ED Triage Notes (Signed)
Pt c/o swelling in her hands and feet which has been going on for about 2 wks. Pt stated she had radiation on her brain 2 wks ago and afterwards is when she started swelling. No appetite for about a week. No bowel movement in about a week.

## 2022-07-09 NOTE — ED Provider Notes (Signed)
[place] EMERGENCY DEPARTMENT AT [facility] Provider Note   CSN: [id number] Arrival date & time: 07/09/22  4098     History  Chief Complaint  Patient presents with   Leg Swelling   swelling hands   Constipation    Sandra Horne is a 67 y.o. female.  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  Pt has been receiving radiation treatment.  She has refused systemic therapy and immunotherapy, but remains a full code despite multiple discussions with palliative care.  Pt was d/c on 4/5 after an admission for post-obstructive pna.  Last radiation tx to the chest was on 4/4.  Last radiation to the brain was in Feb.  Pt said she's had some swelling in her legs and in her hands.  She has had very little appetite.  She has been drinking some ensure.  She has been constipated.  No fevers.              Home Medications Prior to Admission medications   Medication Sig Start Date End Date Taking? Authorizing Provider  acetaminophen (TYLENOL) 325 MG tablet Take 2 tablets (650 mg total) by mouth every 6 (six) hours as needed for mild pain or moderate pain (or Fever >/= 101). 07/02/22  Yes Hongalgi, Maximino Greenland, MD  megestrol (MEGACE) 40 MG tablet Take 1 tablet (40 mg total) by mouth daily. 07/09/22  Yes Jacalyn Lefevre, MD  polyethylene glycol (MIRALAX / GLYCOLAX) 17 g packet Take 17 g by mouth daily. 05/12/22  Yes Burnadette Pop, MD  guaiFENesin-dextromethorphan (ROBITUSSIN DM) 100-10 MG/5ML syrup Take 5 mLs by mouth every 4 (four) hours as needed for cough. Patient not taking: Reported on 07/09/2022 07/02/22   Elease Etienne, MD  levETIRAcetam (KEPPRA) 500 MG tablet Take 1 tablet (500 mg total) by mouth 2 (two) times daily. Patient not taking: Reported on 07/09/2022 07/02/22   Elease Etienne, MD  nicotine (NICODERM CQ - DOSED IN MG/24 HOURS) 14 mg/24hr patch Place 1 patch (14 mg total) onto the skin daily as  needed (nicotine craving). Patient not taking: Reported on 07/09/2022 07/02/22   Elease Etienne, MD  oxyCODONE (OXY IR/ROXICODONE) 5 MG immediate release tablet Take 1 tablet (5 mg total) by mouth every 8 (eight) hours as needed for severe pain. 07/09/22   Jacalyn Lefevre, MD      Allergies    Patient has no known allergies.    Review of Systems   Review of Systems  Constitutional:  Positive for appetite change.  Musculoskeletal:        Swelling in legs  Neurological:  Positive for weakness.  All other systems reviewed and are negative.   Physical Exam Updated Vital Signs BP 115/73 (BP Location: Right Arm)   Pulse 95   Temp 98.5 F (36.9 C) (Oral)   Resp 18   SpO2 100%  Physical Exam Vitals and nursing note reviewed.  Constitutional:      Appearance: Normal appearance.  HENT:     Head: Normocephalic and atraumatic.     Right Ear: External ear normal.     Left Ear: External ear normal.     Nose: Nose normal.     Mouth/Throat:     Mouth: Mucous membranes are dry.  Eyes:     Extraocular Movements: Extraocular movements intact.     Conjunctiva/sclera: Conjunctivae normal.     Pupils: Pupils are equal, round, and  reactive to light.  Cardiovascular:     Rate and Rhythm: Normal rate and regular rhythm.     Pulses: Normal pulses.     Heart sounds: Normal heart sounds.  Pulmonary:     Effort: Pulmonary effort is normal.     Breath sounds: Normal breath sounds.  Abdominal:     General: Abdomen is flat. Bowel sounds are normal.     Palpations: Abdomen is soft.  Musculoskeletal:     Cervical back: Normal range of motion and neck supple.     Comments: Right hand mildly swollen; BLE swelling R> L  Skin:    General: Skin is warm.     Capillary Refill: Capillary refill takes less than 2 seconds.  Neurological:     General: No focal deficit present.     Mental Status: She is alert and oriented to person, place, and time.  Psychiatric:        Mood and Affect: Mood normal.         Behavior: Behavior normal.     ED Results / Procedures / Treatments   Labs (all labs ordered are listed, but only abnormal results are displayed) Labs Reviewed  CBC WITH DIFFERENTIAL/PLATELET - Abnormal; Notable for the following components:      Result Value   RBC 3.82 (*)    Hemoglobin 10.9 (*)    HCT 33.5 (*)    RDW 16.5 (*)    Platelets 464 (*)    All other components within normal limits  URINALYSIS, ROUTINE W REFLEX MICROSCOPIC - Abnormal; Notable for the following components:   Ketones, ur 20 (*)    All other components within normal limits  COMPREHENSIVE METABOLIC PANEL - Abnormal; Notable for the following components:   BUN 5 (*)    Calcium 7.8 (*)    Total Protein 5.6 (*)    Albumin 2.4 (*)    AST 12 (*)    All other components within normal limits  CBG MONITORING, ED - Abnormal; Notable for the following components:   Glucose-Capillary 113 (*)    All other components within normal limits  MAGNESIUM    EKG EKG Interpretation  Date/Time:  Friday July 09 2022 10:51:32 EDT Ventricular Rate:  91 PR Interval:  193 QRS Duration: 96 QT Interval:  350 QTC Calculation: 431 R Axis:   71 Text Interpretation: Sinus rhythm No significant change since last tracing Confirmed by Jacalyn Lefevre [phone]) on 07/09/2022 1:38:49 PM  Radiology US Venous Img Lower Bilateral (DVT)  Result Date: 07/09/2022 CLINICAL DATA:  Swelling of the lower limb. EXAM: BILATERAL LOWER EXTREMITY VENOUS DOPPLER ULTRASOUND TECHNIQUE: Gray-scale sonography with graded compression, as well as color Doppler and duplex ultrasound were performed to evaluate the lower extremity deep venous systems from the level of the common femoral vein and including the common femoral, femoral, profunda femoral, popliteal and calf veins including the posterior tibial, peroneal and gastrocnemius veins when visible. The superficial great saphenous vein was also interrogated. Spectral Doppler was utilized to evaluate  flow at rest and with distal augmentation maneuvers in the common femoral, femoral and popliteal veins. COMPARISON:  None Available. FINDINGS: RIGHT LOWER EXTREMITY Common Femoral Vein: No evidence of thrombus. Normal compressibility, respiratory phasicity and response to augmentation. Saphenofemoral Junction: No evidence of thrombus. Normal compressibility and flow on color Doppler imaging. Profunda Femoral Vein: No evidence of thrombus. Normal compressibility and flow on color Doppler imaging. Femoral Vein: No evidence of thrombus. Normal compressibility, respiratory phasicity and response to augmentation. Popliteal  Vein: No evidence of thrombus. Normal compressibility, respiratory phasicity and response to augmentation. Calf Veins: No evidence of thrombus. Normal compressibility and flow on color Doppler imaging. Other Findings: Calf edema. LEFT LOWER EXTREMITY Common Femoral Vein: No evidence of thrombus. Normal compressibility, respiratory phasicity and response to augmentation. Saphenofemoral Junction: No evidence of thrombus. Normal compressibility and flow on color Doppler imaging. Profunda Femoral Vein: No evidence of thrombus. Normal compressibility and flow on color Doppler imaging. Femoral Vein: No evidence of thrombus. Normal compressibility, respiratory phasicity and response to augmentation. Popliteal Vein: No evidence of thrombus. Normal compressibility, respiratory phasicity and response to augmentation. Calf Veins: No evidence of thrombus. Normal compressibility and flow on color Doppler imaging. Other Findings: Calf edema. IMPRESSION: No evidence of deep venous thrombosis in either lower extremity. Electronically Signed   By: Feliberto Harts M.D.   On: 07/09/2022 11:54   CT Head Wo Contrast  Result Date: 07/09/2022 CLINICAL DATA:  History of brain malignancy. Mental status change. Brain radiation therapy 2 weeks ago EXAM: CT HEAD WITHOUT CONTRAST TECHNIQUE: Contiguous axial images were  obtained from the base of the skull through the vertex without intravenous contrast. RADIATION DOSE REDUCTION: This exam was performed according to the departmental dose-optimization program which includes automated exposure control, adjustment of the mA and/or kV according to patient size and/or use of iterative reconstruction technique. COMPARISON:  06/28/2022 and 05/24/2022. FINDINGS: Brain: Well-defined fluid attenuation lesion in the posterior left frontal lobe currently measures 1.8 cm superior to inferior by 1.5 x 1.3 cm transversely, previously 2.0 x 1.8 x 1.4 cm. Smaller fluid attenuation lesion inferior left occipital lobe, 7 mm, unchanged. Diffuse left cerebral vasogenic edema and associated mass effect. Extent of edema is without change from the prior study. Mass effect/midline shift is mildly improved, by 1-2 mm. No new intracranial lesions. No evidence of an acute infarct, no hydrocephalus, no extra-axial masses or abnormal fluid collections and no intracranial hemorrhage. Vascular: No hyperdense vessel or unexpected calcification. Skull: Normal. Negative for fracture or focal lesion. Sinuses/Orbits: Normal globes and orbits. Visualized sinuses are clear. Other: None. IMPRESSION: 1. No acute abnormalities. 2. Mild decrease in the size of the posterior left frontal cystic lesion. Extensive left cerebral vasogenic edema is similar. Mass effect/midline C diff has slightly improved from the most recent prior exam. Electronically Signed   By: Amie Portland M.D.   On: 07/09/2022 10:57   DG Chest Portable 1 View  Result Date: 07/09/2022 CLINICAL DATA:  Swelling in hands and feet 2 weeks. Symptoms began after radiation to the head. EXAM: PORTABLE CHEST 1 VIEW COMPARISON:  One-view chest x-ray 4/1/4 FINDINGS: The heart size is normal. Minimal interstitial prominence is similar the prior exam. Asymmetric opacity at the left base is no longer present. No focal airspace disease is present. Effusions are  present the visualized soft tissues bony are. IMPRESSION: 1. No acute cardiopulmonary disease. 2. Minimal interstitial prominence is similar to the prior exam. Electronically Signed   By: Marin Roberts M.D.   On: 07/09/2022 10:36    Procedures Procedures    Medications Ordered in ED Medications  feeding supplement (ENSURE ENLIVE / ENSURE PLUS) liquid 237 mL (has no administration in time range)  sodium chloride 0.9 % bolus 1,000 mL (1,000 mLs Intravenous New Bag/Given 07/09/22 1048)  morphine (PF) 4 MG/ML injection 4 mg (4 mg Intravenous Given 07/09/22 1114)  ondansetron (ZOFRAN) injection 4 mg (4 mg Intravenous Given 07/09/22 1114)    ED Course/ Medical Decision Making/ A&P Clinical  Course as of 07/09/22 1342  Fri Jul 09, 2022  1338 DG Chest Portable 1 View [JH]    Clinical Course User Index [JH] Jacalyn Lefevre, MD                             Medical Decision Making Amount and/or Complexity of Data Reviewed Labs: ordered. Radiology: ordered.  Risk OTC drugs. Prescription drug management.  This patient presents to the ED for concern of weakness, this involves an extensive number of treatment options, and is a complaint that carries with it a high risk of complications and morbidity.  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  The pertinent results include:  ua with ketones, cmp with albumin low at 2.4, mg low at 1.8, cbc with chronic anemia, hgb 10.9   Imaging Studies ordered:  I ordered imaging studies including cxr, [place], CT head I independently visualized and interpreted imaging which  showed  CXR: No acute cardiopulmonary disease.  2. Minimal interstitial prominence is similar to the prior exam.  CT head: No acute abnormalities.  2. Mild decrease in the size of the posterior left frontal cystic  lesion. Extensive left cerebral vasogenic edema is similar. Mass  effect/midline C diff has slightly improved from the most recent  prior exam.  [place]: IMPRESSION:  No evidence of deep venous thrombosis in either lower extremity.   I agree with the radiologist interpretation   Cardiac Monitoring:  The patient was maintained on a cardiac monitor.  I personally viewed and interpreted the cardiac monitored which showed an underlying rhythm of: nsr   Medicines ordered and prescription drug management:  I ordered medication including ivfs, morphine/zofran  for sx  Reevaluation of the patient after these medicines showed that the patient improved I have reviewed the patients home medicines and have made adjustments as needed   Test Considered:  ct   Critical Interventions:  Pain control   Problem List / ED Course:  Swelling:  possibly due to protein deficiency.  Albumin is low.  Pt requests something to help with her appetite, so she is given a rx for megace.  Unfortunately, pt missed her appointment for f/u from her hospital admission as she was here.  She is encouraged to call and reschedule. She also requested a pain med refill.  She is given a rx for #10 oxy 5. She is to return if worse.    Reevaluation:  After the interventions noted above, I reevaluated the patient and found that they have :improved   Social Determinants of Health:  Lives at home with husband   Dispostion:  After consideration of the diagnostic results and the patients response to treatment, I feel that the patent would benefit from discharge with outpatient f/u.          Final Clinical Impression(s) / ED Diagnoses Final diagnoses:  Dehydration  Hypoalbuminemia due to  protein-calorie malnutrition  Metastatic non-small cell lung cancer  Cancer associated pain    Rx / DC Orders ED Discharge Orders          Ordered    megestrol (MEGACE) 40 MG tablet  Daily        07/09/22 1336    oxyCODONE (OXY IR/ROXICODONE) 5 MG immediate release tablet  Every 8 hours PRN        07/09/22 1337              Jacalyn Lefevre, MD 07/09/22 1342

## 2022-07-09 NOTE — ED Notes (Signed)
Pt given vanilla ensure upon request

## 2022-07-12 ENCOUNTER — Ambulatory Visit: Payer: 59

## 2022-07-13 ENCOUNTER — Inpatient Hospital Stay: Payer: 59

## 2022-07-13 ENCOUNTER — Ambulatory Visit: Payer: 59

## 2022-07-13 ENCOUNTER — Telehealth: Payer: Self-pay

## 2022-07-13 NOTE — Telephone Encounter (Signed)
Rn called pt daughter when all other attempts failed. Pt daughter stated that the pt had not come to radiation treatments due to severe bilateral leg swelling and pain. The pt then got on the phone and stated she had been to the emergency department for this concern two to three days ago. She reports they did a work up on the concern (which included labs and images). However she still states the pain has been severe and the swelling remains. She also reports slight shortness of breath at times as well. Rn encouraged pt to go back to the Emergency department to be evaluated again. She stated she would. Rn Victorino Dike called Rn charge Sheria Lang in ED to let her know the pt would be coming. Rn will also notify [facility] that pt will not be coming for treatment today.

## 2022-07-13 NOTE — Telephone Encounter (Signed)
RN attempted to call pt concerning pt missing her last three radiation appointments. Pt did not have a voice mail set up. Rn did leave voice mail for patients husband to call us back. Rn will attempt to call pt back later as well.

## 2022-07-14 ENCOUNTER — Telehealth: Payer: Self-pay

## 2022-07-14 ENCOUNTER — Ambulatory Visit: Payer: 59

## 2022-07-14 ENCOUNTER — Inpatient Hospital Stay (HOSPITAL_BASED_OUTPATIENT_CLINIC_OR_DEPARTMENT_OTHER): Payer: 59 | Admitting: Nurse Practitioner

## 2022-07-14 ENCOUNTER — Other Ambulatory Visit: Payer: Self-pay

## 2022-07-14 VITALS — BP 104/59 | HR 104 | Temp 99.5°F | Resp 16 | Wt 122.2 lb

## 2022-07-14 DIAGNOSIS — R634 Abnormal weight loss: Secondary | ICD-10-CM | POA: Diagnosis not present

## 2022-07-14 DIAGNOSIS — G893 Neoplasm related pain (acute) (chronic): Secondary | ICD-10-CM

## 2022-07-14 DIAGNOSIS — Z7189 Other specified counseling: Secondary | ICD-10-CM | POA: Diagnosis not present

## 2022-07-14 DIAGNOSIS — C3490 Malignant neoplasm of unspecified part of unspecified bronchus or lung: Secondary | ICD-10-CM | POA: Diagnosis not present

## 2022-07-14 DIAGNOSIS — R630 Anorexia: Secondary | ICD-10-CM | POA: Diagnosis not present

## 2022-07-14 DIAGNOSIS — Z515 Encounter for palliative care: Secondary | ICD-10-CM

## 2022-07-14 DIAGNOSIS — R530 Neoplastic (malignant) related fatigue: Secondary | ICD-10-CM | POA: Diagnosis not present

## 2022-07-14 DIAGNOSIS — C7931 Secondary malignant neoplasm of brain: Secondary | ICD-10-CM

## 2022-07-14 MED ORDER — OXYCODONE HCL 5 MG PO TABS
5.0000 mg | ORAL_TABLET | Freq: Three times a day (TID) | ORAL | 0 refills | Status: DC | PRN
Start: 2022-07-14 — End: 2022-08-04

## 2022-07-14 NOTE — Telephone Encounter (Signed)
Rn called pt for update on her condition after phone call yesterday (concerning bilateral foot/ leg swelling and pain). Rn had encouraged pt to go to the Ed to be checked out, but noticed the pt had not went. On phone call today she stated she felt like she was walking better overall today with less bilateral swelling. She stated she felt like she urinated for "30 minutes last night". She reports that on there last ED visit they did start on her medication to get the fluid off. RN told her that the pill is definitely working for her then. When asked about radiation today she stated she did not want to do radiation right now until she finds out more about what is going on in her body with the swelling and pain. Rn did educate pt on the importance of radiation overall. Rn also encouraged her to keep her appointment with palliative care today as she is concerned for pain in her legs and overall as well. She stated she normally gets rides from [place]. Rn will check and make sure she has a ride set up for today. One other concern noted with this phone call was her left eye. She stated she feels like something is hanging down from her eye ("like a hair") but she cannot feel anything in the area. Rn encouraged pt to reach out to her primary care provider on this concern and the continued concern for leg swelling/pain as well. She stated she would reach out to them.

## 2022-07-15 ENCOUNTER — Ambulatory Visit: Payer: 59

## 2022-07-15 ENCOUNTER — Telehealth: Payer: Self-pay

## 2022-07-15 ENCOUNTER — Inpatient Hospital Stay: Payer: 59

## 2022-07-15 NOTE — Telephone Encounter (Signed)
RN attempted to call pt again this afternoon after missing her radiation treatment. Rn could not get pt on the phone on all three numbers listed. Pt had no voicemail set up and no answer at daughters number. Rn attempt to call pt tomorrow to check on her plan of care for radiation treatment.

## 2022-07-15 NOTE — Telephone Encounter (Signed)
RN attempted to call pt concerning her desire to continue treatment based on notes for her appointment with [facility] yesterday. Rn left a voice mail on patients husband's voicemail. Pt voicemail was not set up and there was no answer on her daughters number. Rn will attempt to call back this afternoon to talk with pt about her plan for care.

## 2022-07-16 ENCOUNTER — Ambulatory Visit: Payer: 59

## 2022-07-16 ENCOUNTER — Telehealth: Payer: Self-pay

## 2022-07-16 NOTE — Telephone Encounter (Signed)
Rn Victorino Dike called pt to see if she is coming for treatment today. She stated her legs are still swollen and has "too much going on " with her body at this time. She wants to remain on the schedule daily and make day by day decisions about radiation. She does hope to come back next week for at least three days she stated. Rn highly encouraged her to be consistent as possible about coming to treatments (to get full effectiveness overall). She understood this however still did not feel like she could come today for treatment. Pt did report she is to see her PCP this am. She plans on discussing her leg swelling as well as new issue with her hand going numb. Rn will continue to call and check on pt daily to see if she is coming for radiation treatments.

## 2022-07-19 ENCOUNTER — Ambulatory Visit: Payer: 59

## 2022-07-20 ENCOUNTER — Telehealth: Payer: Self-pay

## 2022-07-20 ENCOUNTER — Ambulatory Visit: Payer: 59

## 2022-07-20 NOTE — Telephone Encounter (Signed)
RN attempted to call pt since she missed her radiation therapy session on 07-19-22. RN was unable to leave a message on any of the three numbers that were provided due to voicemail not being set up properly. Rn will attempt to call back later this afternoon.

## 2022-07-20 NOTE — Telephone Encounter (Signed)
Rn attempted to call pt back this afternoon without success. All three numbers attempted did not have voicemail set up. Rn will attempt to call again tomorrow to check on pt.

## 2022-07-21 ENCOUNTER — Telehealth: Payer: Self-pay

## 2022-07-21 ENCOUNTER — Ambulatory Visit: Payer: 59

## 2022-07-21 NOTE — Telephone Encounter (Signed)
Rn finally was able to talk with pt and get update on her condition. Pt was reminded on this phone call she has now missed about two weeks of radiation treatments. She stated she was confused as to why she went from two treatments to ten treatments. She stated at first she was only told she would need two treatments but this increased to 10 (after she developed pneumonia). Pt stated she still wanted to continue treatment but her swelling continues to be an issue. She also told RN that she has not need able to get to her Primary md for various personal reasons for this swelling concern. She did state she is breathing well and has no shortness of breath at this time. Rn encouraged pt that if she cannot get in with her PCP soon then she needs to get checked out at the Emergency Department (especially if she gets short of breath).  Pt hopes to return to treatment soon but continues to take it day by day. RN will continue to monitor pt.

## 2022-07-22 ENCOUNTER — Ambulatory Visit: Payer: 59

## 2022-07-23 ENCOUNTER — Ambulatory Visit: Payer: 59

## 2022-07-26 ENCOUNTER — Ambulatory Visit: Payer: 59

## 2022-07-26 ENCOUNTER — Telehealth: Payer: Self-pay

## 2022-07-26 NOTE — Telephone Encounter (Signed)
RN called to check on pt since she did not come for radiation therapy all last week. She has now missed about three weeks of treatment. Her daughter answered the phone and stated her mother was still asleep. She stated she has not been sleeping well but did not elaborate on details. Pt daughter will call RN back when her mom wakes up. Rn will continue to monitor pt situation with radiation treatment.

## 2022-07-27 ENCOUNTER — Ambulatory Visit: Payer: 59

## 2022-07-27 ENCOUNTER — Telehealth: Payer: Self-pay

## 2022-07-27 NOTE — Telephone Encounter (Signed)
Rn called pt to check on her since she has not shown up for treatments in several weeks. She states she continues to have the bilateral leg/foot swelling that is limiting her to come to treatment. She denies shortness  of breath with this swelling. On past calls RN has encouraged pt to seek medical attention from her PCP or go to the ED. She keeps telling RN she will but never goes. She states he daughter cannot take her to her PCP due to her work schedule. Rn has also encouraged calling 911 if needed to seek medical attention. Rn once again encouraged pt to come back to radiation and educates her every time that consistency is key to success. When asked if she plans to come this week she said "I am going to try to make one appointment". She wants to remain on the schedule daily even when offered to take her off for now (to return when she feels like she can). Rn will seek feedback from Dr. Kathrynn Running and Lebron Quam on this pt. L3 updated on this pt as well. Rn will continue to monitor pt radiation therapy accountability.

## 2022-07-28 ENCOUNTER — Ambulatory Visit: Payer: 59

## 2022-07-28 ENCOUNTER — Telehealth: Payer: Self-pay

## 2022-07-28 NOTE — Telephone Encounter (Signed)
RN called pt and was able to speak to her daughter [place]. Rn explained to [place] that her moms treatments have been cancelled for now (due to the pt missing treatments for several weeks). Rn further stated that when pt wanted to start treatment back all she had to do was call. Pt daughter understood and was ok with this plan. She understood we wish her mom the best and will be thinking about her.

## 2022-07-28 NOTE — Telephone Encounter (Signed)
RN attempted to call pt concerning the plan for cancelling her current radiation treatments (due to to her not being able to come), without success. No line available had voice mail capability. RN will attempt to call back later this afternoon to talk to pt.

## 2022-07-29 ENCOUNTER — Ambulatory Visit: Payer: 59

## 2022-07-30 ENCOUNTER — Ambulatory Visit: Payer: 59

## 2022-08-02 ENCOUNTER — Ambulatory Visit: Payer: 59

## 2022-08-03 NOTE — Progress Notes (Unsigned)
Palliative Medicine [facility]  Telephone:(336) [phone] [phone]) [phone]   Name: Sandra Horne Date: 08/03/2022 MRN: [id number]  DOB: [birth year]  Patient Care Team: Pcp, No as PCP - General Pickenpack-Cousar, Arty Baumgartner, NP as Nurse Practitioner (Nurse Practitioner)   I connected with Sandra Horne on 08/03/22 at  1:00 PM EDT by phone and verified that I am speaking with the correct person using two identifiers.   I discussed the limitations, risks, security and privacy concerns of performing an evaluation and management service by telemedicine and the availability of in-person appointments. I also discussed with the patient that there may be a patient responsible charge related to this service. The patient expressed understanding and agreed to proceed.   Other persons participating in the visit and their role in the encounter: n/a   Patient's location: home  Provider's location: [facility]   Chief Complaint: f/u of symptom management    INTERVAL HISTORY: Sandra Horne is a 67 y.o. female with oncologic medical history including primary lung CA (left infrahilar mass dx Sept 2021 but lost to f/u, last radiation treatment 10/29/2021), chronic anemia, right femoral neck fracture (s/p right hemiarthroplasty on 10/13/2021) and tobacco abuse. She was recently admitted on 10/31/2021 with new right-sided weakness. CT of head revealed left parietal lobe mass with vasogenic edema. She is currently undergoing SRS.  Palliative ask to see for symptom management and goals of care.   SOCIAL HISTORY:     reports that she has been smoking cigarettes. She has never used smokeless tobacco. She reports that she does not currently use alcohol. She reports that she does not currently use drugs after having used the following drugs: Marijuana.  ADVANCE DIRECTIVES:    CODE STATUS:   PAST MEDICAL HISTORY: Past Medical History:  Diagnosis Date   Cancer (HCC)    Lung and Brain mets    Lung cancer (HCC)     ALLERGIES:  has No Known Allergies.  MEDICATIONS:  Current Outpatient Medications  Medication Sig Dispense Refill   acetaminophen (TYLENOL) 325 MG tablet Take 2 tablets (650 mg total) by mouth every 6 (six) hours as needed for mild pain or moderate pain (or Fever >/= 101).     guaiFENesin-dextromethorphan (ROBITUSSIN DM) 100-10 MG/5ML syrup Take 5 mLs by mouth every 4 (four) hours as needed for cough. (Patient not taking: Reported on 07/09/2022) 118 mL 0   levETIRAcetam (KEPPRA) 500 MG tablet Take 1 tablet (500 mg total) by mouth 2 (two) times daily. (Patient not taking: Reported on 07/09/2022) 60 tablet 1   megestrol (MEGACE) 40 MG tablet Take 1 tablet (40 mg total) by mouth daily. 30 tablet 0   nicotine (NICODERM CQ - DOSED IN MG/24 HOURS) 14 mg/24hr patch Place 1 patch (14 mg total) onto the skin daily as needed (nicotine craving). (Patient not taking: Reported on 07/09/2022) 28 patch 0   oxyCODONE (OXY IR/ROXICODONE) 5 MG immediate release tablet Take 1 tablet (5 mg total) by mouth every 8 (eight) hours as needed for severe pain. 45 tablet 0   polyethylene glycol (MIRALAX / GLYCOLAX) 17 g packet Take 17 g by mouth daily. 14 each 0   No current facility-administered medications for this visit.    VITAL SIGNS: There were no vitals taken for this visit. There were no vitals filed for this visit.  Estimated body mass index is 19.72 kg/m as calculated from the following:   Height as of 06/30/22: 5\' 6"  (1.676 m).  Weight as of 07/14/22: 122 lb 3.2 oz (55.4 kg).   PERFORMANCE STATUS (ECOG) : 1 - Symptomatic but completely ambulatory  Assessment In wheelchair, weak appearing RRR Bilateral lower extremity pitting edema R>L BS +x4, nontender, nondistended AAO x4  IMPRESSION:   Pain Denies nausea, vomiting, constipation, or diarrhea.  Patient endorses ongoing pain which she describes as constant.  She becomes tearful expressing her desire to gain better relief.   Complains of pain in her hip, lower back, and lower extremity.  Reports the only thing that has helped her is pain medication which she is out of.  Has utilized heating pack with minimal relief in addition to alternating between Tylenol and ibuprofen.  Patient was previously discharged home with oxycodone 5 mg as needed.  She describes her pain as 10 out of 10.  Pain decreases to 4-5 out of 10 after medication.  Will refill oxycodone 5 mg as needed.  We discussed possible use of long-acting pain medication to offer additional pain relief however patient declines at this time and would like to reevaluate in 1-2 weeks.  Education provided on use of stool softeners in the setting of opioid use.  2.  Goals of care   07/14/22- I created space and opportunity for Sandra Horne to discuss her thoughts and feelings on her current illness and disease.  She acknowledges reasoning for radiation therapy.  Confirms previous wishes for no systemic therapies.  She understands her cancer will continue to progress and eventually lead to end-of-life.  States she is focusing on the positive and wanting to "get better".  I had an open and direct conversation with her providing education on her disease trajectory emphasizing that the medical team and aggressively manage her symptoms and continue to offer palliative radiation by her stage IV lung cancer is not curative.  Patient verbalized understanding stating she has many different health problems which she is well aware of and is trying to prioritize what to "handle" and not.  Shares that she has been established with a PCP and she previously did not have one.  Is referring to her bilateral lower extremity edema stating she just needs the swelling to go down.  We discussed what is most important to patient.  She states to manage her pain while taking things one day at a time.  Continues to request full code/full scope care.  We discussed what a full code scenario would look  like for her. I emphasize poor quality of life and minimal chance of survival given significant disease. Sandra Horne verbalized understanding expressing appreciation and that she would not want to heroic measures and would desire a natural death when that time comes. Education provided on DNR/DNI.  She again verbalizes understanding confirming wishes for DNR.  I approach discussions around MOST form.  Extensive education regarding artificial feedings and other life-prolonging measures.  Patient is clear that if she is nearing end-of-life she would not want her life prolonged by artificial feedings and hydration knowing she would not enjoy this as she enjoys eating by mouth.  Confirms she would not want PEG tube at any point.  I encouraged her to continue discussions with her family and the importance of limitations in the situation of further health decline.  She would like to complete documentations during follow-up allow her time to have discussions with her family.  Goals of Care (02/01/2022) I created space and opportunity for patient to express thoughts and feelings in regards to her current illness.  Sandra Horne  states she is ready to return home as she is currently living with her daughter.  Feels that she is doing much better and does not wish to pursue treatments and take the risk of feeling poorly again.  Education provided on risk of disease progression in the setting of no interventions.  She verbalized understanding again stating she wishes to continue taking things 1 day at a time allowing herself every opportunity to continue to thrive is much as possible while feeling good.  She is much appreciative of her current quality of life.  She understands if her disease progresses she may become more symptomatic and not be a candidate for further treatment.  She is clear in her expressed wishes that she will address that when the time comes.   PLAN: Ongoing goals of care discussions.  Patient is clear and  expressed wishes to continue to treat the treatable however with a desire for natural death.  Confirms wishes for DNR/DNI and no artificial feeding/PEG.  Would like to complete documents at follow-up visit allow her time to discuss with her family.   Oxycodone 5mg  as needed every 6 hours as needed  I will plan to see patient back in 1-2 weeks.  She knows to contact our office sooner if needed.   Patient expressed understanding and was in agreement with this plan. She also understands that She can call the clinic at any time with any questions, concerns, or complaints.   Any controlled substances utilized were prescribed in the context of palliative care. PDMP has been reviewed.    Visit consisted of counseling and education dealing with the complex and emotionally intense issues of symptom management and palliative care in the setting of serious and potentially life-threatening illness.Greater than 50%  of this time was spent counseling and coordinating care related to the above assessment and plan.  Willette Alma, AGPCNP-BC  Palliative Medicine Team/[facility]  *Please note that this is a verbal dictation therefore any spelling or grammatical errors are due to the "Dragon Medical One" system interpretation.

## 2022-08-04 ENCOUNTER — Encounter: Payer: Self-pay | Admitting: Nurse Practitioner

## 2022-08-04 ENCOUNTER — Inpatient Hospital Stay: Payer: 59 | Attending: Physician Assistant | Admitting: Nurse Practitioner

## 2022-08-04 DIAGNOSIS — R530 Neoplastic (malignant) related fatigue: Secondary | ICD-10-CM

## 2022-08-04 DIAGNOSIS — C7931 Secondary malignant neoplasm of brain: Secondary | ICD-10-CM

## 2022-08-04 DIAGNOSIS — C3490 Malignant neoplasm of unspecified part of unspecified bronchus or lung: Secondary | ICD-10-CM

## 2022-08-04 DIAGNOSIS — Z515 Encounter for palliative care: Secondary | ICD-10-CM | POA: Diagnosis not present

## 2022-08-04 DIAGNOSIS — K5903 Drug induced constipation: Secondary | ICD-10-CM

## 2022-08-04 DIAGNOSIS — G893 Neoplasm related pain (acute) (chronic): Secondary | ICD-10-CM

## 2022-08-04 MED ORDER — XTAMPZA ER 9 MG PO C12A
9.0000 mg | EXTENDED_RELEASE_CAPSULE | Freq: Two times a day (BID) | ORAL | 0 refills | Status: DC
Start: 2022-08-04 — End: 2022-09-14

## 2022-08-04 MED ORDER — OXYCODONE HCL 10 MG PO TABS
5.0000 mg | ORAL_TABLET | Freq: Four times a day (QID) | ORAL | 0 refills | Status: DC | PRN
Start: 2022-08-04 — End: 2022-09-14

## 2022-08-06 NOTE — Radiation Completion Notes (Signed)
Patient Name: Sandra Horne, Sandra Horne MRN: [id number] Date of Birth: Aug 06, 1955 Referring Physician: Doreatha Massed, M.D. Date of Service: 2022-08-06 Radiation Oncologist: Margaretmary Bayley, M.D. [facility] [place]                             RADIATION ONCOLOGY END OF TREATMENT NOTE     Diagnosis: C79.31 Secondary malignant neoplasm of brain Staging on 2021-06-02: Primary cancer of left lower lobe of lung (HCC) T=cT3, N=cN2, M=cM1b Intent: Palliative     ==========DELIVERED PLANS==========  First Treatment Date: 2022-07-01 - Last Treatment Date: 2022-07-05   Plan Name: Lung_L Site: Lung, Left Technique: 3D Mode: Photon Dose Per Fraction: 3 Gy Prescribed Dose (Delivered / Prescribed): 3 Gy / 3 Gy Prescribed Fxs (Delivered / Prescribed): 1 / 1   Plan Name: Lung_L:1 Site: Lung, Left Technique: 3D Mode: Photon Dose Per Fraction: 3 Gy Prescribed Dose (Delivered / Prescribed): 6 Gy / 27 Gy Prescribed Fxs (Delivered / Prescribed): 3 / 9     ==========ON TREATMENT VISIT DATES========== 2022-07-02     ==========UPCOMING VISITS==========       ==========APPENDIX - ON TREATMENT VISIT NOTES==========   See weekly On Treatment Notes is Epic for details.

## 2022-08-11 DIAGNOSIS — C7931 Secondary malignant neoplasm of brain: Secondary | ICD-10-CM | POA: Diagnosis not present

## 2022-08-11 DIAGNOSIS — R531 Weakness: Secondary | ICD-10-CM | POA: Diagnosis not present

## 2022-08-11 DIAGNOSIS — E440 Moderate protein-calorie malnutrition: Secondary | ICD-10-CM | POA: Diagnosis not present

## 2022-08-17 NOTE — Progress Notes (Deleted)
Palliative Medicine [facility]  Telephone:(336) [phone] Fax:(336) [phone]   Name: Sandra Horne Date: 08/17/2022 MRN: [id number]  DOB: Mar 09, 1956  Patient Care Team: Pcp, No as PCP - General Pickenpack-Cousar, Arty Baumgartner, NP as Nurse Practitioner (Nurse Practitioner)   I connected with Sandra Horne on 08/17/22 at  1:30 PM EDT by phone and verified that I am speaking with the correct person using two identifiers.   I discussed the limitations, risks, security and privacy concerns of performing an evaluation and management service by telemedicine and the availability of in-person appointments. I also discussed with the patient that there may be a patient responsible charge related to this service. The patient expressed understanding and agreed to proceed.   Other persons participating in the visit and their role in the encounter: [place], daughter   Patient's location: home  Provider's location: [facility]   Chief Complaint: f/u of symptom management    INTERVAL HISTORY: Sandra Horne is a 68 y.o. female with oncologic medical history including primary lung CA (left infrahilar mass dx Sept 2021 but lost to f/u, last radiation treatment 10/29/2021), chronic anemia, right femoral neck fracture (s/p right hemiarthroplasty on 10/13/2021) and tobacco abuse. She was recently admitted on 10/31/2021 with new right-sided weakness. CT of head revealed left parietal lobe mass with vasogenic edema. She is currently undergoing SRS.  Palliative ask to see for symptom management and goals of care.   SOCIAL HISTORY:     reports that she has been smoking cigarettes. She has never used smokeless tobacco. She reports that she does not currently use alcohol. She reports that she does not currently use drugs after having used the following drugs: Marijuana.  ADVANCE DIRECTIVES:    CODE STATUS:   PAST MEDICAL HISTORY: Past Medical History:  Diagnosis Date   Cancer (HCC)    Lung  and Brain mets   Lung cancer (HCC)     ALLERGIES:  has No Known Allergies.  MEDICATIONS:  Current Outpatient Medications  Medication Sig Dispense Refill   acetaminophen (TYLENOL) 325 MG tablet Take 2 tablets (650 mg total) by mouth every 6 (six) hours as needed for mild pain or moderate pain (or Fever >/= 101).     guaiFENesin-dextromethorphan (ROBITUSSIN DM) 100-10 MG/5ML syrup Take 5 mLs by mouth every 4 (four) hours as needed for cough. (Patient not taking: Reported on 07/09/2022) 118 mL 0   levETIRAcetam (KEPPRA) 500 MG tablet Take 1 tablet (500 mg total) by mouth 2 (two) times daily. (Patient not taking: Reported on 07/09/2022) 60 tablet 1   megestrol (MEGACE) 40 MG tablet Take 1 tablet (40 mg total) by mouth daily. 30 tablet 0   nicotine (NICODERM CQ - DOSED IN MG/24 HOURS) 14 mg/24hr patch Place 1 patch (14 mg total) onto the skin daily as needed (nicotine craving). (Patient not taking: Reported on 07/09/2022) 28 patch 0   oxyCODONE 10 MG TABS Take 0.5 tablets (5 mg total) by mouth every 6 (six) hours as needed for severe pain. 60 tablet 0   oxyCODONE ER (XTAMPZA ER) 9 MG C12A Take 9 mg by mouth every 12 (twelve) hours. 30 capsule 0   polyethylene glycol (MIRALAX / GLYCOLAX) 17 g packet Take 17 g by mouth daily. 14 each 0   No current facility-administered medications for this visit.    VITAL SIGNS: There were no vitals taken for this visit. There were no vitals filed for this visit.  Estimated body mass index is 19.72  kg/m as calculated from the following:   Height as of 06/30/22: 5\' 6"  (1.676 m).   Weight as of 07/14/22: 122 lb 3.2 oz (55.4 kg).   PERFORMANCE STATUS (ECOG) : 1 - Symptomatic but completely ambulatory  IMPRESSION:   Pain Sandra Horne endorses ongoing pain which she describes as constant. Her daughter reports she is out of her pain medications again and has been trying to manage but is miserable. I discussed at length that her medications does not have automatic  refills and patient is required to contact our office 2-3 days prior to completion to gain authorization for refill.  Daughter verbalized understanding confirming office contact information.  I also emphasized requirements to continue to receive medications which includes regular office visits and or virtual visits as defined in pain contract.  Both patient and daughter verbalized understanding.  Patient states at this time she is unable to come to the office for a visit continue to states she has "other things" going on.  I did agree to continue with medications as long as we maintain virtual visits.  She expressed appreciation.  I discussed current regimen at length.  She has been taking oxycodone 5 mg every 6-8 hours.  Reports her pain is not well-managed and she is having to take Tylenol or ibuprofen in between with minimal relief.  Education provided on the use of long-acting pain medication to offer additional pain relief in addition to breakthrough medication.  Previously she was reluctant to this however is now open to trying Sandra Horne December that her pain has worsened.  Daughter and patient verbalized understanding of use of medication including frequency and expectations.  Recommended bowel regimen in the setting of opioid use.  2.  Goals of care  08/04/22- I discussed at length with patient and daughter her current illness and disease trajectory.  Both verbalized their understanding and is realistic in their expectations.  Patient is aware that is her choice to not continue with any forms of treatment such as chemotherapy or radiation however her cancer will continue to progress and at some point she will faced end-of-life.  They verbalized understanding.  Daughter states she supports her mother's decisions.  We discussed outpatient hospice and palliative support.  Daughter states they are not interested at this time not wanting people coming in and out of her home frequently.  They are aware that services  may be initiated at any time by discussing with medical team.  DNR/DNI has been confirmed.  I again approach discussions regarding advanced directives and MOST form.  They are aware we can complete documents electronically during next visit.  07/14/22- I created space and opportunity for Ms. Merriman to discuss her thoughts and feelings on her current illness and disease.  She acknowledges reasoning for radiation therapy.  Confirms previous wishes for no systemic therapies.  She understands her cancer will continue to progress and eventually lead to end-of-life.  States she is focusing on the positive and wanting to "get better".  I had an open and direct conversation with her providing education on her disease trajectory emphasizing that the medical team and aggressively manage her symptoms and continue to offer palliative radiation by her stage IV lung cancer is not curative.  Patient verbalized understanding stating she has many different health problems which she is well aware of and is trying to prioritize what to "handle" and not.  Shares that she has been established with a PCP and she previously did not have one.  Is referring to  her bilateral lower extremity edema stating she just needs the swelling to go down.  We discussed what is most important to patient.  She states to manage her pain while taking things one day at a time.  Continues to request full code/full scope care.  We discussed what a full code scenario would look like for her. I emphasize poor quality of life and minimal chance of survival given significant disease. Corri verbalized understanding expressing appreciation and that she would not want to heroic measures and would desire a natural death when that time comes. Education provided on DNR/DNI.  She again verbalizes understanding confirming wishes for DNR.  I approach discussions around MOST form.  Extensive education regarding artificial feedings and other life-prolonging measures.   Patient is clear that if she is nearing end-of-life she would not want her life prolonged by artificial feedings and hydration knowing she would not enjoy this as she enjoys eating by mouth.  Confirms she would not want PEG tube at any point.  I encouraged her to continue discussions with her family and the importance of limitations in the situation of further health decline.  She would like to complete documentations during follow-up allow her time to have discussions with her family.  Goals of Care (02/01/2022) I created space and opportunity for patient to express thoughts and feelings in regards to her current illness.  Ms. Katrinka Blazing states she is ready to return home as she is currently living with her daughter.  Feels that she is doing much better and does not wish to pursue treatments and take the risk of feeling poorly again.  Education provided on risk of disease progression in the setting of no interventions.  She verbalized understanding again stating she wishes to continue taking things 1 day at a time allowing herself every opportunity to continue to thrive is much as possible while feeling good.  She is much appreciative of her current quality of life.  She understands if her disease progresses she may become more symptomatic and not be a candidate for further treatment.  She is clear in her expressed wishes that she will address that when the time comes.   PLAN: Ongoing goals of care discussions.  Patient is clear and expressed wishes to continue to treat the treatable however with a desire for natural death.  Confirms wishes for DNR/DNI and no artificial feeding/PEG.  Would like to complete documents at follow-up visit allow her time to discuss with her family.   Oxycodone 10 mg as needed every 6 hours as needed  Xtampza 9 mg every 12 hours I will plan to see patient back in 1-2 weeks.  She knows to contact our office sooner if needed.   Patient expressed understanding and was in agreement  with this plan. She also understands that She can call the clinic at any time with any questions, concerns, or complaints.   Any controlled substances utilized were prescribed in the context of palliative care. PDMP has been reviewed.    Visit consisted of counseling and education dealing with the complex and emotionally intense issues of symptom management and palliative care in the setting of serious and potentially life-threatening illness.Greater than 50%  of this time was spent counseling and coordinating care related to the above assessment and plan.  Willette Alma, AGPCNP-BC  Palliative Medicine Team/[facility]  *Please note that this is a verbal dictation therefore any spelling or grammatical errors are due to the "Dragon Medical One" system interpretation.

## 2022-08-18 ENCOUNTER — Inpatient Hospital Stay: Payer: 59 | Admitting: Nurse Practitioner

## 2022-08-24 ENCOUNTER — Other Ambulatory Visit (HOSPITAL_COMMUNITY): Payer: 59

## 2022-08-25 ENCOUNTER — Ambulatory Visit (HOSPITAL_COMMUNITY): Payer: 59 | Attending: Radiation Oncology

## 2022-08-26 ENCOUNTER — Telehealth: Payer: Self-pay | Admitting: Radiation Therapy

## 2022-08-26 NOTE — Telephone Encounter (Signed)
I was unable to reach the patient on the phone, but did speak with her daughter. I called about the missed brain MRI yesterday. I have asked if Layni wants to reschedule this scan or if she has decided against follow-up imaging since she is no longer interested in treatment. Benetta Spar is going to talk to her mom and give [place] a call back.   Jalene Mullet R.T.(R)(T) Radiation Special Procedures Navigator

## 2022-08-30 ENCOUNTER — Ambulatory Visit: Payer: 59 | Admitting: Radiation Oncology

## 2022-09-02 NOTE — Progress Notes (Deleted)
Palliative Medicine [facility]  [phone] [phone]) [phone]   Name: Sandra Horne Date: 09/02/2022 MRN: [id number]  DOB: Dec 02, 1955  Patient Care Team: Pcp, No as PCP - General Pickenpack-Cousar, Arty Baumgartner, NP as Nurse Practitioner (Nurse Practitioner)   I connected with Conception Oms Blaker on 09/02/22 at  1:30 PM EDT by phone and verified that I am speaking with the correct person using two identifiers.   I discussed the limitations, risks, security and privacy concerns of performing an evaluation and management service by telemedicine and the availability of in-person appointments. I also discussed with the patient that there may be a patient responsible charge related to this service. The patient expressed understanding and agreed to proceed.   Other persons participating in the visit and their role in the encounter: [place], daughter   Patient's location: home  Provider's location: [facility]   Chief Complaint: f/u of symptom management    INTERVAL HISTORY: Sandra Horne is a 67 y.o. female with oncologic medical history including primary lung CA (left infrahilar mass dx Sept 2021 but lost to f/u, last radiation treatment 10/29/2021), chronic anemia, right femoral neck fracture (s/p right hemiarthroplasty on 10/13/2021) and tobacco abuse. She was recently admitted on 10/31/2021 with new right-sided weakness. CT of head revealed left parietal lobe mass with vasogenic edema. She is currently undergoing SRS.  Palliative ask to see for symptom management and goals of care.   SOCIAL HISTORY:     reports that she has been smoking cigarettes. She has never used smokeless tobacco. She reports that she does not currently use alcohol. She reports that she does not currently use drugs after having used the following drugs: Marijuana.  ADVANCE DIRECTIVES:    CODE STATUS:   PAST MEDICAL HISTORY: Past Medical History:  Diagnosis Date   Cancer (HCC)    Lung  and Brain mets   Lung cancer (HCC)     ALLERGIES:  has No Known Allergies.  MEDICATIONS:  Current Outpatient Medications  Medication Sig Dispense Refill   acetaminophen (TYLENOL) 325 MG tablet Take 2 tablets (650 mg total) by mouth every 6 (six) hours as needed for mild pain or moderate pain (or Fever >/= 101).     guaiFENesin-dextromethorphan (ROBITUSSIN DM) 100-10 MG/5ML syrup Take 5 mLs by mouth every 4 (four) hours as needed for cough. (Patient not taking: Reported on 07/09/2022) 118 mL 0   levETIRAcetam (KEPPRA) 500 MG tablet Take 1 tablet (500 mg total) by mouth 2 (two) times daily. (Patient not taking: Reported on 07/09/2022) 60 tablet 1   megestrol (MEGACE) 40 MG tablet Take 1 tablet (40 mg total) by mouth daily. 30 tablet 0   nicotine (NICODERM CQ - DOSED IN MG/24 HOURS) 14 mg/24hr patch Place 1 patch (14 mg total) onto the skin daily as needed (nicotine craving). (Patient not taking: Reported on 07/09/2022) 28 patch 0   oxyCODONE 10 MG TABS Take 0.5 tablets (5 mg total) by mouth every 6 (six) hours as needed for severe pain. 60 tablet 0   oxyCODONE ER (XTAMPZA ER) 9 MG C12A Take 9 mg by mouth every 12 (twelve) hours. 30 capsule 0   polyethylene glycol (MIRALAX / GLYCOLAX) 17 g packet Take 17 g by mouth daily. 14 each 0   No current facility-administered medications for this visit.    VITAL SIGNS: There were no vitals taken for this visit. There were no vitals filed for this visit.  Estimated body mass index is 19.72  kg/m as calculated from the following:   Height as of 06/30/22: 5\' 6"  (1.676 m).   Weight as of 07/14/22: 122 lb 3.2 oz (55.4 kg).   PERFORMANCE STATUS (ECOG) : 1 - Symptomatic but completely ambulatory  IMPRESSION:   Pain Aviva endorses ongoing pain which she describes as constant. Her daughter reports she is out of her pain medications again and has been trying to manage but is miserable. I discussed at length that her medications does not have automatic  refills and patient is required to contact our office 2-3 days prior to completion to gain authorization for refill.  Daughter verbalized understanding confirming office contact information.  I also emphasized requirements to continue to receive medications which includes regular office visits and or virtual visits as defined in pain contract.  Both patient and daughter verbalized understanding.  Patient states at this time she is unable to come to the office for a visit continue to states she has "other things" going on.  I did agree to continue with medications as long as we maintain virtual visits.  She expressed appreciation.  I discussed current regimen at length.  She has been taking oxycodone 5 mg every 6-8 hours.  Reports her pain is not well-managed and she is having to take Tylenol or ibuprofen in between with minimal relief.  Education provided on the use of long-acting pain medication to offer additional pain relief in addition to breakthrough medication.  Previously she was reluctant to this however is now open to trying Jasmine December that her pain has worsened.  Daughter and patient verbalized understanding of use of medication including frequency and expectations.  Recommended bowel regimen in the setting of opioid use.  2.  Goals of care  08/04/22- I discussed at length with patient and daughter her current illness and disease trajectory.  Both verbalized their understanding and is realistic in their expectations.  Patient is aware that is her choice to not continue with any forms of treatment such as chemotherapy or radiation however her cancer will continue to progress and at some point she will faced end-of-life.  They verbalized understanding.  Daughter states she supports her mother's decisions.  We discussed outpatient hospice and palliative support.  Daughter states they are not interested at this time not wanting people coming in and out of her home frequently.  They are aware that services  may be initiated at any time by discussing with medical team.  DNR/DNI has been confirmed.  I again approach discussions regarding advanced directives and MOST form.  They are aware we can complete documents electronically during next visit.  07/14/22- I created space and opportunity for Ms. Zegarra to discuss her thoughts and feelings on her current illness and disease.  She acknowledges reasoning for radiation therapy.  Confirms previous wishes for no systemic therapies.  She understands her cancer will continue to progress and eventually lead to end-of-life.  States she is focusing on the positive and wanting to "get better".  I had an open and direct conversation with her providing education on her disease trajectory emphasizing that the medical team and aggressively manage her symptoms and continue to offer palliative radiation by her stage IV lung cancer is not curative.  Patient verbalized understanding stating she has many different health problems which she is well aware of and is trying to prioritize what to "handle" and not.  Shares that she has been established with a PCP and she previously did not have one.  Is referring to  her bilateral lower extremity edema stating she just needs the swelling to go down.  We discussed what is most important to patient.  She states to manage her pain while taking things one day at a time.  Continues to request full code/full scope care.  We discussed what a full code scenario would look like for her. I emphasize poor quality of life and minimal chance of survival given significant disease. Para verbalized understanding expressing appreciation and that she would not want to heroic measures and would desire a natural death when that time comes. Education provided on DNR/DNI.  She again verbalizes understanding confirming wishes for DNR.  I approach discussions around MOST form.  Extensive education regarding artificial feedings and other life-prolonging measures.   Patient is clear that if she is nearing end-of-life she would not want her life prolonged by artificial feedings and hydration knowing she would not enjoy this as she enjoys eating by mouth.  Confirms she would not want PEG tube at any point.  I encouraged her to continue discussions with her family and the importance of limitations in the situation of further health decline.  She would like to complete documentations during follow-up allow her time to have discussions with her family.  Goals of Care (02/01/2022) I created space and opportunity for patient to express thoughts and feelings in regards to her current illness.  Ms. Katrinka Blazing states she is ready to return home as she is currently living with her daughter.  Feels that she is doing much better and does not wish to pursue treatments and take the risk of feeling poorly again.  Education provided on risk of disease progression in the setting of no interventions.  She verbalized understanding again stating she wishes to continue taking things 1 day at a time allowing herself every opportunity to continue to thrive is much as possible while feeling good.  She is much appreciative of her current quality of life.  She understands if her disease progresses she may become more symptomatic and not be a candidate for further treatment.  She is clear in her expressed wishes that she will address that when the time comes.   PLAN: Ongoing goals of care discussions.  Patient is clear and expressed wishes to continue to treat the treatable however with a desire for natural death.  Confirms wishes for DNR/DNI and no artificial feeding/PEG.  Would like to complete documents at follow-up visit allow her time to discuss with her family.   Oxycodone 10 mg as needed every 6 hours as needed  Xtampza 9 mg every 12 hours I will plan to see patient back in 1-2 weeks.  She knows to contact our office sooner if needed.   Patient expressed understanding and was in agreement  with this plan. She also understands that She can call the clinic at any time with any questions, concerns, or complaints.   Any controlled substances utilized were prescribed in the context of palliative care. PDMP has been reviewed.    Visit consisted of counseling and education dealing with the complex and emotionally intense issues of symptom management and palliative care in the setting of serious and potentially life-threatening illness.Greater than 50%  of this time was spent counseling and coordinating care related to the above assessment and plan.  Willette Alma, AGPCNP-BC  Palliative Medicine Team/[facility]  *Please note that this is a verbal dictation therefore any spelling or grammatical errors are due to the "[employer]" system interpretation.

## 2022-09-06 ENCOUNTER — Inpatient Hospital Stay: Payer: 59 | Attending: Physician Assistant | Admitting: Nurse Practitioner

## 2022-09-07 ENCOUNTER — Telehealth (INDEPENDENT_AMBULATORY_CARE_PROVIDER_SITE_OTHER): Payer: Self-pay | Admitting: Primary Care

## 2022-09-08 ENCOUNTER — Ambulatory Visit (INDEPENDENT_AMBULATORY_CARE_PROVIDER_SITE_OTHER): Payer: 59 | Admitting: Primary Care

## 2022-09-11 DIAGNOSIS — E440 Moderate protein-calorie malnutrition: Secondary | ICD-10-CM | POA: Diagnosis not present

## 2022-09-11 DIAGNOSIS — C7931 Secondary malignant neoplasm of brain: Secondary | ICD-10-CM | POA: Diagnosis not present

## 2022-09-11 DIAGNOSIS — R531 Weakness: Secondary | ICD-10-CM | POA: Diagnosis not present

## 2022-09-13 ENCOUNTER — Encounter (HOSPITAL_COMMUNITY): Payer: Self-pay | Admitting: Emergency Medicine

## 2022-09-13 ENCOUNTER — Inpatient Hospital Stay (HOSPITAL_COMMUNITY)
Admission: EM | Admit: 2022-09-13 | Discharge: 2022-09-14 | DRG: 054 | Disposition: A | Discharge status: Hospice | Payer: 59 | Attending: Family Medicine | Admitting: Family Medicine

## 2022-09-13 ENCOUNTER — Other Ambulatory Visit: Payer: Self-pay

## 2022-09-13 DIAGNOSIS — F17210 Nicotine dependence, cigarettes, uncomplicated: Secondary | ICD-10-CM | POA: Diagnosis present

## 2022-09-13 DIAGNOSIS — Z7189 Other specified counseling: Secondary | ICD-10-CM | POA: Diagnosis not present

## 2022-09-13 DIAGNOSIS — D630 Anemia in neoplastic disease: Secondary | ICD-10-CM | POA: Diagnosis present

## 2022-09-13 DIAGNOSIS — R531 Weakness: Secondary | ICD-10-CM

## 2022-09-13 DIAGNOSIS — Z823 Family history of stroke: Secondary | ICD-10-CM | POA: Diagnosis not present

## 2022-09-13 DIAGNOSIS — Z809 Family history of malignant neoplasm, unspecified: Secondary | ICD-10-CM

## 2022-09-13 DIAGNOSIS — Z79899 Other long term (current) drug therapy: Secondary | ICD-10-CM

## 2022-09-13 DIAGNOSIS — Z515 Encounter for palliative care: Secondary | ICD-10-CM

## 2022-09-13 DIAGNOSIS — C7931 Secondary malignant neoplasm of brain: Principal | ICD-10-CM | POA: Diagnosis present

## 2022-09-13 DIAGNOSIS — Z8249 Family history of ischemic heart disease and other diseases of the circulatory system: Secondary | ICD-10-CM | POA: Diagnosis not present

## 2022-09-13 DIAGNOSIS — C3432 Malignant neoplasm of lower lobe, left bronchus or lung: Principal | ICD-10-CM | POA: Diagnosis present

## 2022-09-13 DIAGNOSIS — C3490 Malignant neoplasm of unspecified part of unspecified bronchus or lung: Secondary | ICD-10-CM | POA: Diagnosis not present

## 2022-09-13 DIAGNOSIS — R630 Anorexia: Secondary | ICD-10-CM | POA: Diagnosis present

## 2022-09-13 DIAGNOSIS — R627 Adult failure to thrive: Secondary | ICD-10-CM | POA: Diagnosis present

## 2022-09-13 DIAGNOSIS — R530 Neoplastic (malignant) related fatigue: Secondary | ICD-10-CM | POA: Diagnosis not present

## 2022-09-13 DIAGNOSIS — D696 Thrombocytopenia, unspecified: Secondary | ICD-10-CM | POA: Diagnosis not present

## 2022-09-13 DIAGNOSIS — Z681 Body mass index (BMI) 19 or less, adult: Secondary | ICD-10-CM

## 2022-09-13 DIAGNOSIS — K219 Gastro-esophageal reflux disease without esophagitis: Secondary | ICD-10-CM | POA: Diagnosis present

## 2022-09-13 DIAGNOSIS — G893 Neoplasm related pain (acute) (chronic): Secondary | ICD-10-CM

## 2022-09-13 DIAGNOSIS — Z66 Do not resuscitate: Secondary | ICD-10-CM | POA: Diagnosis not present

## 2022-09-13 DIAGNOSIS — Z79891 Long term (current) use of opiate analgesic: Secondary | ICD-10-CM | POA: Diagnosis not present

## 2022-09-13 DIAGNOSIS — G936 Cerebral edema: Secondary | ICD-10-CM | POA: Diagnosis present

## 2022-09-13 DIAGNOSIS — Z96641 Presence of right artificial hip joint: Secondary | ICD-10-CM | POA: Diagnosis present

## 2022-09-13 DIAGNOSIS — Z720 Tobacco use: Secondary | ICD-10-CM | POA: Diagnosis present

## 2022-09-13 DIAGNOSIS — G9389 Other specified disorders of brain: Secondary | ICD-10-CM | POA: Diagnosis not present

## 2022-09-13 DIAGNOSIS — Z9223 Personal history of estrogen therapy: Secondary | ICD-10-CM

## 2022-09-13 LAB — CBC
HCT: 35.4 % — ABNORMAL LOW (ref 36.0–46.0)
Hemoglobin: 11.3 g/dL — ABNORMAL LOW (ref 12.0–15.0)
MCH: 28.2 pg (ref 26.0–34.0)
MCHC: 31.9 g/dL (ref 30.0–36.0)
MCV: 88.3 fL (ref 80.0–100.0)
Platelets: 353 10*3/uL (ref 150–400)
RBC: 4.01 MIL/uL (ref 3.87–5.11)
RDW: 17.6 % — ABNORMAL HIGH (ref 11.5–15.5)
WBC: 5.6 10*3/uL (ref 4.0–10.5)
nRBC: 0 % (ref 0.0–0.2)

## 2022-09-13 LAB — BASIC METABOLIC PANEL
Anion gap: 9 (ref 5–15)
BUN: 11 mg/dL (ref 8–23)
CO2: 23 mmol/L (ref 22–32)
Calcium: 9.5 mg/dL (ref 8.9–10.3)
Chloride: 106 mmol/L (ref 98–111)
Creatinine, Ser: 0.58 mg/dL (ref 0.44–1.00)
GFR, Estimated: >60 mL/min (ref >60)
Glucose, Bld: 96 mg/dL (ref 70–99)
Potassium: 3.5 mmol/L (ref 3.5–5.1)
Sodium: 138 mmol/L (ref 135–145)

## 2022-09-13 LAB — CBG MONITORING, ED: Glucose-Capillary: 91 mg/dL (ref 70–99)

## 2022-09-13 NOTE — ED Triage Notes (Addendum)
Pt via POV c/o weakness in bilateral legs and hands x 3 weeks. Pt is receiving chemo and radiation for brain cancer. Pt denies pain at time of triage but says she feels a bit SOB with altered sense of taste. A/O x 4.

## 2022-09-14 ENCOUNTER — Other Ambulatory Visit: Payer: Self-pay

## 2022-09-14 ENCOUNTER — Other Ambulatory Visit: Payer: Self-pay | Admitting: Hematology

## 2022-09-14 ENCOUNTER — Emergency Department (HOSPITAL_COMMUNITY): Payer: 59

## 2022-09-14 ENCOUNTER — Encounter (HOSPITAL_COMMUNITY): Payer: Self-pay | Admitting: Internal Medicine

## 2022-09-14 DIAGNOSIS — Z79899 Other long term (current) drug therapy: Secondary | ICD-10-CM | POA: Diagnosis not present

## 2022-09-14 DIAGNOSIS — Z823 Family history of stroke: Secondary | ICD-10-CM | POA: Diagnosis not present

## 2022-09-14 DIAGNOSIS — G9389 Other specified disorders of brain: Secondary | ICD-10-CM | POA: Diagnosis not present

## 2022-09-14 DIAGNOSIS — Z809 Family history of malignant neoplasm, unspecified: Secondary | ICD-10-CM | POA: Diagnosis not present

## 2022-09-14 DIAGNOSIS — Z7189 Other specified counseling: Secondary | ICD-10-CM | POA: Diagnosis not present

## 2022-09-14 DIAGNOSIS — C3490 Malignant neoplasm of unspecified part of unspecified bronchus or lung: Secondary | ICD-10-CM

## 2022-09-14 DIAGNOSIS — Z96641 Presence of right artificial hip joint: Secondary | ICD-10-CM | POA: Diagnosis present

## 2022-09-14 DIAGNOSIS — D630 Anemia in neoplastic disease: Secondary | ICD-10-CM | POA: Diagnosis present

## 2022-09-14 DIAGNOSIS — Z79891 Long term (current) use of opiate analgesic: Secondary | ICD-10-CM | POA: Diagnosis not present

## 2022-09-14 DIAGNOSIS — R530 Neoplastic (malignant) related fatigue: Secondary | ICD-10-CM | POA: Diagnosis present

## 2022-09-14 DIAGNOSIS — C3432 Malignant neoplasm of lower lobe, left bronchus or lung: Secondary | ICD-10-CM | POA: Diagnosis present

## 2022-09-14 DIAGNOSIS — Z66 Do not resuscitate: Secondary | ICD-10-CM | POA: Diagnosis present

## 2022-09-14 DIAGNOSIS — R531 Weakness: Secondary | ICD-10-CM

## 2022-09-14 DIAGNOSIS — D696 Thrombocytopenia, unspecified: Secondary | ICD-10-CM | POA: Diagnosis present

## 2022-09-14 DIAGNOSIS — K219 Gastro-esophageal reflux disease without esophagitis: Secondary | ICD-10-CM | POA: Diagnosis present

## 2022-09-14 DIAGNOSIS — F17210 Nicotine dependence, cigarettes, uncomplicated: Secondary | ICD-10-CM | POA: Diagnosis present

## 2022-09-14 DIAGNOSIS — Z9223 Personal history of estrogen therapy: Secondary | ICD-10-CM | POA: Diagnosis not present

## 2022-09-14 DIAGNOSIS — R630 Anorexia: Secondary | ICD-10-CM | POA: Diagnosis present

## 2022-09-14 DIAGNOSIS — G936 Cerebral edema: Secondary | ICD-10-CM | POA: Diagnosis present

## 2022-09-14 DIAGNOSIS — Z8249 Family history of ischemic heart disease and other diseases of the circulatory system: Secondary | ICD-10-CM | POA: Diagnosis not present

## 2022-09-14 DIAGNOSIS — C7931 Secondary malignant neoplasm of brain: Secondary | ICD-10-CM

## 2022-09-14 DIAGNOSIS — R627 Adult failure to thrive: Secondary | ICD-10-CM | POA: Diagnosis not present

## 2022-09-14 DIAGNOSIS — Z681 Body mass index (BMI) 19 or less, adult: Secondary | ICD-10-CM | POA: Diagnosis not present

## 2022-09-14 LAB — URINALYSIS, ROUTINE W REFLEX MICROSCOPIC
Bilirubin Urine: NEGATIVE
Glucose, UA: NEGATIVE mg/dL
Hgb urine dipstick: NEGATIVE
Ketones, ur: 5 mg/dL — AB
Leukocytes,Ua: NEGATIVE
Nitrite: NEGATIVE
Protein, ur: 30 mg/dL — AB
Specific Gravity, Urine: 1.014 (ref 1.005–1.030)
pH: 5 (ref 5.0–8.0)

## 2022-09-14 MED ORDER — GABAPENTIN 100 MG PO CAPS
100.0000 mg | ORAL_CAPSULE | Freq: Two times a day (BID) | ORAL | Status: DC
  Administered 2022-09-14: 100 mg via ORAL
  Filled 2022-09-14: qty 1

## 2022-09-14 MED ORDER — MIRTAZAPINE 15 MG PO TABS: 15.0000 mg | ORAL_TABLET | Freq: Every day | ORAL | 3 refills | Status: DC

## 2022-09-14 MED ORDER — LEVETIRACETAM 500 MG PO TABS: 500.0000 mg | ORAL_TABLET | Freq: Two times a day (BID) | ORAL | 2 refills | Status: DC

## 2022-09-14 MED ORDER — ONDANSETRON HCL 4 MG PO TABS: 4.0000 mg | ORAL_TABLET | Freq: Four times a day (QID) | ORAL | Status: DC | PRN

## 2022-09-14 MED ORDER — DEXAMETHASONE 2 MG PO TABS: 2.0000 mg | ORAL_TABLET | Freq: Every day | ORAL | 0 refills | Status: DC

## 2022-09-14 MED ORDER — GABAPENTIN 100 MG PO CAPS: 100.0000 mg | ORAL_CAPSULE | Freq: Two times a day (BID) | ORAL | 3 refills | Status: DC

## 2022-09-14 MED ORDER — POLYETHYLENE GLYCOL 3350 17 G PO PACK
17.0000 g | PACK | Freq: Every day | ORAL | Status: DC
  Administered 2022-09-14: 17 g via ORAL
  Filled 2022-09-14: qty 1

## 2022-09-14 MED ORDER — SENNOSIDES-DOCUSATE SODIUM 8.6-50 MG PO TABS: 2.0000 | ORAL_TABLET | Freq: Two times a day (BID) | ORAL | 1 refills | Status: DC

## 2022-09-14 MED ORDER — ACETAMINOPHEN 500 MG PO TABS
1000.0000 mg | ORAL_TABLET | Freq: Three times a day (TID) | ORAL | Status: DC
  Administered 2022-09-14 (×2): 1000 mg via ORAL
  Filled 2022-09-14 (×2): qty 2

## 2022-09-14 MED ORDER — ACETAMINOPHEN 325 MG PO TABS: 650.0000 mg | ORAL_TABLET | Freq: Four times a day (QID) | ORAL | Status: DC | PRN

## 2022-09-14 MED ORDER — ACETAMINOPHEN 650 MG RE SUPP: 650.0000 mg | Freq: Four times a day (QID) | RECTAL | Status: DC | PRN

## 2022-09-14 MED ORDER — XTAMPZA ER 9 MG PO C12A
9.0000 mg | EXTENDED_RELEASE_CAPSULE | Freq: Two times a day (BID) | ORAL | 0 refills | Status: DC
Start: 2022-09-14 — End: 2023-01-01

## 2022-09-14 MED ORDER — SODIUM CHLORIDE 0.9% FLUSH: 3.0000 mL | Freq: Two times a day (BID) | INTRAVENOUS | Status: DC

## 2022-09-14 MED ORDER — LACTULOSE 10 GM/15ML PO SOLN
60.0000 g | Freq: Once | ORAL | Status: AC
  Administered 2022-09-14: 60 g via ORAL
  Filled 2022-09-14: qty 90

## 2022-09-14 MED ORDER — TRAZODONE HCL 50 MG PO TABS: 50.0000 mg | ORAL_TABLET | Freq: Every evening | ORAL | Status: DC | PRN

## 2022-09-14 MED ORDER — POLYETHYLENE GLYCOL 3350 17 G PO PACK: 17.0000 g | PACK | Freq: Two times a day (BID) | ORAL | 2 refills | Status: DC

## 2022-09-14 MED ORDER — OXYCODONE HCL 5 MG PO TABS: 5.0000 mg | ORAL_TABLET | Freq: Four times a day (QID) | ORAL | Status: DC | PRN

## 2022-09-14 MED ORDER — SODIUM CHLORIDE 0.9 % IV SOLN: INTRAVENOUS | Status: DC | PRN

## 2022-09-14 MED ORDER — OXYCODONE HCL 10 MG PO TABS
5.0000 mg | ORAL_TABLET | Freq: Four times a day (QID) | ORAL | 0 refills | Status: DC | PRN
Start: 2022-09-14 — End: 2023-01-01

## 2022-09-14 MED ORDER — OXYCODONE HCL ER 10 MG PO T12A
10.0000 mg | EXTENDED_RELEASE_TABLET | Freq: Two times a day (BID) | ORAL | Status: DC
  Administered 2022-09-14: 10 mg via ORAL
  Filled 2022-09-14: qty 1

## 2022-09-14 MED ORDER — PANTOPRAZOLE SODIUM 40 MG PO TBEC: 40.0000 mg | DELAYED_RELEASE_TABLET | Freq: Every day | ORAL | 1 refills | Status: DC

## 2022-09-14 MED ORDER — SENNA 8.6 MG PO TABS: 2.0000 | ORAL_TABLET | Freq: Every day | ORAL | Status: DC

## 2022-09-14 MED ORDER — ONDANSETRON HCL 4 MG/2ML IJ SOLN: 4.0000 mg | Freq: Four times a day (QID) | INTRAMUSCULAR | Status: DC | PRN

## 2022-09-14 MED ORDER — DEXAMETHASONE 4 MG PO TABS: 2.0000 mg | ORAL_TABLET | Freq: Every day | ORAL | Status: DC

## 2022-09-14 MED ORDER — POLYETHYLENE GLYCOL 3350 17 G PO PACK: 17.0000 g | PACK | Freq: Every day | ORAL | Status: DC | PRN

## 2022-09-14 MED ORDER — BISACODYL 10 MG RE SUPP: 10.0000 mg | Freq: Every day | RECTAL | Status: DC | PRN

## 2022-09-14 MED ORDER — SODIUM CHLORIDE 0.9% FLUSH: 3.0000 mL | INTRAVENOUS | Status: DC | PRN

## 2022-09-14 MED ORDER — HEPARIN SODIUM (PORCINE) 5000 UNIT/ML IJ SOLN
5000.0000 [IU] | Freq: Three times a day (TID) | INTRAMUSCULAR | Status: DC
  Administered 2022-09-14: 5000 [IU] via SUBCUTANEOUS
  Filled 2022-09-14: qty 1

## 2022-09-14 MED ORDER — DEXAMETHASONE SODIUM PHOSPHATE 10 MG/ML IJ SOLN
10.0000 mg | Freq: Once | INTRAMUSCULAR | Status: AC
  Administered 2022-09-14: 10 mg via INTRAVENOUS
  Filled 2022-09-14: qty 1

## 2022-09-14 MED ORDER — BISACODYL 10 MG RE SUPP: 10.0000 mg | Freq: Every day | RECTAL | 0 refills | Status: DC | PRN

## 2022-09-14 MED ORDER — ACETAMINOPHEN 500 MG PO TABS: 1000.0000 mg | ORAL_TABLET | Freq: Three times a day (TID) | ORAL | 0 refills | Status: DC

## 2022-09-14 NOTE — Progress Notes (Signed)
Mobility Specialist Progress Note:   09/14/22 1025  Mobility  Activity Ambulated with assistance to bathroom  Level of Assistance Minimal assist, patient does 75% or more  Assistive Device Front wheel walker  Distance Ambulated (ft) 20 ft  Range of Motion/Exercises Active;Right arm;Left arm  Activity Response Tolerated well  Mobility Referral Yes  $Mobility charge 1 Mobility  Mobility Specialist Start Time (ACUTE ONLY) 1025  Mobility Specialist Stop Time (ACUTE ONLY) 1037  Mobility Specialist Time Calculation (min) (ACUTE ONLY) 12 min   Pt requested assistance to bathroom, family member at bedside. Ambulated via RW to bathroom. MinA required to stand. Tolerated well, had significant weakness in BLE. Returned pt to bed, all needs met, bed alarm on.   Feliciana Rossetti Mobility Specialist Please contact via Special educational needs teacher or  Rehab office at [phone]

## 2022-09-14 NOTE — Discharge Instructions (Signed)
)   discharge home with hospice care

## 2022-09-14 NOTE — Discharge Summary (Signed)
Sandra Horne, is a 67 y.o. female  DOB 1955-08-15  MRN [id number].  Admission date:  09/13/2022  Admitting Physician  Frankey Shown, DO  Discharge Date:  09/14/2022   Primary MD  Pcp, No  Recommendations for primary care physician for things to follow:   discharge home with hospice care   Admission Diagnosis  Generalized weakness [R53.1] 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, C79.31]   Discharge Diagnosis  Generalized weakness [R53.1] 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, C79.31]    Principal Problem:   Generalized weakness Active Problems:   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 (HCC)   Tobacco abuse   Thrombocytopenia (HCC)   Primary cancer of left lower lobe of lung (HCC)   Malignant neoplasm metastatic to brain [facility])      Past Medical History:  Diagnosis Date   Cancer (HCC)    Lung and Brain mets   Lung cancer [facility])     Past Surgical History:  Procedure Laterality Date   BRONCHIAL BRUSHINGS  03/26/2021   Procedure: BRONCHIAL BRUSHINGS;  Surgeon: Luciano Cutter, MD;  Location: [facility];  Service: Cardiopulmonary;;   BRONCHIAL NEEDLE ASPIRATION BIOPSY  03/26/2021   Procedure: BRONCHIAL NEEDLE ASPIRATION BIOPSIES;  Surgeon: Luciano Cutter, MD;  Location: [facility];  Service: Cardiopulmonary;;   BRONCHIAL WASHINGS  03/26/2021   Procedure: BRONCHIAL WASHINGS;  Surgeon: Luciano Cutter, MD;  Location: [facility];  Service: Cardiopulmonary;;   ECTOPIC PREGNANCY SURGERY     ENDOBRONCHIAL ULTRASOUND Bilateral 03/26/2021   Procedure: ENDOBRONCHIAL ULTRASOUND;  Surgeon: Luciano Cutter, MD;  Location: [facility];  Service: Cardiopulmonary;  Laterality: Bilateral;   HEMOSTASIS CONTROL  03/26/2021   Procedure: HEMOSTASIS CONTROL;  Surgeon: Luciano Cutter, MD;  Location: [facility];  Service: Cardiopulmonary;;   HIP  ARTHROPLASTY Right 10/13/2021   Procedure: ARTHROPLASTY BIPOLAR HIP (HEMIARTHROPLASTY);  Surgeon: Vickki Hearing, MD;  Location: [facility];  Service: Orthopedics;  Laterality: Right;       HPI  from the history and physical done on the day of admission:    Sandra Horne  is a 67 y.o. female with past medical history relevant for history of lung cancer, mets to brain has had radiation but no recent treatments. Since last several weeks she has been having pain and weakness in both hands and legs, R>L. She has been taking pain medications to help with her symptoms but has run out.  Patient now requires assistance in doing ADLs including getting dressed and getting around the house.   --Family reports worsening generalized weakness and failure to thrive in adult in the setting of cancer of the lung with metastasis to the brain - Discussed with oncologist Dr. Ellin Saba -Patient previously refused systemic chemotherapy--she was treated with single agent immunotherapy previously and previously had radiation to left hilar mass as well as whole brain radiation -Unfortunately patient has been getting much weaker over the last 3 weeks requiring more more assistance with ADLs -Palliative care consult  requested--- please see consult note -WBC is 5.6 hemoglobin is 11.3 with platelets 353 -UA is not consistent with a UTI -BMP WNL -CT head shows-Diffuse left cerebral vasogenic edema and mass effect with 7 mm of left-to-right midline shift. Similar to slight increase in size of the dominant left posterior frontal lobe cystic lesion. Unchanged left occipital cystic lesion, otherwise no new acute intracranial abnormality. 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 Similar to slight increase in size of the dominant left  posterior frontal lobe cystic lesion. Unchanged left occipital cystic lesion, otherwise no new acute intracranial abnormality. -Discussed with oncologist Dr. Ellin Saba -Patient previously refused systemic chemotherapy--she was treated with single agent immunotherapy previously and previously had radiation to left hilar mass as well as whole brain radiation -Unfortunately patient has been getting much weaker over the last 3 weeks requiring more more assistance with ADLs -Family reports worsening generalized weakness and failure to thrive in adult in the setting of cancer of the lung with metastasis to the brain Since last several weeks she has been having pain and weakness in both hands and legs, R>L. She has been taking pain medications to help with her symptoms but has run out.  Patient now requires assistance in doing ADLs including getting dressed and getting around the house. -Oncology consult from [facility] requested -Patient experiencing increased pain and weakness she ran out of her pain medications recently -Palliative care consult requested--- please see consult note -Pain medications have been adjusted -Decadron as prescribed -Plan is to discharge home with hospice  2) social/ethics----palliative care consult requested to further delineate goals of care and advanced directives -further Conversations with patient's husband at bedside as patient appears to have intermittent episodes of confusion and disorientation -DNR/DNI -Please see separate documentation from palliative care provider  3)Anorexia--stop Megace as this has not been effective,  try Remeron instead  4)GERD--continue Protonix as prescribed  5) chronic anemia of malignancy/chronic disease please--Hgb is actually stable above 11 at this time -  Discharge Condition: Overall prognosis is  poor  Follow UP--- hospice services   Consults obtained -oncology and palliative care services  Diet and Activity recommendation:  As advised  Discharge Instructions    Discharge Instructions     Call MD for:  difficulty breathing, headache or visual disturbances   Complete by: As directed    Call MD for:  persistant dizziness or light-headedness   Complete by: As directed    Call MD for:  persistant nausea and vomiting   Complete by: As directed    Call MD for:  temperature >100.4   Complete by: As directed    Diet - low sodium heart healthy   Complete by: As directed    Discharge instructions   Complete by: As directed    1) discharge home with hospice care   Increase activity slowly   Complete by: As directed         Discharge Medications     Allergies as of 09/14/2022   No Known Allergies      Medication List     STOP taking these medications    guaiFENesin-dextromethorphan 100-10 MG/5ML syrup Commonly known as: ROBITUSSIN DM   megestrol 40 MG tablet Commonly  known as: MEGACE   nicotine 14 mg/24hr patch Commonly known as: NICODERM CQ - dosed in mg/24 hours       TAKE these medications    acetaminophen 500 MG tablet Commonly known as: TYLENOL Take 2 tablets (1,000 mg total) by mouth 3 (three) times daily. What changed:  medication strength how much to take when to take this reasons to take this   bisacodyl 10 MG suppository Commonly known as: DULCOLAX Place 1 suppository (10 mg total) rectally daily as needed for moderate constipation.   dexamethasone 2 MG tablet Commonly known as: DECADRON Take 1 tablet (2 mg total) by mouth daily with breakfast. Start taking on: September 15, 2022   gabapentin 100 MG capsule Commonly known as: NEURONTIN Take 1 capsule (100 mg total) by mouth 2 (two) times daily.   levETIRAcetam 500 MG tablet Commonly known as: KEPPRA Take 1 tablet (500 mg total) by mouth 2 (two) times daily.   mirtazapine 15 MG  tablet Commonly known as: Remeron Take 1 tablet (15 mg total) by mouth at bedtime.   Oxycodone HCl 10 MG Tabs Take 0.5 tablets (5 mg total) by mouth every 6 (six) hours as needed (severe pain). What changed: reasons to take this   pantoprazole 40 MG tablet Commonly known as: Protonix Take 1 tablet (40 mg total) by mouth daily.   polyethylene glycol 17 g packet Commonly known as: MIRALAX / GLYCOLAX Take 17 g by mouth 2 (two) times daily. What changed: when to take this   senna-docusate 8.6-50 MG tablet Commonly known as: Senokot-S Take 2 tablets by mouth 2 (two) times daily.   Xtampza ER 9 MG C12a Generic drug: oxyCODONE ER Take 9 mg by mouth every 12 (twelve) hours.        Major procedures and Radiology Reports - PLEASE review detailed and final reports for all details, in brief -   CT Head Wo Contrast  Result Date: 09/14/2022 CLINICAL DATA:  Weakness in bilateral legs and hands. Chemo and radiation for brain cancer. EXAM: CT HEAD WITHOUT CONTRAST TECHNIQUE: Contiguous axial images were obtained from the base of the skull through the vertex without intravenous contrast. RADIATION DOSE REDUCTION: This exam was performed according to the departmental dose-optimization program which includes automated exposure control, adjustment of the mA and/or kV according to patient size and/or use of iterative reconstruction technique. COMPARISON:  CT head 07/09/2022 FINDINGS: Brain: Similar to slight increase in size of the fluid attenuation lesion in the posterior left frontal lobe (6/12) now measuring 16 x 14 mm in the axial plane, previously 15 x 13 mm. Unchanged 7 mm fluid attenuation lesion in the left occipital lobe (6/21). Diffuse left cerebral vasogenic edema and associated mass effect is unchanged. Midline shift again measures 7 mm from left to right. No evidence of acute infarct. No intracranial hemorrhage. Stable ventricular caliber. Vascular: No hyperdense vessel or unexpected  calcification. Skull: No acute fracture or destructive osseous lesion. Sinuses/Orbits: No acute finding. Other: None. IMPRESSION: 1. Minimal if any change from 07/09/2022. Diffuse left cerebral vasogenic edema and mass effect with 7 mm of left-to-right midline shift. Similar to slight increase in size of the dominant left posterior frontal lobe cystic lesion. Unchanged left occipital cystic lesion. 2. No evidence of acute intracranial abnormality. Electronically Signed   By: Minerva Fester M.D.   On: 09/14/2022 03:58    Today   Subjective    Lashana Buening today has no additional new complaints - Husband at bedside, questions answered  No fever  Or chills  -further Conversations with patient's husband at bedside as patient appears to have intermittent episodes of confusion and disorientation  No Nausea, Vomiting or Diarrhea   Patient has been seen and examined prior to discharge   Objective   Blood pressure 108/71, pulse 63, temperature 97.6 F (36.4 C), temperature source Oral, resp. rate 20, height 5\' 5"  (1.651 m), weight 48 kg, SpO2 100 %.  No intake or output data in the 24 hours ending 09/14/22 1714  Exam Physical Examination: General appearance - alert,  in no distress  Mental status - alert, oriented to person, place, and time, (intermittent episodes of confusion and disorientation noted) Eyes - sclera anicteric Neck - supple, no JVD elevation , Chest - clear  to auscultation bilaterally, symmetrical air movement,  Heart - S1 and S2 normal, regular  Abdomen - soft, nontender, nondistended, +BS Neurological - screening mental status exam normal, neck supple without rigidity, cranial nerves II through XII intact, DTR's normal and symmetric Extremities - no pedal edema noted, intact peripheral pulses  Skin - warm, dry   Data Review   CBC w Diff:  Lab Results  Component Value Date   WBC 5.6 09/13/2022   HGB 11.3 (L) 09/13/2022   HGB 10.4 (L) 06/02/2021   HCT  35.4 (L) 09/13/2022   PLT 353 09/13/2022   PLT 337 06/02/2021   LYMPHOPCT 16 07/09/2022   MONOPCT 10 07/09/2022   EOSPCT 3 07/09/2022   BASOPCT 1 07/09/2022    CMP:  Lab Results  Component Value Date   NA 138 09/13/2022   K 3.5 09/13/2022   CL 106 09/13/2022   CO2 23 09/13/2022   BUN 11 09/13/2022   CREATININE 0.58 09/13/2022   CREATININE 0.71 10/05/2021   PROT 5.6 (L) 07/09/2022   ALBUMIN 2.4 (L) 07/09/2022   BILITOT 1.0 07/09/2022   BILITOT 0.5 06/02/2021   ALKPHOS 65 07/09/2022   AST 12 (L) 07/09/2022   AST 17 06/02/2021   ALT 8 07/09/2022   ALT 25 06/02/2021  .  Total Discharge time is about 33 minutes  Shon Hale M.D on 09/14/2022 at 5:14 PM  Go to [email or web address] -  for contact info  [facility] - Office  [phone]

## 2022-09-14 NOTE — Progress Notes (Signed)
Patient and family states understanding of discharge instructions. 

## 2022-09-14 NOTE — TOC Initial Note (Signed)
Transition of Care [facility]) - Initial/Assessment Note    Patient Details  Name: Sandra Horne MRN: [id number] Date of Birth: 07/30/1955  [facility] CM/SW Contact:    Karn Cassis, LCSW Phone Number: 09/14/2022, 2:44 PM  Clinical Narrative:  Pt reports she lives with her daughter, however, her husband will be retiring and she plans to return home with him now. Palliative consulted due to metastatic cancer.  Pt and husband agreeable to hospice. No preference on agency. Referred and accepted by [facility]. RN can admit tomorrow afternoon. Pt and husband updated. Will d/c today.                  Expected Discharge Plan: Home w Hospice Care Barriers to Discharge: Barriers Resolved   Patient Goals and CMS Choice Patient states their goals for this hospitalization and ongoing recovery are:: return home   Choice offered to / list presented to : Patient, Spouse [place] ownership interest in [facility].provided to::  (n/a)    Expected Discharge Plan and Services In-house Referral: Clinical Social Work, Hospice / Palliative Care   Post Acute Care Choice: Hospice Living arrangements for the past 2 months: Single Family Home                             HH Agency: [facility] Date [facility] Agency Contacted: 09/14/22 Time HH Agency Contacted: [phone] Representative spoke with at [facility] Agency: Rae Halsted  Prior Living Arrangements/Services Living arrangements for the past 2 months: Single Family Home Lives with:: Adult Children Patient language and need for interpreter reviewed:: Yes Do you feel safe going back to the place where you live?: Yes      Need for Family Participation in Patient Care: Yes (Comment) Care giver support system in place?: Yes (comment) Current home services: DME (cane, walker, hospital bed) Criminal Activity/Legal Involvement Pertinent to Current Situation/Hospitalization: No - Comment as needed  Activities of Daily Living Home  Assistive Devices/Equipment: Environmental consultant (specify type), Wheelchair, Medical laboratory scientific officer (specify quad or straight) ADL Screening (condition at time of admission) Patient's cognitive ability adequate to safely complete daily activities?: Yes Is the patient deaf or have difficulty hearing?: No Does the patient have difficulty seeing, even when wearing glasses/contacts?: No Does the patient have difficulty concentrating, remembering, or making decisions?: Yes Patient able to express need for assistance with ADLs?: Yes Does the patient have difficulty dressing or bathing?: Yes Independently performs ADLs?: No Communication: Independent Dressing (OT): Dependent Is this a change from baseline?: Pre-admission baseline Grooming: Dependent Is this a change from baseline?: Pre-admission baseline Feeding: Independent Bathing: Dependent Is this a change from baseline?: Pre-admission baseline Toileting: Dependent Is this a change from baseline?: Pre-admission baseline In/Out Bed: Independent Walks in Home: Independent with device (comment) Does the patient have difficulty walking or climbing stairs?: No Weakness of Legs: Both Weakness of Arms/Hands: None  Permission Sought/Granted   Permission granted to share information with : Yes, Verbal Permission Granted     Permission granted to share info w AGENCY: [facility]  Permission granted to share info w Relationship: Hospice     Emotional Assessment     Affect (typically observed): Appropriate Orientation: : Oriented to Self, Oriented to Place, Oriented to Situation Alcohol / Substance Use: Not Applicable Psych Involvement: No (comment)  Admission diagnosis:  Generalized weakness [R53.1] 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, C79.31] Patient Active Problem List   Diagnosis Date Noted   Generalized  weakness 09/14/2022   Protein-calorie malnutrition, severe (HCC) 06/29/2022   Pneumonia 06/28/2022   Hypoalbuminemia 06/28/2022   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   Thrombocytosis 06/28/2022   Intracranial hemorrhage (HCC) 05/23/2022   Palliative care encounter 05/11/2022   Pain 05/11/2022   Right sided weakness 05/11/2022   Cerebral edema (HCC) 05/11/2022   Metastasis to brain (HCC) 05/11/2022   Goals of care, counseling/discussion 05/11/2022   Hypokalemia 05/08/2022   Lung cancer metastatic to brain (HCC) 05/08/2022   Thrombocytopenia (HCC) 05/08/2022   Malnutrition of moderate degree 11/02/2021   Rt Sided Weakness due to Vasogenic Brain edema 10/31/2021   Tobacco abuse 10/31/2021   Closed displaced fracture of right femoral neck (HCC) 10/12/2021   Malignant neoplasm metastatic to brain [facility]) 03/27/2021   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 (HCC) 03/26/2021   Acute encephalopathy 03/25/2021   Primary cancer of left lower lobe of lung (HCC) 01/14/2020   CLOSED FRACTURE OF UNSPECIFIED PART OF HUMERUS 04/01/2010   PCP:  Pcp, No Pharmacy:   [id number] - [place], [place] - [address] [place] [address] Phone: [phone] Fax: [phone]     Social Determinants of Health (SDOH) Social History: SDOH Screenings   Food Insecurity: No Food Insecurity (09/14/2022)  Housing: Low Risk  (09/14/2022)  Transportation Needs: No Transportation Needs (09/14/2022)  Utilities: Not At Risk (09/14/2022)  Financial Resource Strain: Low Risk  (01/14/2020)  Physical Activity: Inactive (01/14/2020)  Social Connections: Socially Isolated (01/14/2020)  Stress: No Stress Concern Present (01/14/2020)  Tobacco Use: High Risk (09/14/2022)   SDOH Interventions:     Readmission Risk Interventions    09/14/2022    2:42 PM 07/02/2022   12:14 PM 05/26/2022   12:55 PM  Readmission Risk Prevention Plan  Transportation Screening Complete Complete Complete  PCP or Specialist Appt within 5-7 Days   Complete  PCP or Specialist Appt within 3-5 Days  Complete   Home Care  Screening   Complete  Medication Review (RN CM)   Complete  HRI or Home Care Consult  Complete   Social Work Consult for Recovery Care Planning/Counseling  Complete   Palliative Care Screening  Complete   Medication Review [employer]) Complete Complete   HRI or Home Care Consult Complete    SW Recovery Care/Counseling Consult Complete    Palliative Care Screening Complete    Skilled Nursing Facility Not Applicable

## 2022-09-14 NOTE — Evaluation (Signed)
Physical Therapy Evaluation Patient Details Name: Sandra Horne MRN: [id number] DOB: 11/23/1955 Today's Date: 09/14/2022  History of Present Illness  Sandra Horne is a 67 y.o. female with history of lung cancer, mets to brain has had radiation but no recent treatments. Not currently getting chemo. Has remained full code and reports overall she has been doing well. For the last several weeks she has been having pain and weakness in both hands and legs, R>L. Has been taking pain medications to help with her symptoms but has run out. Her husband at bedside reports he is now having to help her with ADLs including getting dressed and getting around the house.   Clinical Impression  Patient demonstrates fair/good return for sitting up at bedside requiring increased time, unsteady on feet requiring occasional verbal/tactile cueing for safety to avoid loss of balance, able to ambulate in room without loss of balance and limited mostly due to fatigue and c/o LLE pain. Patient tolerated sitting up in chair with her spouse present after therapy.  Patient will benefit from continued skilled physical therapy in hospital and recommended venue below to increase strength, balance, endurance for safe ADLs and gait.       Recommendations for follow up therapy are one component of a multi-disciplinary discharge planning process, led by the attending physician.  Recommendations may be updated based on patient status, additional functional criteria and insurance authorization.  Follow Up Recommendations       Assistance Recommended at Discharge Set up Supervision/Assistance  Patient can return home with the following  A little help with walking and/or transfers;A little help with bathing/dressing/bathroom;Help with stairs or ramp for entrance;Assistance with cooking/housework    Equipment Recommendations None recommended by PT  Recommendations for Other Services       Functional Status Assessment  Patient has had a recent decline in their functional status and demonstrates the ability to make significant improvements in function in a reasonable and predictable amount of time.     Precautions / Restrictions Precautions Precautions: Fall Restrictions Weight Bearing Restrictions: No      Mobility  Bed Mobility Overal bed mobility: Needs Assistance Bed Mobility: Supine to Sit     Supine to sit: Supervision     General bed mobility comments: increased time with labored movement    Transfers Overall transfer level: Needs assistance Equipment used: Rolling walker (2 wheels) Transfers: Sit to/from Stand, Bed to chair/wheelchair/BSC Sit to Stand: Min guard, Min assist   Step pivot transfers: Min guard, Min assist       General transfer comment: slow labored movement    Ambulation/Gait Ambulation/Gait assistance: Min guard, Min assist Gait Distance (Feet): 30 Feet Assistive device: Rolling walker (2 wheels) Gait Pattern/deviations: Decreased step length - right, Decreased step length - left, Decreased stride length, Trunk flexed Gait velocity: decreased     General Gait Details: slow labored unsteady cadence without loss of balance, limited mostly due to c/o fatigue  Stairs            Wheelchair Mobility    Modified Rankin (Stroke Patients Only)       Balance Overall balance assessment: Needs assistance Sitting-balance support: Feet supported, No upper extremity supported Sitting balance-Leahy Scale: Fair Sitting balance - Comments: fair/good seated at EOB   Standing balance support: During functional activity, Bilateral upper extremity supported Standing balance-Leahy Scale: Fair Standing balance comment: using RW  Pertinent Vitals/Pain Pain Assessment Pain Assessment: Faces Faces Pain Scale: Hurts even more Pain Location: LLE, low back Pain Descriptors / Indicators: Sore, Discomfort, Grimacing Pain  Intervention(s): Limited activity within patient's tolerance, Monitored during session, Repositioned, Patient requesting pain meds-RN notified    Home Living Family/patient expects to be discharged to:: Private residence Living Arrangements: Spouse/significant other Available Help at Discharge: Family Type of Home: House Home Access: Level entry       Home Layout: One level Home Equipment: Agricultural consultant (2 wheels);BSC/3in1;Wheelchair - manual      Prior Function Prior Level of Function : Needs assist       Physical Assist : Mobility (physical);ADLs (physical) Mobility (physical): Bed mobility;Transfers;Gait;Stairs   Mobility Comments: household ambulation using RW ADLs Comments: Assisted by family     Hand Dominance   Dominant Hand: Right    Extremity/Trunk Assessment   Upper Extremity Assessment Upper Extremity Assessment: Generalized weakness    Lower Extremity Assessment Lower Extremity Assessment: Generalized weakness    Cervical / Trunk Assessment Cervical / Trunk Assessment: Kyphotic  Communication   Communication: No difficulties  Cognition Arousal/Alertness: Awake/alert Behavior During Therapy: WFL for tasks assessed/performed Overall Cognitive Status: Within Functional Limits for tasks assessed                                          General Comments      Exercises     Assessment/Plan    PT Assessment Patient needs continued PT services  PT Problem List Decreased strength;Decreased activity tolerance;Decreased balance;Decreased mobility       PT Treatment Interventions DME instruction;Gait training;Stair training;Functional mobility training;Therapeutic activities;Therapeutic exercise;Patient/family education;Balance training    PT Goals (Current goals can be found in the Care Plan section)  Acute Rehab PT Goals Patient Stated Goal: return home with family to assist PT Goal Formulation: With patient/family Time For Goal  Achievement: 09/21/22 Potential to Achieve Goals: Good    Frequency Min 3X/week     Co-evaluation               AM-PAC PT "6 Clicks" Mobility  Outcome Measure Help needed turning from your back to your side while in a flat bed without using bedrails?: None Help needed moving from lying on your back to sitting on the side of a flat bed without using bedrails?: None Help needed moving to and from a bed to a chair (including a wheelchair)?: A Little Help needed standing up from a chair using your arms (e.g., wheelchair or bedside chair)?: A Little Help needed to walk in hospital room?: A Little Help needed climbing 3-5 steps with a railing? : A Lot 6 Click Score: 19    End of Session   Activity Tolerance: Patient tolerated treatment well;Patient limited by fatigue Patient left: in chair;with call bell/phone within reach;with family/visitor present Nurse Communication: Mobility status PT Visit Diagnosis: Unsteadiness on feet (R26.81);Other abnormalities of gait and mobility (R26.89);Muscle weakness (generalized) (M62.81)    Time: 1610-9604 PT Time Calculation (min) (ACUTE ONLY): 31 min   Charges:   PT Evaluation $PT Eval Moderate Complexity: 1 Mod PT Treatments $Therapeutic Activity: 23-37 mins        1:57 PM, 09/14/22 Ocie Bob, MPT Physical Therapist with [facility] [phone] office [phone] mobile phone

## 2022-09-14 NOTE — Plan of Care (Signed)
  Problem: Acute Rehab PT Goals(only PT should resolve) Goal: Pt Will Go Supine/Side To Sit Outcome: Progressing Flowsheets (Taken 09/14/2022 1359) Pt will go Supine/Side to Sit:  Independently  with modified independence Goal: Patient Will Transfer Sit To/From Stand Outcome: Progressing Flowsheets (Taken 09/14/2022 1359) Patient will transfer sit to/from stand:  with supervision  with modified independence Goal: Pt Will Transfer Bed To Chair/Chair To Bed Outcome: Progressing Flowsheets (Taken 09/14/2022 1359) Pt will Transfer Bed to Chair/Chair to Bed:  with supervision  with modified independence Goal: Pt Will Ambulate Outcome: Progressing Flowsheets (Taken 09/14/2022 1359) Pt will Ambulate:  50 feet  with supervision  with min guard assist  with rolling walker   1:59 PM, 09/14/22 Ocie Bob, MPT Physical Therapist with [facility] [phone] office [phone] mobile phone

## 2022-09-14 NOTE — H&P (Signed)
Patient Demographics:    Sandra Horne, is a 67 y.o. 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 Similar to slight increase in size of the dominant left  posterior frontal lobe cystic lesion. Unchanged left occipital cystic lesion, otherwise no new acute intracranial abnormality. -Discussed with oncologist Dr. Ellin Saba -Patient previously refused systemic chemotherapy--she was treated with single agent immunotherapy previously and previously had radiation to left hilar mass as well as whole brain radiation -Unfortunately patient has been getting much weaker over the last 3 weeks requiring more more assistance with ADLs -Family reports worsening generalized weakness and failure to thrive in adult in the setting of cancer of the lung with metastasis to the brain Since last several weeks she has been having pain and weakness in both hands and legs, R>L. She has been taking pain medications to help with her symptoms but has run out.  Patient now requires assistance in doing ADLs including getting dressed and getting around the house. -Patient experiencing increased pain and weakness she ran out of her pain medications recently -Palliative care consult requested--- consult pending  2) social/ethics----palliative care consult requested to further delineate goals of care and advanced directives  Dispo: The patient is from: Home              Anticipated d/c is to: Home              Anticipated d/c date is: 1 day              Patient currently  is not medically stable to d/c. Barriers: Not Clinically Stable-    With History of - Reviewed by me  Past Medical History:  Diagnosis Date   Cancer (HCC)    Lung and Brain mets   Lung cancer [facility])       Past Surgical History:  Procedure Laterality Date   BRONCHIAL BRUSHINGS  03/26/2021   Procedure: BRONCHIAL BRUSHINGS;  Surgeon: Luciano Cutter, MD;  Location: [facility];  Service: Cardiopulmonary;;   BRONCHIAL NEEDLE ASPIRATION BIOPSY  03/26/2021   Procedure: BRONCHIAL NEEDLE ASPIRATION BIOPSIES;  Surgeon: Luciano Cutter, MD;  Location: [facility];  Service: Cardiopulmonary;;   BRONCHIAL WASHINGS  03/26/2021   Procedure: BRONCHIAL WASHINGS;  Surgeon: Luciano Cutter, MD;  Location: [facility];  Service: Cardiopulmonary;;   ECTOPIC PREGNANCY SURGERY     ENDOBRONCHIAL ULTRASOUND Bilateral 03/26/2021   Procedure: ENDOBRONCHIAL ULTRASOUND;  Surgeon: Luciano Cutter, MD;  Location: [facility];  Service: Cardiopulmonary;  Laterality: Bilateral;   HEMOSTASIS CONTROL  03/26/2021   Procedure: HEMOSTASIS CONTROL;  Surgeon: Luciano Cutter, MD;  Location: [facility];  Service: Cardiopulmonary;;   HIP ARTHROPLASTY  Right 10/13/2021   Procedure: ARTHROPLASTY BIPOLAR HIP (HEMIARTHROPLASTY);  Surgeon: Vickki Hearing, MD;  Location: [facility];  Service: Orthopedics;  Laterality: Right;   Chief Complaint  Patient presents with   Weakness     HPI:    Sandra Horne  is a 67 y.o. female with past medical history relevant for history of lung cancer, mets to brain has had radiation but no recent treatments. Since last several weeks she has been having pain and weakness in both hands and legs, R>L. She has been taking pain medications to help with her symptoms but has run out.  Patient now requires assistance in doing ADLs including getting dressed and getting around the house.  --Family reports worsening generalized weakness and failure to thrive in adult in the setting of  cancer of the lung with metastasis to the brain - Discussed with oncologist Dr. Ellin Saba -Patient previously refused systemic chemotherapy--she was treated with single agent immunotherapy previously and previously had radiation to left hilar mass as well as whole brain radiation -Unfortunately patient has been getting much weaker over the last 3 weeks requiring more more assistance with ADLs -Palliative care consult requested--- please see consult note -WBC is 5.6 hemoglobin is 11.3 with platelets 353 -UA is not consistent with a UTI -BMP WNL -CT head shows-Diffuse left cerebral vasogenic edema and mass effect with 7 mm of left-to-right midline shift. Similar to slight increase in size of the dominant left posterior frontal lobe cystic lesion. Unchanged left occipital cystic lesion, otherwise no new acute intracranial abnormality. -Additional history obtained from patient's husband at bedside --Patient experiencing increased pain and weakness she ran out of her pain medications recently   Review of systems:    In addition to the HPI above,   A full Review of  Systems was done, all other systems reviewed are negative except as noted above in HPI , .    Social History:  Reviewed by me    Social History   Tobacco Use   Smoking status: Some Days    Years: 40    Types: Cigarettes   Smokeless tobacco: Never   Tobacco comments:    3 cigarettes daily   Substance Use Topics   Alcohol use: Not Currently    Comment: drinks beer rarely     Family History :  Reviewed by me    Family History  Problem Relation Age of Onset   Hypertension Mother    Stroke Mother    Hypertension Father    Cancer Sister    Cancer Paternal Uncle      Home Medications:   Prior to Admission medications   Medication Sig Start Date End Date Taking? Authorizing Provider  acetaminophen (TYLENOL) 325 MG tablet Take 2 tablets (650 mg total) by mouth every 6 (six) hours as needed for mild pain or  moderate pain (or Fever >/= 101). 07/02/22  Yes Hongalgi, Maximino Greenland, MD  oxyCODONE 10 MG TABS Take 0.5 tablets (5 mg total) by mouth every 6 (six) hours as needed for severe pain. 08/04/22  Yes Pickenpack-Cousar, Arty Baumgartner, NP  oxyCODONE ER (XTAMPZA ER) 9 MG C12A Take 9 mg by mouth every 12 (twelve) hours. 08/04/22  Yes Pickenpack-Cousar, Arty Baumgartner, NP  polyethylene glycol (MIRALAX / GLYCOLAX) 17 g packet Take 17 g by mouth daily. Patient taking differently: Take 17 g by mouth daily as needed for mild constipation. 05/12/22  Yes Adhikari, Willia Craze, MD  guaiFENesin-dextromethorphan (ROBITUSSIN DM) 100-10 MG/5ML syrup Take 5 mLs by  mouth every 4 (four) hours as needed for cough. Patient not taking: Reported on 07/09/2022 07/02/22   Elease Etienne, MD  levETIRAcetam (KEPPRA) 500 MG tablet Take 1 tablet (500 mg total) by mouth 2 (two) times daily. Patient not taking: Reported on 07/09/2022 07/02/22   Elease Etienne, MD  megestrol (MEGACE) 40 MG tablet Take 1 tablet (40 mg total) by mouth daily. Patient not taking: Reported on 09/14/2022 07/09/22   Jacalyn Lefevre, MD  nicotine (NICODERM CQ - DOSED IN MG/24 HOURS) 14 mg/24hr patch Place 1 patch (14 mg total) onto the skin daily as needed (nicotine craving). Patient not taking: Reported on 07/09/2022 07/02/22   Elease Etienne, MD     Allergies:    No Known Allergies   Physical Exam:   Vitals  Blood pressure 102/67, pulse 81, temperature 97.8 F (36.6 C), temperature source Oral, resp. rate 16, height 5\' 5"  (1.651 m), weight 48 kg, SpO2 100 %.  Physical Examination: General appearance - alert,  in no distress  Mental status - alert, oriented to person, place, and time, (intermittent episodes of confusion and disorientation noted) Eyes - sclera anicteric Neck - supple, no JVD elevation , Chest - clear  to auscultation bilaterally, symmetrical air movement,  Heart - S1 and S2 normal, regular  Abdomen - soft, nontender, nondistended, +BS Neurological -  screening mental status exam normal, neck supple without rigidity, cranial nerves II through XII intact, DTR's normal and symmetric Extremities - no pedal edema noted, intact peripheral pulses  Skin - warm, dry   Data Review:    CBC Recent Labs  Lab 09/13/22 2048  WBC 5.6  HGB 11.3*  HCT 35.4*  PLT 353  MCV 88.3  MCH 28.2  MCHC 31.9  RDW 17.6*   ------------------------------------------------------------------------------------------------------------------  Chemistries  Recent Labs  Lab 09/13/22 2048  NA 138  K 3.5  CL 106  CO2 23  GLUCOSE 96  BUN 11  CREATININE 0.58  CALCIUM 9.5   ------------------------------------------------------------------------------------------------------------------ estimated creatinine clearance is 53.1 mL/min (by C-G formula based on SCr of 0.58 mg/dL). ------------------------------------------------------------------------------------------------------------------ -----------------------------------------------------------------------------------------  Urinalysis    Component Value Date/Time   COLORURINE YELLOW 09/14/2022 0518   APPEARANCEUR HAZY (A) 09/14/2022 0518   LABSPEC 1.014 09/14/2022 0518   PHURINE 5.0 09/14/2022 0518   GLUCOSEU NEGATIVE 09/14/2022 0518   HGBUR NEGATIVE 09/14/2022 0518   BILIRUBINUR NEGATIVE 09/14/2022 0518   KETONESUR 5 (A) 09/14/2022 0518   PROTEINUR 30 (A) 09/14/2022 0518   NITRITE NEGATIVE 09/14/2022 0518   LEUKOCYTESUR NEGATIVE 09/14/2022 0518    Imaging Results:    CT Head Wo Contrast  Result Date: 09/14/2022 CLINICAL DATA:  Weakness in bilateral legs and hands. Chemo and radiation for brain cancer. EXAM: CT HEAD WITHOUT CONTRAST TECHNIQUE: Contiguous axial images were obtained from the base of the skull through the vertex without intravenous contrast. RADIATION DOSE REDUCTION: This exam was performed according to the departmental dose-optimization program which includes automated exposure  control, adjustment of the mA and/or kV according to patient size and/or use of iterative reconstruction technique. COMPARISON:  CT head 07/09/2022 FINDINGS: Brain: Similar to slight increase in size of the fluid attenuation lesion in the posterior left frontal lobe (6/12) now measuring 16 x 14 mm in the axial plane, previously 15 x 13 mm. Unchanged 7 mm fluid attenuation lesion in the left occipital lobe (6/21). Diffuse left cerebral vasogenic edema and associated mass effect is unchanged. Midline shift again measures 7 mm from left to right. No  evidence of acute infarct. No intracranial hemorrhage. Stable ventricular caliber. Vascular: No hyperdense vessel or unexpected calcification. Skull: No acute fracture or destructive osseous lesion. Sinuses/Orbits: No acute finding. Other: None. IMPRESSION: 1. Minimal if any change from 07/09/2022. Diffuse left cerebral vasogenic edema and mass effect with 7 mm of left-to-right midline shift. Similar to slight increase in size of the dominant left posterior frontal lobe cystic lesion. Unchanged left occipital cystic lesion. 2. No evidence of acute intracranial abnormality. Electronically Signed   By: Minerva Fester M.D.   On: 09/14/2022 03:58    Radiological Exams on Admission: CT Head Wo Contrast  Result Date: 09/14/2022 CLINICAL DATA:  Weakness in bilateral legs and hands. Chemo and radiation for brain cancer. EXAM: CT HEAD WITHOUT CONTRAST TECHNIQUE: Contiguous axial images were obtained from the base of the skull through the vertex without intravenous contrast. RADIATION DOSE REDUCTION: This exam was performed according to the departmental dose-optimization program which includes automated exposure control, adjustment of the mA and/or kV according to patient size and/or use of iterative reconstruction technique. COMPARISON:  CT head 07/09/2022 FINDINGS: Brain: Similar to slight increase in size of the fluid attenuation lesion in the posterior left frontal lobe  (6/12) now measuring 16 x 14 mm in the axial plane, previously 15 x 13 mm. Unchanged 7 mm fluid attenuation lesion in the left occipital lobe (6/21). Diffuse left cerebral vasogenic edema and associated mass effect is unchanged. Midline shift again measures 7 mm from left to right. No evidence of acute infarct. No intracranial hemorrhage. Stable ventricular caliber. Vascular: No hyperdense vessel or unexpected calcification. Skull: No acute fracture or destructive osseous lesion. Sinuses/Orbits: No acute finding. Other: None. IMPRESSION: 1. Minimal if any change from 07/09/2022. Diffuse left cerebral vasogenic edema and mass effect with 7 mm of left-to-right midline shift. Similar to slight increase in size of the dominant left posterior frontal lobe cystic lesion. Unchanged left occipital cystic lesion. 2. No evidence of acute intracranial abnormality. Electronically Signed   By: Minerva Fester M.D.   On: 09/14/2022 03:58    DVT Prophylaxis -SCD   AM Labs Ordered, also please review Full Orders  Family Communication: Admission, patients condition and plan of care including tests being ordered have been discussed with the patient and husband who indicate understanding and agree with the plan   Condition   stable  Shon Hale M.D on 09/14/2022 at 4:50 PM Go to [email or web address] -  for contact info  [facility] - Office  [phone]

## 2022-09-14 NOTE — ED Provider Notes (Signed)
[place] EMERGENCY DEPARTMENT AT [facility]  Provider Note  CSN: [id number] Arrival date & time: 09/13/22 1943  History Chief Complaint  Patient presents with   Weakness    Sandra Horne is a 67 y.o. female with history of lung cancer, mets to brain has had radiation but no recent treatments. Not currently getting chemo. Has remained full code and reports overall she has been doing well. For the last several weeks she has been having pain and weakness in both hands and legs, R>L. Has been taking pain medications to help with her symptoms but has run out. Her husband at bedside reports he is now having to help her with ADLs including getting dressed and getting around the house.    Home Medications Prior to Admission medications   Medication Sig Start Date End Date Taking? Authorizing Provider  acetaminophen (TYLENOL) 325 MG tablet Take 2 tablets (650 mg total) by mouth every 6 (six) hours as needed for mild pain or moderate pain (or Fever >/= 101). 07/02/22   Hongalgi, Maximino Greenland, MD  guaiFENesin-dextromethorphan (ROBITUSSIN DM) 100-10 MG/5ML syrup Take 5 mLs by mouth every 4 (four) hours as needed for cough. Patient not taking: Reported on 07/09/2022 07/02/22   Elease Etienne, MD  levETIRAcetam (KEPPRA) 500 MG tablet Take 1 tablet (500 mg total) by mouth 2 (two) times daily. Patient not taking: Reported on 07/09/2022 07/02/22   Elease Etienne, MD  megestrol (MEGACE) 40 MG tablet Take 1 tablet (40 mg total) by mouth daily. 07/09/22   Jacalyn Lefevre, MD  nicotine (NICODERM CQ - DOSED IN MG/24 HOURS) 14 mg/24hr patch Place 1 patch (14 mg total) onto the skin daily as needed (nicotine craving). Patient not taking: Reported on 07/09/2022 07/02/22   Elease Etienne, MD  oxyCODONE 10 MG TABS Take 0.5 tablets (5 mg total) by mouth every 6 (six) hours as needed for severe pain. 08/04/22   Pickenpack-Cousar, Arty Baumgartner, NP  oxyCODONE ER (XTAMPZA ER) 9 MG C12A Take 9 mg by mouth every 12  (twelve) hours. 08/04/22   Pickenpack-Cousar, Arty Baumgartner, NP  polyethylene glycol (MIRALAX / GLYCOLAX) 17 g packet Take 17 g by mouth daily. 05/12/22   Burnadette Pop, MD     Allergies    Patient has no known allergies.   Review of Systems   Review of Systems Please see HPI for pertinent positives and negatives  Physical Exam BP 124/74 (BP Location: Right Arm)   Pulse 70   Temp 98.3 F (36.8 C) (Oral)   Resp 17   Ht 5\' 6"  (1.676 m)   Wt 54.4 kg   SpO2 100%   BMI 19.37 kg/m   Physical Exam Vitals and nursing note reviewed.  Constitutional:      Appearance: Normal appearance.  HENT:     Head: Normocephalic and atraumatic.     Nose: Nose normal.     Mouth/Throat:     Mouth: Mucous membranes are moist.  Eyes:     Extraocular Movements: Extraocular movements intact.     Conjunctiva/sclera: Conjunctivae normal.  Cardiovascular:     Rate and Rhythm: Normal rate.  Pulmonary:     Effort: Pulmonary effort is normal.     Breath sounds: Normal breath sounds.  Abdominal:     General: Abdomen is flat.     Palpations: Abdomen is soft.     Tenderness: There is no abdominal tenderness.  Musculoskeletal:        General: No swelling.  Normal range of motion.     Cervical back: Neck supple.     Right lower leg: No edema.     Left lower leg: No edema.  Skin:    General: Skin is warm and dry.  Neurological:     General: No focal deficit present.     Mental Status: She is alert.     Cranial Nerves: No cranial nerve deficit.     Sensory: No sensory deficit.     Motor: Weakness (RUE and RLE) present.  Psychiatric:        Mood and Affect: Mood normal.     ED Results / Procedures / Treatments   EKG EKG Interpretation  Date/Time:  Monday September 13 2022 20:06:19 EDT Ventricular Rate:  90 PR Interval:  166 QRS Duration: 78 QT Interval:  356 QTC Calculation: 435 R Axis:   84 Text Interpretation: Normal sinus rhythm Right atrial enlargement Borderline ECG When compared with ECG of  09-Jul-2022 10:51, PREVIOUS ECG IS PRESENT No significant change since last tracing Confirmed by Susy Frizzle [phone]) on 09/14/2022 3:22:42 AM  Procedures Procedures  Medications Ordered in the ED Medications  dexamethasone (DECADRON) injection 10 mg (10 mg Intravenous Given 09/14/22 6045)    Initial Impression and Plan  Patient here for pain and weakness in all four extremities but seems to be worse on the right. She has known brain mets, has not had CT imaging since April. No MRI since January. She does not routinely follow up with Oncology. Labs done in triage show CBC with mild anemia, BMP is normal. Will check CT head.   ED Course   Clinical Course as of 09/14/22 4098  Tue Sep 14, 2022  0601 I personally viewed the images from radiology studies and agree with radiologist interpretation: CT similar in appearance to previous done in April when she was admitted. She does not appear to have had any more radiation or chemo since April. I spoke with Dr. Myna Hidalgo, Oncology, who recommends she be admitted here for steroids and consultation with local oncology.  [CS]  [phone] Spoke with Dr. Thomes Dinning, Hospitalist, who will evaluate for admission.  [CS]    Clinical Course User Index [CS] Pollyann Savoy, MD     MDM Rules/Calculators/A&P Medical Decision Making Problems Addressed: Lung cancer metastatic to brain [facility]): chronic illness or injury with exacerbation, progression, or side effects of treatment  Amount and/or Complexity of Data Reviewed Labs: ordered. Decision-making details documented in ED Course. Radiology: ordered and independent interpretation performed. Decision-making details documented in ED Course.  Risk Prescription drug management. Decision regarding hospitalization.     Final Clinical Impression(s) / ED Diagnoses Final diagnoses:  Lung cancer metastatic to brain [facility])    Rx / DC Orders ED Discharge Orders     None        Pollyann Savoy,  MD 09/14/22 [phone]

## 2022-09-14 NOTE — Consult Note (Addendum)
Consultation Note Date: 09/14/2022   Patient Name: Sandra Horne  DOB: [birth year]  MRN: [id number]  Age / Sex: 67 y.o., female  PCP: Pcp, No Referring Physician: Shon Hale, MD  Reason for Consultation:  goals of care  HPI/Patient Profile: 67 y.o. female  with past medical history of lung cancer metastatic to brain- she declined all systemic treatments- no radiation, no chemotherapy, admitted on 09/13/2022 with progressing weakness. CT scan head showed increase in brain mass.    Primary Decision Maker PATIENT  Discussion: Chart reviewed including labs, progress notes, imaging from this and previous encounters.  Reviewed last note by Lowella Bandy, NP Palliative at the Cancer center. At that time patient expressed understanding of her disease- that it will progress to end of life, and agreed to DNR status.  I met with patient and her spouse, Shon Hale.  Patient's pain is worse in her L leg. It wakes her up at night. When she had her pain medication it was working, but she ran out. She also has had decreasing appetite and increasing weakness.  We discussed that her decreased appetite, and weakness are most likely related to her cancer that is progressing.  We discussed her goals for coming to the hospital.  She has declined all treatments offered for her cancer. She notes that if she heals, it will be by God. She also expressed that she also accepts if she isn't healed that she will die. She first said her goal was to get her cancer better. I reflected to her that if her cancer gets better it will be by God- that the hospital has offered all the interventions we have to treat her cancer. She agreed with this.  She then said she just wanted to not be in pain, and to have her bowels move.  She has not had a BM in three days.  We discussed adjustments to her medication regimen including adding Neurontin and daily  dexamethasone. We also discussed Hospice services and philosophy of care. She and her spouse are in agreement with having hospice help in their home.  Shon Hale shared his experiences with hospice in the past that were helpful to him.  Code status was discussed- she was previously DNR- she does not want CPR, when she dies she desires a natural death.    SUMMARY OF RECOMMENDATIONS -DNR- comfort measures -Dexamethasone 2mg  po daily starting tomorrow -Gabapentin 100mg  BID po daily -Acetaminophen 1000 mg TID po daily -Appreciate TOC assistance in arranging hospice services at home    Addendum- contacted by RN that patient did not want DNR order- I visited with patient and reviewed our discussion. She stated she wanted a natural death. I reviewed with her that the bracelet meant that if she died we would not do CPR and make attempts to bring her back from having died. We would let her go see God and she agreed that this is what she wants.   Addendum- contacted by [facility]o that patient stated she did not want hospice. I revisited with  patient. She stated that she did not want hospice until she had her "paperwork for her children" completed- indicating her will. I reviewed hospice services with her again. Also again reviewed that hospice would come to her home only as she needed and it wouldn't change anything in her current plan of care, and it wouldn't hasten her death. She did not recall that I had discussed this with her previously as well. We also discussed that hospice would be helpful in keeping her pain medications filled so she wouldn't need to come back to the hospital which is her primary goal of care. She was tearful at one point of our discussion- remembering her sister who also died of cancer, and processing her own mortality. At close of our discussion she stated she felt much better and she does desire to have hospice assistance.   Due to patient's apparent cognitive deficits and interval  confusion, recommend communicating with spouse to help set up hospice services.    Code Status/Advance Care Planning: DNR   Prognosis:   < 6 months  Discharge Planning: Home with Hospice  Primary Diagnoses: Present on Admission: **None**   Review of Systems  Constitutional:  Positive for appetite change.  Neurological:  Positive for weakness.    Physical Exam Vitals and nursing note reviewed.  Constitutional:      Comments: cachetic  Psychiatric:     Comments: Memory deficit     Vital Signs: BP 104/65 (BP Location: Right Arm)   Pulse 73   Temp 98.3 F (36.8 C) (Oral)   Resp 18   Ht 5\' 5"  (1.651 m)   Wt 48 kg   SpO2 100%   BMI 17.61 kg/m  Pain Scale: 0-10   Pain Score: 0-No pain   SpO2: SpO2: 100 % O2 Device:SpO2: 100 % O2 Flow Rate: .   IO: Intake/output summary: No intake or output data in the 24 hours ending 09/14/22 1122  LBM: Last BM Date : 09/11/22 Baseline Weight: Weight: 54.4 kg Most recent weight: Weight: 48 kg       Thank you for this consult. Palliative medicine will continue to follow and assist as needed.   Greater than 50%  of this time was spent counseling and coordinating care related to the above assessment and plan.   Total time: 120 minutes  Signed by: Ocie Bob, AGNP-C Palliative Medicine    Please contact Palliative Medicine Team phone at [phone] for questions and concerns.  For individual provider: See Loretha Stapler

## 2022-09-28 ENCOUNTER — Emergency Department (HOSPITAL_COMMUNITY)
Admission: EM | Admit: 2022-09-28 | Discharge: 2022-09-29 | Disposition: A | Discharge status: Home / Self Care | Attending: Emergency Medicine | Admitting: Emergency Medicine

## 2022-09-28 ENCOUNTER — Encounter (HOSPITAL_COMMUNITY): Payer: Self-pay | Admitting: *Deleted

## 2022-09-28 ENCOUNTER — Other Ambulatory Visit: Payer: Self-pay

## 2022-09-28 ENCOUNTER — Emergency Department (HOSPITAL_COMMUNITY)

## 2022-09-28 DIAGNOSIS — R200 Anesthesia of skin: Secondary | ICD-10-CM | POA: Diagnosis present

## 2022-09-28 DIAGNOSIS — Z85118 Personal history of other malignant neoplasm of bronchus and lung: Secondary | ICD-10-CM | POA: Insufficient documentation

## 2022-09-28 DIAGNOSIS — C7931 Secondary malignant neoplasm of brain: Secondary | ICD-10-CM | POA: Diagnosis not present

## 2022-09-28 DIAGNOSIS — F17210 Nicotine dependence, cigarettes, uncomplicated: Secondary | ICD-10-CM | POA: Insufficient documentation

## 2022-09-28 DIAGNOSIS — R531 Weakness: Secondary | ICD-10-CM | POA: Insufficient documentation

## 2022-09-28 DIAGNOSIS — C719 Malignant neoplasm of brain, unspecified: Secondary | ICD-10-CM | POA: Diagnosis not present

## 2022-09-28 DIAGNOSIS — R9431 Abnormal electrocardiogram [ECG] [EKG]: Secondary | ICD-10-CM | POA: Diagnosis not present

## 2022-09-28 DIAGNOSIS — G936 Cerebral edema: Secondary | ICD-10-CM | POA: Diagnosis not present

## 2022-09-28 LAB — BASIC METABOLIC PANEL
Anion gap: 11 (ref 5–15)
BUN: 12 mg/dL (ref 8–23)
CO2: 23 mmol/L (ref 22–32)
Calcium: 9.3 mg/dL (ref 8.9–10.3)
Chloride: 106 mmol/L (ref 98–111)
Creatinine, Ser: 0.6 mg/dL (ref 0.44–1.00)
GFR, Estimated: >60 mL/min (ref >60)
Glucose, Bld: 108 mg/dL — ABNORMAL HIGH (ref 70–99)
Potassium: 3.5 mmol/L (ref 3.5–5.1)
Sodium: 140 mmol/L (ref 135–145)

## 2022-09-28 LAB — CBC WITH DIFFERENTIAL/PLATELET
Abs Immature Granulocytes: 0.03 10*3/uL (ref 0.00–0.07)
Basophils Absolute: 0.1 10*3/uL (ref 0.0–0.1)
Basophils Relative: 1 %
Eosinophils Absolute: 0.2 10*3/uL (ref 0.0–0.5)
Eosinophils Relative: 3 %
HCT: 38.4 % (ref 36.0–46.0)
Hemoglobin: 12.3 g/dL (ref 12.0–15.0)
Immature Granulocytes: 1 %
Lymphocytes Relative: 21 %
Lymphs Abs: 1.4 10*3/uL (ref 0.7–4.0)
MCH: 28.2 pg (ref 26.0–34.0)
MCHC: 32 g/dL (ref 30.0–36.0)
MCV: 88.1 fL (ref 80.0–100.0)
Monocytes Absolute: 0.8 10*3/uL (ref 0.1–1.0)
Monocytes Relative: 12 %
Neutro Abs: 4.1 10*3/uL (ref 1.7–7.7)
Neutrophils Relative %: 62 %
Platelets: 397 10*3/uL (ref 150–400)
RBC: 4.36 MIL/uL (ref 3.87–5.11)
RDW: 16.5 % — ABNORMAL HIGH (ref 11.5–15.5)
WBC: 6.5 10*3/uL (ref 4.0–10.5)
nRBC: 0 % (ref 0.0–0.2)

## 2022-09-28 LAB — MAGNESIUM: Magnesium: 2 mg/dL (ref 1.7–2.4)

## 2022-09-28 MED ORDER — OXYCODONE HCL 5 MG PO TABS
5.0000 mg | ORAL_TABLET | Freq: Once | ORAL | Status: AC
  Administered 2022-09-28: 5 mg via ORAL
  Filled 2022-09-28: qty 1

## 2022-09-28 NOTE — ED Triage Notes (Signed)
Pt with bilateral leg swelling for past 2 weeks. Pt with brain CA, ongoing chemo and radiation.

## 2022-09-28 NOTE — ED Provider Notes (Signed)
[facility] DE[facility] Emergency Department Provider Note MRN:  [id number]  Arrival date & time: 09/29/22     Chief Complaint   Leg weakness History of Present Illness   Sandra Horne is a 67 y.o. year-old female with a history of brain cancer, lung cancer presenting to the ED with chief complaint of leg weakness.  Over the past 5 to 7 days patient has had a significant functional decline at home.  Not able to feed self, bathe self, not able to ambulate or transition on her own.  Complaining of increased right leg weakness and numbness.  No changes to the arms, has chronic numbness and weakness to the right arm from her brain tumor.  Having some frequent urination recently, no other complaints.  Review of Systems  A thorough review of systems was obtained and all systems are negative except as noted in the HPI and PMH.   Patient's Health History    Past Medical History:  Diagnosis Date   Cancer (HCC)    Lung and Brain mets   Lung cancer [facility])     Past Surgical History:  Procedure Laterality Date   BRONCHIAL BRUSHINGS  03/26/2021   Procedure: BRONCHIAL BRUSHINGS;  Surgeon: Luciano Cutter, MD;  Location: [facility] ENDOSCOPY;  Service: Cardiopulmonary;;   BRONCHIAL NEEDLE ASPIRATION BIOPSY  03/26/2021   Procedure: BRONCHIAL NEEDLE ASPIRATION BIOPSIES;  Surgeon: Luciano Cutter, MD;  Location: [facility];  Service: Cardiopulmonary;;   BRONCHIAL WASHINGS  03/26/2021   Procedure: BRONCHIAL WASHINGS;  Surgeon: Luciano Cutter, MD;  Location: [facility];  Service: Cardiopulmonary;;   ECTOPIC PREGNANCY SURGERY     ENDOBRONCHIAL ULTRASOUND Bilateral 03/26/2021   Procedure: ENDOBRONCHIAL ULTRASOUND;  Surgeon: Luciano Cutter, MD;  Location: [facility] ENDOSCOPY;  Service: Cardiopulmonary;  Laterality: Bilateral;   HEMOSTASIS CONTROL  03/26/2021   Procedure: HEMOSTASIS CONTROL;  Surgeon: Luciano Cutter, MD;  Location: [facility] ENDOS[facility];  Service: Cardiopulmonary;;   HIP  ARTHROPLASTY Right 10/13/2021   Procedure: ARTHROPLASTY BIPOLAR HIP (HEMIARTHROPLASTY);  Surgeon: Vickki Hearing, MD;  Location: [facility] ORS;  Service: Orthopedics;  Laterality: Right;    Family History  Problem Relation Age of Onset   Hypertension Mother    Stroke Mother    Hypertension Father    Cancer Sister    Cancer Paternal Uncle     Social History   Socioeconomic History   Marital status: Married    Spouse name: Not on file   Number of children: Not on file   Years of education: Not on file   Highest education level: Not on file  Occupational History   Not on file  Tobacco Use   Smoking status: Some Days    Years: 40    Types: Cigarettes   Smokeless tobacco: Never   Tobacco comments:    3 cigarettes daily   Vaping Use   Vaping Use: Never used  Substance and Sexual Activity   Alcohol use: Not Currently    Comment: drinks beer rarely    Drug use: Not Currently    Types: Marijuana    Comment: last marijuana use mid 11/19/2019   Sexual activity: Yes    Birth control/protection: None  Other Topics Concern   Not on file  Social History Narrative   Not on file   Social Determinants of Health   Financial Resource Strain: Low Risk  (01/14/2020)   Overall Financial Resource Strain (CARDIA)    Difficulty of Paying Living Expenses: Not hard at all  Food  Insecurity: No Food Insecurity (09/14/2022)   Hunger Vital Sign    Worried About Running Out of Food in the Last Year: Never true    Ran Out of Food in the Last Year: Never true  Transportation Needs: No Transportation Needs (09/14/2022)   PRAPARE - Administrator, Civil Service (Medical): No    Lack of Transportation (Non-Medical): No  Physical Activity: Inactive (01/14/2020)   Exercise Vital Sign    Days of Exercise per Week: 0 days    Minutes of Exercise per Session: 0 min  Stress: No Stress Concern Present (01/14/2020)   [employer] of Occupational Health - Occupational Stress Questionnaire     Feeling of Stress : Not at all  Social Connections: Socially Isolated (01/14/2020)   Social Connection and Isolation Panel [NHANES]    Frequency of Communication with Friends and Family: More than three times a week    Frequency of Social Gatherings with Friends and Family: Never    Attends Religious Services: Never    Database administrator or Organizations: No    Attends Banker Meetings: Never    Marital Status: Widowed  Intimate Partner Violence: Not At Risk (09/14/2022)   Humiliation, Afraid, Rape, and Kick questionnaire    Fear of Current or Ex-Partner: No    Emotionally Abused: No    Physically Abused: No    Sexually Abused: No     Physical Exam   Vitals:   09/29/22 0000 09/29/22 0030  BP: 127/72 119/73  Pulse: 75 71  Resp: 20 19  Temp:    SpO2: 99% 99%    CONSTITUTIONAL: Chronically ill-appearing, NAD NEURO/PSYCH:  Alert and oriented x 3, decreased strength and sensation to her right upper and lower extremity EYES:  eyes equal and reactive ENT/NECK:  no LAD, no JVD CARDIO: Regular rate, well-perfused, normal S1 and S2 PULM:  CTAB no wheezing or rhonchi GI/GU:  non-distended, non-tender MSK/SPINE:  No gross deformities, no edema SKIN:  no rash, atraumatic   *Additional and/or pertinent findings included in MDM below  Diagnostic and Interventional Summary    EKG Interpretation Date/Time:  Wednesday September 29 2022 00:13:46 EDT Ventricular Rate:  70 PR Interval:  194 QRS Duration:  96 QT Interval:  404 QTC Calculation: 436 R Axis:   79  Text Interpretation: Sinus rhythm Confirmed by Kennis Carina [phone]) on 09/29/2022 12:30:16 AM       Labs Reviewed  CBC WITH DIFFERENTIAL/PLATELET - Abnormal; Notable for the following components:      Result Value   RDW 16.5 (*)    All other components within normal limits  BASIC METABOLIC PANEL - Abnormal; Notable for the following components:   Glucose, Bld 108 (*)    All other components within normal  limits  URINALYSIS, ROUTINE W REFLEX MICROSCOPIC - Abnormal; Notable for the following components:   APPearance HAZY (*)    Hgb urine dipstick SMALL (*)    Ketones, ur 20 (*)    Protein, ur 30 (*)    Bacteria, UA RARE (*)    All other components within normal limits  HEPATIC FUNCTION PANEL - Abnormal; Notable for the following components:   Indirect Bilirubin 1.0 (*)    All other components within normal limits  MAGNESIUM  LIPASE, BLOOD    CT HEAD WO CONTRAST ( )  Final Result      Medications  oxyCODONE (Oxy IR/ROXICODONE) immediate release tablet 5 mg (5 mg Oral Given 09/28/22 2338)  Procedures  /  Critical Care Procedures  ED Course and Medical Decision Making  Initial Impression and Ddx Concern for increased tumor burden versus tumor hemorrhage.  Awaiting CT head.  Past medical/surgical history that increases complexity of ED encounter: Brain cancer  Interpretation of Diagnostics I personally reviewed the laboratory assessment and my interpretation is as follows: No significant blood count or electrolyte disturbance  CT scan demonstrating known tumor with vasogenic edema, increasing midline shift compared to prior  Patient Reassessment and Ultimate Disposition/Management     Imaging results and options discussed with patient and patient's husband.  Seems pretty clear from the recent hospitalization no and oncology notes patient is not interested in chemotherapy, has refused multiple times in the past.  Hospice seems to be the best plan for the patient and patient is agreeable for [facility] care, home hospice discussions with case management and social work.  Patient management required discussion with the following services or consulting groups:  Case Management/Social Work  Complexity of Problems Addressed Acute illness or injury that poses threat of life of bodily function  Additional Data Reviewed and Analyzed Further history obtained from: Further history from  spouse/family member  Additional Factors Impacting ED Encounter Risk Consideration of hospitalization  Elmer Sow. Pilar Plate, MD [facility] Emergency Medicine [facility] [email or web address]  Final Clinical Impressions(s) / ED Diagnoses     ICD-10-CM   1. Malignant neoplasm of brain, unspecified location [facility])  C71.9       ED Discharge Orders     None        Discharge Instructions Discussed with and Provided to Patient:   Discharge Instructions   None      Sabas Sous, MD 09/29/22 [phone]

## 2022-09-29 DIAGNOSIS — C719 Malignant neoplasm of brain, unspecified: Secondary | ICD-10-CM | POA: Diagnosis not present

## 2022-09-29 LAB — URINALYSIS, ROUTINE W REFLEX MICROSCOPIC
Bilirubin Urine: NEGATIVE
Glucose, UA: NEGATIVE mg/dL
Ketones, ur: 20 mg/dL — AB
Leukocytes,Ua: NEGATIVE
Nitrite: NEGATIVE
Protein, ur: 30 mg/dL — AB
Specific Gravity, Urine: 1.015 (ref 1.005–1.030)
pH: 5 (ref 5.0–8.0)

## 2022-09-29 LAB — HEPATIC FUNCTION PANEL
ALT: 15 U/L (ref 0–44)
AST: 16 U/L (ref 15–41)
Albumin: 3.6 g/dL (ref 3.5–5.0)
Alkaline Phosphatase: 68 U/L (ref 38–126)
Bilirubin, Direct: 0.2 mg/dL (ref 0.0–0.2)
Indirect Bilirubin: 1 mg/dL — ABNORMAL HIGH (ref 0.3–0.9)
Total Bilirubin: 1.2 mg/dL (ref 0.3–1.2)
Total Protein: 7.2 g/dL (ref 6.5–8.1)

## 2022-09-29 LAB — LIPASE, BLOOD: Lipase: 26 U/L (ref 11–51)

## 2022-09-29 MED ORDER — LEVETIRACETAM 500 MG PO TABS
500.0000 mg | ORAL_TABLET | Freq: Two times a day (BID) | ORAL | Status: DC
  Administered 2022-09-29: 500 mg via ORAL
  Filled 2022-09-29: qty 1

## 2022-09-29 MED ORDER — OXYCODONE HCL 5 MG PO TABS: 5.0000 mg | ORAL_TABLET | Freq: Four times a day (QID) | ORAL | Status: DC | PRN

## 2022-09-29 MED ORDER — OXYCODONE HCL ER 10 MG PO T12A
10.0000 mg | EXTENDED_RELEASE_TABLET | Freq: Two times a day (BID) | ORAL | Status: DC
  Administered 2022-09-29: 10 mg via ORAL
  Filled 2022-09-29: qty 1

## 2022-09-29 MED ORDER — GABAPENTIN 100 MG PO CAPS
100.0000 mg | ORAL_CAPSULE | Freq: Two times a day (BID) | ORAL | Status: DC
  Administered 2022-09-29: 100 mg via ORAL
  Filled 2022-09-29: qty 1

## 2022-09-29 MED ORDER — DEXAMETHASONE 4 MG PO TABS
2.0000 mg | ORAL_TABLET | Freq: Every day | ORAL | Status: DC
  Administered 2022-09-29: 2 mg via ORAL
  Filled 2022-09-29: qty 1

## 2022-09-29 MED ORDER — PANTOPRAZOLE SODIUM 40 MG PO TBEC
40.0000 mg | DELAYED_RELEASE_TABLET | Freq: Every day | ORAL | Status: DC
  Administered 2022-09-29: 40 mg via ORAL
  Filled 2022-09-29: qty 1

## 2022-09-29 MED ORDER — MIRTAZAPINE 15 MG PO TABS: 15.0000 mg | ORAL_TABLET | Freq: Every day | ORAL | Status: DC

## 2022-09-29 MED ORDER — SENNOSIDES-DOCUSATE SODIUM 8.6-50 MG PO TABS
2.0000 | ORAL_TABLET | Freq: Two times a day (BID) | ORAL | Status: DC
  Administered 2022-09-29: 2 via ORAL
  Filled 2022-09-29: qty 2

## 2022-09-29 NOTE — ED Notes (Addendum)
TOC consulted states question of pt needing home with hospice. CSW completed chart review and notes that [facility] with home hospice services through [facility]. CSW spoke to [place] with [facility] who confirms pt is active with home hospice services. Pt has a hospice nurse coming out twice weekly and a nurses aide coming out twice weekly. CSW updated MD and RN of this.   CSW met with pt and spouse at bedside. CSW explained that if more care giving services were needed this would have to be set up from the community as it is private pay. CSW explained that a CAPs application could be completed by pt and husband in order to get pt some aides in the home since she has Medicaid, CSW provided husband with number for Medicaid office. CSW explained that if it was needed placement may be able to be looked into, pt states that she is not interested in this and will be going back home. CSW inquired into any DME needed. Pt has a walker, wheelchair and shower chair. Pts husband states they need a BSC.   CSW spoke to [facility] with [facility], CSW updated that pt will D/C back home today. CSW explained pt and husbands wishes for more services in the home if possible. CSW updated that they needed a BSC, Rae Halsted will order for pt and it will be delivered to pts home. CSW also requested that hospice social worker follow up with pt and husband to assist with completing CAPs application, Rae Halsted states she will update SW and RN following pt. MD and RN updated of plan for return home with continued hospice services. TOC signing off.

## 2022-09-29 NOTE — ED Provider Notes (Signed)
Signout from Dr. Pilar Plate.  67 year old female with brain cancer lung cancer here with decreased ability to care for self and perform ADLs at home.  She is not active with any chemotherapy due to her having declined it.  Chronic numbness weakness right arm.  Plan is TOC consult and possible institution of hospice care. Physical Exam  BP 114/65   Pulse 73   Temp 98 F (36.7 C) (Oral)   Resp 16   Ht 5\' 5"  (1.651 m)   Wt 48 kg   SpO2 98%   BMI 17.61 kg/m   Physical Exam  Procedures  Procedures  ED Course / MDM    Medical Decision Making Amount and/or Complexity of Data Reviewed Labs: ordered. Radiology: ordered. ECG/medicine tests: ordered.  Risk OTC drugs. Prescription drug management.   Patient was evaluated by social work and she discussed with husband.  They already have hospice at home 2 days a week.  She has declined going inpatient hospice.  Social work will work on getting hospice services to see if they can increase her care level at home along with gave husband information for some additional services.  No indications for medical admission otherwise so will discharge to home.       [name], MD 09/29/22 [phone]

## 2022-10-04 ENCOUNTER — Inpatient Hospital Stay: Payer: 59

## 2022-10-09 ENCOUNTER — Emergency Department (HOSPITAL_COMMUNITY)

## 2022-10-09 ENCOUNTER — Other Ambulatory Visit: Payer: Self-pay

## 2022-10-09 ENCOUNTER — Emergency Department (HOSPITAL_COMMUNITY)
Admission: EM | Admit: 2022-10-09 | Discharge: 2022-10-09 | Disposition: A | Discharge status: Home / Self Care | Attending: Emergency Medicine | Admitting: Emergency Medicine

## 2022-10-09 DIAGNOSIS — W07XXXA Fall from chair, initial encounter: Secondary | ICD-10-CM | POA: Insufficient documentation

## 2022-10-09 DIAGNOSIS — Z85118 Personal history of other malignant neoplasm of bronchus and lung: Secondary | ICD-10-CM | POA: Insufficient documentation

## 2022-10-09 DIAGNOSIS — G936 Cerebral edema: Secondary | ICD-10-CM | POA: Diagnosis not present

## 2022-10-09 DIAGNOSIS — W19XXXA Unspecified fall, initial encounter: Secondary | ICD-10-CM

## 2022-10-09 DIAGNOSIS — R531 Weakness: Secondary | ICD-10-CM

## 2022-10-09 DIAGNOSIS — Z85841 Personal history of malignant neoplasm of brain: Secondary | ICD-10-CM | POA: Diagnosis not present

## 2022-10-09 DIAGNOSIS — C801 Malignant (primary) neoplasm, unspecified: Secondary | ICD-10-CM | POA: Diagnosis not present

## 2022-10-09 LAB — BASIC METABOLIC PANEL
Anion gap: 12 (ref 5–15)
BUN: 13 mg/dL (ref 8–23)
CO2: 21 mmol/L — ABNORMAL LOW (ref 22–32)
Calcium: 9 mg/dL (ref 8.9–10.3)
Chloride: 106 mmol/L (ref 98–111)
Creatinine, Ser: 0.6 mg/dL (ref 0.44–1.00)
GFR, Estimated: >60 mL/min (ref >60)
Glucose, Bld: 97 mg/dL (ref 70–99)
Potassium: 3.6 mmol/L (ref 3.5–5.1)
Sodium: 139 mmol/L (ref 135–145)

## 2022-10-09 LAB — URINALYSIS, ROUTINE W REFLEX MICROSCOPIC
Bacteria, UA: NONE SEEN
Bilirubin Urine: NEGATIVE
Glucose, UA: NEGATIVE mg/dL
Hgb urine dipstick: NEGATIVE
Ketones, ur: 20 mg/dL — AB
Leukocytes,Ua: NEGATIVE
Nitrite: NEGATIVE
Protein, ur: 100 mg/dL — AB
Specific Gravity, Urine: 1.017 (ref 1.005–1.030)
pH: 5 (ref 5.0–8.0)

## 2022-10-09 LAB — CBC
HCT: 40.7 % (ref 36.0–46.0)
Hemoglobin: 12.7 g/dL (ref 12.0–15.0)
MCH: 28.5 pg (ref 26.0–34.0)
MCHC: 31.2 g/dL (ref 30.0–36.0)
MCV: 91.5 fL (ref 80.0–100.0)
Platelets: 336 10*3/uL (ref 150–400)
RBC: 4.45 MIL/uL (ref 3.87–5.11)
RDW: 15.9 % — ABNORMAL HIGH (ref 11.5–15.5)
WBC: 5.3 10*3/uL (ref 4.0–10.5)
nRBC: 0 % (ref 0.0–0.2)

## 2022-10-09 LAB — CBG MONITORING, ED: Glucose-Capillary: 98 mg/dL (ref 70–99)

## 2022-10-09 NOTE — ED Notes (Signed)
Gave pt more water, after 15 mins per MD, resort to In & Out Cath.

## 2022-10-09 NOTE — ED Triage Notes (Signed)
Pt is a pt of [facility]ice of [address]

## 2022-10-09 NOTE — ED Triage Notes (Signed)
Pt arrived [facility] for c/o weakness and decrease in behavior, eating, gait and now incontinent. Pt fell on Tues from chair and found in the floor. Husband states pt has declined since then. Pt has brain cancer and is no longer getting treatment for past 6 months. REMS gave 200NS in 22 G in RFA.

## 2022-10-09 NOTE — ED Notes (Signed)
[facility] Placed per MD Zammit to obtain Urine Sample. Pt reports for weakness after a fall with incontinence.

## 2022-10-09 NOTE — Discharge Instructions (Signed)
Drink plenty of fluids.  Follow-up with your doctor this week for recheck.  And make sure you get in touch with hospice since you are now going to be living in a different place

## 2022-10-09 NOTE — ED Provider Notes (Signed)
[place] EMERGENCY DEPARTMENT AT [facility] Provider Note   CSN: [id number] Arrival date & time: 10/09/22  8295     History  Chief Complaint  Patient presents with   Weakness   Fall    Sandra Horne is a 67 y.o. female.  Patient with a history of lung cancer and brain mets.  She has been weak and fell.  She complains of no pain but has weakness on the right side which she has been having from her brain mets.  The history is provided by medical records and a relative. No language interpreter was used.  Weakness Severity:  Moderate Onset quality:  Gradual Timing:  Constant Progression:  Worsening Chronicity:  Recurrent Context: not alcohol use   Relieved by:  Nothing Worsened by:  Nothing Ineffective treatments:  None tried Associated symptoms: no abdominal pain, no chest pain, no cough, no diarrhea, no frequency, no headaches and no seizures   Fall Pertinent negatives include no chest pain, no abdominal pain and no headaches.       Home Medications Prior to Admission medications   Medication Sig Start Date End Date Taking? Authorizing Provider  gabapentin (NEURONTIN) 100 MG capsule Take 1 capsule (100 mg total) by mouth 2 (two) times daily. 09/14/22  Yes Emokpae, Courage, MD  levETIRAcetam (KEPPRA) 500 MG tablet Take 1 tablet (500 mg total) by mouth 2 (two) times daily. 09/14/22  Yes Emokpae, Courage, MD  pantoprazole (PROTONIX) 40 MG tablet Take 1 tablet (40 mg total) by mouth daily. 09/14/22 09/14/23 Yes Emokpae, Courage, MD  senna-docusate (SENOKOT-S) 8.6-50 MG tablet Take 2 tablets by mouth 2 (two) times daily. 09/14/22 09/14/23 Yes Emokpae, Courage, MD  acetaminophen (TYLENOL) 500 MG tablet Take 2 tablets (1,000 mg total) by mouth 3 (three) times daily. Patient not taking: Reported on 09/29/2022 09/14/22   Shon Hale, MD  bisacodyl (DULCOLAX) 10 MG suppository Place 1 suppository (10 mg total) rectally daily as needed for moderate constipation. 09/14/22    Shon Hale, MD  dexamethasone (DECADRON) 2 MG tablet Take 1 tablet (2 mg total) by mouth daily with breakfast. 09/15/22   Shon Hale, MD  mirtazapine (REMERON) 15 MG tablet Take 1 tablet (15 mg total) by mouth at bedtime. 09/14/22   Shon Hale, MD  oxyCODONE ER (XTAMPZA ER) 9 MG C12A Take 9 mg by mouth every 12 (twelve) hours. 09/14/22   Shon Hale, MD  Oxycodone HCl 10 MG TABS Take 0.5 tablets (5 mg total) by mouth every 6 (six) hours as needed (severe pain). 09/14/22   Shon Hale, MD  polyethylene glycol (MIRALAX / GLYCOLAX) 17 g packet Take 17 g by mouth 2 (two) times daily. Patient not taking: Reported on 09/29/2022 09/14/22   Shon Hale, MD      Allergies    Patient has no known allergies.    Review of Systems   Review of Systems  Constitutional:  Negative for appetite change and fatigue.  HENT:  Negative for congestion, ear discharge and sinus pressure.   Eyes:  Negative for discharge.  Respiratory:  Negative for cough.   Cardiovascular:  Negative for chest pain.  Gastrointestinal:  Negative for abdominal pain and diarrhea.  Genitourinary:  Negative for frequency and hematuria.  Musculoskeletal:  Negative for back pain.  Skin:  Negative for rash.  Neurological:  Positive for weakness. Negative for seizures and headaches.  Psychiatric/Behavioral:  Negative for hallucinations.     Physical Exam Updated Vital Signs BP 122/75   Pulse 83  Temp 97.9 F (36.6 C)   Ht 5\' 5"  (1.651 m)   Wt 47.2 kg   SpO2 100%   BMI 17.31 kg/m  Physical Exam Vitals and nursing note reviewed.  Constitutional:      Appearance: She is well-developed.  HENT:     Head: Normocephalic.     Nose: Nose normal.  Eyes:     General: No scleral icterus.    Conjunctiva/sclera: Conjunctivae normal.  Neck:     Thyroid: No thyromegaly.  Cardiovascular:     Rate and Rhythm: Normal rate and regular rhythm.     Heart sounds: No murmur heard.    No friction rub. No gallop.   Pulmonary:     Breath sounds: No stridor. No wheezing or rales.  Chest:     Chest wall: No tenderness.  Abdominal:     General: There is no distension.     Tenderness: There is no abdominal tenderness. There is no rebound.  Musculoskeletal:     Cervical back: Neck supple.     Comments: Weakness right arm right leg  Lymphadenopathy:     Cervical: No cervical adenopathy.  Skin:    Findings: No erythema or rash.  Neurological:     Mental Status: She is oriented to person, place, and time.     Motor: No abnormal muscle tone.     Coordination: Coordination normal.  Psychiatric:        Behavior: Behavior normal.     ED Results / Procedures / Treatments   Labs (all labs ordered are listed, but only abnormal results are displayed) Labs Reviewed  BASIC METABOLIC PANEL - Abnormal; Notable for the following components:      Result Value   CO2 21 (*)    All other components within normal limits  CBC - Abnormal; Notable for the following components:   RDW 15.9 (*)    All other components within normal limits  URINALYSIS, ROUTINE W REFLEX MICROSCOPIC - Abnormal; Notable for the following components:   Ketones, ur 20 (*)    Protein, ur 100 (*)    All other components within normal limits  CBG MONITORING, ED    EKG None  Radiology DG Chest Port 1 View  Result Date: 10/09/2022 CLINICAL DATA:  67 year old female with a history of left lung cancer, metastatic disease. Weakness. EXAM: PORTABLE CHEST 1 VIEW COMPARISON:  Portable chest 07/09/2022 and earlier. FINDINGS: Portable AP view at 0914 hours. Larger lung volumes. Visualized tracheal air column is within normal limits. Chronic pulmonary interstitial thickening bilaterally. Allowing for portable technique the lungs are clear, with a left infrahilar mass demonstrated by CT in April previously radiographically occult. Mediastinal contours also remains stable and within normal limits. No pneumothorax, pleural effusion or new pulmonary  opacity. Stable visualized osseous structures. Negative visible bowel gas. IMPRESSION: 1. Previous radiographically occult left central lung mass (see CT 06/28/2022). 2.  No new cardiopulmonary abnormality identified. Electronically Signed   By: Odessa Fleming M.D.   On: 10/09/2022 09:53   CT Head Wo Contrast  Result Date: 10/09/2022 CLINICAL DATA:  67 year old female with history of left lower lobe lung cancer. History of brain metastases, previous whole brain radiation and SRS. Neurologic deficit. EXAM: CT HEAD WITHOUT CONTRAST TECHNIQUE: Contiguous axial images were obtained from the base of the skull through the vertex without intravenous contrast. RADIATION DOSE REDUCTION: This exam was performed according to the departmental dose-optimization program which includes automated exposure control, adjustment of the mA and/or kV according to  patient size and/or use of iterative reconstruction technique. COMPARISON:  Head CT 09/28/2022 and earlier. FINDINGS: Brain: Ongoing left hemisphere confluent white matter hypodensity compatible with vasogenic edema. Continued intracranial mass effect. Rightward midline shift of 11 mm and downward mass effect on the left lateral ventricle appears stable from earlier this month. Basilar cisterns remain patent. Right lateral ventricle size is stable from a January restaging MRI. No acute ventriculomegaly. No acute intracranial hemorrhage identified. No cortically based acute infarct identified. Vascular: Mild Calcified atherosclerosis at the skull base. Skull: No destructive osseous lesion identified. Sinuses/Orbits: Visualized paranasal sinuses and mastoids are stable and well aerated. Other: Disconjugate gaze. Visualized scalp soft tissues are within normal limits. IMPRESSION: 1. Extensive left hemisphere vasogenic edema and intracranial mass effect or stable since 09/28/2022. Rightward midline shift of 11 mm and partially effaced left lateral ventricle. 2. No new intracranial  abnormality identified. Electronically Signed   By: Odessa Fleming M.D.   On: 10/09/2022 09:44    Procedures Procedures    Medications Ordered in ED Medications - No data to display  ED Course/ Medical Decision Making/ A&P                             Medical Decision Making Amount and/or Complexity of Data Reviewed Labs: ordered. Radiology: ordered.  This patient presents to the ED for concern of fall, this involves an extensive number of treatment options, and is a complaint that carries with it a high risk of complications and morbidity.  The differential diagnosis includes stroke, worsening cancer   Co morbidities that complicate the patient evaluation  Lung cancer brain mets   Additional history obtained:  Additional history obtained from patient External records from outside source obtained and reviewed including hospital records   Lab Tests:  I Ordered, and personally interpreted labs.  The pertinent results include: White count 5.3   Imaging Studies ordered:  I ordered imaging studies including CT head I independently visualized and interpreted imaging which showed vasogenic edema with intracranial mass.  Unchanged since July 2 I agree with the radiologist interpretation   Cardiac Monitoring: / EKG:  The patient was maintained on a cardiac monitor.  I personally viewed and interpreted the cardiac monitored which showed an underlying rhythm of: Normal sinus rhythm   Consultations Obtained:  No consultant   Problem List / ED Course / Critical interventions / Medication management  Weakness and metastatic cancer No medicines ordered Reevaluation of the patient after these medicines showed that the patient stayed the same I have reviewed the patients home medicines and have made adjustments as needed   Social Determinants of Health:  None   Test / Admission - Considered:  None  Patient with lung cancer and brain mets with general weakness and a fall.   CT head shows no significant changes.  Patient with some dehydration.  She will go back home with her daughter and they will make sure hospice comes to her daughter's house        Final Clinical Impression(s) / ED Diagnoses Final diagnoses:  Fall, initial encounter  Weakness    Rx / DC Orders ED Discharge Orders     None         Bethann Berkshire, MD 10/11/22 1707

## 2022-10-11 DIAGNOSIS — C7931 Secondary malignant neoplasm of brain: Secondary | ICD-10-CM | POA: Diagnosis not present

## 2022-10-11 DIAGNOSIS — R531 Weakness: Secondary | ICD-10-CM | POA: Diagnosis not present

## 2022-10-11 DIAGNOSIS — E440 Moderate protein-calorie malnutrition: Secondary | ICD-10-CM | POA: Diagnosis not present

## 2022-11-11 DIAGNOSIS — E440 Moderate protein-calorie malnutrition: Secondary | ICD-10-CM | POA: Diagnosis not present

## 2022-11-11 DIAGNOSIS — C7931 Secondary malignant neoplasm of brain: Secondary | ICD-10-CM | POA: Diagnosis not present

## 2022-11-11 DIAGNOSIS — R531 Weakness: Secondary | ICD-10-CM | POA: Diagnosis not present

## 2022-11-28 DEATH — deceased

## 2022-12-09 NOTE — Progress Notes (Signed)
  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       Indication for treatment:  Palliation       Radiation treatment dates:   07/01/22 - 07/05/22  Site/dose:   The obstructing lung mass received 9 Gy of a planned 30 Gy, in 3 fractions of 3 Gy each.   Beams/energy:   A 3D field set-up was employed with 6 MV X-rays  Narrative: The patient tolerated radiation treatment relatively well but elected to discontinue treatment after 3 of a planned 10 treatments.  Plan: The patient will receive a call in about one month from the radiation oncology department. She will continue follow up with the palliative care team as well. . ________________________________  Artist Pais Kathrynn Running, M.D.

## 2022-12-31 ENCOUNTER — Encounter: Payer: Self-pay | Admitting: Radiation Therapy

## 2022-12-31 NOTE — Progress Notes (Signed)
Sandra Horne - Mrs. Sandra Horne, age 67, passed away Wednesday, 11-13-22 at her residence.  Public viewing will begin Thursday, November 11, 2022 from 5 until 8 p.m. at [facility], [address] [place], [place].  You may also view on Friday, November 12, 2022 from 9 a.m. until the time of service.  Funeral services will be held Friday, November 12, 2022 at 2 p.m. at [facility], [address] [place], [place]. The family visitation will begin at 1:30 p.m., Dr. Randa Lynn will officiating.  You may live stream the service via [email or web address]  Sandra Horne was born on 1956-02-02 in [place] to [place] and [employer].   She was educated in the [employer].  For 20 years she worked at [employer].  In her spare time, she loved dancing, cooking and playing cards "spades".  In addition to her parents, she was preceded in death by her son, Sandra Horne and her sister, Sandra Horne.  Survivors include husband, Sandra Horne, Sr.; son, Sandra Horne; daughter, Sandra Horne; Sandra Mussel [employer]. ([name]); stepdaughters, Sandra Horne and Sandra Felty [place]); brothers, Sandra Batty [facility] Sandra), Sandra [facility] ([place]), Sandra Ryan [place] ([place]) and Sandra Horne; sisters, [name], Gloriajean [place] and [name]); grandchildren, [place], [place], Barbaraann Barthel, [place], Sandria Bales, [place], [place], [place] and [place]; great-grandchildren, Katy Fitch and [place]; sisters-in-law, Effie Koran, Eveline Keto and Dora Oshel; brothers-in-law, Kathlene November Liberto [place]ined[place]), Launa Grill Erskine Squibb), Celine Mans [place]) and Loyal Jacobson.  Service by [employer].

## 2023-05-05 NOTE — Telephone Encounter (Signed)
 Error
